# Patient Record
Sex: Female | Born: 1940 | Race: White | Hispanic: No | Smoking: Never smoker
Health system: Southern US, Community
[De-identification: ages and names within clinical notes are randomized; demographics above are authoritative.]

## PROBLEM LIST (undated history)

## (undated) DIAGNOSIS — E039 Hypothyroidism, unspecified: Secondary | ICD-10-CM

## (undated) DIAGNOSIS — I5032 Chronic diastolic (congestive) heart failure: Secondary | ICD-10-CM

## (undated) DIAGNOSIS — I272 Pulmonary hypertension, unspecified: Secondary | ICD-10-CM

## (undated) DIAGNOSIS — I2510 Atherosclerotic heart disease of native coronary artery without angina pectoris: Secondary | ICD-10-CM

## (undated) DIAGNOSIS — I350 Nonrheumatic aortic (valve) stenosis: Secondary | ICD-10-CM

## (undated) DIAGNOSIS — E1151 Type 2 diabetes mellitus with diabetic peripheral angiopathy without gangrene: Secondary | ICD-10-CM

## (undated) DIAGNOSIS — I700 Atherosclerosis of aorta: Secondary | ICD-10-CM

## (undated) DIAGNOSIS — I119 Hypertensive heart disease without heart failure: Secondary | ICD-10-CM

## (undated) DIAGNOSIS — I509 Heart failure, unspecified: Secondary | ICD-10-CM

## (undated) HISTORY — PX: DILATION AND CURETTAGE OF UTERUS: SHX78

## (undated) HISTORY — DX: Atherosclerosis of aorta: I70.0

## (undated) HISTORY — DX: Nonrheumatic aortic (valve) stenosis: I35.0

## (undated) HISTORY — PX: APPENDECTOMY: SHX54

## (undated) HISTORY — DX: Chronic diastolic (congestive) heart failure: I50.32

## (undated) HISTORY — DX: Hypertensive heart disease without heart failure: I11.9

## (undated) HISTORY — DX: Pulmonary hypertension, unspecified: I27.20

## (undated) HISTORY — DX: Hypothyroidism, unspecified: E03.9

## (undated) HISTORY — DX: Atherosclerotic heart disease of native coronary artery without angina pectoris: I25.10

## (undated) HISTORY — DX: Type 2 diabetes mellitus with diabetic peripheral angiopathy without gangrene: E11.51

## (undated) HISTORY — PX: ANKLE SURGERY: SHX546

---

## 2010-04-19 DIAGNOSIS — N183 Chronic kidney disease, stage 3 unspecified: Secondary | ICD-10-CM | POA: Insufficient documentation

## 2012-11-12 DIAGNOSIS — K219 Gastro-esophageal reflux disease without esophagitis: Secondary | ICD-10-CM | POA: Insufficient documentation

## 2014-05-20 DIAGNOSIS — I739 Peripheral vascular disease, unspecified: Secondary | ICD-10-CM | POA: Insufficient documentation

## 2014-06-05 DIAGNOSIS — E1159 Type 2 diabetes mellitus with other circulatory complications: Secondary | ICD-10-CM | POA: Insufficient documentation

## 2014-07-14 DIAGNOSIS — E1151 Type 2 diabetes mellitus with diabetic peripheral angiopathy without gangrene: Secondary | ICD-10-CM

## 2014-07-14 HISTORY — DX: Type 2 diabetes mellitus with diabetic peripheral angiopathy without gangrene: E11.51

## 2016-04-11 DIAGNOSIS — I872 Venous insufficiency (chronic) (peripheral): Secondary | ICD-10-CM | POA: Insufficient documentation

## 2016-05-17 DIAGNOSIS — I5189 Other ill-defined heart diseases: Secondary | ICD-10-CM | POA: Insufficient documentation

## 2016-05-17 DIAGNOSIS — I517 Cardiomegaly: Secondary | ICD-10-CM | POA: Insufficient documentation

## 2016-05-17 DIAGNOSIS — I342 Nonrheumatic mitral (valve) stenosis: Secondary | ICD-10-CM | POA: Insufficient documentation

## 2016-09-22 DIAGNOSIS — H2512 Age-related nuclear cataract, left eye: Secondary | ICD-10-CM | POA: Insufficient documentation

## 2017-07-04 DIAGNOSIS — I272 Pulmonary hypertension, unspecified: Secondary | ICD-10-CM | POA: Insufficient documentation

## 2017-07-04 HISTORY — DX: Pulmonary hypertension, unspecified: I27.20

## 2017-07-06 DIAGNOSIS — B369 Superficial mycosis, unspecified: Secondary | ICD-10-CM | POA: Insufficient documentation

## 2017-08-28 ENCOUNTER — Encounter: Payer: Self-pay | Admitting: Cardiology

## 2017-08-28 ENCOUNTER — Other Ambulatory Visit: Payer: Self-pay | Admitting: Cardiology

## 2017-08-28 ENCOUNTER — Ambulatory Visit
Admission: RE | Admit: 2017-08-28 | Discharge: 2017-08-28 | Disposition: A | Discharge status: Home / Self Care | Payer: Medicare Other | Source: Ambulatory Visit | Attending: Cardiology | Admitting: Cardiology

## 2017-08-28 DIAGNOSIS — I700 Atherosclerosis of aorta: Secondary | ICD-10-CM

## 2017-08-28 DIAGNOSIS — R0602 Shortness of breath: Secondary | ICD-10-CM

## 2017-08-28 DIAGNOSIS — I119 Hypertensive heart disease without heart failure: Secondary | ICD-10-CM

## 2017-08-28 DIAGNOSIS — I2510 Atherosclerotic heart disease of native coronary artery without angina pectoris: Secondary | ICD-10-CM

## 2017-08-28 DIAGNOSIS — I5032 Chronic diastolic (congestive) heart failure: Secondary | ICD-10-CM

## 2017-08-28 DIAGNOSIS — I350 Nonrheumatic aortic (valve) stenosis: Secondary | ICD-10-CM

## 2017-08-30 ENCOUNTER — Encounter: Payer: Self-pay | Admitting: Cardiology

## 2017-08-30 DIAGNOSIS — E039 Hypothyroidism, unspecified: Secondary | ICD-10-CM

## 2017-08-30 DIAGNOSIS — I350 Nonrheumatic aortic (valve) stenosis: Secondary | ICD-10-CM

## 2017-08-30 DIAGNOSIS — I119 Hypertensive heart disease without heart failure: Secondary | ICD-10-CM

## 2017-08-30 DIAGNOSIS — R0600 Dyspnea, unspecified: Secondary | ICD-10-CM | POA: Insufficient documentation

## 2017-08-30 DIAGNOSIS — I5032 Chronic diastolic (congestive) heart failure: Secondary | ICD-10-CM

## 2017-08-30 DIAGNOSIS — I2510 Atherosclerotic heart disease of native coronary artery without angina pectoris: Secondary | ICD-10-CM

## 2017-08-30 DIAGNOSIS — I700 Atherosclerosis of aorta: Secondary | ICD-10-CM

## 2017-08-30 HISTORY — DX: Hypertensive heart disease without heart failure: I11.9

## 2017-08-30 HISTORY — DX: Hypothyroidism, unspecified: E03.9

## 2017-08-30 HISTORY — DX: Atherosclerotic heart disease of native coronary artery without angina pectoris: I25.10

## 2017-08-30 HISTORY — DX: Atherosclerosis of aorta: I70.0

## 2017-08-30 HISTORY — DX: Chronic diastolic (congestive) heart failure: I50.32

## 2017-08-30 HISTORY — DX: Nonrheumatic aortic (valve) stenosis: I35.0

## 2017-08-30 NOTE — H&P (View-Only) (Signed)
Alisha [name] E  Date of visit:  08/28/2017 DOB:  06-12-1941    Age:  76 yrs. Medical record number:  [id number]     Account number:  [id number] Primary Care Provider: Neila Gear A ____________________________ CURRENT DIAGNOSES  1. Secondary pulmonary hypertension  2. Aortic valve stenosis  3. Atherosclerosis of aorta  4. Peripheral Vascular Disease, Unspecified  5. Hypertensive heart disease without heart failure  6. CAD Native without angina  7. Dyspnea  8. Type 2 diabetes mellitus without complications  9. Hypothyroidism  10. Chronic kidney disease, stage 3 (moderate)  11. Family history of ischemic heart disease and other diseases of the circulatory system ____________________________ ALLERGIES  No Known Drug Allergies ____________________________ MEDICATIONS  1. amlodipine 5 mg tablet, 1 p.o. daily  2. atorvastatin 20 mg tablet, 1 p.o. daily  3. carvedilol 12.5 mg tablet, BID  4. cetirizine 10 mg tablet, 1 p.o. daily  5. clopidogrel 75 mg tablet, 1 p.o. daily  6. fenofibrate 160 mg tablet, 1 p.o. daily  7. furosemide 20 mg tablet, 2 p.o. daily  8. glimepiride 4 mg tablet, 1 p.o. daily  9. levothyroxine 125 mcg tablet, 1 p.o. daily  10. losartan 100 mg tablet, 1 p.o. daily  11. metformin ER 500 mg 24 hr tablet,extended release, 1 to 2 tablets QD  12. pantoprazole 40 mg tablet,delayed release, 1 p.o. daily  13. PreserVision AREDS 7,160 unit-113 mg-100 unit tablet, 1 p.o. daily ____________________________ HISTORY OF PRESENT ILLNESS This very nice 76 year old female is seen for evaluation of severe dyspnea. The patient has a long-standing history of hypertension and diabetes. She has a history of chronic venous insufficiency and had a DVT of the left lower extremity treated with Coumadin in 1995. She developed a left ankle fracture requiring surgical fixation and then had the hardware removed. She developed ulcers on her left lateral ankle at the site of her previous  orthopedic procedure scar and had reasonable ABIs in 2015. She later because of poor wound healing and eventually had to have hyperbaric therapy and eventually had healing. A CT angiogram in October 2015 showed mild right SFA disease with a right anterior tibial patent but small vessels and evidence of emboli the posterior tibial at the ankle. She also had small vessel disease in her feet. She had some venous reflux disease with superficial reflux and eventually had endovascular therapy of the right greater saphenous vein in December 2017. Her wound ulcers eventually healed. She was able to mow her grass and do normal activities with walking about 2 years ago. The summer of 2017 she started developing severe dyspnea and was seen by a cardiologist in [place]. An echocardiogram at that time showed moderate pulmonary hypertension and a BNP level was 1400. She had a myocardial perfusion scan that showed no ischemia done with medicine. She has since been seen by the cardiology PA. She had a couple episodes of syncope. One occurred after she was walking around outside and when she became severely short of breath she then had a syncopal episode. She had a repeat echo done at that time as well as were any event monitor that didn't show any arrhythmias. She also had a repeat myocardial perfusion scan. She is continued to complain of dyspnea. Most recently in October she was coming back from an airport and had a syncopal episode accompanied with severe shortness of breath or she fell into a chair at the airport terminal. She since then has become severely short of breath with most  any level of activity and will get tightness across her upper chest. A relative of hers asked if I would see her and after reviewing the records and seeing her in consultation today. She is quite limited as what she can do and has gained about 10 pounds of weight and continues to have edema. She is on a small dose of furosemide. She  occasionally will have some orthopnea and again as noted above is severely dyspneic with most any of her exertional become dizzy and has to syncopal episode as noted above. On a most recent consultation by the cardiology PA a statement was made that she did not have any risk factors for CAD and that no cause for cardiac reason could be found for her dyspnea. ____________________________ PAST HISTORY  Past Medical Illnesses:  hypertension, DM-non-insulin dependent, hyperlipidemia, irritable bowel syndrome, peripheral vascular disease, anemia, chronic kidney disease Stage 3, diverticulitis;  Cardiovascular Illnesses:  no previous history of cardiac disease;  Infectious Diseases:  no previous history of significant infectious diseases;  Surgical Procedures:  appendectomy, D and C, tubal ligation, left ankle surgery;  Trauma History:  no previous history of significant trauma;  NYHA Classification:  I;  Cardiology Procedures-Invasive:  no previous interventional or invasive cardiology procedures;  Cardiology Procedures-Noninvasive:  echocardiogram 2018, lexiscan Myoview 2018;  Peripheral Vascular Procedures:  no previous invasive peripheral vascular procedures.;  LVEF not documented,   ____________________________ CARDIO-PULMONARY TEST DATES EKG Date:  08/28/2017;   ____________________________ FAMILY HISTORY Brother -- Heart Attack, Heart disease, Myocardial infarction, Brother alive with problem, Premature coronary heart disease Father -- Heart disease, Cancer, Myocardial infarction, Father dead Mother -- Diabetes mellitus, Mother dead Sister -- Sister alive with problem, Diabetes mellitus type 2 ____________________________ SOCIAL HISTORY Alcohol Use:  rare;  Smoking:  never smoked;  Diet:  regular diet;  Lifestyle:  divorced;  Exercise:  no regular exercise;  Occupation:  Cabin crew;  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 Eyes: cataract extraction bilaterally, macular hole Ears, Nose, Throat, Mouth:  partial hearing loss, hearing aides Respiratory: dyspnea with exertion Cardiovascular:  please review HPI Abdominal: diarrheaGenitourinary-Female: frequency Musculoskeletal:  ulcers on legs Neurological:  denies headaches, stroke, or TIA Psychiatric:  denies depression or anxiety Hematological/Immunologic:  denies any food allergies, bleeding disorders. ____________________________ PHYSICAL EXAMINATION VITAL SIGNS  Blood Pressure:  120/54 Sitting, Right arm, regular cuff  , 130/60 Standing, Right arm and regular cuff   Pulse:  84/min. Weight:  172.00 lbs. Height:  65.5"BMI: 28  Constitutional:  cooperative, alert and oriented,well developed, well nourished, in no acute distress. Skin:  warm and dry to touch, no apparent skin lesions, or masses noted. Head:  normocephalic, normal hair pattern, no masses or tenderness Eyes:  EOMS Intact, PERRLA, C and S clear, Funduscopic exam not done. ENT:  ears, nose and throat reveal no gross abnormalities.  Dentition good. Neck:  supple, without massess. No JVD, thyromegaly or carotid bruits. Carotid upstroke normal. Chest:  normal symmetry, clear to auscultation. Cardiac:  regular rhythm, normal S1 and S2, No S3 or S4, no murmurs, gallops or rubs detected. Abdomen:  abdomen soft,non-tender, no masses, no hepatospenomegaly, or aneurysm noted Peripheral Pulses:  the femoral,dorsalis pedis, and posterior tibial pulses are full and equal bilaterally with no bruits auscultated. Extremities & Back:  no deformities, clubbing, cyanosis, erythema or edema observed. Normal muscle strength and tone. Neurological:  no gross motor or sensory  deficits noted, affect appropriate, oriented x3. ____________________________ MOST RECENT LIPID PANEL 07/01/17  CHOL TOTL 111 mg/dl, LDL 47 NM, HDL 47 mg/dl, TRIGLYCER 83 mg/dl and VLDL  17 ____________________________ IMPRESSIONS/PLAN  1. Dyspnea is likely multifactorial and due partially to pulmonary hypertension of undetermined etiology at this time and could also be an ischemic equivalent in light of her multiple risk factors could have an element of diastolic heart failure could have valvular heart disease with aortic stenosis/mitral stenosis. 2. Aortic stenosis severity undetermined at this time but at least moderate 3. 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 Hypertensive heart disease 5. 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 History of nonhealing ulcers of lower extremities 7. Chronic venous insufficiency 8. Pulmonary hypertension undetermined etiology with evidence of trivial regurgitation volume overload 9. Sleep apnea 10. 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 Coronary artery disease as well as aortic atherosclerosis noted previously on imaging 12. Syncope of undetermined etiology that is exertional  Recommendations:  Echocardiogram was done today and shows at least moderate aortic stenosis. She has good LV function with moderate LVH and grade 2 diastolic dysfunction. Her ejection fraction was around 60-65%. Right ventricle is dilated and the right atrium is dilated and she has moderate pulmonary hypertension at least and has septal flattening suggesting it could be severe. She also had evidence of right atrial enlargement and right atrial pressure abnormality with neck vein distention.  I think her dyspnea is cardiac and am awaiting the results of the lab work drawn today. She is also to have a chest x-ray today. I recommended that she increase her furosemide to 40 mg twice daily and that she weigh daily. She should restrict her fluids to 2 L per day. I would like her to have a set of lower extremity arterial Dopplers. She likely will require  cardiac catheterization for further assessment. At this point is difficult to tell if the pulmonary hypertension is primary or secondary to either diastolic dysfunction, mitral valve disease or aortic stenosis. She also has evidence on previous vascular studies of aortic atherosclerosis as well as calcification in her coronaries so the extent of her CAD has not been determined yet.  ____________________________ TODAYS ORDERS  1. 2D, color flow, doppler: First Available  2. Comprehensive Metabolic Panel: Today  3. Complete Blood Count: Today  4. BNP: Today  5. TSH: Today  6. 12 Lead EKG: Today  7. Chest X-ray PA/Lat: today  8. Return Visit: 2 weeks  9. CHEST XRAY: Today  10. Bil L.E. Arterial Duplex: First Available  11. ROV 2 weeks                       ____________________________ Cardiology Physician:  Kerry Hough MD [facility]

## 2017-08-30 NOTE — Consult Note (Signed)
Alisha [name]  Date of visit:  08/28/2017 DOB:  06-28-1941    Age:  76 yrs. Medical record number:  [id number]     Account number:  [id number] Primary Care Provider: Neila Gear A ____________________________ CURRENT DIAGNOSES  1. Secondary pulmonary hypertension  2. Aortic valve stenosis  3. Atherosclerosis of aorta  4. Peripheral Vascular Disease, Unspecified  5. Hypertensive heart disease without heart failure  6. CAD Native without angina  7. Dyspnea  8. Type 2 diabetes mellitus without complications  9. Hypothyroidism  10. Chronic kidney disease, stage 3 (moderate)  11. Family history of ischemic heart disease and other diseases of the circulatory system ____________________________ ALLERGIES  No Known Drug Allergies ____________________________ MEDICATIONS  1. amlodipine 5 mg tablet, 1 p.o. daily  2. atorvastatin 20 mg tablet, 1 p.o. daily  3. carvedilol 12.5 mg tablet, BID  4. cetirizine 10 mg tablet, 1 p.o. daily  5. clopidogrel 75 mg tablet, 1 p.o. daily  6. fenofibrate 160 mg tablet, 1 p.o. daily  7. furosemide 20 mg tablet, 2 p.o. daily  8. glimepiride 4 mg tablet, 1 p.o. daily  9. levothyroxine 125 mcg tablet, 1 p.o. daily  10. losartan 100 mg tablet, 1 p.o. daily  11. metformin ER 500 mg 24 hr tablet,extended release, 1 to 2 tablets QD  12. pantoprazole 40 mg tablet,delayed release, 1 p.o. daily  13. PreserVision AREDS 7,160 unit-113 mg-100 unit tablet, 1 p.o. daily ____________________________ HISTORY OF PRESENT ILLNESS This very nice 76 year old female is seen for evaluation of severe dyspnea. The patient has a long-standing history of hypertension and diabetes. She has a history of chronic venous insufficiency and had a DVT of the left lower extremity treated with Coumadin in 1995. She developed a left ankle fracture requiring surgical fixation and then had the hardware removed. She developed ulcers on her left lateral ankle at the site of her previous  orthopedic procedure scar and had reasonable ABIs in 2015. She later because of poor wound healing and eventually had to have hyperbaric therapy and eventually had healing. A CT angiogram in October 2015 showed mild right SFA disease with a right anterior tibial patent but small vessels and evidence of emboli the posterior tibial at the ankle. She also had small vessel disease in her feet. She had some venous reflux disease with superficial reflux and eventually had endovascular therapy of the right greater saphenous vein in December 2017. Her wound ulcers eventually healed. She was able to mow her grass and do normal activities with walking about 2 years ago. The summer of 2017 she started developing severe dyspnea and was seen by a cardiologist in [place]. An echocardiogram at that time showed moderate pulmonary hypertension and a BNP level was 1400. She had a myocardial perfusion scan that showed no ischemia done with medicine. She has since been seen by the cardiology PA. She had a couple episodes of syncope. One occurred after she was walking around outside and when she became severely short of breath she then had a syncopal episode. She had a repeat echo done at that time as well as were any event monitor that didn't show any arrhythmias. She also had a repeat myocardial perfusion scan. She is continued to complain of dyspnea. Most recently in October she was coming back from an airport and had a syncopal episode accompanied with severe shortness of breath or she fell into a chair at the airport terminal. She since then has become severely short of breath with most  any level of activity and will get tightness across her upper chest. A relative of hers asked if I would see her and after reviewing the records and seeing her in consultation today. She is quite limited as what she can do and has gained about 10 pounds of weight and continues to have edema. She is on a small dose of furosemide. She  occasionally will have some orthopnea and again as noted above is severely dyspneic with most any of her exertional become dizzy and has to syncopal episode as noted above. On a most recent consultation by the cardiology PA a statement was made that she did not have any risk factors for CAD and that no cause for cardiac reason could be found for her dyspnea. ____________________________ PAST HISTORY  Past Medical Illnesses:  hypertension, DM-non-insulin dependent, hyperlipidemia, irritable bowel syndrome, peripheral vascular disease, anemia, chronic kidney disease Stage 3, diverticulitis;  Cardiovascular Illnesses:  no previous history of cardiac disease;  Infectious Diseases:  no previous history of significant infectious diseases;  Surgical Procedures:  appendectomy, D and C, tubal ligation, left ankle surgery;  Trauma History:  no previous history of significant trauma;  NYHA Classification:  I;  Cardiology Procedures-Invasive:  no previous interventional or invasive cardiology procedures;  Cardiology Procedures-Noninvasive:  echocardiogram 2018, lexiscan Myoview 2018;  Peripheral Vascular Procedures:  no previous invasive peripheral vascular procedures.;  LVEF not documented,   ____________________________ CARDIO-PULMONARY TEST DATES EKG Date:  08/28/2017;   ____________________________ FAMILY HISTORY Brother -- Heart Attack, Heart disease, Myocardial infarction, Brother alive with problem, Premature coronary heart disease Father -- Heart disease, Cancer, Myocardial infarction, Father dead Mother -- Diabetes mellitus, Mother dead Sister -- Sister alive with problem, Diabetes mellitus type 2 ____________________________ SOCIAL HISTORY Alcohol Use:  rare;  Smoking:  never smoked;  Diet:  regular diet;  Lifestyle:  divorced;  Exercise:  no regular exercise;  Occupation:  Cabin crew;  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 Eyes: cataract extraction bilaterally, macular hole Ears, Nose, Throat, Mouth:  partial hearing loss, hearing aides Respiratory: dyspnea with exertion Cardiovascular:  please review HPI Abdominal: diarrheaGenitourinary-Female: frequency Musculoskeletal:  ulcers on legs Neurological:  denies headaches, stroke, or TIA Psychiatric:  denies depression or anxiety Hematological/Immunologic:  denies any food allergies, bleeding disorders. ____________________________ PHYSICAL EXAMINATION VITAL SIGNS  Blood Pressure:  120/54 Sitting, Right arm, regular cuff  , 130/60 Standing, Right arm and regular cuff   Pulse:  84/min. Weight:  172.00 lbs. Height:  65.5"BMI: 28  Constitutional:  cooperative, alert and oriented,well developed, well nourished, in no acute distress. Skin:  warm and dry to touch, no apparent skin lesions, or masses noted. Head:  normocephalic, normal hair pattern, no masses or tenderness Eyes:  EOMS Intact, PERRLA, C and S clear, Funduscopic exam not done. ENT:  ears, nose and throat reveal no gross abnormalities.  Dentition good. Neck:  supple, without massess. No JVD, thyromegaly or carotid bruits. Carotid upstroke normal. Chest:  normal symmetry, clear to auscultation. Cardiac:  regular rhythm, normal S1 and S2, No S3 or S4, no murmurs, gallops or rubs detected. Abdomen:  abdomen soft,non-tender, no masses, no hepatospenomegaly, or aneurysm noted Peripheral Pulses:  the femoral,dorsalis pedis, and posterior tibial pulses are full and equal bilaterally with no bruits auscultated. Extremities & Back:  no deformities, clubbing, cyanosis, erythema or edema observed. Normal muscle strength and tone. Neurological:  no gross motor or sensory  deficits noted, affect appropriate, oriented x3. ____________________________ MOST RECENT LIPID PANEL 07/01/17  CHOL TOTL 111 mg/dl, LDL 47 NM, HDL 47 mg/dl, TRIGLYCER 83 mg/dl and VLDL  17 ____________________________ IMPRESSIONS/PLAN  1. Dyspnea is likely multifactorial and due partially to pulmonary hypertension of undetermined etiology at this time and could also be an ischemic equivalent in light of her multiple risk factors could have an element of diastolic heart failure could have valvular heart disease with aortic stenosis/mitral stenosis. 2. Aortic stenosis severity undetermined at this time but at least moderate 3. 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 Hypertensive heart disease 5. 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 History of nonhealing ulcers of lower extremities 7. Chronic venous insufficiency 8. Pulmonary hypertension undetermined etiology with evidence of trivial regurgitation volume overload 9. Sleep apnea 10. 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 Coronary artery disease as well as aortic atherosclerosis noted previously on imaging 12. Syncope of undetermined etiology that is exertional  Recommendations:  Echocardiogram was done today and shows at least moderate aortic stenosis. She has good LV function with moderate LVH and grade 2 diastolic dysfunction. Her ejection fraction was around 60-65%. Right ventricle is dilated and the right atrium is dilated and she has moderate pulmonary hypertension at least and has septal flattening suggesting it could be severe. She also had evidence of right atrial enlargement and right atrial pressure abnormality with neck vein distention.  I think her dyspnea is cardiac and am awaiting the results of the lab work drawn today. She is also to have a chest x-ray today. I recommended that she increase her furosemide to 40 mg twice daily and that she weigh daily. She should restrict her fluids to 2 L per day. I would like her to have a set of lower extremity arterial Dopplers. She likely will require  cardiac catheterization for further assessment. At this point is difficult to tell if the pulmonary hypertension is primary or secondary to either diastolic dysfunction, mitral valve disease or aortic stenosis. She also has evidence on previous vascular studies of aortic atherosclerosis as well as calcification in her coronaries so the extent of her CAD has not been determined yet.  ____________________________ TODAYS ORDERS  1. 2D, color flow, doppler: First Available  2. Comprehensive Metabolic Panel: Today  3. Complete Blood Count: Today  4. BNP: Today  5. TSH: Today  6. 12 Lead EKG: Today  7. Chest X-ray PA/Lat: today  8. Return Visit: 2 weeks  9. CHEST XRAY: Today  10. Bil L.[name]. Arterial Duplex: First Available  11. ROV 2 weeks                       ____________________________ Cardiology Physician:  Kerry Hough MD [facility]

## 2017-09-13 NOTE — Progress Notes (Signed)
Alisha [name] E  Date of visit:  09/13/2017 DOB:  1940-10-14    Age:  77 yrs. Medical record number:  [id number]     Account number:  [id number] Primary Care Provider: Neila Gear A ____________________________ CURRENT DIAGNOSES  1. Secondary pulmonary hypertension  2. Aortic valve stenosis  3. Atherosclerosis of aorta  4. Peripheral Vascular Disease, Unspecified  5. Hypertensive heart disease without heart failure  6. CAD Native without angina  7. Dyspnea  8. Type 2 diabetes mellitus without complications  9. Hypothyroidism  10. Chronic kidney disease, stage 3 (moderate)  11. Family history of ischemic heart disease and other diseases of the circulatory system ____________________________ ALLERGIES  No Known Drug Allergies ____________________________ MEDICATIONS  1. amlodipine 5 mg tablet, 1 p.o. daily  2. atorvastatin 20 mg tablet, 1 p.o. daily  3. carvedilol 12.5 mg tablet, BID  4. cetirizine 10 mg tablet, 1 p.o. daily  5. clopidogrel 75 mg tablet, 1 p.o. daily  6. fenofibrate 160 mg tablet, 1 p.o. daily  7. furosemide 40 mg tablet, BID  8. glimepiride 4 mg tablet, 1 p.o. daily  9. levothyroxine 125 mcg tablet, 1 p.o. daily  10. losartan 100 mg tablet, 1 p.o. daily  11. metformin ER 500 mg 24 hr tablet,extended release, 1 to 2 tablets QD  12. pantoprazole 40 mg tablet,delayed release, 1 p.o. daily  13. PreserVision AREDS 7,160 unit-113 mg-100 unit tablet, 1 p.o. daily ____________________________ HISTORY OF PRESENT ILLNESS Patient returns for cardiac followup. She is still dyspneic with most any level of exertion plains of some chest tightness across her anterior chest. She has lost 10 pounds of weight and her edema has improved. As noted previously her echo showed at least moderate aortic stenosis and there was a question about mitral stenosis. She had evidence of right ventricular enlargement as well as what significant pulmonary hypertension. She has not yet had a set of  lower extremity Dopplers. I spoke with her case with Dr. Sherren Mocha agreeable to take her to the catheterization laboratory for further assessment. She is no longer having orthopnea. She has no PND. ____________________________ PAST HISTORY  Past Medical Illnesses:  hypertension, DM-non-insulin dependent, hyperlipidemia, irritable bowel syndrome, peripheral vascular disease, anemia, chronic kidney disease Stage 3, diverticulitis;  Cardiovascular Illnesses:  no previous history of cardiac disease;  Infectious Diseases:  no previous history of significant infectious diseases;  Surgical Procedures:  appendectomy, D and C, tubal ligation, left ankle surgery;  Trauma History:  no previous history of significant trauma;  NYHA Classification:  I;  Canadian Angina Classification:  Class 0: Asymptomatic;  Cardiology Procedures-Invasive:  no previous interventional or invasive cardiology procedures;  Cardiology Procedures-Noninvasive:  echocardiogram 2018, lexiscan Myoview 2018;  Peripheral Vascular Procedures:  CT scan of aorta;  LVEF not documented,   ____________________________ CARDIO-PULMONARY TEST DATES EKG Date:  08/28/2017;   ____________________________ FAMILY HISTORY Brother -- Heart Attack, Heart disease, Myocardial infarction, Brother alive with problem, Premature coronary heart disease Father -- Heart disease, Cancer, Myocardial infarction, Father dead Mother -- Diabetes mellitus, Mother dead Sister -- Sister alive with problem, Diabetes mellitus type 2 ____________________________ SOCIAL HISTORY Alcohol Use:  rare;  Smoking:  never smoked;  Diet:  regular diet;  Lifestyle:  divorced;  Exercise:  no regular exercise;  Occupation:  Cabin crew;  Residence:  lives alone;   ____________________________ REVIEW OF SYSTEMS General:  malaise and fatigue, weight loss of approximately 10 lbs Eyes: cataract extraction bilaterally, macular hole Ears, Nose, Throat, Mouth:  partial hearing loss,  hearing  aides Respiratory: dyspnea with exertion Cardiovascular:  please review HPI Abdominal: denies dyspepsia, GI bleeding, constipation, or diarrheaGenitourinary-Female: frequency Musculoskeletal:  ulcers on legs Neurological:  denies headaches, stroke, or TIA Psychiatric:  denies depression or anxiety Hematological/Immunologic:  denies any food allergies, bleeding disorders. ____________________________ PHYSICAL EXAMINATION VITAL SIGNS  Blood Pressure:  112/56 Sitting, Left arm, regular cuff  , 116/52 Standing, Left arm and regular cuff   Pulse:  82/min. Weight:  162.00 lbs. Height:  65.50"BMI: 26  Constitutional:  pleasant white female, in no acute distress Skin:  warm and dry to touch, no apparent skin lesions, or masses noted. Head:  normocephalic, normal hair pattern, no masses or tenderness Eyes:  EOMS Intact, PERRLA, C and S clear, Funduscopic exam not done. ENT:  ears, nose and throat reveal no gross abnormalities.  Dentition good. Neck:  supple, without massess. No JVD, thyromegaly or carotid bruits. Carotid upstroke normal. Chest:  normal symmetry, clear to auscultation. Cardiac:  irregular rhythm, normal S1 and S2, grade 2/6 systolic murmur Abdomen:  abdomen soft,non-tender, no masses, no hepatospenomegaly, or aneurysm noted Peripheral Pulses:  the femoral,dorsalis pedis, and posterior tibial pulses are full and equal bilaterally with no bruits auscultated. Extremities & Back:  no spinal abnormalities noted., normal muscle strength and tone., 1+ edema Neurological:  no gross motor or sensory deficits noted, affect appropriate, oriented x3. ____________________________ MOST RECENT LIPID PANEL 07/01/17  CHOL TOTL 111 mg/dl, LDL 47 NM, HDL 47 mg/dl, TRIGLYCER 83 mg/dl and VLDL 17 ____________________________ IMPRESSIONS/PLAN  1. Severe dyspnea on exertion with evidence of pulmonary hypertension 2. Aortic stenosis at least moderate 3. Severe mitral annular calcification with possible  mitral stenosis 4. CAD as manifested by coronary calcification previous CT scan 5. Diabetes mellitus with vascular disease 6. Hypertension  Recommendations:  The patient is agreeable to catheterization.Cardiac catheterization was discussed with the patient including risks of myocardial infarction, death, stroke, bleeding, arrhythmia, dye allergy, or renal insufficiency. She understands and is willing to proceed. She has a previous history of an allergy to metal and does not wish to consider coronary stenting which I think is agreeable so she is just going to be set up for a right and left heart catheterization. We will have her hold her ARB the day prior to catheterization and her metformin and diuretics the day of the catheterization. Followup after the cardiac catheterization about further plans.  ____________________________ TODAYS ORDERS  1. Draw PT/INR: Today  2. Comprehensive Metabolic Panel: Today  3. Complete Blood Count: Today  4. PTT: Today  5. BNP: Today  6. Right and Left Heart Cath: First Available                       ____________________________ Cardiology Physician:  Kerry Hough MD [facility]

## 2017-09-27 ENCOUNTER — Ambulatory Visit (HOSPITAL_COMMUNITY)
Admission: RE | Disposition: A | Discharge status: Home / Self Care | Payer: Self-pay | Source: Ambulatory Visit | Attending: Cardiovascular Disease

## 2017-09-27 ENCOUNTER — Ambulatory Visit (HOSPITAL_COMMUNITY)
Admission: RE | Admit: 2017-09-27 | Discharge: 2017-09-27 | Disposition: A | Discharge status: Home / Self Care | Payer: Medicare Other | Source: Ambulatory Visit | Attending: Cardiovascular Disease | Admitting: Cardiovascular Disease

## 2017-09-27 DIAGNOSIS — I50810 Right heart failure, unspecified: Secondary | ICD-10-CM | POA: Diagnosis not present

## 2017-09-27 DIAGNOSIS — I132 Hypertensive heart and chronic kidney disease with heart failure and with stage 5 chronic kidney disease, or end stage renal disease: Secondary | ICD-10-CM | POA: Diagnosis not present

## 2017-09-27 DIAGNOSIS — E1151 Type 2 diabetes mellitus with diabetic peripheral angiopathy without gangrene: Secondary | ICD-10-CM | POA: Insufficient documentation

## 2017-09-27 DIAGNOSIS — I272 Pulmonary hypertension, unspecified: Secondary | ICD-10-CM | POA: Diagnosis present

## 2017-09-27 DIAGNOSIS — I700 Atherosclerosis of aorta: Secondary | ICD-10-CM | POA: Diagnosis not present

## 2017-09-27 DIAGNOSIS — R0600 Dyspnea, unspecified: Secondary | ICD-10-CM | POA: Diagnosis not present

## 2017-09-27 DIAGNOSIS — I872 Venous insufficiency (chronic) (peripheral): Secondary | ICD-10-CM | POA: Insufficient documentation

## 2017-09-27 DIAGNOSIS — I2510 Atherosclerotic heart disease of native coronary artery without angina pectoris: Secondary | ICD-10-CM | POA: Diagnosis not present

## 2017-09-27 DIAGNOSIS — E1122 Type 2 diabetes mellitus with diabetic chronic kidney disease: Secondary | ICD-10-CM | POA: Diagnosis not present

## 2017-09-27 DIAGNOSIS — N183 Chronic kidney disease, stage 3 (moderate): Secondary | ICD-10-CM | POA: Diagnosis not present

## 2017-09-27 DIAGNOSIS — Z79890 Hormone replacement therapy (postmenopausal): Secondary | ICD-10-CM | POA: Diagnosis not present

## 2017-09-27 DIAGNOSIS — E039 Hypothyroidism, unspecified: Secondary | ICD-10-CM | POA: Diagnosis not present

## 2017-09-27 DIAGNOSIS — I5030 Unspecified diastolic (congestive) heart failure: Secondary | ICD-10-CM | POA: Insufficient documentation

## 2017-09-27 DIAGNOSIS — Z86718 Personal history of other venous thrombosis and embolism: Secondary | ICD-10-CM | POA: Diagnosis not present

## 2017-09-27 DIAGNOSIS — Z7984 Long term (current) use of oral hypoglycemic drugs: Secondary | ICD-10-CM | POA: Diagnosis not present

## 2017-09-27 DIAGNOSIS — Z8249 Family history of ischemic heart disease and other diseases of the circulatory system: Secondary | ICD-10-CM | POA: Insufficient documentation

## 2017-09-27 DIAGNOSIS — G4730 Sleep apnea, unspecified: Secondary | ICD-10-CM | POA: Diagnosis not present

## 2017-09-27 DIAGNOSIS — E1142 Type 2 diabetes mellitus with diabetic polyneuropathy: Secondary | ICD-10-CM | POA: Diagnosis not present

## 2017-09-27 DIAGNOSIS — Z79899 Other long term (current) drug therapy: Secondary | ICD-10-CM | POA: Insufficient documentation

## 2017-09-27 DIAGNOSIS — I080 Rheumatic disorders of both mitral and aortic valves: Secondary | ICD-10-CM | POA: Insufficient documentation

## 2017-09-27 DIAGNOSIS — I1310 Hypertensive heart and chronic kidney disease without heart failure, with stage 1 through stage 4 chronic kidney disease, or unspecified chronic kidney disease: Secondary | ICD-10-CM | POA: Diagnosis not present

## 2017-09-27 DIAGNOSIS — I350 Nonrheumatic aortic (valve) stenosis: Secondary | ICD-10-CM

## 2017-09-27 DIAGNOSIS — R55 Syncope and collapse: Secondary | ICD-10-CM | POA: Insufficient documentation

## 2017-09-27 HISTORY — PX: RIGHT/LEFT HEART CATH AND CORONARY ANGIOGRAPHY: CATH118266

## 2017-09-27 LAB — POCT I-STAT 3, VENOUS BLOOD GAS (G3P V)
ACID-BASE DEFICIT: 5 mmol/L — AB (ref 0.0–2.0)
Acid-base deficit: 4 mmol/L — ABNORMAL HIGH (ref 0.0–2.0)
Acid-base deficit: 5 mmol/L — ABNORMAL HIGH (ref 0.0–2.0)
Bicarbonate: 19.4 mmol/L — ABNORMAL LOW (ref 20.0–28.0)
Bicarbonate: 20.1 mmol/L (ref 20.0–28.0)
Bicarbonate: 20.5 mmol/L (ref 20.0–28.0)
O2 Saturation: 50 %
O2 Saturation: 53 %
O2 Saturation: 66 %
PCO2 VEN: 34.4 mmHg — AB (ref 44.0–60.0)
PCO2 VEN: 35.2 mmHg — AB (ref 44.0–60.0)
PCO2 VEN: 36.2 mmHg — AB (ref 44.0–60.0)
PH VEN: 7.359 (ref 7.250–7.430)
PH VEN: 7.361 (ref 7.250–7.430)
PO2 VEN: 28 mmHg — AB (ref 32.0–45.0)
PO2 VEN: 35 mmHg (ref 32.0–45.0)
TCO2: 20 mmol/L — ABNORMAL LOW (ref 22–32)
TCO2: 21 mmol/L — ABNORMAL LOW (ref 22–32)
TCO2: 22 mmol/L (ref 22–32)
pH, Ven: 7.364 (ref 7.250–7.430)
pO2, Ven: 29 mmHg — CL (ref 32.0–45.0)

## 2017-09-27 LAB — POCT I-STAT 3, ART BLOOD GAS (G3+)
ACID-BASE DEFICIT: 5 mmol/L — AB (ref 0.0–2.0)
Bicarbonate: 19 mmol/L — ABNORMAL LOW (ref 20.0–28.0)
O2 Saturation: 93 %
PH ART: 7.378 (ref 7.350–7.450)
TCO2: 20 mmol/L — ABNORMAL LOW (ref 22–32)
pCO2 arterial: 32.4 mmHg (ref 32.0–48.0)
pO2, Arterial: 69 mmHg — ABNORMAL LOW (ref 83.0–108.0)

## 2017-09-27 LAB — GLUCOSE, CAPILLARY: Glucose-Capillary: 137 mg/dL — ABNORMAL HIGH (ref 65–99)

## 2017-09-27 SURGERY — RIGHT/LEFT HEART CATH AND CORONARY ANGIOGRAPHY
Anesthesia: LOCAL

## 2017-09-27 MED ORDER — SODIUM CHLORIDE 0.9% FLUSH: 3.0000 mL | Freq: Two times a day (BID) | INTRAVENOUS | Status: DC

## 2017-09-27 MED ORDER — LIDOCAINE HCL (PF) 1 % IJ SOLN
INTRAMUSCULAR | Status: AC
  Filled 2017-09-27: qty 30

## 2017-09-27 MED ORDER — LIDOCAINE HCL (PF) 1 % IJ SOLN
INTRAMUSCULAR | Status: DC | PRN
  Administered 2017-09-27: 20 mL

## 2017-09-27 MED ORDER — SODIUM CHLORIDE 0.9 % IV SOLN: 250.0000 mL | INTRAVENOUS | Status: DC | PRN

## 2017-09-27 MED ORDER — FENTANYL CITRATE (PF) 100 MCG/2ML IJ SOLN
INTRAMUSCULAR | Status: AC
  Filled 2017-09-27: qty 2

## 2017-09-27 MED ORDER — HEPARIN (PORCINE) IN NACL 2-0.9 UNIT/ML-% IJ SOLN
INTRAMUSCULAR | Status: AC | PRN
  Administered 2017-09-27: 1000 mL

## 2017-09-27 MED ORDER — IOPAMIDOL (ISOVUE-370) INJECTION 76%
INTRAVENOUS | Status: AC
  Filled 2017-09-27: qty 100

## 2017-09-27 MED ORDER — MIDAZOLAM HCL 2 MG/2ML IJ SOLN
INTRAMUSCULAR | Status: AC
  Filled 2017-09-27: qty 2

## 2017-09-27 MED ORDER — SODIUM CHLORIDE 0.9 % WEIGHT BASED INFUSION
3.0000 mL/kg/h | INTRAVENOUS | Status: AC
  Administered 2017-09-27: 3 mL/kg/h via INTRAVENOUS

## 2017-09-27 MED ORDER — MIDAZOLAM HCL 2 MG/2ML IJ SOLN
INTRAMUSCULAR | Status: DC | PRN
  Administered 2017-09-27: 1 mg via INTRAVENOUS

## 2017-09-27 MED ORDER — VERAPAMIL HCL 2.5 MG/ML IV SOLN
INTRAVENOUS | Status: AC
  Filled 2017-09-27: qty 2

## 2017-09-27 MED ORDER — ASPIRIN 81 MG PO CHEW
CHEWABLE_TABLET | ORAL | Status: AC
Start: 2017-09-27 — End: 2017-09-27
  Administered 2017-09-27: 81 mg via ORAL
  Filled 2017-09-27: qty 1

## 2017-09-27 MED ORDER — IOPAMIDOL (ISOVUE-370) INJECTION 76%
INTRAVENOUS | Status: DC | PRN
  Administered 2017-09-27: 35 mL via INTRA_ARTERIAL

## 2017-09-27 MED ORDER — HEPARIN (PORCINE) IN NACL 2-0.9 UNIT/ML-% IJ SOLN
INTRAMUSCULAR | Status: AC
  Filled 2017-09-27: qty 1000

## 2017-09-27 MED ORDER — FENTANYL CITRATE (PF) 100 MCG/2ML IJ SOLN
INTRAMUSCULAR | Status: DC | PRN
  Administered 2017-09-27: 50 ug via INTRAVENOUS

## 2017-09-27 MED ORDER — SODIUM CHLORIDE 0.9 % WEIGHT BASED INFUSION: 1.0000 mL/kg/h | INTRAVENOUS | Status: DC

## 2017-09-27 MED ORDER — ASPIRIN 81 MG PO CHEW
81.0000 mg | CHEWABLE_TABLET | ORAL | Status: AC
  Administered 2017-09-27: 81 mg via ORAL

## 2017-09-27 MED ORDER — SODIUM CHLORIDE 0.9% FLUSH: 3.0000 mL | INTRAVENOUS | Status: DC | PRN

## 2017-09-27 MED ORDER — SODIUM CHLORIDE 0.9 % IV SOLN
INTRAVENOUS | Status: DC
  Administered 2017-09-27: 17:00:00 via INTRAVENOUS

## 2017-09-27 SURGICAL SUPPLY — 17 items
CATH 5FR JL3.5 JR4 ANG PIG MP (CATHETERS) ×2 IMPLANT
CATH SWAN GANZ 7F STRAIGHT (CATHETERS) ×2 IMPLANT
COVER PRB 48X5XTLSCP FOLD TPE (BAG) ×1 IMPLANT
COVER PROBE 5X48 (BAG) ×1
GLIDESHEATH SLEND SS 6F .021 (SHEATH) IMPLANT
GUIDEWIRE INQWIRE 1.5J.035X260 (WIRE) IMPLANT
INQWIRE 1.5J .035X260CM (WIRE)
KIT HEART LEFT (KITS) ×2 IMPLANT
KIT MICROINTRODUCER STIFF 5F (SHEATH) ×2 IMPLANT
PACK CARDIAC CATHETERIZATION (CUSTOM PROCEDURE TRAY) ×2 IMPLANT
SHEATH PINNACLE 5F 10CM (SHEATH) ×2 IMPLANT
SHEATH PINNACLE 7F 10CM (SHEATH) ×2 IMPLANT
SYR MEDRAD MARK V 150ML (SYRINGE) ×2 IMPLANT
TRANSDUCER W/STOPCOCK (MISCELLANEOUS) ×2 IMPLANT
TUBING ART PRESS 72  MALE/FEM (TUBING) ×1
TUBING ART PRESS 72 MALE/FEM (TUBING) ×1 IMPLANT
TUBING CIL FLEX 10 FLL-RA (TUBING) ×2 IMPLANT

## 2017-09-27 NOTE — Progress Notes (Signed)
Report given to Guerry Minors She assumed care at this time

## 2017-09-27 NOTE — Interval H&P Note (Signed)
History and Physical Interval Note:  09/27/2017 2:59 PM  Alisha [name]  has presented today for surgery, with the diagnosis of unstable angina, as  The various methods of treatment have been discussed with the patient and family. After consideration of risks, benefits and other options for treatment, the patient has consented to  Procedure(s): RIGHT/LEFT HEART CATH AND CORONARY ANGIOGRAPHY (N/A) as a surgical intervention .  The patient's history has been reviewed, patient examined, no change in status, stable for surgery.  I have reviewed the patient's chart and labs.  Questions were answered to the patient's satisfaction.     Sherren Mocha

## 2017-09-27 NOTE — Research (Signed)
CADFEM Informed Consent   Subject Name: Alisha [name]  Subject met inclusion and exclusion criteria.  The informed consent form, study requirements and expectations were reviewed with the subject and questions and concerns were addressed prior to the signing of the consent form.  The subject verbalized understanding of the trail requirements.  The subject agreed to participate in the CADFEM trial and signed the informed consent.  The informed consent was obtained prior to performance of any protocol-specific procedures for the subject.  A copy of the signed informed consent was given to the subject and a copy was placed in the subject's medical record.  Christena Flake 09/27/2017, 12:50 PM

## 2017-09-27 NOTE — Discharge Instructions (Signed)
Moderate Conscious Sedation, Adult, Care After °These instructions provide you with information about caring for yourself after your procedure. Your health care provider may also give you more specific instructions. Your treatment has been planned according to current medical practices, but problems sometimes occur. Call your health care provider if you have any problems or questions after your procedure. °What can I expect after the procedure? °After your procedure, it is common: °· To feel sleepy for several hours. °· To feel clumsy and have poor balance for several hours. °· To have poor judgment for several hours. °· To vomit if you eat too soon. ° °Follow these instructions at home: °For at least 24 hours after the procedure: ° °· Do not: °? Participate in activities where you could fall or become injured. °? Drive. °? Use heavy machinery. °? Drink alcohol. °? Take sleeping pills or medicines that cause drowsiness. °? Make important decisions or sign legal documents. °? Take care of children on your own. °· Rest. °Eating and drinking °· Follow the diet recommended by your health care provider. °· If you vomit: °? Drink water, juice, or soup when you can drink without vomiting. °? Make sure you have little or no nausea before eating solid foods. °General instructions °· Have a responsible adult stay with you until you are awake and alert. °· Take over-the-counter and prescription medicines only as told by your health care provider. °· If you smoke, do not smoke without supervision. °· Keep all follow-up visits as told by your health care provider. This is important. °Contact a health care provider if: °· You keep feeling nauseous or you keep vomiting. °· You feel light-headed. °· You develop a rash. °· You have a fever. °Get help right away if: °· You have trouble breathing. °This information is not intended to replace advice given to you by your health care provider. Make sure you discuss any questions you have  with your health care provider. °Document Released: 06/19/2013 Document Revised: 02/01/2016 Document Reviewed: 12/19/2015 °Elsevier Interactive Patient Education © 2018 [employer] °Angiogram, Care After °This sheet gives you information about how to care for yourself after your procedure. Your health care provider may also give you more specific instructions. If you have problems or questions, contact your health care provider. °What can I expect after the procedure? °After the procedure, it is common to have bruising and tenderness at the catheter insertion area. °Follow these instructions at home: °Insertion site care °· Follow instructions from your health care provider about how to take care of your insertion site. Make sure you: °? Wash your hands with soap and water before you change your bandage (dressing). If soap and water are not available, use hand sanitizer. °? Change your dressing as told by your health care provider. °? Leave stitches (sutures), skin glue, or adhesive strips in place. These skin closures may need to stay in place for 2 weeks or longer. If adhesive strip edges start to loosen and curl up, you may trim the loose edges. Do not remove adhesive strips completely unless your health care provider tells you to do that. °· Do not take baths, swim, or use a hot tub until your health care provider approves. °· You may shower 24-48 hours after the procedure or as told by your health care provider. °? Gently wash the site with plain soap and water. °? Pat the area dry with a clean towel. °? Do not rub the site. This may cause bleeding. °· Do   not apply powder or lotion to the site. Keep the site clean and dry. °· Check your insertion site every day for signs of infection. Check for: °? Redness, swelling, or pain. °? Fluid or blood. °? Warmth. °? Pus or a bad smell. °Activity °· Rest as told by your health care provider, usually for 1-2 days. °· Do not lift anything that is heavier than 10 lbs.  (4.5 kg) or as told by your health care provider. °· Do not drive for 24 hours if you were given a medicine to help you relax (sedative). °· Do not drive or use heavy machinery while taking prescription pain medicine. °General instructions °· Return to your normal activities as told by your health care provider, usually in about a week. Ask your health care provider what activities are safe for you. °· If the catheter site starts bleeding, lie flat and put pressure on the site. If the bleeding does not stop, get help right away. This is a medical emergency. °· Drink enough fluid to keep your urine clear or pale yellow. This helps flush the contrast dye from your body. °· Take over-the-counter and prescription medicines only as told by your health care provider. °· Keep all follow-up visits as told by your health care provider. This is important. °Contact a health care provider if: °· You have a fever or chills. °· You have redness, swelling, or pain around your insertion site. °· You have fluid or blood coming from your insertion site. °· The insertion site feels warm to the touch. °· You have pus or a bad smell coming from your insertion site. °· You have bruising around the insertion site. °· You notice blood collecting in the tissue around the catheter site (hematoma). The hematoma may be painful to the touch. °Get help right away if: °· You have severe pain at the catheter insertion area. °· The catheter insertion area swells very fast. °· The catheter insertion area is bleeding, and the bleeding does not stop when you hold steady pressure on the area. °· The area near or just beyond the catheter insertion site becomes pale, cool, tingly, or numb. °These symptoms may represent a serious problem that is an emergency. Do not wait to see if the symptoms will go away. Get medical help right away. Call your local emergency services (911 in the [place] Do not drive yourself to the hospital. °Summary °· After the  procedure, it is common to have bruising and tenderness at the catheter insertion area. °· After the procedure, it is important to rest and drink plenty of fluids. °· Do not take baths, swim, or use a hot tub until your health care provider says it is okay to do so. You may shower 24-48 hours after the procedure or as told by your health care provider. °· If the catheter site starts bleeding, lie flat and put pressure on the site. If the bleeding does not stop, get help right away. This is a medical emergency. °This information is not intended to replace advice given to you by your health care provider. Make sure you discuss any questions you have with your health care provider. °Document Released: 03/17/2005 Document Revised: 08/03/2016 Document Reviewed: 08/03/2016 °Elsevier Interactive Patient Education © 2018 Elsevier Inc. ° °

## 2017-09-28 ENCOUNTER — Encounter (HOSPITAL_COMMUNITY): Payer: Self-pay | Admitting: Cardiovascular Disease

## 2017-09-28 MED FILL — Verapamil HCl IV Soln 2.5 MG/ML: INTRAVENOUS | Qty: 2 | Status: AC

## 2017-10-04 ENCOUNTER — Encounter: Payer: Self-pay | Admitting: Cardiology

## 2017-10-04 NOTE — Progress Notes (Signed)
Alisha [name] E  Date of visit:  10/04/2017 DOB:  [birth year]    Age:  77 yrs. Medical record number:  [id number]     Account number:  [id number] Primary Care Provider: Neila Gear A ____________________________ CURRENT DIAGNOSES  1. Secondary pulmonary hypertension  2. Aortic valve stenosis  3. Atherosclerosis of aorta  4. Peripheral Vascular Disease, Unspecified  5. Hypertensive heart disease without heart failure  6. CAD Native without angina  7. Dyspnea  8. Type 2 diabetes mellitus without complications  9. Hypothyroidism  10. Chronic kidney disease, stage 3 (moderate)  11. Family history of ischemic heart disease and other diseases of the circulatory system ____________________________ ALLERGIES  No Known Drug Allergies ____________________________ MEDICATIONS  1. amlodipine 5 mg tablet, 1 p.o. daily  2. atorvastatin 20 mg tablet, 1 p.o. daily  3. carvedilol 12.5 mg tablet, BID  4. cetirizine 10 mg tablet, 1 p.o. daily  5. clopidogrel 75 mg tablet, 1 p.o. daily  6. fenofibrate 160 mg tablet, 1 p.o. daily  7. furosemide 40 mg tablet, BID  8. glimepiride 4 mg tablet, 1 p.o. daily  9. levothyroxine 125 mcg tablet, 1 p.o. daily  10. losartan 100 mg tablet, 1 p.o. daily  11. metformin ER 500 mg 24 hr tablet,extended release, 1 to 2 tablets QD  12. pantoprazole 40 mg tablet,delayed release, 1 p.o. daily  13. PreserVision AREDS 7,160 unit-113 mg-100 unit tablet, 1 p.o. daily ____________________________ HISTORY OF PRESENT ILLNESS Patient returns for cardiac followup. She underwent cardiac catheterization last week by Dr. Burt Knack. She did not have significant coronary artery disease and was found to have severe pulmonary hypertension which is likely primary. Her wedge was quite low and intermediate pressure was 51 with [place] units of 6.6. She also did not have significant aortic stenosis or mitral stenosis according to Dr. Burt Knack. Since the catheterization she is clinically worsened.  Her weight has not changed but she has been severely dyspneic and can barely move across a room without severe dyspnea. At the time of her catheterization her right heart pressure was around 15. She denies angina but does have pressure when she exerts her softness had some episodes of presyncope but none recently within the past week. She still has some peripheral edema. She was back with her son and her daughter-in-law today to further discuss her situation. ____________________________ PAST HISTORY  Past Medical Illnesses:  hypertension, DM-non-insulin dependent, hyperlipidemia, irritable bowel syndrome, peripheral vascular disease, anemia, chronic kidney disease Stage 3, diverticulitis;  Cardiovascular Illnesses:  no previous history of cardiac disease;  Infectious Diseases:  no previous history of significant infectious diseases;  Surgical Procedures:  appendectomy, D and C, tubal ligation, left ankle surgery;  Trauma History:  no previous history of significant trauma;  NYHA Classification:  I;  Canadian Angina Classification:  Class 0: Asymptomatic;  Cardiology Procedures-Invasive:  no previous interventional or invasive cardiology procedures;  Cardiology Procedures-Noninvasive:  echocardiogram 2018, [place] 2018;  Peripheral Vascular Procedures:  CT scan of aorta;  LVEF not documented,   ____________________________ CARDIO-PULMONARY TEST DATES EKG Date:  10/04/2017;   ____________________________ SOCIAL HISTORY Alcohol Use:  rare;  Smoking:  never smoked;  Diet:  regular diet;  Lifestyle:  divorced;  Exercise:  no regular exercise;  Occupation:  Cabin crew;  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 Weight:  161.00 lbs.  Height:  65.50"BMI: 26  Constitutional:  pleasant white female, in no acute distress Skin:  warm and dry to  touch, no apparent skin lesions, or masses noted. Head:  normocephalic, normal hair pattern, no masses or tenderness Neck:  supple, without massess.JVD elevated with v waves at upright position  Chest:  normal symmetry, clear to auscultation. Cardiac:  regular rhythm, normal S1 and S2, grade 2/6 systolic murmur Abdomen:  abdomen soft,non-tender, no masses, no hepatospenomegaly, or aneurysm noted Peripheral Pulses:  the femoral,dorsalis pedis, and posterior tibial pulses are full and equal bilaterally with no bruits auscultated. Extremities & Back:  no spinal abnormalities noted., normal muscle strength and tone., 1-2+ edema Neurological:  no gross motor or sensory deficits noted, affect appropriate, oriented x3. ____________________________ MOST RECENT LIPID PANEL 07/01/17  CHOL TOTL 111 mg/dl, LDL 47 NM, HDL 47 mg/dl, TRIGLYCER 83 mg/dl and VLDL 17 ____________________________ IMPRESSIONS/PLAN  1. Dyspnea is due to severe pulmonary hypertension this is likely primary 2. Hypertensive heart disease 3. Mild aortic stenosis 4. Severe mitral annular calcification without evidence of mitral stenosis 5. Diabetes mellitus with some neuropathy  Recommendations:  She has severe pulmonary hypertension likely primary although have not excluded thromboembolic disease although previous CT scan did not show evidence of this. I would like for her to see pulmonary hypertension specialists. I am asking her to see Dr. Algernon Huxley and he can see her on Friday. I last for him to [place] and care at this point. In the meantime I increased her furosemide 80 mg twice daily because of peripheral edema. Followup with Dr. Algernon Huxley and I will see him along with him if needed.  ____________________________ Cleda Clarks  1. 12 Lead EKG: Today                       ____________________________ Cardiology Physician:  Alisha Hough MD [facility]

## 2017-10-06 ENCOUNTER — Other Ambulatory Visit: Payer: Self-pay

## 2017-10-06 ENCOUNTER — Encounter (HOSPITAL_COMMUNITY): Payer: Self-pay | Admitting: Cardiology

## 2017-10-06 ENCOUNTER — Ambulatory Visit (HOSPITAL_COMMUNITY)
Admission: RE | Admit: 2017-10-06 | Discharge: 2017-10-06 | Disposition: A | Discharge status: Home / Self Care | Payer: Medicare Other | Source: Ambulatory Visit | Attending: Cardiology | Admitting: Cardiology

## 2017-10-06 VITALS — BP 100/58 | HR 76 | Wt 164.8 lb

## 2017-10-06 DIAGNOSIS — E1122 Type 2 diabetes mellitus with diabetic chronic kidney disease: Secondary | ICD-10-CM | POA: Insufficient documentation

## 2017-10-06 DIAGNOSIS — I130 Hypertensive heart and chronic kidney disease with heart failure and stage 1 through stage 4 chronic kidney disease, or unspecified chronic kidney disease: Secondary | ICD-10-CM | POA: Insufficient documentation

## 2017-10-06 DIAGNOSIS — R55 Syncope and collapse: Secondary | ICD-10-CM | POA: Insufficient documentation

## 2017-10-06 DIAGNOSIS — I5032 Chronic diastolic (congestive) heart failure: Secondary | ICD-10-CM | POA: Insufficient documentation

## 2017-10-06 DIAGNOSIS — R0789 Other chest pain: Secondary | ICD-10-CM | POA: Diagnosis not present

## 2017-10-06 DIAGNOSIS — E039 Hypothyroidism, unspecified: Secondary | ICD-10-CM | POA: Diagnosis not present

## 2017-10-06 DIAGNOSIS — L97329 Non-pressure chronic ulcer of left ankle with unspecified severity: Secondary | ICD-10-CM | POA: Diagnosis not present

## 2017-10-06 DIAGNOSIS — Z7984 Long term (current) use of oral hypoglycemic drugs: Secondary | ICD-10-CM | POA: Insufficient documentation

## 2017-10-06 DIAGNOSIS — N183 Chronic kidney disease, stage 3 (moderate): Secondary | ICD-10-CM | POA: Insufficient documentation

## 2017-10-06 DIAGNOSIS — G4733 Obstructive sleep apnea (adult) (pediatric): Secondary | ICD-10-CM | POA: Diagnosis not present

## 2017-10-06 DIAGNOSIS — I050 Rheumatic mitral stenosis: Secondary | ICD-10-CM | POA: Diagnosis not present

## 2017-10-06 DIAGNOSIS — Z79899 Other long term (current) drug therapy: Secondary | ICD-10-CM | POA: Diagnosis not present

## 2017-10-06 DIAGNOSIS — E785 Hyperlipidemia, unspecified: Secondary | ICD-10-CM | POA: Insufficient documentation

## 2017-10-06 DIAGNOSIS — Z86718 Personal history of other venous thrombosis and embolism: Secondary | ICD-10-CM | POA: Diagnosis not present

## 2017-10-06 DIAGNOSIS — R0609 Other forms of dyspnea: Secondary | ICD-10-CM | POA: Diagnosis not present

## 2017-10-06 DIAGNOSIS — I739 Peripheral vascular disease, unspecified: Secondary | ICD-10-CM | POA: Diagnosis not present

## 2017-10-06 DIAGNOSIS — K589 Irritable bowel syndrome without diarrhea: Secondary | ICD-10-CM | POA: Insufficient documentation

## 2017-10-06 DIAGNOSIS — I272 Pulmonary hypertension, unspecified: Secondary | ICD-10-CM

## 2017-10-06 LAB — BRAIN NATRIURETIC PEPTIDE: B NATRIURETIC PEPTIDE 5: 1129 pg/mL — AB (ref 0.0–100.0)

## 2017-10-06 LAB — SEDIMENTATION RATE: SED RATE: 21 mm/h (ref 0–22)

## 2017-10-06 NOTE — Progress Notes (Signed)
Pt attempted  6 minute walk test. Pt walked 300 ft (92 meters). O2 sats ranged from 97%-71%, and HR ranged from 79-81. Information given to MD to review.

## 2017-10-06 NOTE — Patient Instructions (Addendum)
Start Opsumit 10 mg daily  Start Adcirca 20 mg daily  Your physician has recommended that you have a pulmonary function test. Pulmonary Function Tests are a group of tests that measure how well air moves in and out of your lungs.  Your physician has requested that you have a lower or upper extremity venous duplex. This test is an ultrasound of the veins in the legs or arms. It looks at venous blood flow that carries blood from the heart to the legs or arms. Allow one hour for a Lower Venous exam. Allow thirty minutes for an Upper Venous exam. There are no restrictions or special instructions.  We did a 6 minute walk test  Labs drawn today (if we do not call you, then your lab work was stable)   [place] will contact you for oxygen   Your physician recommends that you schedule a follow-up appointment in: 3 weeks with Dr. Aundra Dubin

## 2017-10-06 NOTE — Progress Notes (Signed)
SATURATION QUALIFICATIONS: (This note is used to comply with regulatory documentation for home oxygen)  Patient Saturations on Room Air at Rest = 97%  Patient Saturations on Room Air while Ambulating = 70%  Patient Saturations on 2.5 Liters of oxygen while Ambulating = 90%  Please briefly explain why patient needs home oxygen:

## 2017-10-07 LAB — ANTI-SCLERODERMA ANTIBODY: Scleroderma (Scl-70) (ENA) Antibody, IgG: <0.2 AI (ref 0.0–0.9)

## 2017-10-07 LAB — ANTI-JO 1 ANTIBODY, IGG: Anti JO-1: <0.2 AI (ref 0.0–0.9)

## 2017-10-07 LAB — RHEUMATOID FACTOR: Rhuematoid fact SerPl-aCnc: 15.1 IU/mL — ABNORMAL HIGH (ref 0.0–13.9)

## 2017-10-07 LAB — HIV ANTIBODY (ROUTINE TESTING W REFLEX): HIV SCREEN 4TH GENERATION: NONREACTIVE

## 2017-10-07 NOTE — Progress Notes (Signed)
PCP: Dr. Dimas Aguas Cardiology: Dr. Wynonia Lawman HF Cardiology: Dr. Aundra Dubin  77 yo with history of diabetes, HTN, PAD, hyperlipidemia who was referred by Dr. Wynonia Lawman for evaluation of pulmonary hypertension.   For > 1 year, she has had significant exertional dyspnea. It has been steadily worsening, especially over the last couple of months.  She has been extensively worked up so far.  Echo in 4/18 showed preserved EF 65% with moderate pulmonary hypertension.  She had episodes of syncope in 5/18 and 10/18.  She wore an event monitor in 5/18 with no significant arrhythmia.  LHC/RHC in 1/19 showed normal PCWP and severely elevated PA pressure, no significant CAD.  She currently does not get lightheaded spells.   Currently short of breath just walking around her house.  Short of breath walking < 50 yards outside.  No orthopnea/PND. No dyspnea with dressing though she gets short of breath showering.  Dyspnea at times is associated with chest tightness.   Lasix has been steadily increased.  She saw Dr. Wynonia Lawman earlier this week and Lasix was increased to 80 mg bid.  She has lost weight over the last couple of weeks on increasing Lasix. She says that she cannot take the 80 mg bid Lasix much longer due to incontinence.   6 minute walk (1/19): 92 m, oxygen saturation dropped to 70%.   ECG (personally reviewed): NSR, right axis deviation.   PMH: 1. PAD: Non-healing left ankle ulcer.  - ABIs 9/12 were normal.  2. Type 2 diabetes 3. Hypothyroidism 4. CKD stage 3 5. Hyperlipidemia 6. HTN 7. IBS 8. OSA: Mild, on CPAP.  9. Event monitor 5/18: No significant abnormality.  10. DVT in 1995 11. Pulmonary hypertension: Echo (4/18) with EF 65%, mild MR, mild TR, mild mitral stenosis, moderate pulmonary hypertension. - CTA chest 10/17: No PE.  - RHC (1/19): mean RA 11, PA 87/31 mean 51, mean PCWP 6, CI 3.58, PVR 7 WU.  12. Coronary angiography 11/18 without significant disease.  13. Mitral stenosis: Mild by cath in 1/19,  mean gradient 2.6 mmHg.   SH: Lives in [place], 2 kids, nonsmoker, rare ETOH.   FH: No pulmonary HTN.  Father and brother with MIs  ROS: All systems reviewed and negative except as per HPI.   Current Outpatient Medications  Medication Sig Dispense Refill  . amLODipine (NORVASC) 5 MG tablet Take 5 mg by mouth daily.    Marland Kitchen atorvastatin (LIPITOR) 20 MG tablet Take 20 mg by mouth every morning.    . carvedilol (COREG) 12.5 MG tablet Take 12.5 mg by mouth 2 (two) times daily with a meal.    . cetirizine (ZYRTEC) 10 MG tablet Take 10 mg by mouth daily as needed for allergies.    Marland Kitchen clopidogrel (PLAVIX) 75 MG tablet Take 75 mg by mouth daily.    . fenofibrate 160 MG tablet Take 160 mg by mouth every morning.    . furosemide (LASIX) 40 MG tablet Take 40 mg by mouth 2 (two) times daily.    Marland Kitchen glimepiride (AMARYL) 4 MG tablet Take 4 mg by mouth daily with breakfast.    . levothyroxine (SYNTHROID, LEVOTHROID) 125 MCG tablet Take 125 mcg by mouth daily before breakfast.    . losartan (COZAAR) 100 MG tablet Take 100 mg by mouth daily.    . metFORMIN (GLUCOPHAGE-XR) 500 MG 24 hr tablet Take 500 mg by mouth daily with breakfast. Pt may take additional dose if needed    . Multiple Vitamins-Minerals (PRESERVISION AREDS 2  PO) Take 1 capsule by mouth 2 (two) times daily.    . pantoprazole (PROTONIX) 40 MG tablet Take 40 mg by mouth every morning.     No current facility-administered medications for this encounter.    BP (!) 100/58   Pulse 76   Wt 164 lb 12.8 oz (74.8 kg)   SpO2 97%   BMI 27.01 kg/m  General: NAD Neck: No JVD, no thyromegaly or thyroid nodule.  Lungs: Slight crackles left base.  CV: Nondisplaced PMI.  Heart regular S1/S2, no S3/S4, 2/6 early SEM RUSB with clear S2.  1+ right ankle edema.  No carotid bruit.  Normal pedal pulses.  Abdomen: Soft, nontender, no hepatosplenomegaly, no distention.  Skin: Intact without lesions or rashes.  Neurologic: Alert and oriented x 3.  Psych:  Normal affect. Extremities: No clubbing or cyanosis.  HEENT: Normal.   Assessment/Plan: 1. Pulmonary hypertension.  Severe PAH with PVR 7 WU on 1/19 RHC.  Etiology uncertain.  She has a history of DVT remotely.  She did have a CTA chest in 10/17 that did not show evidence for PE.  She has OSA and uses CPAP.  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  Poor 6 minute walk, qualifies for oxygen with exertion.  Rheumatological disease has not been rule out.  - V/Q scan to rule out chronic PE. - PFTs: Assess for parenchymal lung disease.  - I will start working on selective pulmonary vasodilators.  Will start Opsumit 10 mg daily followed closely by tadalafil (combination therapy).   - Needs serologic workup: ANA, anti-Jo, anti-SCL70, RF, anti-dsDNA, HIV, BNP, ESR.  - Close followup, see me in 3 wks.  2. Syncope: 2 episodes in the last year.  Event monitor after first episode reportedly unremarkable.  - Could be related to severe pulmonary hypertension.  3. Chest tightness: Noted with exertional dyspnea.  No significant CAD on cath.  Tightness may be due to severe pulmonary hypertension.  4. Mitral stenosis: Noted on echo and cath, mild.   5. H/o DVT: As above, getting V/Q scan.  6. Chronic diastolic CHF/RV failure: In setting of PAH and some degree of cor pulmonale.  On exam, she does not look volume overloaded today and weight is down.  She does not think she can continue to take Lasix 80 mg po bid.  - Decrease Lasix back to 40 mg bid.  Check BMET and BNP today.  7. HTN: BP soft today and has been on the lower side at home.  Decrease amlodipine to 2.5 mg daily 8. OSA: Continue CPAP.   Loralie Champagne 10/07/2017

## 2017-10-10 ENCOUNTER — Encounter (HOSPITAL_COMMUNITY): Payer: Self-pay

## 2017-10-10 ENCOUNTER — Ambulatory Visit (HOSPITAL_COMMUNITY): Payer: Medicare Other

## 2017-10-10 ENCOUNTER — Ambulatory Visit (HOSPITAL_COMMUNITY)
Admission: RE | Admit: 2017-10-10 | Discharge: 2017-10-10 | Disposition: A | Discharge status: Home / Self Care | Payer: Medicare Other | Source: Ambulatory Visit | Attending: Cardiology | Admitting: Cardiology

## 2017-10-10 DIAGNOSIS — J449 Chronic obstructive pulmonary disease, unspecified: Secondary | ICD-10-CM | POA: Insufficient documentation

## 2017-10-10 DIAGNOSIS — I272 Pulmonary hypertension, unspecified: Secondary | ICD-10-CM | POA: Diagnosis not present

## 2017-10-10 LAB — PULMONARY FUNCTION TEST
DL/VA % PRED: 43 %
DL/VA: 2.15 ml/min/mmHg/L
DLCO UNC: 7.4 ml/min/mmHg
DLCO unc % pred: 28 %
FEF 25-75 POST: 1.08 L/s
FEF 25-75 Pre: 1.25 L/sec
FEF2575-%Change-Post: -13 %
FEF2575-%Pred-Post: 62 %
FEF2575-%Pred-Pre: 73 %
FEV1-%Change-Post: -4 %
FEV1-%Pred-Post: 73 %
FEV1-%Pred-Pre: 77 %
FEV1-Post: 1.62 L
FEV1-Pre: 1.7 L
FEV1FVC-%Change-Post: -3 %
FEV1FVC-%PRED-PRE: 97 %
FEV6-%Change-Post: -4 %
FEV6-%PRED-POST: 78 %
FEV6-%Pred-Pre: 82 %
FEV6-POST: 2.2 L
FEV6-Pre: 2.31 L
FEV6FVC-%CHANGE-POST: 0 %
FEV6FVC-%PRED-POST: 105 %
FEV6FVC-%Pred-Pre: 105 %
FVC-%Change-Post: -1 %
FVC-%PRED-PRE: 78 %
FVC-%Pred-Post: 78 %
FVC-PRE: 2.31 L
FVC-Post: 2.29 L
PRE FEV1/FVC RATIO: 73 %
Post FEV1/FVC ratio: 71 %
Post FEV6/FVC ratio: 100 %
Pre FEV6/FVC Ratio: 100 %
RV % pred: 90 %
RV: 2.12 L
TLC % PRED: 82 %
TLC: 4.28 L

## 2017-10-10 MED ORDER — ALBUTEROL SULFATE (2.5 MG/3ML) 0.083% IN NEBU
2.5000 mg | INHALATION_SOLUTION | Freq: Once | RESPIRATORY_TRACT | Status: AC
  Administered 2017-10-10: 2.5 mg via RESPIRATORY_TRACT

## 2017-10-11 ENCOUNTER — Encounter (HOSPITAL_COMMUNITY): Payer: Self-pay | Admitting: Cardiology

## 2017-10-12 ENCOUNTER — Ambulatory Visit (HOSPITAL_COMMUNITY)
Admission: RE | Admit: 2017-10-12 | Discharge: 2017-10-12 | Disposition: A | Discharge status: Home / Self Care | Payer: Medicare Other | Source: Ambulatory Visit | Attending: Cardiology | Admitting: Cardiology

## 2017-10-12 ENCOUNTER — Telehealth (HOSPITAL_COMMUNITY): Payer: Self-pay

## 2017-10-12 DIAGNOSIS — I272 Pulmonary hypertension, unspecified: Secondary | ICD-10-CM | POA: Insufficient documentation

## 2017-10-12 DIAGNOSIS — J849 Interstitial pulmonary disease, unspecified: Secondary | ICD-10-CM

## 2017-10-12 NOTE — Telephone Encounter (Signed)
Notes recorded by Shirley Muscat, RN on 10/12/2017 at 9:45 AM EST Pt aware of results and orders placed ------  Notes recorded by Larey Dresser, MD on 10/10/2017 at 4:36 PM EST Mild obstructive airways disease, some restriction, severely decreased DLCO. Fits picture of primary pulmonary hypertension, but need to rule out interstitial lung disease with restriction. Need to set up high resolution CT chest to assess for interstitial lung disease.   Notes recorded by Shirley Muscat, RN on 10/12/2017 at 9:45 AM EST Pt aware of results and Lab appointment made (orders placed) for 10/12/17 at 2:15  ------  Notes recorded by Larey Dresser, MD on 10/07/2017 at 2:49 PM EST Slightly elevated RF, will need to send CCP antibody

## 2017-10-13 ENCOUNTER — Other Ambulatory Visit (HOSPITAL_COMMUNITY): Payer: Self-pay

## 2017-10-13 ENCOUNTER — Telehealth (HOSPITAL_COMMUNITY): Payer: Self-pay

## 2017-10-13 ENCOUNTER — Telehealth (HOSPITAL_COMMUNITY): Payer: Self-pay | Admitting: Pharmacist

## 2017-10-13 DIAGNOSIS — I2609 Other pulmonary embolism with acute cor pulmonale: Secondary | ICD-10-CM

## 2017-10-13 DIAGNOSIS — I270 Primary pulmonary hypertension: Secondary | ICD-10-CM

## 2017-10-13 MED ORDER — MACITENTAN 10 MG PO TABS: 10.0000 mg | ORAL_TABLET | Freq: Every day | ORAL | 11 refills | Status: DC

## 2017-10-13 NOTE — Telephone Encounter (Signed)
Opsumit PA approved by [employer] through 09/11/18.   Ruta Hinds. Velva Harman, PharmD, BCPS, CPP Clinical Pharmacist Phone: [phone] 10/13/2017 8:56 AM

## 2017-10-13 NOTE — Telephone Encounter (Signed)
Pt insurance no longer covers PFT/VQ scan in hospital...had to be rescheduled 2/6 at [facility]. Pt aware and agreeable

## 2017-10-16 NOTE — Telephone Encounter (Signed)
[facility] does not do PFT's or VQ scans, orders placed for both to be done at [facility], pt's High Res chest CT must be done at [facility] due to insurance and new order placed w/GI as location

## 2017-10-16 NOTE — Addendum Note (Signed)
Addended by: Scarlette Calico on: 10/16/2017 09:30 AM   Modules accepted: Orders

## 2017-10-16 NOTE — Addendum Note (Signed)
Addended by: Scarlette Calico on: 10/16/2017 09:40 AM   Modules accepted: Orders

## 2017-10-18 ENCOUNTER — Encounter (HOSPITAL_COMMUNITY): Payer: Self-pay | Admitting: *Deleted

## 2017-10-18 ENCOUNTER — Telehealth (HOSPITAL_COMMUNITY): Payer: Self-pay | Admitting: Pharmacist

## 2017-10-18 ENCOUNTER — Encounter (HOSPITAL_COMMUNITY): Payer: Self-pay

## 2017-10-18 ENCOUNTER — Ambulatory Visit (HOSPITAL_COMMUNITY)
Admission: RE | Admit: 2017-10-18 | Discharge: 2017-10-18 | Disposition: A | Discharge status: Home / Self Care | Payer: Medicare Other | Source: Ambulatory Visit | Attending: Cardiology | Admitting: Cardiology

## 2017-10-18 ENCOUNTER — Encounter (HOSPITAL_COMMUNITY)
Admission: RE | Admit: 2017-10-18 | Discharge: 2017-10-18 | Disposition: A | Discharge status: Home / Self Care | Payer: Medicare Other | Source: Ambulatory Visit | Attending: Cardiology | Admitting: Cardiology

## 2017-10-18 DIAGNOSIS — I517 Cardiomegaly: Secondary | ICD-10-CM | POA: Diagnosis not present

## 2017-10-18 DIAGNOSIS — I700 Atherosclerosis of aorta: Secondary | ICD-10-CM | POA: Diagnosis not present

## 2017-10-18 DIAGNOSIS — I272 Pulmonary hypertension, unspecified: Secondary | ICD-10-CM

## 2017-10-18 DIAGNOSIS — J90 Pleural effusion, not elsewhere classified: Secondary | ICD-10-CM | POA: Insufficient documentation

## 2017-10-18 DIAGNOSIS — R918 Other nonspecific abnormal finding of lung field: Secondary | ICD-10-CM | POA: Diagnosis not present

## 2017-10-18 MED ORDER — TECHNETIUM TO 99M ALBUMIN AGGREGATED
4.0000 | Freq: Once | INTRAVENOUS | Status: AC | PRN
  Administered 2017-10-18: 6.3 via INTRAVENOUS

## 2017-10-18 MED ORDER — TECHNETIUM TC 99M DIETHYLENETRIAME-PENTAACETIC ACID
30.0000 | Freq: Once | INTRAVENOUS | Status: AC | PRN
  Administered 2017-10-18: 31.7 via RESPIRATORY_TRACT

## 2017-10-18 NOTE — Telephone Encounter (Signed)
Tadalafil PA approved by [employer] Part D through 09/11/18.    Ruta Hinds. Velva Harman, PharmD, BCPS, CPP Clinical Pharmacist Phone: [phone] 10/18/2017 10:42 AM

## 2017-10-19 ENCOUNTER — Encounter (HOSPITAL_COMMUNITY): Payer: Self-pay | Admitting: Cardiology

## 2017-10-20 ENCOUNTER — Encounter (HOSPITAL_COMMUNITY): Payer: Self-pay

## 2017-10-20 ENCOUNTER — Telehealth (HOSPITAL_COMMUNITY): Payer: Self-pay | Admitting: *Deleted

## 2017-10-20 MED ORDER — SILDENAFIL CITRATE 20 MG PO TABS: 20.0000 mg | ORAL_TABLET | Freq: Three times a day (TID) | ORAL | 3 refills | Status: DC

## 2017-10-20 NOTE — Telephone Encounter (Signed)
Received message below from patient.  Spoke with Dr. Aundra Dubin and he advises patient to stop taking Opsumit and don't start Adcirca.  He wants patient to take lasix 60 mg BID for 3 days then resume 40 mg BID.  He wants patient to start taking sildenafil 20 mg TID.   Prescription sent to pharmacy and patient is agreeable with plan.   Message   ----- Message from [place], Generic sent at 10/19/2017 5:27 PM EST -----    I am sorry to bother you again. I got a call from the special pharmacy regarding prescription of new med. The cost is $1358.71/66mos. I told them no way can I afford that. They said they could contact insurance. My question is that since I have been taking it the past 7 days, I am having lots of fluid retention. I am taking increased 40mg  Furosemide up to 3 times per day. Will I be staying on Opsumt even with the fluid retention. If not, there is not need to have pharmacy contact ins. I'm wondering if I can afford it anyway with ins? Thank you.

## 2017-10-23 ENCOUNTER — Ambulatory Visit
Admission: RE | Admit: 2017-10-23 | Discharge: 2017-10-23 | Disposition: A | Discharge status: Home / Self Care | Payer: Medicare Other | Source: Ambulatory Visit | Attending: Cardiology | Admitting: Cardiology

## 2017-10-23 DIAGNOSIS — I270 Primary pulmonary hypertension: Secondary | ICD-10-CM

## 2017-11-06 ENCOUNTER — Ambulatory Visit (HOSPITAL_COMMUNITY)
Admission: RE | Admit: 2017-11-06 | Discharge: 2017-11-06 | Disposition: A | Discharge status: Home / Self Care | Payer: Medicare Other | Source: Ambulatory Visit | Attending: Cardiology | Admitting: Cardiology

## 2017-11-06 ENCOUNTER — Encounter (HOSPITAL_COMMUNITY): Payer: Self-pay

## 2017-11-06 VITALS — BP 100/52 | HR 64 | Wt 164.0 lb

## 2017-11-06 DIAGNOSIS — Z79899 Other long term (current) drug therapy: Secondary | ICD-10-CM | POA: Diagnosis not present

## 2017-11-06 DIAGNOSIS — G4733 Obstructive sleep apnea (adult) (pediatric): Secondary | ICD-10-CM | POA: Diagnosis not present

## 2017-11-06 DIAGNOSIS — E039 Hypothyroidism, unspecified: Secondary | ICD-10-CM | POA: Insufficient documentation

## 2017-11-06 DIAGNOSIS — I130 Hypertensive heart and chronic kidney disease with heart failure and stage 1 through stage 4 chronic kidney disease, or unspecified chronic kidney disease: Secondary | ICD-10-CM | POA: Insufficient documentation

## 2017-11-06 DIAGNOSIS — I050 Rheumatic mitral stenosis: Secondary | ICD-10-CM | POA: Diagnosis not present

## 2017-11-06 DIAGNOSIS — Z7984 Long term (current) use of oral hypoglycemic drugs: Secondary | ICD-10-CM | POA: Insufficient documentation

## 2017-11-06 DIAGNOSIS — R911 Solitary pulmonary nodule: Secondary | ICD-10-CM | POA: Insufficient documentation

## 2017-11-06 DIAGNOSIS — N183 Chronic kidney disease, stage 3 (moderate): Secondary | ICD-10-CM | POA: Diagnosis not present

## 2017-11-06 DIAGNOSIS — I5032 Chronic diastolic (congestive) heart failure: Secondary | ICD-10-CM

## 2017-11-06 DIAGNOSIS — E785 Hyperlipidemia, unspecified: Secondary | ICD-10-CM | POA: Diagnosis not present

## 2017-11-06 DIAGNOSIS — R55 Syncope and collapse: Secondary | ICD-10-CM | POA: Insufficient documentation

## 2017-11-06 DIAGNOSIS — Z7902 Long term (current) use of antithrombotics/antiplatelets: Secondary | ICD-10-CM | POA: Insufficient documentation

## 2017-11-06 DIAGNOSIS — K589 Irritable bowel syndrome without diarrhea: Secondary | ICD-10-CM | POA: Diagnosis not present

## 2017-11-06 DIAGNOSIS — I272 Pulmonary hypertension, unspecified: Secondary | ICD-10-CM

## 2017-11-06 DIAGNOSIS — Z86718 Personal history of other venous thrombosis and embolism: Secondary | ICD-10-CM | POA: Diagnosis not present

## 2017-11-06 DIAGNOSIS — E1122 Type 2 diabetes mellitus with diabetic chronic kidney disease: Secondary | ICD-10-CM | POA: Insufficient documentation

## 2017-11-06 DIAGNOSIS — Z79890 Hormone replacement therapy (postmenopausal): Secondary | ICD-10-CM | POA: Diagnosis not present

## 2017-11-06 DIAGNOSIS — Z8249 Family history of ischemic heart disease and other diseases of the circulatory system: Secondary | ICD-10-CM | POA: Insufficient documentation

## 2017-11-06 LAB — BASIC METABOLIC PANEL
Anion gap: 11 (ref 5–15)
BUN: 26 mg/dL — ABNORMAL HIGH (ref 6–20)
CHLORIDE: 107 mmol/L (ref 101–111)
CO2: 20 mmol/L — ABNORMAL LOW (ref 22–32)
CREATININE: 1.31 mg/dL — AB (ref 0.44–1.00)
Calcium: 9.2 mg/dL (ref 8.9–10.3)
GFR calc non Af Amer: 39 mL/min — ABNORMAL LOW (ref >60)
GFR, EST AFRICAN AMERICAN: 45 mL/min — AB (ref >60)
Glucose, Bld: 166 mg/dL — ABNORMAL HIGH (ref 65–99)
POTASSIUM: 4 mmol/L (ref 3.5–5.1)
Sodium: 138 mmol/L (ref 135–145)

## 2017-11-06 MED ORDER — AMLODIPINE BESYLATE 5 MG PO TABS: 2.5000 mg | ORAL_TABLET | Freq: Every day | ORAL | 3 refills | Status: DC

## 2017-11-06 MED ORDER — POTASSIUM CHLORIDE CRYS ER 20 MEQ PO TBCR: 40.0000 meq | EXTENDED_RELEASE_TABLET | Freq: Every day | ORAL | 3 refills | Status: DC

## 2017-11-06 MED ORDER — FUROSEMIDE 80 MG PO TABS: ORAL_TABLET | ORAL | 3 refills | Status: DC

## 2017-11-06 NOTE — Patient Instructions (Addendum)
Decrease Amlodipine to 2.5 mg daily  Increase Furosemide to 80 mg in AM and 40 mg in PM  Start Potassium 40 meq daily  Labs today  Labs in 10 days  Start [name], this is a specialty medication that will come from the specialty pharmacy.  You will have home nurse from the pharmacy assigned to you, she will help you with the titration of this medication.  Your physician recommends that you schedule a follow-up appointment in: 3 weeks

## 2017-11-06 NOTE — Progress Notes (Signed)
PCP: Dr. Dimas Aguas Cardiology: Dr. Wynonia Lawman HF Cardiology: Dr. Aundra Dubin  77 yo with history of diabetes, HTN, PAD, hyperlipidemia who was referred by Dr. Wynonia Lawman for evaluation of pulmonary hypertension.   For > 1 year, she has had significant exertional dyspnea. It has been steadily worsening, especially over the last few months.  She has been extensively worked up so far.  Echo in 4/18 showed preserved EF 65% with moderate pulmonary hypertension.  She had episodes of syncope in 5/18 and 10/18.  She wore an event monitor in 5/18 with no significant arrhythmia.  LHC/RHC in 1/19 showed normal PCWP and severely elevated PA pressure, no significant CAD.    At initial appointment in 1/19, she was noted to be hypoxemic with exertion and home oxygen was started for use with exertion.  I also started her on Opsumit. She had to stop this after about a week due to significantly increased exertional peripheral edema.  This resolved after Opsumit was stopped.   She continues to have significant exertional dyspnea. It is improved when she uses her oxygen.  She is short of breath with moderate exertion (hard to change bed linen).  Generally ok walking on flat ground with her oxygen.  No joint pain, no rash.  No chest pain.  No lightheadedness/syncope. No palpitations. Weight is stable. BP is soft today, she is occasionally short of breath.   6 minute walk (1/19): 92 m, oxygen saturation dropped to 70%.    Labs (1/19): anti-Jo1 negative, RF borderline positive, SCL-70 negative, HIV negative. ESR 21. BNP 1129.   PMH: 1. PAD: Non-healing left ankle ulcer.  - ABIs 9/12 were normal.  2. Type 2 diabetes 3. Hypothyroidism 4. CKD stage 3 5. Hyperlipidemia 6. HTN 7. IBS 8. OSA: Mild, on CPAP.  9. Event monitor 5/18: No significant abnormality.  10. DVT in 1995 11. Pulmonary hypertension: Echo (4/18) with EF 65%, mild MR, mild TR, mild mitral stenosis, moderate pulmonary hypertension. - CTA chest 10/17: No PE.  - RHC  (1/19): mean RA 11, PA 87/31 mean 51, mean PCWP 6, CI 3.58, PVR 7 WU.  - PFTs (1/19): mild obstruction, some restriction => severely decreased DLCO. - High resolution CT chest (1/19): no interstitial lung disease or emphysema. 7 mm nodule RUL.  - anti-Jo1 negative, RF borderline positive, SCL-70 negative, HIV negative.  - V/Q scan 2/19: No e/o chronic PE.  12. Coronary angiography 11/18 without significant disease.  13. Mitral stenosis: Mild by cath in 1/19, mean gradient 2.6 mmHg.   SH: Lives in [place], 2 kids, nonsmoker, rare ETOH.   FH: No pulmonary HTN.  Father and brother with MIs  ROS: All systems reviewed and negative except as per HPI.   Current Outpatient Medications  Medication Sig Dispense Refill  . amLODipine (NORVASC) 5 MG tablet Take 0.5 tablets (2.5 mg total) by mouth daily. 45 tablet 3  . atorvastatin (LIPITOR) 20 MG tablet Take 20 mg by mouth every morning.    . carvedilol (COREG) 12.5 MG tablet Take 12.5 mg by mouth 2 (two) times daily with a meal.    . cetirizine (ZYRTEC) 10 MG tablet Take 10 mg by mouth daily as needed for allergies.    Marland Kitchen clopidogrel (PLAVIX) 75 MG tablet Take 75 mg by mouth daily.    . fenofibrate 160 MG tablet Take 160 mg by mouth every morning.    . furosemide (LASIX) 80 MG tablet Take 1 tablet (80 mg total) by mouth every morning AND 0.5 tablets (40  mg total) every evening. 45 tablet 3  . glimepiride (AMARYL) 4 MG tablet Take 4 mg by mouth daily with breakfast.    . levothyroxine (SYNTHROID, LEVOTHROID) 125 MCG tablet Take 125 mcg by mouth daily before breakfast.    . losartan (COZAAR) 100 MG tablet Take 100 mg by mouth daily.    . metFORMIN (GLUCOPHAGE-XR) 500 MG 24 hr tablet Take 500 mg by mouth daily with breakfast. Pt may take additional dose if needed    . Multiple Vitamins-Minerals (PRESERVISION AREDS 2 PO) Take 1 capsule by mouth 2 (two) times daily.    . pantoprazole (PROTONIX) 40 MG tablet Take 40 mg by mouth every morning.    .  potassium chloride SA (K-DUR,KLOR-CON) 20 MEQ tablet Take 2 tablets (40 mEq total) by mouth daily. 180 tablet 3   No current facility-administered medications for this encounter.    BP (!) 100/52   Pulse 64   Wt 164 lb (74.4 kg)   SpO2 92%   BMI 26.88 kg/m  General: NAD Neck: JVP 10 cm, no thyromegaly or thyroid nodule.  Lungs: Clear to auscultation bilaterally with normal respiratory effort. CV: Nondisplaced PMI.  Heart regular S1/S2, no S3/S4,2/6 SEM RUSB.  1+ ankle edema.  No carotid bruit.  Normal pedal pulses.  Abdomen: Soft, nontender, no hepatosplenomegaly, no distention.  Skin: Intact without lesions or rashes.  Neurologic: Alert and oriented x 3.  Psych: Normal affect. Extremities: No clubbing or cyanosis.  HEENT: Normal.   Assessment/Plan: 1. Pulmonary hypertension.  Severe PAH with PVR 7 WU on 1/19 RHC.  Etiology uncertain.  She has a history of DVT remotely but V/Q scan in 2/19 did not show evidence for chronic PE. She has OSA and uses CPAP.  Serologic workup so far negative.  CT chest with no ILD or emphysema.  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  She did not tolerate Opsumit due to significant lower extremity edema that resolved off Opsumit.  - She will be starting tadalafil this week.   - I will work on getting her started on selexipag to use on combination with tadalafil.  - Still needs ANA drawn, and with borderline positive RF, I will get anti-CCP antibody.   - 6 minute walk at next appointment after she has started tadalafil.   2. Syncope: 2 episodes in the last year.  Event monitor after first episode reportedly unremarkable.  - Could be related to severe pulmonary hypertension.  3. Mitral stenosis: Noted on echo and cath, mild.   4. H/o DVT: No chronic PE on V/Q scan.   5. Chronic diastolic CHF/RV failure: In setting of PAH and some degree of cor pulmonale.  She looks volume overloaded on exam.  NYHA class III symptoms.  - Increase Lasix to 80 qam/40 qpm.   Add KCl 40 daily. She  will need BMET today and in 10 days.  6. HTN: BP soft today, decrease amlodipine to 2.5 mg daily.  7. OSA: Continue CPAP.  8. Lung nodule: Non-contrast CT chest in 7/19.    Followup in 3 wks.   Alisha [name] 11/06/2017

## 2017-11-07 LAB — ENA+DNA/DS+SJORGEN'S
DS DNA AB: 5 [IU]/mL (ref 0–9)
ENA SM Ab Ser-aCnc: <0.2 AI (ref 0.0–0.9)
SSA (Ro) (ENA) Antibody, IgG: <0.2 AI (ref 0.0–0.9)
SSB (La) (ENA) Antibody, IgG: <0.2 AI (ref 0.0–0.9)

## 2017-11-07 LAB — ANA W/REFLEX: Anti Nuclear Antibody(ANA): POSITIVE — AB

## 2017-11-08 ENCOUNTER — Telehealth (HOSPITAL_COMMUNITY): Payer: Self-pay | Admitting: Pharmacist

## 2017-11-08 MED ORDER — SELEXIPAG 200 MCG PO TABS: 200.0000 ug | ORAL_TABLET | Freq: Two times a day (BID) | ORAL | 11 refills | Status: DC

## 2017-11-08 NOTE — Telephone Encounter (Signed)
Uptravi 200/800 mcg PA approved by [employer] through 09/11/18.   Ruta Hinds. Velva Harman, PharmD, BCPS, CPP Clinical Pharmacist Phone: [phone] 11/08/2017 12:31 PM

## 2017-11-10 ENCOUNTER — Telehealth (HOSPITAL_COMMUNITY): Payer: Self-pay

## 2017-11-10 NOTE — Telephone Encounter (Signed)
Patient calling to f/u on portable O2 tank order, states she was in clinic with Dr. Aundra Dubin on Monday and his nurse was working on this. Will forward to Dr. Claris Gladden Nurse to f/u.  Renee Pain, RN

## 2017-11-10 NOTE — Telephone Encounter (Signed)
Completed a PA for all does of Uptravi.  PA approved for 200/800, 400 mcg, 600 mcg, 800 mcg, 1000 mcg, 1200 mcg, 1400 mcg and 1600 mcg tabs.  Approval # [id number] from 11/08/17 through 09/11/18.

## 2017-11-16 ENCOUNTER — Ambulatory Visit (HOSPITAL_COMMUNITY)
Admission: RE | Admit: 2017-11-16 | Discharge: 2017-11-16 | Disposition: A | Discharge status: Home / Self Care | Payer: Medicare Other | Source: Ambulatory Visit | Attending: Internal Medicine | Admitting: Internal Medicine

## 2017-11-16 DIAGNOSIS — I272 Pulmonary hypertension, unspecified: Secondary | ICD-10-CM

## 2017-11-16 LAB — BASIC METABOLIC PANEL
ANION GAP: 11 (ref 5–15)
BUN: 28 mg/dL — AB (ref 6–20)
CHLORIDE: 107 mmol/L (ref 101–111)
CO2: 20 mmol/L — ABNORMAL LOW (ref 22–32)
Calcium: 9.1 mg/dL (ref 8.9–10.3)
Creatinine, Ser: 1.4 mg/dL — ABNORMAL HIGH (ref 0.44–1.00)
GFR calc Af Amer: 41 mL/min — ABNORMAL LOW (ref >60)
GFR calc non Af Amer: 36 mL/min — ABNORMAL LOW (ref >60)
GLUCOSE: 145 mg/dL — AB (ref 65–99)
POTASSIUM: 3.8 mmol/L (ref 3.5–5.1)
Sodium: 138 mmol/L (ref 135–145)

## 2017-11-17 NOTE — Telephone Encounter (Signed)
Patient came to clinic stating still has not heard from Dr. Aundra Dubin or nurse regarding oxygen portable tank. Eval order sent to [facility] for portable O2 tank. Patient aware and appreciative.  Renee Pain, RN

## 2017-11-27 ENCOUNTER — Ambulatory Visit (HOSPITAL_COMMUNITY)
Admission: RE | Admit: 2017-11-27 | Discharge: 2017-11-27 | Disposition: A | Discharge status: Home / Self Care | Payer: Medicare Other | Source: Ambulatory Visit | Attending: Cardiology | Admitting: Cardiology

## 2017-11-27 ENCOUNTER — Encounter (HOSPITAL_COMMUNITY): Payer: Self-pay | Admitting: Cardiology

## 2017-11-27 VITALS — BP 96/56 | HR 71 | Wt 171.0 lb

## 2017-11-27 DIAGNOSIS — E785 Hyperlipidemia, unspecified: Secondary | ICD-10-CM | POA: Diagnosis not present

## 2017-11-27 DIAGNOSIS — Z7984 Long term (current) use of oral hypoglycemic drugs: Secondary | ICD-10-CM | POA: Insufficient documentation

## 2017-11-27 DIAGNOSIS — I5032 Chronic diastolic (congestive) heart failure: Secondary | ICD-10-CM | POA: Insufficient documentation

## 2017-11-27 DIAGNOSIS — E1122 Type 2 diabetes mellitus with diabetic chronic kidney disease: Secondary | ICD-10-CM | POA: Diagnosis not present

## 2017-11-27 DIAGNOSIS — R55 Syncope and collapse: Secondary | ICD-10-CM | POA: Insufficient documentation

## 2017-11-27 DIAGNOSIS — Z86718 Personal history of other venous thrombosis and embolism: Secondary | ICD-10-CM | POA: Diagnosis not present

## 2017-11-27 DIAGNOSIS — G4733 Obstructive sleep apnea (adult) (pediatric): Secondary | ICD-10-CM | POA: Insufficient documentation

## 2017-11-27 DIAGNOSIS — I272 Pulmonary hypertension, unspecified: Secondary | ICD-10-CM | POA: Insufficient documentation

## 2017-11-27 DIAGNOSIS — Z79899 Other long term (current) drug therapy: Secondary | ICD-10-CM | POA: Diagnosis not present

## 2017-11-27 DIAGNOSIS — K589 Irritable bowel syndrome without diarrhea: Secondary | ICD-10-CM | POA: Insufficient documentation

## 2017-11-27 DIAGNOSIS — N183 Chronic kidney disease, stage 3 (moderate): Secondary | ICD-10-CM | POA: Insufficient documentation

## 2017-11-27 DIAGNOSIS — I050 Rheumatic mitral stenosis: Secondary | ICD-10-CM | POA: Diagnosis not present

## 2017-11-27 DIAGNOSIS — I130 Hypertensive heart and chronic kidney disease with heart failure and stage 1 through stage 4 chronic kidney disease, or unspecified chronic kidney disease: Secondary | ICD-10-CM | POA: Diagnosis present

## 2017-11-27 DIAGNOSIS — R911 Solitary pulmonary nodule: Secondary | ICD-10-CM | POA: Insufficient documentation

## 2017-11-27 DIAGNOSIS — E039 Hypothyroidism, unspecified: Secondary | ICD-10-CM | POA: Insufficient documentation

## 2017-11-27 DIAGNOSIS — I739 Peripheral vascular disease, unspecified: Secondary | ICD-10-CM | POA: Diagnosis not present

## 2017-11-27 LAB — BASIC METABOLIC PANEL
Anion gap: 11 (ref 5–15)
BUN: 30 mg/dL — AB (ref 6–20)
CO2: 21 mmol/L — ABNORMAL LOW (ref 22–32)
Calcium: 8.7 mg/dL — ABNORMAL LOW (ref 8.9–10.3)
Chloride: 109 mmol/L (ref 101–111)
Creatinine, Ser: 1.55 mg/dL — ABNORMAL HIGH (ref 0.44–1.00)
GFR, EST AFRICAN AMERICAN: 37 mL/min — AB (ref >60)
GFR, EST NON AFRICAN AMERICAN: 32 mL/min — AB (ref >60)
Glucose, Bld: 197 mg/dL — ABNORMAL HIGH (ref 65–99)
Potassium: 3.7 mmol/L (ref 3.5–5.1)
SODIUM: 141 mmol/L (ref 135–145)

## 2017-11-27 MED ORDER — TORSEMIDE 20 MG PO TABS: 80.0000 mg | ORAL_TABLET | Freq: Every day | ORAL | 2 refills | Status: DC

## 2017-11-27 MED ORDER — CARVEDILOL 6.25 MG PO TABS: 6.2500 mg | ORAL_TABLET | Freq: Two times a day (BID) | ORAL | 3 refills | Status: DC

## 2017-11-27 NOTE — Progress Notes (Signed)
PCP: Dr. Dimas Aguas Cardiology: Dr. Wynonia Lawman HF Cardiology: Dr. Aundra Dubin  77 yo with history of diabetes, HTN, PAD, hyperlipidemia who was referred by Dr. Wynonia Lawman for evaluation of pulmonary hypertension.   For > 1 year, she has had significant exertional dyspnea. It has been steadily worsening, especially over the last few months.  She has been extensively worked up so far.  Echo in 4/18 showed preserved EF 65% with moderate pulmonary hypertension.  She had episodes of syncope in 5/18 and 10/18.  She wore an event monitor in 5/18 with no significant arrhythmia.  LHC/RHC in 1/19 showed normal PCWP and severely elevated PA pressure, no significant CAD.    At initial appointment in 1/19, she was noted to be hypoxemic with exertion and home oxygen was started for use with exertion.  I also started her on Opsumit. She had to stop this after about a week due to significantly increased exertional peripheral edema.  This resolved after Opsumit was stopped.   She returns today for followup of RV failure and pulmonary hypertension. Her weight is up 7 lbs.  She is on tadalafil 20 mg daily and just started Uptravi, now taking 200 mcg bid.  She is short of breath walking to the street and back up her driveway.  This is stable.  She occasionally gets lightheaded with standing.  BP is 96/56 today, she is on 3 different BP meds.  She is using oxygen with exertion (most of the time).  She feels like she is not diuresing well with current Lasix dose.   6 minute walk (1/19): 92 m, oxygen saturation dropped to 70%.    Labs (1/19): anti-Jo1 negative, RF borderline positive, SCL-70 negative, HIV negative. ESR 21. BNP 1129.  Labs (3/19): ANA + => anti-dsDNA negative, SSA/SSB negative, anti-Sm negative, anti-RNP negative. K 3.8, creatinine 1.4.   PMH: 1. PAD: Non-healing left ankle ulcer.  - ABIs 9/12 were normal.  2. Type 2 diabetes 3. Hypothyroidism 4. CKD stage 3 5. Hyperlipidemia 6. HTN 7. IBS 8. OSA: Mild, on CPAP.   9. Event monitor 5/18: No significant abnormality.  10. DVT in 1995 11. Pulmonary hypertension: Echo (4/18) with EF 65%, mild MR, mild TR, mild mitral stenosis, moderate pulmonary hypertension. - CTA chest 10/17: No PE.  - RHC (1/19): mean RA 11, PA 87/31 mean 51, mean PCWP 6, CI 3.58, PVR 7 WU.  - PFTs (1/19): mild obstruction, some restriction => severely decreased DLCO. - High resolution CT chest (1/19): no interstitial lung disease or emphysema. 7 mm nodule RUL.  - anti-Jo1 negative, RF borderline positive, SCL-70 negative, HIV negative. ANA + => anti-dsDNA negative, SSA/SSB negative, anti-Sm negative, anti-RNP negative. - V/Q scan 2/19: No e/o chronic PE.  12. Coronary angiography 11/18 without significant disease.  13. Mitral stenosis: Mild by cath in 1/19, mean gradient 2.6 mmHg.   SH: Lives in [place], 2 kids, nonsmoker, rare ETOH.   FH: No pulmonary HTN.  Father and brother with MIs  ROS: All systems reviewed and negative except as per HPI.   Current Outpatient Medications  Medication Sig Dispense Refill  . amLODipine (NORVASC) 5 MG tablet Take 0.5 tablets (2.5 mg total) by mouth daily. 45 tablet 3  . atorvastatin (LIPITOR) 20 MG tablet Take 20 mg by mouth every morning.    . carvedilol (COREG) 6.25 MG tablet Take 1 tablet (6.25 mg total) by mouth 2 (two) times daily with a meal. 60 tablet 3  . cetirizine (ZYRTEC) 10 MG tablet Take 10  mg by mouth daily as needed for allergies.    Marland Kitchen clopidogrel (PLAVIX) 75 MG tablet Take 75 mg by mouth daily.    . fenofibrate 160 MG tablet Take 160 mg by mouth every morning.    Marland Kitchen glimepiride (AMARYL) 4 MG tablet Take 4 mg by mouth daily with breakfast.    . levothyroxine (SYNTHROID, LEVOTHROID) 125 MCG tablet Take 125 mcg by mouth daily before breakfast.    . metFORMIN (GLUCOPHAGE-XR) 500 MG 24 hr tablet Take 500 mg by mouth daily with breakfast. Pt may take additional dose if needed    . Multiple Vitamins-Minerals (PRESERVISION AREDS 2  PO) Take 1 capsule by mouth 2 (two) times daily.    . pantoprazole (PROTONIX) 40 MG tablet Take 40 mg by mouth every morning.    . potassium chloride SA (K-DUR,KLOR-CON) 20 MEQ tablet Take 2 tablets (40 mEq total) by mouth daily. 180 tablet 3  . Selexipag (UPTRAVI) 200 MCG TABS Take 1 tablet (200 mcg total) by mouth 2 (two) times daily. 60 tablet 11  . tadalafil, PAH, (ADCIRCA) 20 MG tablet Take 20 mg by mouth daily.    Marland Kitchen torsemide (DEMADEX) 20 MG tablet Take 4 tablets (80 mg total) by mouth daily. 120 tablet 2   No current facility-administered medications for this encounter.    BP (!) 96/56   Pulse 71   Wt 171 lb (77.6 kg)   SpO2 100%   BMI 28.02 kg/m  General: NAD Neck: JVP 10-12 cm, no thyromegaly or thyroid nodule.  Lungs: Clear to auscultation bilaterally with normal respiratory effort. CV: Nondisplaced PMI.  Heart regular S1/S2, no S3/S4, no murmur.  1+ edema to knees bilaterally.  No carotid bruit.  Normal pedal pulses.  Abdomen: Soft, nontender, no hepatosplenomegaly, no distention.  Skin: Intact without lesions or rashes.  Neurologic: Alert and oriented x 3.  Psych: Normal affect. Extremities: No clubbing or cyanosis.  HEENT: Normal.   Assessment/Plan: 1. Pulmonary hypertension.  Severe PAH with PVR 7 WU on 1/19 RHC.  Etiology uncertain.  She has a history of DVT remotely but V/Q scan in 2/19 did not show evidence for chronic PE. She has OSA and uses CPAP.  Serologic workup showed only positive ANA but when additional serologies were done, they were all negative (see above).  CT chest with no ILD or emphysema.  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  She did not tolerate Opsumit due to significant lower extremity edema that resolved off Opsumit.  - Continue Adcirca 20 mg daily. Will not increase until she is better-diuresed. .   - She has been started on Uptravi which is at 200 mcg bid.  - With borderline positive RF, I still needs anti-CCP antibody.   - 6 minute walk needs to be done today.    - Due for echo, will get at followup.  - Continue to use O2 with exertion.  2. Syncope: 2 episodes in the last year.  Event monitor after first episode reportedly unremarkable.  - Could be related to severe pulmonary hypertension.  3. Mitral stenosis: Noted on echo and cath, mild.   4. H/o DVT: No chronic PE on V/Q scan.   5. Chronic diastolic CHF/RV failure: In setting of PAH and some degree of cor pulmonale.  She still looks volume overloaded on exam.  NYHA class III symptoms.  - Stop Lasix, start torsemide 80 mg daily.  BMET today and again in 10 days.   - Wear compression stockings during the day 6. HTN:  BP soft today again.   - Decrease Coreg to 6.25 mg bid.    - Stop losartan. 7. OSA: Continue CPAP.  8. Lung nodule: Non-contrast CT chest in 7/19.   9. CKD: Stage 3.  I will stop losartan with soft BP.  Followup in 2 wks with echo.   Loralie Champagne 11/27/2017

## 2017-11-27 NOTE — Patient Instructions (Signed)
Stop Furosemide  Stop[ Losartan  Start Torsemide 80 mg (4 tabs) daily  Decrease Coreg 6.25 mg (1 tab), twice a day  Rx for Compression stockings given  Use oxygen daily  Your physician has requested that you have an echocardiogram. Echocardiography is a painless test that uses sound waves to create images of your heart. It provides your doctor with information about the size and shape of your heart and how well your heart's chambers and valves are working. This procedure takes approximately one hour. There are no restrictions for this procedure.  Your physician recommends that you schedule a follow-up appointment in: 10-14 days with Dr. Aundra Dubin an a echocardiogram and lab work

## 2017-12-01 ENCOUNTER — Telehealth (HOSPITAL_COMMUNITY): Payer: Self-pay

## 2017-12-01 NOTE — Telephone Encounter (Signed)
Pt called triage and left VM about [employer] needs a call from our office for compression hose. Unable to reach pt I called [employer]. According to [employer]-insurance does not cover compression stockings. She can buy them at a discounted price of $53.00 a pair out of pocket.

## 2017-12-04 ENCOUNTER — Telehealth (HOSPITAL_COMMUNITY): Payer: Self-pay

## 2017-12-04 NOTE — Telephone Encounter (Signed)
Should not cause rectal bleeding.  This seems somewhat sudden so may be gastroenteritis or some other cause versus the medication.

## 2017-12-04 NOTE — Telephone Encounter (Signed)
Advanced Heart Failure Triage Encounter  Patient Name: Alisha [name]  Date of Call: 12/04/17  Problem:  Pt called about having frequent diarrhea over in then night with bright red rectal bleeding that was jelly form. She does take uptravi that could cause diarrhea tomorrow she will go up to 3 tabs. I did tell her to contact her PCP and/or GI MD for rectal bleeding. I will forward information to Dr. Aundra Dubin  And Doroteo Bradford, [place] D for additional orders.   Plan:    Shirley Muscat, RN

## 2017-12-05 NOTE — Telephone Encounter (Signed)
Called pt to check on her. She has appt w/GI in 2 days.

## 2017-12-05 NOTE — Telephone Encounter (Signed)
Pt aware of results 

## 2017-12-12 ENCOUNTER — Encounter (HOSPITAL_COMMUNITY): Payer: Self-pay | Admitting: Cardiology

## 2017-12-12 ENCOUNTER — Other Ambulatory Visit: Payer: Self-pay

## 2017-12-12 ENCOUNTER — Ambulatory Visit (HOSPITAL_COMMUNITY)
Admission: RE | Admit: 2017-12-12 | Discharge: 2017-12-12 | Disposition: A | Discharge status: Home / Self Care | Payer: Medicare Other | Source: Ambulatory Visit | Attending: Cardiology | Admitting: Cardiology

## 2017-12-12 ENCOUNTER — Ambulatory Visit (HOSPITAL_BASED_OUTPATIENT_CLINIC_OR_DEPARTMENT_OTHER)
Admission: RE | Admit: 2017-12-12 | Discharge: 2017-12-12 | Disposition: A | Discharge status: Home / Self Care | Payer: Medicare Other | Source: Ambulatory Visit | Attending: Cardiology | Admitting: Cardiology

## 2017-12-12 VITALS — BP 118/52 | HR 73 | Wt 169.8 lb

## 2017-12-12 DIAGNOSIS — I739 Peripheral vascular disease, unspecified: Secondary | ICD-10-CM | POA: Insufficient documentation

## 2017-12-12 DIAGNOSIS — E785 Hyperlipidemia, unspecified: Secondary | ICD-10-CM | POA: Insufficient documentation

## 2017-12-12 DIAGNOSIS — Z79899 Other long term (current) drug therapy: Secondary | ICD-10-CM | POA: Insufficient documentation

## 2017-12-12 DIAGNOSIS — G4733 Obstructive sleep apnea (adult) (pediatric): Secondary | ICD-10-CM | POA: Diagnosis not present

## 2017-12-12 DIAGNOSIS — Z7984 Long term (current) use of oral hypoglycemic drugs: Secondary | ICD-10-CM | POA: Diagnosis not present

## 2017-12-12 DIAGNOSIS — E039 Hypothyroidism, unspecified: Secondary | ICD-10-CM | POA: Diagnosis not present

## 2017-12-12 DIAGNOSIS — Z86718 Personal history of other venous thrombosis and embolism: Secondary | ICD-10-CM | POA: Insufficient documentation

## 2017-12-12 DIAGNOSIS — K589 Irritable bowel syndrome without diarrhea: Secondary | ICD-10-CM | POA: Insufficient documentation

## 2017-12-12 DIAGNOSIS — I5032 Chronic diastolic (congestive) heart failure: Secondary | ICD-10-CM | POA: Diagnosis not present

## 2017-12-12 DIAGNOSIS — N183 Chronic kidney disease, stage 3 (moderate): Secondary | ICD-10-CM | POA: Diagnosis not present

## 2017-12-12 DIAGNOSIS — E1122 Type 2 diabetes mellitus with diabetic chronic kidney disease: Secondary | ICD-10-CM | POA: Diagnosis not present

## 2017-12-12 DIAGNOSIS — I272 Pulmonary hypertension, unspecified: Secondary | ICD-10-CM

## 2017-12-12 DIAGNOSIS — I080 Rheumatic disorders of both mitral and aortic valves: Secondary | ICD-10-CM | POA: Insufficient documentation

## 2017-12-12 DIAGNOSIS — R911 Solitary pulmonary nodule: Secondary | ICD-10-CM | POA: Insufficient documentation

## 2017-12-12 DIAGNOSIS — I083 Combined rheumatic disorders of mitral, aortic and tricuspid valves: Secondary | ICD-10-CM | POA: Diagnosis not present

## 2017-12-12 DIAGNOSIS — I130 Hypertensive heart and chronic kidney disease with heart failure and stage 1 through stage 4 chronic kidney disease, or unspecified chronic kidney disease: Secondary | ICD-10-CM | POA: Diagnosis not present

## 2017-12-12 LAB — BASIC METABOLIC PANEL
ANION GAP: 10 (ref 5–15)
BUN: 17 mg/dL (ref 6–20)
CHLORIDE: 106 mmol/L (ref 101–111)
CO2: 22 mmol/L (ref 22–32)
Calcium: 8.7 mg/dL — ABNORMAL LOW (ref 8.9–10.3)
Creatinine, Ser: 1.34 mg/dL — ABNORMAL HIGH (ref 0.44–1.00)
GFR calc Af Amer: 44 mL/min — ABNORMAL LOW (ref >60)
GFR calc non Af Amer: 38 mL/min — ABNORMAL LOW (ref >60)
GLUCOSE: 102 mg/dL — AB (ref 65–99)
POTASSIUM: 3.4 mmol/L — AB (ref 3.5–5.1)
Sodium: 138 mmol/L (ref 135–145)

## 2017-12-12 LAB — BRAIN NATRIURETIC PEPTIDE: B Natriuretic Peptide: 1902.9 pg/mL — ABNORMAL HIGH (ref 0.0–100.0)

## 2017-12-12 MED ORDER — TORSEMIDE 20 MG PO TABS: ORAL_TABLET | ORAL | 2 refills | Status: DC

## 2017-12-12 NOTE — Progress Notes (Signed)
Echocardiogram 2D Echocardiogram has been performed.  Matilde Bash 12/12/2017, 11:01 AM

## 2017-12-12 NOTE — Progress Notes (Signed)
Pt completed 6 minute walk test. Pt walked 400 ft (122 meters). O2 sats ranged from 99%-78% on 4 L, and HR ranged from 81-96. No knew orders at this time.

## 2017-12-12 NOTE — Progress Notes (Signed)
PCP: Dr. Dimas Aguas Cardiology: Dr. Wynonia Lawman HF Cardiology: Dr. Aundra Dubin  77 yo with history of diabetes, HTN, PAD, hyperlipidemia who was referred by Dr. Wynonia Lawman for evaluation of pulmonary hypertension.   For > 1 year, she has had significant exertional dyspnea. It has been steadily worsening, especially over the last few months.  She has been extensively worked up so far.  Echo in 4/18 showed preserved EF 65% with moderate pulmonary hypertension.  She had episodes of syncope in 5/18 and 10/18.  She wore an event monitor in 5/18 with no significant arrhythmia.  LHC/RHC in 1/19 showed normal PCWP and severely elevated PA pressure, no significant CAD.    At initial appointment in 1/19, she was noted to be hypoxemic with exertion and home oxygen was started for use with exertion.  I also started her on Opsumit. She had to stop this after about a week due to significantly increased exertional peripheral edema.  This resolved after Opsumit was stopped.   Echo was done today.  This showed EF 60-65%, mild MS, mild AS, moderately dilated RV with mildly decreased systolic function.   She returns today for followup of RV failure and pulmonary hypertension.  At last appointment, we changed her to torsemide from Lasix.  Weight is down 2 lbs.  She does not feel much different.  Still short of breath making up the bed or walking around the house.  She also gets chest pressure with moderate activity along with dyspnea.  She has bendopnea.  Very short of breath walking up her driveway.  +orthopnea, no PND.  She just increased selexipag to 800 mcg bid.   6 minute walk (1/19): 92 m, oxygen saturation dropped to 70%.   6 minute walk (3/19): 122 m  Labs (1/19): anti-Jo1 negative, RF borderline positive, SCL-70 negative, HIV negative. ESR 21. BNP 1129.  Labs (3/19): ANA + => anti-dsDNA negative, SSA/SSB negative, anti-Sm negative, anti-RNP negative. K 3.8 => 3.7, creatinine 1.4 => 1.55.   PMH: 1. PAD: Non-healing left  ankle ulcer.  - ABIs 9/12 were normal.  2. Type 2 diabetes 3. Hypothyroidism 4. CKD stage 3 5. Hyperlipidemia 6. HTN 7. IBS 8. OSA: Mild, on CPAP.  9. Event monitor 5/18: No significant abnormality.  10. DVT in 1995 11. Pulmonary hypertension: Echo (4/18) with EF 65%, mild MR, mild TR, mild mitral stenosis, moderate pulmonary hypertension. - CTA chest 10/17: No PE.  - RHC (1/19): mean RA 11, PA 87/31 mean 51, mean PCWP 6, CI 3.58, PVR 7 WU.  - PFTs (1/19): mild obstruction, some restriction => severely decreased DLCO. - High resolution CT chest (1/19): no interstitial lung disease or emphysema. 7 mm nodule RUL.  - anti-Jo1 negative, RF borderline positive, SCL-70 negative, HIV negative. ANA + => anti-dsDNA negative, SSA/SSB negative, anti-Sm negative, anti-RNP negative. - V/Q scan 2/19: No e/o chronic PE.  - Echo (4/19): EF 60-65%, mild LVH, mild aortic stenosis, mild mitral stenosis, moderately dilated RV with mildly decreased systolic function, PASP 36 mmHg (poor envelope, may be underestimated).  12. Coronary angiography 11/18 without significant disease.  13. Mitral stenosis: Mild by cath in 1/19, mean gradient 2.6 mmHg. Mild on 4/19 echo.  14. Aortic stenosis: Mild on 4/19 echo.   SH: Lives in [place], 2 kids, nonsmoker, rare ETOH.   FH: No pulmonary HTN.  Father and brother with MIs  ROS: All systems reviewed and negative except as per HPI.   Current Outpatient Medications  Medication Sig Dispense Refill  . amLODipine (  NORVASC) 5 MG tablet Take 0.5 tablets (2.5 mg total) by mouth daily. 45 tablet 3  . atorvastatin (LIPITOR) 20 MG tablet Take 20 mg by mouth every morning.    . carvedilol (COREG) 6.25 MG tablet Take 1 tablet (6.25 mg total) by mouth 2 (two) times daily with a meal. 60 tablet 3  . cetirizine (ZYRTEC) 10 MG tablet Take 10 mg by mouth daily as needed for allergies.    Marland Kitchen clopidogrel (PLAVIX) 75 MG tablet Take 75 mg by mouth daily.    . fenofibrate 160 MG  tablet Take 160 mg by mouth every morning.    Marland Kitchen glimepiride (AMARYL) 4 MG tablet Take 4 mg by mouth daily with breakfast.    . levothyroxine (SYNTHROID, LEVOTHROID) 125 MCG tablet Take 125 mcg by mouth daily before breakfast.    . metFORMIN (GLUCOPHAGE-XR) 500 MG 24 hr tablet Take 500 mg by mouth daily with breakfast. Pt may take additional dose if needed    . Multiple Vitamins-Minerals (PRESERVISION AREDS 2 PO) Take 1 capsule by mouth 2 (two) times daily.    . pantoprazole (PROTONIX) 40 MG tablet Take 40 mg by mouth every morning.    . potassium chloride SA (K-DUR,KLOR-CON) 20 MEQ tablet Take 2 tablets (40 mEq total) by mouth daily. 180 tablet 3  . Selexipag (UPTRAVI) 200 MCG TABS Take 800 mcg by mouth 2 (two) times daily.    . tadalafil, PAH, (ADCIRCA) 20 MG tablet Take 20 mg by mouth daily.    Marland Kitchen torsemide (DEMADEX) 20 MG tablet Take 4 tablets (80 mg total) by mouth every morning AND 2 tablets (40 mg total) every evening. 180 tablet 2   No current facility-administered medications for this encounter.    BP (!) 118/52   Pulse 73   Wt 169 lb 12 oz (77 kg)   SpO2 96%   BMI 27.82 kg/m  General: NAD Neck: JVP 12 cm, no thyromegaly or thyroid nodule.  Lungs: Clear to auscultation bilaterally with normal respiratory effort. CV: Nondisplaced PMI.  Heart regular S1/S2, no S3/S4, no murmur.  1+ edema to knees bilaterally.  No carotid bruit.  Normal pedal pulses.  Abdomen: Soft, nontender, no hepatosplenomegaly, no distention.  Skin: Intact without lesions or rashes.  Neurologic: Alert and oriented x 3.  Psych: Normal affect. Extremities: No clubbing or cyanosis.  HEENT: Normal.   Assessment/Plan: 1. Pulmonary hypertension:  Severe PAH with PVR 7 WU on 1/19 RHC.  Etiology uncertain.  She has a history of DVT remotely but V/Q scan in 2/19 did not show evidence for chronic PE. She has OSA and uses CPAP.  Serologic workup showed only positive ANA but when additional serologies were done, they were  all negative (see above).  CT chest with no ILD or emphysema.  Valvular heart disease does not appear significant enough to cause this degree of PH.  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  She did not tolerate Opsumit due to significant lower extremity edema that resolved off Opsumit.  6 minute walk today is mildly better than prior but still poor.  Echo was done today and reviewed, the RV is moderately dilated with mildly decreased systolic function.  PA systolic pressure estimation is only 36 mmHg but poor envelope and likely underestimated.  - Continue Adcirca 20 mg daily. Will not increase until she is better-diuresed.    - She has been started on Uptravi which is being titrated up.   - With borderline positive RF, I still needs anti-CCP antibody =>  appears to be pending.    - Continue to use O2 with exertion.  2. Syncope: 2 episodes in the last year.  Event monitor after first episode reportedly unremarkable. No recent recurrence.  - Could be related to severe pulmonary hypertension.  3. Mitral stenosis: Noted on echo and cath, mild.   4. H/o DVT: No chronic PE on V/Q scan.   5. Chronic diastolic CHF/RV failure: In setting of PAH and some degree of cor pulmonale.  Still volume overloaded, has been difficult to diurese. NYHA class IIIb symptoms.  - Increase torsemide to 80 mg bid x 2 days then 80 qam/40 qpm after that. BMET today and in 10 days.    - Wear compression stockings during the day 6. HTN: BP better after cutting back on BP meds.    7. OSA: Continue CPAP.  8. Lung nodule: Non-contrast CT chest in 7/19.   9. CKD: Stage 3.  BMET today.   Followup in 2 wks with NP/PA.  If volume status does not improve, she will need to be admitted for IV diuresis. I offered her this today but she wants to try to hold off.   Loralie Champagne 12/12/2017

## 2017-12-12 NOTE — Patient Instructions (Signed)
Increase Torsemide 80 mg (4 tabs), twice a day for 2 days  -Then 80 mg (4 tabs) in AM, then 40 mg (2 tabs) in PM   If not feeling better call we may admit you into the hospital  You have been referred to pulmonary rehab in [place] (they will call you)   We did a 6 minute walk  Labs drawn today (if we do not call you, then your lab work was stable)   Your physician recommends that you schedule a follow-up appointment in: 10-14 days with APP and labs

## 2017-12-14 ENCOUNTER — Telehealth (HOSPITAL_COMMUNITY): Payer: Self-pay

## 2017-12-14 NOTE — Telephone Encounter (Signed)
Advanced Heart Failure Triage Encounter  Patient Name: Alisha [name]  Date of Call: 12/14/17  Problem:  Pt called b/c her PCP took labs and said her Hgb was around 7."something" (low) and she was being referred to GI and wanted all her MD's to be aware. Assured her that they will contact us for clearance for a colonoscopy. Also wanted to make sure she did not have to do any med changes with Uptravi. Spoke with Abbe Amsterdam D  Who said it was okay to continue titration.    Shirley Muscat, RN

## 2017-12-18 DIAGNOSIS — I8001 Phlebitis and thrombophlebitis of superficial vessels of right lower extremity: Secondary | ICD-10-CM | POA: Insufficient documentation

## 2017-12-19 ENCOUNTER — Other Ambulatory Visit (HOSPITAL_COMMUNITY): Payer: Self-pay | Admitting: Cardiology

## 2017-12-22 ENCOUNTER — Ambulatory Visit (HOSPITAL_COMMUNITY)
Admission: RE | Admit: 2017-12-22 | Discharge: 2017-12-22 | Disposition: A | Discharge status: Home / Self Care | Payer: Medicare Other | Source: Ambulatory Visit | Attending: Cardiology | Admitting: Cardiology

## 2017-12-22 VITALS — BP 130/74 | HR 78 | Wt 162.0 lb

## 2017-12-22 DIAGNOSIS — I272 Pulmonary hypertension, unspecified: Secondary | ICD-10-CM | POA: Diagnosis not present

## 2017-12-22 DIAGNOSIS — I130 Hypertensive heart and chronic kidney disease with heart failure and stage 1 through stage 4 chronic kidney disease, or unspecified chronic kidney disease: Secondary | ICD-10-CM | POA: Diagnosis not present

## 2017-12-22 DIAGNOSIS — R55 Syncope and collapse: Secondary | ICD-10-CM

## 2017-12-22 DIAGNOSIS — D508 Other iron deficiency anemias: Secondary | ICD-10-CM | POA: Diagnosis not present

## 2017-12-22 DIAGNOSIS — N183 Chronic kidney disease, stage 3 unspecified: Secondary | ICD-10-CM

## 2017-12-22 DIAGNOSIS — Z9989 Dependence on other enabling machines and devices: Secondary | ICD-10-CM | POA: Diagnosis not present

## 2017-12-22 DIAGNOSIS — Z86718 Personal history of other venous thrombosis and embolism: Secondary | ICD-10-CM | POA: Diagnosis not present

## 2017-12-22 DIAGNOSIS — K589 Irritable bowel syndrome without diarrhea: Secondary | ICD-10-CM | POA: Insufficient documentation

## 2017-12-22 DIAGNOSIS — E1122 Type 2 diabetes mellitus with diabetic chronic kidney disease: Secondary | ICD-10-CM | POA: Insufficient documentation

## 2017-12-22 DIAGNOSIS — D649 Anemia, unspecified: Secondary | ICD-10-CM | POA: Diagnosis not present

## 2017-12-22 DIAGNOSIS — E039 Hypothyroidism, unspecified: Secondary | ICD-10-CM | POA: Diagnosis not present

## 2017-12-22 DIAGNOSIS — E785 Hyperlipidemia, unspecified: Secondary | ICD-10-CM | POA: Insufficient documentation

## 2017-12-22 DIAGNOSIS — G4733 Obstructive sleep apnea (adult) (pediatric): Secondary | ICD-10-CM

## 2017-12-22 DIAGNOSIS — Z79899 Other long term (current) drug therapy: Secondary | ICD-10-CM | POA: Diagnosis not present

## 2017-12-22 DIAGNOSIS — I080 Rheumatic disorders of both mitral and aortic valves: Secondary | ICD-10-CM | POA: Diagnosis not present

## 2017-12-22 DIAGNOSIS — I5032 Chronic diastolic (congestive) heart failure: Secondary | ICD-10-CM | POA: Diagnosis not present

## 2017-12-22 DIAGNOSIS — Z7984 Long term (current) use of oral hypoglycemic drugs: Secondary | ICD-10-CM | POA: Insufficient documentation

## 2017-12-22 LAB — BASIC METABOLIC PANEL
ANION GAP: 12 (ref 5–15)
BUN: 20 mg/dL (ref 6–20)
CALCIUM: 8.6 mg/dL — AB (ref 8.9–10.3)
CO2: 19 mmol/L — AB (ref 22–32)
Chloride: 107 mmol/L (ref 101–111)
Creatinine, Ser: 1.58 mg/dL — ABNORMAL HIGH (ref 0.44–1.00)
GFR calc non Af Amer: 31 mL/min — ABNORMAL LOW (ref >60)
GFR, EST AFRICAN AMERICAN: 36 mL/min — AB (ref >60)
Glucose, Bld: 206 mg/dL — ABNORMAL HIGH (ref 65–99)
Potassium: 4.1 mmol/L (ref 3.5–5.1)
Sodium: 138 mmol/L (ref 135–145)

## 2017-12-22 MED ORDER — TORSEMIDE 20 MG PO TABS: ORAL_TABLET | ORAL | 11 refills | Status: DC

## 2017-12-22 NOTE — Patient Instructions (Signed)
INCREASE afternoon dose of Torsemide. Take 80 mg (4 tabs) in am and 60 mg (3 tabs) in pm  Routine lab work today. Will notify you of abnormal results, otherwise no news is good news!  Follow up 3 weeks with Dr. Aundra Dubin.  _______________________________________________________ Marin Roberts Code:  1200  Take all medication as prescribed the day of your appointment. Bring all medications with you to your appointment.  Do the following things EVERYDAY: 1) Weigh yourself in the morning before breakfast. Write it down and keep it in a log. 2) Take your medicines as prescribed 3) Eat low salt foods-Limit salt (sodium) to 2000 mg per day.  4) Stay as active as you can everyday 5) Limit all fluids for the day to less than 2 liters

## 2017-12-22 NOTE — Progress Notes (Signed)
PCP: Dr. Dimas Aguas Cardiology: Dr. Wynonia Lawman HF Cardiology: Dr. Aundra Dubin  77 yo with history of diabetes, HTN, PAD, hyperlipidemia who was referred by Dr. Wynonia Lawman for evaluation of pulmonary hypertension.   For > 1 year, she has had significant exertional dyspnea. It has been steadily worsening, especially over the last few months.  She has been extensively worked up so far.  Echo in 4/18 showed preserved EF 65% with moderate pulmonary hypertension.  She had episodes of syncope in 5/18 and 10/18.  She wore an event monitor in 5/18 with no significant arrhythmia.  LHC/RHC in 1/19 showed normal PCWP and severely elevated PA pressure, no significant CAD.    At initial appointment in 1/19, she was noted to be hypoxemic with exertion and home oxygen was started for use with exertion.  I also started her on Opsumit. She had to stop this after about a week due to significantly increased exertional peripheral edema.  This resolved after Opsumit was stopped.   Echo 11/2017   This showed EF 60-65%, mild MS, mild AS, moderately dilated RV with mildly decreased systolic function.   Today she returns for HF/PAH follow up. Last visit diuretics increased due to volume overload. She saw  PCP and had lab work that showed hemoglobin < 8. Denies bleeding.  Overall feeling fine. SOB with exertion. Has presyncope when she doesn't wear oxygen. Wears oxygen most of the time.  Denies PND/Orthopnea. Complaining of leg edema. Appetite ok. No fever or chills. Weight at home  160-161 pounds. Taking all medications. Last week she went up 1000 mcg twice a day. She has not noticed any side effects.   6 minute walk (1/19): 92 m, oxygen saturation dropped to 70%.   6 minute walk (3/19): 122 m  Labs (1/19): anti-Jo1 negative, RF borderline positive, SCL-70 negative, HIV negative. ESR 21. BNP 1129.  Labs (3/19): ANA + => anti-dsDNA negative, SSA/SSB negative, anti-Sm negative, anti-RNP negative. K 3.8 => 3.7, creatinine 1.4 => 1.55.  Lab  (12/12/2017): K 3.4 Creatinine 1.34 BNP 1902   PMH: 1. PAD: Non-healing left ankle ulcer.  - ABIs 9/12 were normal.  2. Type 2 diabetes 3. Hypothyroidism 4. CKD stage 3 5. Hyperlipidemia 6. HTN 7. IBS 8. OSA: Mild, on CPAP.  9. Event monitor 5/18: No significant abnormality.  10. DVT in 1995 11. Pulmonary hypertension: Echo (4/18) with EF 65%, mild MR, mild TR, mild mitral stenosis, moderate pulmonary hypertension. - CTA chest 10/17: No PE.  - RHC (1/19): mean RA 11, PA 87/31 mean 51, mean PCWP 6, CI 3.58, PVR 7 WU.  - PFTs (1/19): mild obstruction, some restriction => severely decreased DLCO. - High resolution CT chest (1/19): no interstitial lung disease or emphysema. 7 mm nodule RUL.  - anti-Jo1 negative, RF borderline positive, SCL-70 negative, HIV negative. ANA + => anti-dsDNA negative, SSA/SSB negative, anti-Sm negative, anti-RNP negative. - V/Q scan 2/19: No e/o chronic PE.  - Echo (4/19): EF 60-65%, mild LVH, mild aortic stenosis, mild mitral stenosis, moderately dilated RV with mildly decreased systolic function, PASP 36 mmHg (poor envelope, may be underestimated).  12. Coronary angiography 11/18 without significant disease.  13. Mitral stenosis: Mild by cath in 1/19, mean gradient 2.6 mmHg. Mild on 4/19 echo.  14. Aortic stenosis: Mild on 4/19 echo.   SH: Lives in [place], 2 kids, nonsmoker, rare ETOH.   FH: No pulmonary HTN.  Father and brother with MIs  ROS: All systems reviewed and negative except as per HPI.   Current  Outpatient Medications  Medication Sig Dispense Refill  . amLODipine (NORVASC) 5 MG tablet Take 0.5 tablets (2.5 mg total) by mouth daily. 45 tablet 3  . atorvastatin (LIPITOR) 20 MG tablet Take 20 mg by mouth every morning.    . carvedilol (COREG) 6.25 MG tablet Take 1 tablet (6.25 mg total) by mouth 2 (two) times daily with a meal. 60 tablet 3  . cetirizine (ZYRTEC) 10 MG tablet Take 10 mg by mouth daily as needed for allergies.    . fenofibrate  160 MG tablet Take 160 mg by mouth every morning.    Marland Kitchen glimepiride (AMARYL) 4 MG tablet Take 4 mg by mouth daily with breakfast.    . levothyroxine (SYNTHROID, LEVOTHROID) 125 MCG tablet Take 125 mcg by mouth daily before breakfast.    . metFORMIN (GLUCOPHAGE-XR) 500 MG 24 hr tablet Take 500 mg by mouth daily with breakfast. Pt may take additional dose if needed    . Multiple Vitamins-Minerals (PRESERVISION AREDS 2 PO) Take 1 capsule by mouth 2 (two) times daily.    . pantoprazole (PROTONIX) 40 MG tablet Take 40 mg by mouth every morning.    . potassium chloride SA (K-DUR,KLOR-CON) 20 MEQ tablet Take 2 tablets (40 mEq total) by mouth daily. 180 tablet 3  . Selexipag (UPTRAVI) 200 MCG TABS Take 800 mcg by mouth 2 (two) times daily.    . tadalafil, PAH, (ADCIRCA) 20 MG tablet Take 20 mg by mouth daily.    Marland Kitchen torsemide (DEMADEX) 20 MG tablet Take 4 tablets (80 mg total) by mouth every morning AND 2 tablets (40 mg total) every evening. 180 tablet 2   No current facility-administered medications for this encounter.    BP 130/74 (BP Location: Right Arm, Patient Position: Sitting, Cuff Size: Normal)   Pulse 78   Wt 162 lb (73.5 kg)   SpO2 96% Comment: 3 liters continuous O2 via [place]  BMI 26.55 kg/m  General:  No resp difficulty. NAD. Arrived in a wheel chair with her daughter.  HEENT: normal Neck: supple. JVP 10-11. Carotids 2+ bilat; no bruits. No lymphadenopathy or thryomegaly appreciated. Cor: PMI nondisplaced. Regular rate & rhythm. No rubs, gallops or murmurs. Lungs: clear Abdomen: soft, nontender, nondistended. No hepatosplenomegaly. No bruits or masses. Good bowel sounds. Extremities: no cyanosis, clubbing, rash, R and LLE 1-2+ edema Neuro: alert & orientedx3, cranial nerves grossly intact. moves all 4 extremities w/o difficulty. Affect pleasant  Assessment/Plan: 1. Pulmonary hypertension:  Severe PAH with PVR 7 WU on 1/19 RHC.  Etiology uncertain.  She has a history of DVT remotely but V/Q  scan in 2/19 did not show evidence for chronic PE. She has OSA and uses CPAP.  Serologic workup showed only positive ANA but when additional serologies were done, they were all negative (see above).  CT chest with no ILD or emphysema.  Valvular heart disease does not appear significant enough to cause this degree of PH.  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  She did not tolerate Opsumit due to significant lower extremity edema that resolved off Opsumit.  6 minute walk today is mildly better than prior but still poor.  Echo was done today and reviewed, the RV is moderately dilated with mildly decreased systolic function.  PA systolic pressure estimation is only 36 mmHg but poor envelope and likely underestimated.  -NYHA IIIb - Continue Adcirca 20 mg daily. Will not increase until she is better-diuresed.    - She has been started on Uptravi which is being titrated up.  Tolerating 1000 mcg/1074mg without side effects. - With borderline positive RF, anti-CCP antibody => appears to be pending.    - Continue to use O2 with exertion.  2. Syncope: 2 episodes in the last year.  Event monitor after first episode reportedly unremarkable. No recent recurrence. Reinforced continuous oxygen  - Could be related to severe pulmonary hypertension.  3. Mitral stenosis: Noted on echo and cath, mild.   4. H/o DVT: No chronic PE on V/Q scan.   5. Chronic diastolic CHF/RV failure: In setting of PAH and some degree of cor pulmonale.  Still volume overloaded, has been difficult to diurese. NYHA class IIIb symptoms.  - Volume status mildly elevated.  - Continue torsemide 80 mg in am and increase pm dose to 60 mg.  - Wear compression stockings during the day 6. HTN: Stable.  7. OSA: Continue CPAP.  8. Lung nodule: Non-contrast CT chest in 7/19.   9. CKD: Stage 3- check BMET today.  10. Anemia-  [facility] 8 [facility]   Follow up in 3 weeks. Check BMET today. Reinforced wearing oxygen  24 hours a day.     Terilyn Sano NP-C  12/22/2017

## 2018-01-03 DIAGNOSIS — D126 Benign neoplasm of colon, unspecified: Secondary | ICD-10-CM | POA: Insufficient documentation

## 2018-01-08 ENCOUNTER — Encounter (HOSPITAL_COMMUNITY): Payer: Self-pay | Admitting: Cardiology

## 2018-01-08 ENCOUNTER — Other Ambulatory Visit: Payer: Self-pay

## 2018-01-08 ENCOUNTER — Encounter (HOSPITAL_COMMUNITY): Payer: Self-pay | Admitting: *Deleted

## 2018-01-08 ENCOUNTER — Ambulatory Visit (HOSPITAL_COMMUNITY)
Admission: RE | Admit: 2018-01-08 | Discharge: 2018-01-08 | Disposition: A | Discharge status: Home / Self Care | Payer: Medicare Other | Source: Ambulatory Visit | Attending: Cardiology | Admitting: Cardiology

## 2018-01-08 VITALS — BP 130/66 | HR 90 | Wt 164.0 lb

## 2018-01-08 DIAGNOSIS — E039 Hypothyroidism, unspecified: Secondary | ICD-10-CM | POA: Diagnosis not present

## 2018-01-08 DIAGNOSIS — E785 Hyperlipidemia, unspecified: Secondary | ICD-10-CM | POA: Insufficient documentation

## 2018-01-08 DIAGNOSIS — I272 Pulmonary hypertension, unspecified: Secondary | ICD-10-CM | POA: Diagnosis not present

## 2018-01-08 DIAGNOSIS — Z86718 Personal history of other venous thrombosis and embolism: Secondary | ICD-10-CM | POA: Insufficient documentation

## 2018-01-08 DIAGNOSIS — G4733 Obstructive sleep apnea (adult) (pediatric): Secondary | ICD-10-CM | POA: Insufficient documentation

## 2018-01-08 DIAGNOSIS — K589 Irritable bowel syndrome without diarrhea: Secondary | ICD-10-CM | POA: Insufficient documentation

## 2018-01-08 DIAGNOSIS — Z79890 Hormone replacement therapy (postmenopausal): Secondary | ICD-10-CM | POA: Insufficient documentation

## 2018-01-08 DIAGNOSIS — N183 Chronic kidney disease, stage 3 (moderate): Secondary | ICD-10-CM | POA: Diagnosis not present

## 2018-01-08 DIAGNOSIS — E1122 Type 2 diabetes mellitus with diabetic chronic kidney disease: Secondary | ICD-10-CM | POA: Diagnosis not present

## 2018-01-08 DIAGNOSIS — Z8249 Family history of ischemic heart disease and other diseases of the circulatory system: Secondary | ICD-10-CM | POA: Diagnosis not present

## 2018-01-08 DIAGNOSIS — Z7984 Long term (current) use of oral hypoglycemic drugs: Secondary | ICD-10-CM | POA: Insufficient documentation

## 2018-01-08 DIAGNOSIS — I5032 Chronic diastolic (congestive) heart failure: Secondary | ICD-10-CM

## 2018-01-08 DIAGNOSIS — D509 Iron deficiency anemia, unspecified: Secondary | ICD-10-CM | POA: Insufficient documentation

## 2018-01-08 DIAGNOSIS — Z79899 Other long term (current) drug therapy: Secondary | ICD-10-CM | POA: Insufficient documentation

## 2018-01-08 DIAGNOSIS — Z9981 Dependence on supplemental oxygen: Secondary | ICD-10-CM | POA: Diagnosis not present

## 2018-01-08 DIAGNOSIS — R55 Syncope and collapse: Secondary | ICD-10-CM | POA: Diagnosis not present

## 2018-01-08 DIAGNOSIS — I130 Hypertensive heart and chronic kidney disease with heart failure and stage 1 through stage 4 chronic kidney disease, or unspecified chronic kidney disease: Secondary | ICD-10-CM | POA: Diagnosis not present

## 2018-01-08 DIAGNOSIS — R918 Other nonspecific abnormal finding of lung field: Secondary | ICD-10-CM | POA: Insufficient documentation

## 2018-01-08 DIAGNOSIS — I080 Rheumatic disorders of both mitral and aortic valves: Secondary | ICD-10-CM | POA: Insufficient documentation

## 2018-01-08 LAB — BASIC METABOLIC PANEL
ANION GAP: 10 (ref 5–15)
BUN: 19 mg/dL (ref 6–20)
CHLORIDE: 107 mmol/L (ref 101–111)
CO2: 22 mmol/L (ref 22–32)
Calcium: 8.4 mg/dL — ABNORMAL LOW (ref 8.9–10.3)
Creatinine, Ser: 1.22 mg/dL — ABNORMAL HIGH (ref 0.44–1.00)
GFR calc non Af Amer: 42 mL/min — ABNORMAL LOW (ref >60)
GFR, EST AFRICAN AMERICAN: 49 mL/min — AB (ref >60)
Glucose, Bld: 130 mg/dL — ABNORMAL HIGH (ref 65–99)
POTASSIUM: 3.1 mmol/L — AB (ref 3.5–5.1)
SODIUM: 139 mmol/L (ref 135–145)

## 2018-01-08 MED ORDER — TORSEMIDE 20 MG PO TABS: 80.0000 mg | ORAL_TABLET | Freq: Two times a day (BID) | ORAL | 11 refills | Status: DC

## 2018-01-08 NOTE — Progress Notes (Signed)
Pt's RTW letter completed and emailed to pt at [email or web address] per her request

## 2018-01-08 NOTE — Patient Instructions (Signed)
INCREASE Torsemide to 80mg  twice daily.  Follow up with Dr.McLean in 1 month,

## 2018-01-08 NOTE — Progress Notes (Signed)
6 min walk test completed.  Pt ambualted 1120 ft (341 m).  O2 sats ranged 97-92% on RA, HR ranged 81-121.

## 2018-01-09 ENCOUNTER — Other Ambulatory Visit (HOSPITAL_COMMUNITY): Payer: Self-pay

## 2018-01-09 LAB — CYCLIC CITRUL PEPTIDE ANTIBODY, IGG/IGA: CCP Antibodies IgG/IgA: 5 units (ref 0–19)

## 2018-01-09 MED ORDER — POTASSIUM CHLORIDE ER 10 MEQ PO TBCR: 40.0000 meq | EXTENDED_RELEASE_TABLET | Freq: Every day | ORAL | 3 refills | Status: DC

## 2018-01-09 NOTE — Progress Notes (Signed)
PCP: Dr. Dimas Aguas Cardiology: Dr. Wynonia Lawman HF Cardiology: Dr. Aundra Dubin  77 yo with history of diabetes, HTN, PAD, hyperlipidemia who was referred by Dr. Wynonia Lawman for evaluation of pulmonary hypertension.   For > 1 year, she has had significant exertional dyspnea. It has been steadily worsening, especially over the last few months.  She has been extensively worked up so far.  Echo in 4/18 showed preserved EF 65% with moderate pulmonary hypertension.  She had episodes of syncope in 5/18 and 10/18.  She wore an event monitor in 5/18 with no significant arrhythmia.  LHC/RHC in 1/19 showed normal PCWP and severely elevated PA pressure, no significant CAD.    At initial appointment in 1/19, she was noted to be hypoxemic with exertion and home oxygen was started for use with exertion.  I also started her on Opsumit. She had to stop this after about a week due to significantly increased exertional peripheral edema.  This resolved after Opsumit was stopped.   Echo in 4/19.  This showed EF 60-65%, mild MS, mild AS, moderately dilated RV with mildly decreased systolic function.   She was found to have Fe deficiency anemia, FOBT+.  EGD showed gastritis and c-scope showed diverticulosis and polyps in 4/19.  She get IV Fe.  She is going to have a capsule endoscopy.   She returns today for followup of RV failure and pulmonary hypertension.  We have finally seemed to turn a corner.  Weight has trended down.  Breathing is much better.  She walked in on her own without oxygen today. She is going out more.  Able to walk on flat ground now without dyspnea.  No chest pain. No orthopnea/PND.  No lightheadedness.   6 minute walk (1/19): 92 m, oxygen saturation dropped to 70%.   6 minute walk (3/19): 122 m 6 minute walk (4/19): 392 m  Labs (1/19): anti-Jo1 negative, RF borderline positive, SCL-70 negative, HIV negative. ESR 21. BNP 1129.  Labs (3/19): ANA + => anti-dsDNA negative, SSA/SSB negative, anti-Sm negative, anti-RNP  negative. K 3.8 => 3.7, creatinine 1.4 => 1.55.  Labs (4/19): K 4.1, creatinine 1.58, BNP 1902  PMH: 1. PAD: Non-healing left ankle ulcer.  - ABIs 9/12 were normal.  2. Type 2 diabetes 3. Hypothyroidism 4. CKD stage 3 5. Hyperlipidemia 6. HTN 7. IBS 8. OSA: Mild, on CPAP.  9. Event monitor 5/18: No significant abnormality.  10. DVT in 1995 11. Pulmonary hypertension: Echo (4/18) with EF 65%, mild MR, mild TR, mild mitral stenosis, moderate pulmonary hypertension. - CTA chest 10/17: No PE.  - RHC (1/19): mean RA 11, PA 87/31 mean 51, mean PCWP 6, CI 3.58, PVR 7 WU.  - PFTs (1/19): mild obstruction, some restriction => severely decreased DLCO. - High resolution CT chest (1/19): no interstitial lung disease or emphysema. 7 mm nodule RUL.  - anti-Jo1 negative, RF borderline positive, SCL-70 negative, HIV negative. ANA + => anti-dsDNA negative, SSA/SSB negative, anti-Sm negative, anti-RNP negative. - V/Q scan 2/19: No e/o chronic PE.  - Echo (4/19): EF 60-65%, mild LVH, mild aortic stenosis, mild mitral stenosis, moderately dilated RV with mildly decreased systolic function, PASP 36 mmHg (poor envelope, may be underestimated).  12. Coronary angiography 11/18 without significant disease.  13. Mitral stenosis: Mild by cath in 1/19, mean gradient 2.6 mmHg. Mild on 4/19 echo.  14. Aortic stenosis: Mild on 4/19 echo.  15. Fe deficiency anemia: EGD showed gastritis and c-scope showed diverticulosis and polyps in 4/19.  SH: Lives in  [place], 2 kids, nonsmoker, rare ETOH.   FH: No pulmonary HTN.  Father and brother with MIs  ROS: All systems reviewed and negative except as per HPI.   Current Outpatient Medications  Medication Sig Dispense Refill  . amLODipine (NORVASC) 5 MG tablet Take 0.5 tablets (2.5 mg total) by mouth daily. 45 tablet 3  . atorvastatin (LIPITOR) 20 MG tablet Take 20 mg by mouth every morning.    . carvedilol (COREG) 6.25 MG tablet Take 1 tablet (6.25 mg total) by  mouth 2 (two) times daily with a meal. 60 tablet 3  . cetirizine (ZYRTEC) 10 MG tablet Take 10 mg by mouth daily as needed for allergies.    . fenofibrate 160 MG tablet Take 160 mg by mouth every morning.    Marland Kitchen glimepiride (AMARYL) 4 MG tablet Take 4 mg by mouth daily with breakfast.    . levothyroxine (SYNTHROID, LEVOTHROID) 125 MCG tablet Take 125 mcg by mouth daily before breakfast.    . metFORMIN (GLUCOPHAGE-XR) 500 MG 24 hr tablet Take 500 mg by mouth daily with breakfast. Pt may take additional dose if needed    . Multiple Vitamins-Minerals (PRESERVISION AREDS 2 PO) Take 1 capsule by mouth 2 (two) times daily.    . pantoprazole (PROTONIX) 40 MG tablet Take 40 mg by mouth every morning.    . potassium chloride SA (K-DUR,KLOR-CON) 20 MEQ tablet Take 2 tablets (40 mEq total) by mouth daily. 180 tablet 3  . Selexipag (UPTRAVI) 200 MCG TABS Take 1,400 mcg by mouth 2 (two) times daily.    . tadalafil, PAH, (ADCIRCA) 20 MG tablet Take 40 mg by mouth daily.    Marland Kitchen torsemide (DEMADEX) 20 MG tablet Take 4 tablets (80 mg total) by mouth 2 (two) times daily. Take 80 mg (4 tabs) in am and 60 mg (3 tabs) in pm 240 tablet 11   No current facility-administered medications for this encounter.    BP 130/66   Pulse 90   Wt 164 lb (74.4 kg)   SpO2 95%   BMI 26.88 kg/m  General: NAD Neck: JVP 8-9 cm with HJR, no thyromegaly or thyroid nodule.  Lungs: Clear to auscultation bilaterally with normal respiratory effort. CV: Nondisplaced PMI.  Heart regular S1/S2, no S3/S4, no murmur.  1+ edema 1/2 up lower legs bilaterally.  No carotid bruit.  Normal pedal pulses.  Abdomen: Soft, nontender, no hepatosplenomegaly, no distention.  Skin: Intact without lesions or rashes.  Neurologic: Alert and oriented x 3.  Psych: Normal affect. Extremities: No clubbing or cyanosis.  HEENT: Normal.   Assessment/Plan: 1. Pulmonary hypertension:  Severe PAH with PVR 7 WU on 1/19 RHC.  Etiology uncertain.  She has a history of  DVT remotely but V/Q scan in 2/19 did not show evidence for chronic PE. She has OSA and uses CPAP.  Serologic workup showed only positive ANA but when additional serologies were done, they were all negative (see above).  CT chest with no ILD or emphysema.  Valvular heart disease does not appear significant enough to cause this degree of PH.  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  Echo in 4/19 showed that the RV is moderately dilated with mildly decreased systolic function, PA systolic pressure estimation was only 36 mmHg but poor envelope and likely underestimated. She did not tolerate Opsumit due to significant lower extremity edema that resolved off Opsumit.  6 minute walk today is significantly improved and she is feeling better overall.   - Continue Adcirca 40 mg daily.  -  [name] is up to 1400 mcg bid, continue to titrate up.  - With borderline positive RF, I still needs anti-CCP antibody => resend.    - Continue to use O2 with exertion.  2. Syncope: 2 episodes in the last year.  Event monitor after first episode reportedly unremarkable. No recent recurrence.  - Could be related to severe pulmonary hypertension.  3. Mitral stenosis: Noted on echo and cath, mild.   4. H/o DVT: No chronic PE on V/Q scan.   5. Chronic diastolic CHF/RV failure: In setting of PAH and some degree of cor pulmonale.  Still volume overloaded but improving, NYHA class II symptoms now.  - Increase torsemide to 80 mg bid.  BMET today and again in 10 days.     - Wear compression stockings during the day 6. HTN: BP better after cutting back on BP meds.    7. OSA: Continue CPAP.  8. Lung nodule: Non-contrast CT chest in 7/19.   9. CKD: Stage 3.  BMET today.   Doing better, followup 1 month.   Alisha [name] 01/09/2018

## 2018-01-16 ENCOUNTER — Encounter (HOSPITAL_COMMUNITY): Payer: Medicare Other | Admitting: Cardiology

## 2018-01-23 ENCOUNTER — Other Ambulatory Visit (HOSPITAL_COMMUNITY): Payer: Self-pay | Admitting: *Deleted

## 2018-01-23 ENCOUNTER — Other Ambulatory Visit (HOSPITAL_COMMUNITY): Payer: Self-pay

## 2018-01-23 MED ORDER — SELEXIPAG 200 MCG PO TABS: 1400.0000 ug | ORAL_TABLET | Freq: Two times a day (BID) | ORAL | 3 refills | Status: DC

## 2018-01-23 MED ORDER — SELEXIPAG 200 MCG PO TABS: 1600.0000 ug | ORAL_TABLET | Freq: Two times a day (BID) | ORAL | 3 refills | Status: DC

## 2018-01-29 ENCOUNTER — Other Ambulatory Visit (HOSPITAL_COMMUNITY): Payer: Self-pay | Admitting: Cardiology

## 2018-02-17 ENCOUNTER — Other Ambulatory Visit (HOSPITAL_COMMUNITY): Payer: Self-pay | Admitting: Cardiology

## 2018-02-20 ENCOUNTER — Ambulatory Visit (HOSPITAL_COMMUNITY)
Admission: RE | Admit: 2018-02-20 | Discharge: 2018-02-20 | Disposition: A | Discharge status: Home / Self Care | Payer: Medicare Other | Source: Ambulatory Visit | Attending: Cardiology | Admitting: Cardiology

## 2018-02-20 ENCOUNTER — Other Ambulatory Visit: Payer: Self-pay

## 2018-02-20 ENCOUNTER — Encounter (HOSPITAL_COMMUNITY): Payer: Self-pay | Admitting: Cardiology

## 2018-02-20 VITALS — BP 128/67 | HR 74 | Wt 159.5 lb

## 2018-02-20 DIAGNOSIS — I739 Peripheral vascular disease, unspecified: Secondary | ICD-10-CM | POA: Diagnosis not present

## 2018-02-20 DIAGNOSIS — N183 Chronic kidney disease, stage 3 unspecified: Secondary | ICD-10-CM

## 2018-02-20 DIAGNOSIS — E785 Hyperlipidemia, unspecified: Secondary | ICD-10-CM | POA: Insufficient documentation

## 2018-02-20 DIAGNOSIS — D509 Iron deficiency anemia, unspecified: Secondary | ICD-10-CM | POA: Insufficient documentation

## 2018-02-20 DIAGNOSIS — I080 Rheumatic disorders of both mitral and aortic valves: Secondary | ICD-10-CM | POA: Diagnosis not present

## 2018-02-20 DIAGNOSIS — I130 Hypertensive heart and chronic kidney disease with heart failure and stage 1 through stage 4 chronic kidney disease, or unspecified chronic kidney disease: Secondary | ICD-10-CM | POA: Diagnosis not present

## 2018-02-20 DIAGNOSIS — R918 Other nonspecific abnormal finding of lung field: Secondary | ICD-10-CM | POA: Diagnosis not present

## 2018-02-20 DIAGNOSIS — R002 Palpitations: Secondary | ICD-10-CM | POA: Diagnosis not present

## 2018-02-20 DIAGNOSIS — G4733 Obstructive sleep apnea (adult) (pediatric): Secondary | ICD-10-CM | POA: Insufficient documentation

## 2018-02-20 DIAGNOSIS — E039 Hypothyroidism, unspecified: Secondary | ICD-10-CM | POA: Diagnosis not present

## 2018-02-20 DIAGNOSIS — K589 Irritable bowel syndrome without diarrhea: Secondary | ICD-10-CM | POA: Insufficient documentation

## 2018-02-20 DIAGNOSIS — Z79899 Other long term (current) drug therapy: Secondary | ICD-10-CM | POA: Insufficient documentation

## 2018-02-20 DIAGNOSIS — Z7984 Long term (current) use of oral hypoglycemic drugs: Secondary | ICD-10-CM | POA: Diagnosis not present

## 2018-02-20 DIAGNOSIS — R55 Syncope and collapse: Secondary | ICD-10-CM | POA: Diagnosis not present

## 2018-02-20 DIAGNOSIS — I272 Pulmonary hypertension, unspecified: Secondary | ICD-10-CM | POA: Diagnosis not present

## 2018-02-20 DIAGNOSIS — R911 Solitary pulmonary nodule: Secondary | ICD-10-CM | POA: Diagnosis not present

## 2018-02-20 DIAGNOSIS — Z86718 Personal history of other venous thrombosis and embolism: Secondary | ICD-10-CM | POA: Diagnosis not present

## 2018-02-20 DIAGNOSIS — Z79890 Hormone replacement therapy (postmenopausal): Secondary | ICD-10-CM | POA: Insufficient documentation

## 2018-02-20 DIAGNOSIS — I5032 Chronic diastolic (congestive) heart failure: Secondary | ICD-10-CM | POA: Diagnosis not present

## 2018-02-20 DIAGNOSIS — E1122 Type 2 diabetes mellitus with diabetic chronic kidney disease: Secondary | ICD-10-CM | POA: Insufficient documentation

## 2018-02-20 LAB — BASIC METABOLIC PANEL
Anion gap: 11 (ref 5–15)
BUN: 29 mg/dL — ABNORMAL HIGH (ref 6–20)
CALCIUM: 9.4 mg/dL (ref 8.9–10.3)
CO2: 24 mmol/L (ref 22–32)
CREATININE: 1.66 mg/dL — AB (ref 0.44–1.00)
Chloride: 105 mmol/L (ref 101–111)
GFR calc non Af Amer: 29 mL/min — ABNORMAL LOW (ref >60)
GFR, EST AFRICAN AMERICAN: 34 mL/min — AB (ref >60)
Glucose, Bld: 97 mg/dL (ref 65–99)
Potassium: 3 mmol/L — ABNORMAL LOW (ref 3.5–5.1)
Sodium: 140 mmol/L (ref 135–145)

## 2018-02-20 MED ORDER — SPIRONOLACTONE 25 MG PO TABS: 12.5000 mg | ORAL_TABLET | Freq: Every day | ORAL | 3 refills | Status: DC

## 2018-02-20 MED ORDER — FUROSEMIDE 40 MG PO TABS: ORAL_TABLET | ORAL | 2 refills | Status: DC

## 2018-02-20 NOTE — Progress Notes (Signed)
PCP: Dr. Dimas Aguas Cardiology: Dr. Wynonia Lawman HF Cardiology: Dr. Aundra Dubin  77 yo with history of diabetes, HTN, PAD, hyperlipidemia who was referred by Dr. Wynonia Lawman for evaluation of pulmonary hypertension.   For > 1 year, she has had significant exertional dyspnea. It has been steadily worsening, especially over the last few months.  She has been extensively worked up so far.  Echo in 4/18 showed preserved EF 65% with moderate pulmonary hypertension.  She had episodes of syncope in 5/18 and 10/18.  She wore an event monitor in 5/18 with no significant arrhythmia.  LHC/RHC in 1/19 showed normal PCWP and severely elevated PA pressure, no significant CAD.    At initial appointment in 1/19, she was noted to be hypoxemic with exertion and home oxygen was started for use with exertion.  I also started her on Opsumit. She had to stop this after about a week due to significantly increased exertional peripheral edema.  This resolved after Opsumit was stopped.   Echo in 4/19.  This showed EF 60-65%, mild MS, mild AS, moderately dilated RV with mildly decreased systolic function.   She was found to have Fe deficiency anemia, FOBT+.  EGD showed gastritis and c-scope showed diverticulosis and polyps in 4/19.  She get IV Fe.  She is going to have a capsule endoscopy.   She returns for follow up of RV failure and pulm HTN. Last visit torsemide was increased. Weight is down 5 lbs today. Overall doing really well. SOB is much improved. She has to take breaks when grocery shopping. She had presyncope the other day when bending over and cleaning candy off the floor. No dizziness with sitting to standing. She wears her oxygen when she feels her heart racing, which occurs when she is active. She had a rough week last week, but has otherwise been feeling much better. She is still having dark stools. GI work up has been negative. Saw hematology today, but they had nothing to add.   6 minute walk (1/19): 92 m, oxygen saturation  dropped to 70%.   6 minute walk (3/19): 122 m 6 minute walk (4/19): 392 m  Labs (1/19): anti-Jo1 negative, RF borderline positive, SCL-70 negative, HIV negative. ESR 21. BNP 1129.  Labs (3/19): ANA + => anti-dsDNA negative, SSA/SSB negative, anti-Sm negative, anti-RNP negative. K 3.8 => 3.7, creatinine 1.4 => 1.55.  Labs (4/19): K 4.1 => 3.1, creatinine 1.58 => 1.2, BNP 1902, CCP Ab negative.  Labs (6/19): hgb 9  PMH: 1. PAD: Non-healing left ankle ulcer.  - ABIs 9/12 were normal.  2. Type 2 diabetes 3. Hypothyroidism 4. CKD stage 3 5. Hyperlipidemia 6. HTN 7. IBS 8. OSA: Mild, on CPAP.  9. Event monitor 5/18: No significant abnormality.  10. DVT in 1995 11. Pulmonary hypertension: Echo (4/18) with EF 65%, mild MR, mild TR, mild mitral stenosis, moderate pulmonary hypertension. - CTA chest 10/17: No PE.  - RHC (1/19): mean RA 11, PA 87/31 mean 51, mean PCWP 6, CI 3.58, PVR 7 WU.  - PFTs (1/19): mild obstruction, some restriction => severely decreased DLCO. - High resolution CT chest (1/19): no interstitial lung disease or emphysema. 7 mm nodule RUL.  - anti-Jo1 negative, RF borderline positive, SCL-70 negative, HIV negative. ANA + => anti-dsDNA negative, SSA/SSB negative, anti-Sm negative, anti-RNP negative. - V/Q scan 2/19: No e/o chronic PE.  - Echo (4/19): EF 60-65%, mild LVH, mild aortic stenosis, mild mitral stenosis, moderately dilated RV with mildly decreased systolic function, PASP 36 mmHg (  poor envelope, may be underestimated).  12. Coronary angiography 11/18 without significant disease.  13. Mitral stenosis: Mild by cath in 1/19, mean gradient 2.6 mmHg. Mild on 4/19 echo.  14. Aortic stenosis: Mild on 4/19 echo.  15. Fe deficiency anemia: EGD showed gastritis and c-scope showed diverticulosis and polyps in 4/19. Capsule endoscopy was unrevealing.   SH: Lives in [place], 2 kids, nonsmoker, rare ETOH.   FH: No pulmonary HTN.  Father and brother with MIs  Review of  systems complete and found to be negative unless listed in HPI.   Current Outpatient Medications  Medication Sig Dispense Refill  . amLODipine (NORVASC) 5 MG tablet Take 0.5 tablets (2.5 mg total) by mouth daily. 45 tablet 3  . atorvastatin (LIPITOR) 20 MG tablet Take 20 mg by mouth every morning.    . carvedilol (COREG) 6.25 MG tablet TAKE 1 TABLET (6.25 MG TOTAL) BY MOUTH 2 (TWO) TIMES DAILY WITH A MEAL. 60 tablet 2  . cetirizine (ZYRTEC) 10 MG tablet Take 10 mg by mouth daily as needed for allergies.    . fenofibrate 160 MG tablet Take 160 mg by mouth every morning.    Marland Kitchen glimepiride (AMARYL) 4 MG tablet Take 4 mg by mouth daily with breakfast.    . KLOR-CON M10 10 MEQ tablet TAKE 4 TABLETS BY MOUTH DAILY. 120 tablet 1  . levothyroxine (SYNTHROID, LEVOTHROID) 125 MCG tablet Take 125 mcg by mouth daily before breakfast.    . metFORMIN (GLUCOPHAGE-XR) 500 MG 24 hr tablet Take 500 mg by mouth daily with breakfast. Pt may take additional dose if needed    . Multiple Vitamins-Minerals (PRESERVISION AREDS 2 PO) Take 1 capsule by mouth 2 (two) times daily.    . pantoprazole (PROTONIX) 40 MG tablet Take 40 mg by mouth every morning.    . Selexipag (UPTRAVI) 200 MCG TABS Take 8 tablets (1,600 mcg total) by mouth 2 (two) times daily. 420 tablet 3  . tadalafil, PAH, (ADCIRCA) 20 MG tablet Take 40 mg by mouth daily.    Marland Kitchen torsemide (DEMADEX) 20 MG tablet Take 80 mg by mouth 2 (two) times daily. Take 80 mg in am, 40 mg in pm.     No current facility-administered medications for this encounter.    BP 128/67   Pulse 74   Wt 159 lb 8 oz (72.3 kg)   SpO2 100%   BMI 26.14 kg/m   Wt Readings from Last 3 Encounters:  02/20/18 159 lb 8 oz (72.3 kg)  01/08/18 164 lb (74.4 kg)  12/22/17 162 lb (73.5 kg)   General: Well appearing. No resp difficulty. HEENT: Normal Neck: Supple. JVP 8-9. Carotids 2+ bilat; no bruits. No thyromegaly or nodule noted. Cor: PMI nondisplaced. RRR, No M/G/R noted Lungs: CTAB,  normal effort. Abdomen: Soft, non-tender, non-distended, no HSM. No bruits or masses. +BS  Extremities: No cyanosis, clubbing, or rash. R and LLE no edema.  Neuro: Alert & orientedx3, cranial nerves grossly intact. moves all 4 extremities w/o difficulty. Affect pleasant  Assessment/Plan: 1. Pulmonary hypertension:  Severe PAH with PVR 7 WU on 1/19 RHC.  Etiology uncertain.  She has a history of DVT remotely but V/Q scan in 2/19 did not show evidence for chronic PE. She has OSA and uses CPAP.  Serologic workup showed only positive ANA but when additional serologies were done, they were all negative (see above).  CT chest with no ILD or emphysema.  Valvular heart disease does not appear significant enough to cause this  degree of PH.  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  Echo in 4/19 showed that the RV is moderately dilated with mildly decreased systolic function, PA systolic pressure estimation was only 36 mmHg but poor envelope and likely underestimated. She did not tolerate Opsumit due to significant lower extremity edema that resolved off Opsumit.   - Continue Adcirca 40 mg daily.  [name] is up to 1600 mcg BID - Start ambrisentan 5 mg daily  - With borderline positive RF, CCP ab was checked and was negative.  - Continue to use O2 with exertion.  - 6 minute walk next visit 2. Syncope: 2 episodes in the last year.  Event monitor after first episode reportedly unremarkable. She has presyncope if she bends over for a while.  - Could be related to severe pulmonary hypertension.  3. Mitral stenosis: Noted on echo and cath, mild.  No change.  4. H/o DVT: No chronic PE on V/Q scan.  No change.  5. Chronic diastolic CHF/RV failure: In setting of PAH and some degree of cor pulmonale.   - NYHA class II  - Volume status - Increase torsemide 80 mg am, 60 mg pm.  BMET today.  - Start spiro 12.5 mg daily. Continue potassium supp as able.  - Continue to wear compression stockings during the day 6. HTN: Well controlled  today 7. OSA: Continue CPAP qHS.  8. Lung nodule: Non-contrast CT chest in 7/19. No change.  9. CKD: Stage 3.  BMET today.  10. Anemia - Has had full GI work up with no identified cause of bleeding. She saw hematology today, but they had nothing additional to add.  - Has received 2 iron infusions, most recently 6 weeks ago.  11. Palpitations - Check 48 hour Holter  Alisha [name] 02/20/2018  Patient seen with NP, agree with the above note.  Her exercise tolerance has improved with diuresis and on PH medications. Weight is down 5 lbs.  She has been working with GI to try to find the cause of her Fe deficiency anemia, so far studies have been unrevealing.  Last week, she had multiple episodes of palpitations. These have subsided some this week.  No lightheadedness.   On exam, she remains volume overloaded but improved from last appointment.   I will have her increase torsemide to 80 qam/60 qpm. She has a hard time with po KCl.  I will start spironolactone 12.5 daily.   BMET today and again in 10 days.   She is on good doses of Adcirca and Uptravi.  There is evidence for benefit from triple therapy with PH meds in group 1 PH.  She did not tolerate Opsumit.  I will add ambrisentan 5 mg daily. Can increase to 10 mg daily if she tolerates.  She will need 6 minute walk at next appt.   She is due for CT chest in 7/19 to followup lung nodule, will arrange.   Holter x 48 hrs to followup on palpitations.   Continue to work with GI regarding Fe deficiency anemia.  I will check CBC today.   She will see me again in 6 wks.   Loralie Champagne 02/22/2018

## 2018-02-20 NOTE — Patient Instructions (Addendum)
Start Spironolactone 12.5 mg (1/2 tab) daily  Start Lateris  5 mg (1 tab) daily  Increase Torsemide 80 mg (2 tabs) in AM, then 60 mg (1.5 tabs) in PM  Labs drawn today (if we do not call you, then your lab work was stable)   Your physician has recommended that you wear a holter monitor. Holter monitors are medical devices that record the heart's electrical activity. Doctors most often use these monitors to diagnose arrhythmias. Arrhythmias are problems with the speed or rhythm of the heartbeat. The monitor is a small, portable device. You can wear one while you do your normal daily activities. This is usually used to diagnose what is causing palpitations/syncope (passing out).   Your physician recommends that you return for lab work in: 10 days Rx given  Your physician recommends that you schedule a follow-up appointment in: 6-8 weeks with Dr. Aundra Dubin

## 2018-02-21 DIAGNOSIS — D500 Iron deficiency anemia secondary to blood loss (chronic): Secondary | ICD-10-CM | POA: Insufficient documentation

## 2018-02-26 ENCOUNTER — Ambulatory Visit: Payer: Medicare Other

## 2018-02-26 DIAGNOSIS — R55 Syncope and collapse: Secondary | ICD-10-CM | POA: Diagnosis not present

## 2018-02-26 DIAGNOSIS — R002 Palpitations: Secondary | ICD-10-CM

## 2018-03-23 ENCOUNTER — Ambulatory Visit
Admission: RE | Admit: 2018-03-23 | Discharge: 2018-03-23 | Disposition: A | Discharge status: Home / Self Care | Payer: Medicare Other | Source: Ambulatory Visit | Attending: Cardiology | Admitting: Cardiology

## 2018-03-23 DIAGNOSIS — R918 Other nonspecific abnormal finding of lung field: Secondary | ICD-10-CM

## 2018-04-10 ENCOUNTER — Other Ambulatory Visit (HOSPITAL_COMMUNITY): Payer: Self-pay

## 2018-04-10 MED ORDER — TADALAFIL (PAH) 20 MG PO TABS: 40.0000 mg | ORAL_TABLET | Freq: Every day | ORAL | 0 refills | Status: DC

## 2018-04-11 ENCOUNTER — Ambulatory Visit (HOSPITAL_COMMUNITY)
Admission: RE | Admit: 2018-04-11 | Discharge: 2018-04-11 | Disposition: A | Discharge status: Home / Self Care | Payer: Medicare Other | Source: Ambulatory Visit | Attending: Cardiology | Admitting: Cardiology

## 2018-04-11 VITALS — BP 114/72 | HR 75 | Wt 169.2 lb

## 2018-04-11 DIAGNOSIS — R55 Syncope and collapse: Secondary | ICD-10-CM | POA: Diagnosis not present

## 2018-04-11 DIAGNOSIS — I270 Primary pulmonary hypertension: Secondary | ICD-10-CM

## 2018-04-11 DIAGNOSIS — E1122 Type 2 diabetes mellitus with diabetic chronic kidney disease: Secondary | ICD-10-CM | POA: Insufficient documentation

## 2018-04-11 DIAGNOSIS — E039 Hypothyroidism, unspecified: Secondary | ICD-10-CM | POA: Diagnosis not present

## 2018-04-11 DIAGNOSIS — I272 Pulmonary hypertension, unspecified: Secondary | ICD-10-CM | POA: Diagnosis not present

## 2018-04-11 DIAGNOSIS — G4733 Obstructive sleep apnea (adult) (pediatric): Secondary | ICD-10-CM | POA: Insufficient documentation

## 2018-04-11 DIAGNOSIS — R911 Solitary pulmonary nodule: Secondary | ICD-10-CM | POA: Diagnosis not present

## 2018-04-11 DIAGNOSIS — N183 Chronic kidney disease, stage 3 unspecified: Secondary | ICD-10-CM

## 2018-04-11 DIAGNOSIS — E785 Hyperlipidemia, unspecified: Secondary | ICD-10-CM | POA: Diagnosis not present

## 2018-04-11 DIAGNOSIS — D649 Anemia, unspecified: Secondary | ICD-10-CM | POA: Diagnosis not present

## 2018-04-11 DIAGNOSIS — I5032 Chronic diastolic (congestive) heart failure: Secondary | ICD-10-CM | POA: Diagnosis not present

## 2018-04-11 DIAGNOSIS — Z7984 Long term (current) use of oral hypoglycemic drugs: Secondary | ICD-10-CM | POA: Diagnosis not present

## 2018-04-11 DIAGNOSIS — I080 Rheumatic disorders of both mitral and aortic valves: Secondary | ICD-10-CM | POA: Insufficient documentation

## 2018-04-11 DIAGNOSIS — Z79899 Other long term (current) drug therapy: Secondary | ICD-10-CM | POA: Insufficient documentation

## 2018-04-11 DIAGNOSIS — Z86718 Personal history of other venous thrombosis and embolism: Secondary | ICD-10-CM | POA: Diagnosis not present

## 2018-04-11 DIAGNOSIS — I130 Hypertensive heart and chronic kidney disease with heart failure and stage 1 through stage 4 chronic kidney disease, or unspecified chronic kidney disease: Secondary | ICD-10-CM | POA: Diagnosis not present

## 2018-04-11 LAB — CBC
HCT: 32 % — ABNORMAL LOW (ref 36.0–46.0)
HEMOGLOBIN: 9.8 g/dL — AB (ref 12.0–15.0)
MCH: 30.1 pg (ref 26.0–34.0)
MCHC: 30.6 g/dL (ref 30.0–36.0)
MCV: 98.2 fL (ref 78.0–100.0)
PLATELETS: 432 10*3/uL — AB (ref 150–400)
RBC: 3.26 MIL/uL — ABNORMAL LOW (ref 3.87–5.11)
RDW: 17 % — ABNORMAL HIGH (ref 11.5–15.5)
WBC: 7.6 10*3/uL (ref 4.0–10.5)

## 2018-04-11 LAB — BASIC METABOLIC PANEL
ANION GAP: 8 (ref 5–15)
BUN: 23 mg/dL (ref 8–23)
CALCIUM: 7.8 mg/dL — AB (ref 8.9–10.3)
CO2: 23 mmol/L (ref 22–32)
Chloride: 110 mmol/L (ref 98–111)
Creatinine, Ser: 1.49 mg/dL — ABNORMAL HIGH (ref 0.44–1.00)
GFR calc Af Amer: 38 mL/min — ABNORMAL LOW (ref >60)
GFR calc non Af Amer: 33 mL/min — ABNORMAL LOW (ref >60)
Glucose, Bld: 215 mg/dL — ABNORMAL HIGH (ref 70–99)
POTASSIUM: 3.8 mmol/L (ref 3.5–5.1)
Sodium: 141 mmol/L (ref 135–145)

## 2018-04-11 MED ORDER — METOLAZONE 2.5 MG PO TABS: ORAL_TABLET | ORAL | 0 refills | Status: DC

## 2018-04-11 MED ORDER — TORSEMIDE 20 MG PO TABS: 80.0000 mg | ORAL_TABLET | Freq: Two times a day (BID) | ORAL | 3 refills | Status: DC

## 2018-04-11 NOTE — Patient Instructions (Signed)
Labs today (will call for abnormal results, otherwise no news is good news)  INCREASE Torsemide to 80 mg (4 Tablets) Twice Daily  START taking Metolazone 2.5 mg, take one tablet tomorrow and then take once weekly (every Saturday).   Enrollment forms have been started for you to start taking Letairis.  We'll be in contact with you regarding enrollment.    Labs in 10 days (bmet)   Follow up in [facility] in 2 weeks.  Follow up with Dr. Aundra Dubin in 1 month.

## 2018-04-12 ENCOUNTER — Other Ambulatory Visit (HOSPITAL_COMMUNITY): Payer: Self-pay | Admitting: *Deleted

## 2018-04-12 NOTE — Progress Notes (Signed)
PCP: Dr. Dimas Aguas Cardiology: Dr. Wynonia Lawman HF Cardiology: Dr. Aundra Dubin  77 yo with history of diabetes, HTN, PAD, hyperlipidemia who was referred by Dr. Wynonia Lawman for evaluation of pulmonary hypertension.   For > 1 year, she has had significant exertional dyspnea. It has been steadily worsening, especially over the last few months.  She has been extensively worked up so far.  Echo in 4/18 showed preserved EF 65% with moderate pulmonary hypertension.  She had episodes of syncope in 5/18 and 10/18.  She wore an event monitor in 5/18 with no significant arrhythmia.  LHC/RHC in 1/19 showed normal PCWP and severely elevated PA pressure, no significant CAD.    At initial appointment in 1/19, she was noted to be hypoxemic with exertion and home oxygen was started for use with exertion.  I also started her on Opsumit. She had to stop this after about a week due to significantly increased exertional peripheral edema.  This resolved after Opsumit was stopped.   Echo in 4/19.  This showed EF 60-65%, mild MS, mild AS, moderately dilated RV with mildly decreased systolic function.   She was found to have Fe deficiency anemia, FOBT+.  EGD showed gastritis and c-scope showed diverticulosis and polyps in 4/19.  She get IV Fe.  She is going to have a capsule endoscopy.   She returns for follow up of RV failure and pulm HTN. At last appointment, she was doing considerably better.  However, for the last 4-5 days, she has had increased exertional dyspnea.  [place] and found it very hard to breath.  She is still using oxygen rarely, and oxygen saturation is running 94-98%.  She has noted more peripheral edema and weight is up 10 lbs.  She has been taking all her meds.    6 minute walk (1/19): 92 m, oxygen saturation dropped to 70%.   6 minute walk (3/19): 122 m 6 minute walk (4/19): 392 m  Labs (1/19): anti-Jo1 negative, RF borderline positive, SCL-70 negative, HIV negative. ESR 21. BNP  1129.  Labs (3/19): ANA + => anti-dsDNA negative, SSA/SSB negative, anti-Sm negative, anti-RNP negative. K 3.8 => 3.7, creatinine 1.4 => 1.55.  Labs (4/19): K 4.1 => 3.1, creatinine 1.58 => 1.2, BNP 1902, CCP Ab negative.  Labs (6/19): hgb 9, creatinine 1.66, K 3 Labs (7/19): K 4.9, creatinine 1.7  PMH: 1. PAD: Non-healing left ankle ulcer.  - ABIs 9/12 were normal.  2. Type 2 diabetes 3. Hypothyroidism 4. CKD stage 3 5. Hyperlipidemia 6. HTN 7. IBS 8. OSA: Mild, on CPAP.  9. Event monitor 5/18: No significant abnormality.  10. DVT in 1995 11. Pulmonary hypertension: Echo (4/18) with EF 65%, mild MR, mild TR, mild mitral stenosis, moderate pulmonary hypertension. - CTA chest 10/17: No PE.  - RHC (1/19): mean RA 11, PA 87/31 mean 51, mean PCWP 6, CI 3.58, PVR 7 WU.  - PFTs (1/19): mild obstruction, some restriction => severely decreased DLCO. - High resolution CT chest (1/19): no interstitial lung disease or emphysema. 7 mm nodule RUL.  - anti-Jo1 negative, RF borderline positive, SCL-70 negative, HIV negative. ANA + => anti-dsDNA negative, SSA/SSB negative, anti-Sm negative, anti-RNP negative. - V/Q scan 2/19: No e/o chronic PE.  - Echo (4/19): EF 60-65%, mild LVH, mild aortic stenosis, mild mitral stenosis, moderately dilated RV with mildly decreased systolic function, PASP 36 mmHg (poor envelope, may be underestimated).  12. Coronary angiography 11/18 without significant disease.  13.  Mitral stenosis: Mild by cath in 1/19, mean gradient 2.6 mmHg. Mild on 4/19 echo.  14. Aortic stenosis: Mild on 4/19 echo.  15. Fe deficiency anemia: EGD showed gastritis and c-scope showed diverticulosis and polyps in 4/19. Capsule endoscopy was unrevealing.  16. Pulmonary nodule: CT chest 7/19 with RUL nodule.   SH: Lives in [place], 2 kids, nonsmoker, rare ETOH.   FH: No pulmonary HTN.  Father and brother with MIs  Review of systems complete and found to be negative unless listed in HPI.    Current Outpatient Medications  Medication Sig Dispense Refill  . amLODipine (NORVASC) 5 MG tablet Take 0.5 tablets (2.5 mg total) by mouth daily. 45 tablet 3  . atorvastatin (LIPITOR) 20 MG tablet Take 20 mg by mouth every morning.    . carvedilol (COREG) 6.25 MG tablet TAKE 1 TABLET (6.25 MG TOTAL) BY MOUTH 2 (TWO) TIMES DAILY WITH A MEAL. 60 tablet 2  . cetirizine (ZYRTEC) 10 MG tablet Take 10 mg by mouth daily as needed for allergies.    . fenofibrate 160 MG tablet Take 160 mg by mouth every morning.    Marland Kitchen glimepiride (AMARYL) 4 MG tablet Take 4 mg by mouth daily with breakfast.    . KLOR-CON M10 10 MEQ tablet TAKE 4 TABLETS BY MOUTH DAILY. 120 tablet 1  . levothyroxine (SYNTHROID, LEVOTHROID) 125 MCG tablet Take 125 mcg by mouth daily before breakfast.    . metFORMIN (GLUCOPHAGE-XR) 500 MG 24 hr tablet Take 500 mg by mouth daily with breakfast. Pt may take additional dose if needed    . Multiple Vitamins-Minerals (PRESERVISION AREDS 2 PO) Take 1 capsule by mouth 2 (two) times daily.    . pantoprazole (PROTONIX) 40 MG tablet Take 40 mg by mouth every morning.    . Selexipag (UPTRAVI) 200 MCG TABS Take 8 tablets (1,600 mcg total) by mouth 2 (two) times daily. 420 tablet 3  . spironolactone (ALDACTONE) 25 MG tablet Take 0.5 tablets (12.5 mg total) by mouth daily. 15 tablet 3  . tadalafil, PAH, (ADCIRCA) 20 MG tablet Take 2 tablets (40 mg total) by mouth daily. 180 tablet 0  . torsemide (DEMADEX) 20 MG tablet Take 4 tablets (80 mg total) by mouth 2 (two) times daily. 720 tablet 3  . metolazone (ZAROXOLYN) 2.5 MG tablet Take 1 Tablet on 8/1 then take once weekly on Saturdays. 5 tablet 0   No current facility-administered medications for this encounter.    BP 114/72   Pulse 75   Wt 169 lb 3.2 oz (76.7 kg)   SpO2 94%   BMI 27.73 kg/m   Wt Readings from Last 3 Encounters:  04/11/18 169 lb 3.2 oz (76.7 kg)  02/20/18 159 lb 8 oz (72.3 kg)  01/08/18 164 lb (74.4 kg)   General: NAD Neck:  JVP 12-14 cm, no thyromegaly or thyroid nodule.  Lungs: Clear to auscultation bilaterally with normal respiratory effort. CV: Nondisplaced PMI.  Heart regular S1/S2, no S3/S4, 2/6 SEM RUSB with clear S2.  2+ edema to knees bilaterally.  No carotid bruit.  Normal pedal pulses.  Abdomen: Soft, nontender, no hepatosplenomegaly, no distention.  Skin: Intact without lesions or rashes.  Neurologic: Alert and oriented x 3.  Psych: Normal affect. Extremities: No clubbing or cyanosis.  HEENT: Normal.   Assessment/Plan: 1. Pulmonary hypertension:  Severe PAH with PVR 7 WU on 1/19 RHC.  Etiology uncertain.  She has a history of DVT remotely but V/Q scan in 2/19 did not show evidence  for chronic PE. She has OSA and uses CPAP.  Serologic workup showed only positive ANA but when additional serologies were done, they were all negative (see above).  CT chest with no ILD or emphysema.  Valvular heart disease does not appear significant enough to cause this degree of PH.  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  Echo in 4/19 showed that the RV is moderately dilated with mildly decreased systolic function, PA systolic pressure estimation was only 36 mmHg but poor envelope and likely underestimated. She did not tolerate Opsumit due to significant lower extremity edema that resolved off Opsumit.   - Continue Adcirca 40 mg daily.  [name] is up to 1600 mcg BID - Still needs to start ambrisentan 5 mg daily, will work on arranging.  - With borderline positive RF, CCP ab was checked and was negative.  - Will defer 6 minute walk until she is better diuresed. 2. Syncope: 2 episodes in the last year.  Event monitor after first episode reportedly unremarkable. She has presyncope if she bends over for a while.  - Could be related to severe pulmonary hypertension.  3. Mitral stenosis: Noted on echo and cath, mild.  No change.  4. H/o DVT: No chronic PE on V/Q scan.   5. Chronic diastolic CHF/RV failure: In setting of PAH and some degree of cor  pulmonale.  NYHA class III symptoms, worsened for 4-5 days.  She is volume overloaded with significant peripheral edema.  She looks significantly worse than at last appointment.  - Increase torsemide to 80 mg bid.  - Metolazone 2.5 x 1 on Thursday and again on Saturday. After that, she will take metolazone once weekly on Saturday.  She will take an extra KCl 40 on metolazone days.  BMET today and again in 10 days.  - Continue spiro 12.5 mg daily. Continue KCl 40 daily.   - Continue to wear compression stockings during the day 6. HTN: Well controlled today 7. OSA: Continue CPAP qHS.  8. Lung nodule: Needs repeat noncontrast CT in 1/20 (6 months).  9. CKD: Stage 3.  BMET today.  10. Anemia: CBC today.    Followup in 2 wks with NP/PA and in 1 month with me.   Loralie Champagne 04/12/2018

## 2018-04-21 ENCOUNTER — Encounter (HOSPITAL_COMMUNITY): Payer: Self-pay

## 2018-04-25 ENCOUNTER — Ambulatory Visit (HOSPITAL_COMMUNITY)
Admission: RE | Admit: 2018-04-25 | Discharge: 2018-04-25 | Disposition: A | Discharge status: Home / Self Care | Payer: Medicare Other | Source: Ambulatory Visit | Attending: Internal Medicine | Admitting: Internal Medicine

## 2018-04-25 ENCOUNTER — Encounter (HOSPITAL_COMMUNITY): Payer: Self-pay

## 2018-04-25 VITALS — BP 118/52 | HR 74 | Wt 158.4 lb

## 2018-04-25 DIAGNOSIS — I080 Rheumatic disorders of both mitral and aortic valves: Secondary | ICD-10-CM | POA: Insufficient documentation

## 2018-04-25 DIAGNOSIS — I130 Hypertensive heart and chronic kidney disease with heart failure and stage 1 through stage 4 chronic kidney disease, or unspecified chronic kidney disease: Secondary | ICD-10-CM | POA: Insufficient documentation

## 2018-04-25 DIAGNOSIS — G4733 Obstructive sleep apnea (adult) (pediatric): Secondary | ICD-10-CM | POA: Diagnosis not present

## 2018-04-25 DIAGNOSIS — N183 Chronic kidney disease, stage 3 unspecified: Secondary | ICD-10-CM

## 2018-04-25 DIAGNOSIS — E039 Hypothyroidism, unspecified: Secondary | ICD-10-CM | POA: Insufficient documentation

## 2018-04-25 DIAGNOSIS — I5032 Chronic diastolic (congestive) heart failure: Secondary | ICD-10-CM | POA: Diagnosis not present

## 2018-04-25 DIAGNOSIS — E1122 Type 2 diabetes mellitus with diabetic chronic kidney disease: Secondary | ICD-10-CM | POA: Diagnosis not present

## 2018-04-25 DIAGNOSIS — K589 Irritable bowel syndrome without diarrhea: Secondary | ICD-10-CM | POA: Diagnosis not present

## 2018-04-25 DIAGNOSIS — Z86718 Personal history of other venous thrombosis and embolism: Secondary | ICD-10-CM | POA: Insufficient documentation

## 2018-04-25 DIAGNOSIS — Z9989 Dependence on other enabling machines and devices: Secondary | ICD-10-CM

## 2018-04-25 DIAGNOSIS — E785 Hyperlipidemia, unspecified: Secondary | ICD-10-CM | POA: Diagnosis not present

## 2018-04-25 DIAGNOSIS — I270 Primary pulmonary hypertension: Secondary | ICD-10-CM | POA: Diagnosis not present

## 2018-04-25 DIAGNOSIS — Z7984 Long term (current) use of oral hypoglycemic drugs: Secondary | ICD-10-CM | POA: Diagnosis not present

## 2018-04-25 DIAGNOSIS — M109 Gout, unspecified: Secondary | ICD-10-CM | POA: Diagnosis not present

## 2018-04-25 DIAGNOSIS — Z79899 Other long term (current) drug therapy: Secondary | ICD-10-CM | POA: Diagnosis not present

## 2018-04-25 DIAGNOSIS — R55 Syncope and collapse: Secondary | ICD-10-CM | POA: Diagnosis not present

## 2018-04-25 DIAGNOSIS — D649 Anemia, unspecified: Secondary | ICD-10-CM

## 2018-04-25 DIAGNOSIS — R911 Solitary pulmonary nodule: Secondary | ICD-10-CM | POA: Diagnosis not present

## 2018-04-25 DIAGNOSIS — I272 Pulmonary hypertension, unspecified: Secondary | ICD-10-CM | POA: Diagnosis not present

## 2018-04-25 LAB — BASIC METABOLIC PANEL WITH GFR
Anion gap: 12 (ref 5–15)
BUN: 37 mg/dL — ABNORMAL HIGH (ref 8–23)
CO2: 26 mmol/L (ref 22–32)
Calcium: 8.6 mg/dL — ABNORMAL LOW (ref 8.9–10.3)
Chloride: 98 mmol/L (ref 98–111)
Creatinine, Ser: 1.59 mg/dL — ABNORMAL HIGH (ref 0.44–1.00)
GFR calc Af Amer: 36 mL/min — ABNORMAL LOW (ref >60)
GFR calc non Af Amer: 31 mL/min — ABNORMAL LOW (ref >60)
Glucose, Bld: 168 mg/dL — ABNORMAL HIGH (ref 70–99)
Potassium: 3 mmol/L — ABNORMAL LOW (ref 3.5–5.1)
Sodium: 136 mmol/L (ref 135–145)

## 2018-04-25 LAB — CBC
HEMATOCRIT: 34.4 % — AB (ref 36.0–46.0)
HEMOGLOBIN: 10.7 g/dL — AB (ref 12.0–15.0)
MCH: 29.6 pg (ref 26.0–34.0)
MCHC: 31.1 g/dL (ref 30.0–36.0)
MCV: 95.3 fL (ref 78.0–100.0)
PLATELETS: 364 10*3/uL (ref 150–400)
RBC: 3.61 MIL/uL — AB (ref 3.87–5.11)
RDW: 15.3 % (ref 11.5–15.5)
WBC: 5.6 10*3/uL (ref 4.0–10.5)

## 2018-04-25 NOTE — Progress Notes (Signed)
Advanced Heart Failure Clinic Note  PCP: Dr. Dimas Aguas Cardiology: Dr. Wynonia Lawman HF Cardiology: Dr. Hilton Sinclair is a 77 y.o. female with history of diabetes, HTN, PAD, hyperlipidemia who was referred by Dr. Wynonia Lawman for evaluation of pulmonary hypertension.   For > 1 year, she has had significant exertional dyspnea. It has been steadily worsening, especially over the last few months.  She has been extensively worked up so far.  Echo in 4/18 showed preserved EF 65% with moderate pulmonary hypertension.  She had episodes of syncope in 5/18 and 10/18.  She wore an event monitor in 5/18 with no significant arrhythmia.  LHC/RHC in 1/19 showed normal PCWP and severely elevated PA pressure, no significant CAD.    At initial appointment in 1/19, she was noted to be hypoxemic with exertion and home oxygen was started for use with exertion.  I also started her on Opsumit. She had to stop this after about a week due to significantly increased exertional peripheral edema.  This resolved after Opsumit was stopped.   Echo in 4/19.  This showed EF 60-65%, mild MS, mild AS, moderately dilated RV with mildly decreased systolic function.   She was found to have Fe deficiency anemia, FOBT+.  EGD showed gastritis and c-scope showed diverticulosis and polyps in 4/19.  She get IV Fe.  She is going to have a capsule endoscopy.   She returns today for follow up of RV failure and pulmonary HTN. Last visit, torsemide was increased and she was started on weekly metolazone. She has actually been increasing her lasix instead of torsemide recently. She was on torsemide at one point and does not remember diuresing better with it. Cannot recall exactly when this was. Weight has gone down with lasix 80 mg BID + metolazone once weekly. She is feeling a lot better. Able to walk her dog 1 block and back without SOB. She did go to the mountains 2 weeks ago and was more SOB with the high altitude. She had to use her O2. She also  had an episode of presyncope. Her symptoms resolved coming down the mountain. Denies orthopnea. BLE edema is much improved. No CP or further dizziness. She has a gout flare in her leg big toe, but it has improved with Ibuprofen. Limiting fluid and salt intake. Taking all medications. Weights 162 > 152 lbs at home.   6 minute walk (1/19): 92 m, oxygen saturation dropped to 70%.   6 minute walk (3/19): 122 m 6 minute walk (4/19): 392 m  Labs (1/19): anti-Jo1 negative, RF borderline positive, SCL-70 negative, HIV negative. ESR 21. BNP 1129.  Labs (3/19): ANA + => anti-dsDNA negative, SSA/SSB negative, anti-Sm negative, anti-RNP negative. K 3.8 => 3.7, creatinine 1.4 => 1.55.  Labs (4/19): K 4.1 => 3.1, creatinine 1.58 => 1.2, BNP 1902, CCP Ab negative.  Labs (6/19): hgb 9, creatinine 1.66, K 3 Labs (7/19): K 4.9, creatinine 1.7  PMH: 1. PAD: Non-healing left ankle ulcer.  - ABIs 9/12 were normal.  2. Type 2 diabetes 3. Hypothyroidism 4. CKD stage 3 5. Hyperlipidemia 6. HTN 7. IBS 8. OSA: Mild, on CPAP.  9. Event monitor 5/18: No significant abnormality.  10. DVT in 1995 11. Pulmonary hypertension: Echo (4/18) with EF 65%, mild MR, mild TR, mild mitral stenosis, moderate pulmonary hypertension. - CTA chest 10/17: No PE.  - RHC (1/19): mean RA 11, PA 87/31 mean 51, mean PCWP 6, CI 3.58, PVR 7 WU.  - PFTs (1/19): mild  obstruction, some restriction => severely decreased DLCO. - High resolution CT chest (1/19): no interstitial lung disease or emphysema. 7 mm nodule RUL.  - anti-Jo1 negative, RF borderline positive, SCL-70 negative, HIV negative. ANA + => anti-dsDNA negative, SSA/SSB negative, anti-Sm negative, anti-RNP negative. - V/Q scan 2/19: No e/o chronic PE.  - Echo (4/19): EF 60-65%, mild LVH, mild aortic stenosis, mild mitral stenosis, moderately dilated RV with mildly decreased systolic function, PASP 36 mmHg (poor envelope, may be underestimated).  12. Coronary angiography 11/18  without significant disease.  13. Mitral stenosis: Mild by cath in 1/19, mean gradient 2.6 mmHg. Mild on 4/19 echo.  14. Aortic stenosis: Mild on 4/19 echo.  15. Fe deficiency anemia: EGD showed gastritis and c-scope showed diverticulosis and polyps in 4/19. Capsule endoscopy was unrevealing.  16. Pulmonary nodule: CT chest 7/19 with RUL nodule.   SH: Lives in [place], 2 kids, nonsmoker, rare ETOH.   FH: No pulmonary HTN.  Father and brother with MIs  Review of systems complete and found to be negative unless listed in HPI.   Current Outpatient Medications  Medication Sig Dispense Refill  . amLODipine (NORVASC) 5 MG tablet Take 0.5 tablets (2.5 mg total) by mouth daily. 45 tablet 3  . atorvastatin (LIPITOR) 20 MG tablet Take 20 mg by mouth every morning.    . carvedilol (COREG) 6.25 MG tablet TAKE 1 TABLET (6.25 MG TOTAL) BY MOUTH 2 (TWO) TIMES DAILY WITH A MEAL. 60 tablet 2  . cetirizine (ZYRTEC) 10 MG tablet Take 10 mg by mouth daily as needed for allergies.    . fenofibrate 160 MG tablet Take 160 mg by mouth every morning.    . ferrous sulfate 325 (65 FE) MG tablet Take 325 mg by mouth daily with breakfast.    . furosemide (LASIX) 40 MG tablet Take 80 mg by mouth 2 (two) times daily.    Marland Kitchen glimepiride (AMARYL) 4 MG tablet Take 4 mg by mouth daily with breakfast.    . KLOR-CON M10 10 MEQ tablet TAKE 4 TABLETS BY MOUTH DAILY. 120 tablet 1  . levothyroxine (SYNTHROID, LEVOTHROID) 125 MCG tablet Take 125 mcg by mouth daily before breakfast.    . loperamide (IMODIUM A-D) 2 MG tablet Take 2 mg by mouth 4 (four) times daily as needed for diarrhea or loose stools.    Marland Kitchen loratadine (CLARITIN) 10 MG tablet Take 10 mg by mouth daily.    . metFORMIN (GLUCOPHAGE-XR) 500 MG 24 hr tablet Take 500 mg by mouth daily with breakfast. Pt may take additional dose if needed    . metolazone (ZAROXOLYN) 2.5 MG tablet Take 1 Tablet on 8/1 then take once weekly on Saturdays. 5 tablet 0  . Multiple  Vitamins-Minerals (PRESERVISION AREDS 2 PO) Take 1 capsule by mouth 2 (two) times daily.    . pantoprazole (PROTONIX) 40 MG tablet Take 40 mg by mouth every morning.    . Selexipag (UPTRAVI) 200 MCG TABS Take 8 tablets (1,600 mcg total) by mouth 2 (two) times daily. 420 tablet 3  . spironolactone (ALDACTONE) 25 MG tablet Take 0.5 tablets (12.5 mg total) by mouth daily. 15 tablet 3  . tadalafil, PAH, (ADCIRCA) 20 MG tablet Take 2 tablets (40 mg total) by mouth daily. 180 tablet 0   No current facility-administered medications for this encounter.    BP (!) 118/52   Pulse 74   Wt 71.8 kg (158 lb 6.4 oz)   SpO2 (!) 88%   BMI 25.96 kg/m  Wt Readings from Last 3 Encounters:  04/25/18 71.8 kg (158 lb 6.4 oz)  04/11/18 76.7 kg (169 lb 3.2 oz)  02/20/18 72.3 kg (159 lb 8 oz)   General:No resp difficulty. HEENT: Normal Neck: Supple. JVP 7. Carotids 2+ bilat; no bruits. No thyromegaly or nodule noted. Cor: PMI nondisplaced. RRR, 2/6 SEM RUSB with clear S2 Lungs: diminished in bases.  Abdomen: Soft, non-tender, non-distended, no HSM. No bruits or masses. +BS  Extremities: No cyanosis, clubbing, or rash. R and LLE 1+ ankle edema. Right foot mildly swollen, warm Neuro: Alert & orientedx3, cranial nerves grossly intact. moves all 4 extremities w/o difficulty. Affect pleasant   Assessment/Plan: 1. Pulmonary hypertension:  Severe PAH with PVR 7 WU on 1/19 RHC.  Etiology uncertain.  She has a history of DVT remotely but V/Q scan in 2/19 did not show evidence for chronic PE. She has OSA and uses CPAP.  Serologic workup showed only positive ANA but when additional serologies were done, they were all negative (see above).  CT chest with no ILD or emphysema.  Valvular heart disease does not appear significant enough to cause this degree of PH.  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  Echo in 4/19 showed that the RV is moderately dilated with mildly decreased systolic function, PA systolic pressure estimation was only 36  mmHg but poor envelope and likely underestimated. She did not tolerate Opsumit due to significant lower extremity edema that resolved off Opsumit.   - Continue Adcirca 40 mg daily.  Alisha [name] is up to 1600 mcg BID - Still needs to start ambrisentan 5 mg daily. Alisha [name], PharmD is working on this.  - With borderline positive RF, CCP ab was checked and was negative.  - Will defer 6 minute walk today with left toe gout flare 2. Syncope: 2 episodes in the last year.  Event monitor after first episode reportedly unremarkable. She has presyncope if she bends over for a while.  - Could be related to severe pulmonary hypertension.  3. Mitral stenosis: Noted on echo and cath, mild.  No change. 4. H/o DVT: No chronic PE on V/Q scan.  No change.  5. Chronic diastolic CHF/RV failure: In setting of PAH and some degree of cor pulmonale.  NYHA class III symptoms. Volume status much improved.  - Continue lasix 80 mg bid.  - Continue metolazone 2.5 mg once weekly on Saturday with additional 40 meq KCl - Continue spiro 12.5 mg daily. Continue KCl 40 daily.   - Provided new prescription for TED hose.  6. HTN: Stable today.  7. OSA: Continue CPAP qHS. No change.  8. Lung nodule: Needs repeat noncontrast CT in 1/20 (6 months). No change.  9. CKD: Stage 3. BMET today.  10. Anemia: Hemoglobin stable 9.8 on 04/11/18. 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 She does not feel that she needs prednisone  Defer 6MW today with gout flare BMET, CBC  Keep follow up with Dr Aundra Dubin in 4 weeks.   Georgiana Shore, NP 04/25/2018  Greater than 50% of the 25 minute visit was spent in counseling/coordination of care regarding disease state education, salt/fluid restriction, sliding scale diuretics, and medication compliance.

## 2018-04-25 NOTE — Patient Instructions (Signed)
Routine lab work today. Will notify you of abnormal results, otherwise no news is good news!  Wear compression stockings daily. Remove for sleeping and bathing. Available at any medical supply store (take your prescription paper with you). [employer] (closes medical store from here): Address: [address] [place], [place] [address]  Phone: [phone]  Follow up as scheduled with Dr. Aundra Dubin. See next sheet for appointment details.  Take all medication as prescribed the day of your appointment. Bring all medications with you to your appointment.  Do the following things EVERYDAY: 1) Weigh yourself in the morning before breakfast. Write it down and keep it in a log. 2) Take your medicines as prescribed 3) Eat low salt foods-Limit salt (sodium) to 2000 mg per day.  4) Stay as active as you can everyday 5) Limit all fluids for the day to less than 2 liters

## 2018-04-27 ENCOUNTER — Telehealth (HOSPITAL_COMMUNITY): Payer: Self-pay | Admitting: Cardiology

## 2018-04-27 DIAGNOSIS — I5032 Chronic diastolic (congestive) heart failure: Secondary | ICD-10-CM

## 2018-04-27 NOTE — Telephone Encounter (Signed)
Notes recorded by Kerry Dory, CMA on 04/27/2018 at 3:17 PM EDT Patient aware. Patient reports she has not been consistent in taking potassium daily, advised on the importance of taking daily with additional on metolazone days. Reports she will return for labs x 1 week (walk in)  ------  Notes recorded by Georgiana Shore, NP on 04/26/2018 at 8:27 AM EDT Creatinine and hemoglobin stable. Potassium is low. Please call and make sure she is taking 40 meq potassium every day with additional 40 meq on days with metolazone. If she is taking correctly, have her take an extra 40 meq today and then her increase her daily potassium to 30 meq BID, still with extra 40 on metolazone days. Needs a repeat BMET in 1 week. She lives in [place], so she can have it drawn somewhere there if that's more convenient.

## 2018-05-03 ENCOUNTER — Other Ambulatory Visit (HOSPITAL_COMMUNITY): Payer: Self-pay | Admitting: Cardiology

## 2018-05-04 ENCOUNTER — Other Ambulatory Visit (HOSPITAL_COMMUNITY): Payer: Self-pay | Admitting: Pharmacist

## 2018-05-04 ENCOUNTER — Ambulatory Visit (HOSPITAL_COMMUNITY)
Admission: RE | Admit: 2018-05-04 | Discharge: 2018-05-04 | Disposition: A | Discharge status: Home / Self Care | Payer: Medicare Other | Source: Ambulatory Visit | Attending: Internal Medicine | Admitting: Internal Medicine

## 2018-05-04 ENCOUNTER — Telehealth (HOSPITAL_COMMUNITY): Payer: Self-pay | Admitting: Pharmacist

## 2018-05-04 DIAGNOSIS — I5032 Chronic diastolic (congestive) heart failure: Secondary | ICD-10-CM | POA: Insufficient documentation

## 2018-05-04 LAB — BASIC METABOLIC PANEL
ANION GAP: 12 (ref 5–15)
BUN: 21 mg/dL (ref 8–23)
CO2: 23 mmol/L (ref 22–32)
CREATININE: 1.36 mg/dL — AB (ref 0.44–1.00)
Calcium: 8.7 mg/dL — ABNORMAL LOW (ref 8.9–10.3)
Chloride: 106 mmol/L (ref 98–111)
GFR, EST AFRICAN AMERICAN: 43 mL/min — AB (ref >60)
GFR, EST NON AFRICAN AMERICAN: 37 mL/min — AB (ref >60)
GLUCOSE: 213 mg/dL — AB (ref 70–99)
Potassium: 3.6 mmol/L (ref 3.5–5.1)
Sodium: 141 mmol/L (ref 135–145)

## 2018-05-04 MED ORDER — AMBRISENTAN 5 MG PO TABS: 5.0000 mg | ORAL_TABLET | Freq: Every day | ORAL | 5 refills | Status: DC

## 2018-05-04 NOTE — Telephone Encounter (Signed)
Ambrisentan 5 mg PA approved by [facility] through 09/11/18.   Ruta Hinds. Velva Harman, PharmD, BCPS, CPP Clinical Pharmacist Phone: [phone] 05/04/2018 1:26 PM

## 2018-05-11 ENCOUNTER — Telehealth (HOSPITAL_COMMUNITY): Payer: Self-pay

## 2018-05-11 NOTE — Telephone Encounter (Signed)
Pt called to report that she started a new medication (Ambrisentan) on Wed 08/28 and on Thurs 08/29 and Frid 08/30, pt was feeling SOB and her oxygen levels dropped to 75% to 80%. Pt states that she has not had to use oxygen in 2-3 months until Thurs.  Per Dr. Aundra Dubin pt is to stop taking Ambrisentan).

## 2018-05-21 ENCOUNTER — Encounter (HOSPITAL_COMMUNITY): Payer: Self-pay | Admitting: Cardiology

## 2018-05-21 ENCOUNTER — Other Ambulatory Visit: Payer: Self-pay

## 2018-05-21 ENCOUNTER — Ambulatory Visit (HOSPITAL_COMMUNITY)
Admission: RE | Admit: 2018-05-21 | Discharge: 2018-05-21 | Disposition: A | Discharge status: Home / Self Care | Payer: Medicare Other | Source: Ambulatory Visit | Attending: Cardiology | Admitting: Cardiology

## 2018-05-21 VITALS — BP 120/50 | HR 67 | Wt 163.5 lb

## 2018-05-21 DIAGNOSIS — G4733 Obstructive sleep apnea (adult) (pediatric): Secondary | ICD-10-CM | POA: Insufficient documentation

## 2018-05-21 DIAGNOSIS — I130 Hypertensive heart and chronic kidney disease with heart failure and stage 1 through stage 4 chronic kidney disease, or unspecified chronic kidney disease: Secondary | ICD-10-CM | POA: Diagnosis present

## 2018-05-21 DIAGNOSIS — E785 Hyperlipidemia, unspecified: Secondary | ICD-10-CM | POA: Insufficient documentation

## 2018-05-21 DIAGNOSIS — I5189 Other ill-defined heart diseases: Secondary | ICD-10-CM | POA: Diagnosis not present

## 2018-05-21 DIAGNOSIS — N183 Chronic kidney disease, stage 3 unspecified: Secondary | ICD-10-CM

## 2018-05-21 DIAGNOSIS — E1151 Type 2 diabetes mellitus with diabetic peripheral angiopathy without gangrene: Secondary | ICD-10-CM | POA: Diagnosis not present

## 2018-05-21 DIAGNOSIS — I272 Pulmonary hypertension, unspecified: Secondary | ICD-10-CM | POA: Diagnosis not present

## 2018-05-21 DIAGNOSIS — K589 Irritable bowel syndrome without diarrhea: Secondary | ICD-10-CM | POA: Diagnosis not present

## 2018-05-21 DIAGNOSIS — I5032 Chronic diastolic (congestive) heart failure: Secondary | ICD-10-CM | POA: Insufficient documentation

## 2018-05-21 DIAGNOSIS — R55 Syncope and collapse: Secondary | ICD-10-CM | POA: Insufficient documentation

## 2018-05-21 DIAGNOSIS — E039 Hypothyroidism, unspecified: Secondary | ICD-10-CM | POA: Insufficient documentation

## 2018-05-21 DIAGNOSIS — Z86718 Personal history of other venous thrombosis and embolism: Secondary | ICD-10-CM | POA: Diagnosis not present

## 2018-05-21 DIAGNOSIS — Z7984 Long term (current) use of oral hypoglycemic drugs: Secondary | ICD-10-CM | POA: Diagnosis not present

## 2018-05-21 DIAGNOSIS — R911 Solitary pulmonary nodule: Secondary | ICD-10-CM | POA: Diagnosis not present

## 2018-05-21 DIAGNOSIS — I080 Rheumatic disorders of both mitral and aortic valves: Secondary | ICD-10-CM | POA: Diagnosis not present

## 2018-05-21 DIAGNOSIS — D649 Anemia, unspecified: Secondary | ICD-10-CM | POA: Diagnosis not present

## 2018-05-21 DIAGNOSIS — Z79899 Other long term (current) drug therapy: Secondary | ICD-10-CM | POA: Diagnosis not present

## 2018-05-21 DIAGNOSIS — E1122 Type 2 diabetes mellitus with diabetic chronic kidney disease: Secondary | ICD-10-CM | POA: Insufficient documentation

## 2018-05-21 LAB — BASIC METABOLIC PANEL
Anion gap: 14 (ref 5–15)
BUN: 28 mg/dL — AB (ref 8–23)
CHLORIDE: 99 mmol/L (ref 98–111)
CO2: 25 mmol/L (ref 22–32)
CREATININE: 1.68 mg/dL — AB (ref 0.44–1.00)
Calcium: 8.8 mg/dL — ABNORMAL LOW (ref 8.9–10.3)
GFR calc Af Amer: 33 mL/min — ABNORMAL LOW (ref >60)
GFR calc non Af Amer: 29 mL/min — ABNORMAL LOW (ref >60)
Glucose, Bld: 237 mg/dL — ABNORMAL HIGH (ref 70–99)
POTASSIUM: 3.4 mmol/L — AB (ref 3.5–5.1)
SODIUM: 138 mmol/L (ref 135–145)

## 2018-05-21 MED ORDER — METOLAZONE 2.5 MG PO TABS: 2.5000 mg | ORAL_TABLET | ORAL | 6 refills | Status: DC

## 2018-05-21 MED ORDER — POTASSIUM CHLORIDE CRYS ER 10 MEQ PO TBCR: EXTENDED_RELEASE_TABLET | ORAL | 3 refills | Status: DC

## 2018-05-21 MED ORDER — FUROSEMIDE 40 MG PO TABS: ORAL_TABLET | ORAL | 3 refills | Status: DC

## 2018-05-21 NOTE — Patient Instructions (Signed)
Increase Furosemide to 120 mg (3 tabs) in AM and 80 mg (2 tabs) in PM  Increase Potassium to 40 meq (2 tabs) in AM and 20 meq (1 tab) in PM, except on Saturdays when you take Metolazone take 40 meq (2 tabs) Twice daily   Take Metolazone tomorrow, then take it every Saturday  Labs today  Labs in 1 week  Your physician recommends that you schedule a follow-up appointment in: 3 weeks with APP  Your physician recommends that you schedule a follow-up appointment in: 6 weeks with Dr Aundra Dubin

## 2018-05-21 NOTE — Progress Notes (Signed)
6 min walk test completed.  Pt ambulated 1040 ft (317 m), O2 sat ranged 96%-90% on RA, HR ranged 66-84.

## 2018-05-21 NOTE — Progress Notes (Signed)
PCP: Dr. Dimas Aguas Cardiology: Dr. Wynonia Lawman HF Cardiology: Dr. Aundra Dubin  77 yo with history of diabetes, HTN, PAD, hyperlipidemia who was referred by Dr. Wynonia Lawman for evaluation of pulmonary hypertension.   For > 1 year, she has had significant exertional dyspnea. It has been steadily worsening, especially over the last few months.  She has been extensively worked up so far.  Echo in 4/18 showed preserved EF 65% with moderate pulmonary hypertension.  She had episodes of syncope in 5/18 and 10/18.  She wore an event monitor in 5/18 with no significant arrhythmia.  LHC/RHC in 1/19 showed normal PCWP and severely elevated PA pressure, no significant CAD.    At initial appointment in 1/19, she was noted to be hypoxemic with exertion and home oxygen was started for use with exertion.  I also started her on Opsumit. She had to stop this after about a week due to significantly increased exertional peripheral edema.  This resolved after Opsumit was stopped.   Echo in 4/19.  This showed EF 60-65%, mild MS, mild AS, moderately dilated RV with mildly decreased systolic function.   She was found to have Fe deficiency anemia, FOBT+.  EGD showed gastritis and c-scope showed diverticulosis and polyps in 4/19.  She get IV Fe.  She is going to have a capsule endoscopy.   She tried to start ambrisentan, but developed worsening dyspnea and peripheral edema and low oxygen saturation.  This improved after ambrisentan was stopped.   She returns for follow up of RV failure and pulm HTN.  She is improving off ambrisentan, but still not doing as well as she was before she started ambrisentan.  6 minute walk distance was a bit less today than it was in 4/19.  She is on Lasix now, which she feels is more effective than torsemide.  She is taking metolazone once a week.  Weight is up about 5 lbs.  She is short of breath walking fast or walking up stairs. She is short of breath after walking about 1 block.  No orthopnea/PND.  No chest  pain.  No lightheadedness/syncope. Able to do all ADLs without problems.   6 minute walk (1/19): 92 m, oxygen saturation dropped to 70%.   6 minute walk (3/19): 122 m 6 minute walk (4/19): 392 m 6 minute walk (9/19): [place] (1/19): anti-Jo1 negative, RF borderline positive, SCL-70 negative, HIV negative. ESR 21. BNP 1129.  Labs (3/19): ANA + => anti-dsDNA negative, SSA/SSB negative, anti-Sm negative, anti-RNP negative. K 3.8 => 3.7, creatinine 1.4 => 1.55.  Labs (4/19): K 4.1 => 3.1, creatinine 1.58 => 1.2, BNP 1902, CCP Ab negative.  Labs (6/19): hgb 9, creatinine 1.66, K 3 Labs (7/19): K 4.9, creatinine 1.7 Labs (8/19): K 3.6, creatinine 1.36  PMH: 1. PAD: Non-healing left ankle ulcer.  - ABIs 9/12 were normal.  2. Type 2 diabetes 3. Hypothyroidism 4. CKD stage 3 5. Hyperlipidemia 6. HTN 7. IBS 8. OSA: Mild, on CPAP.  9. Event monitor 5/18: No significant abnormality.  10. DVT in 1995 11. Pulmonary hypertension: Echo (4/18) with EF 65%, mild MR, mild TR, mild mitral stenosis, moderate pulmonary hypertension. - CTA chest 10/17: No PE.  - RHC (1/19): mean RA 11, PA 87/31 mean 51, mean PCWP 6, CI 3.58, PVR 7 WU.  - PFTs (1/19): mild obstruction, some restriction => severely decreased DLCO. - High resolution CT chest (1/19): no interstitial lung disease or emphysema. 7 mm nodule RUL.  - anti-Jo1 negative, RF  borderline positive, SCL-70 negative, HIV negative. ANA + => anti-dsDNA negative, SSA/SSB negative, anti-Sm negative, anti-RNP negative. - V/Q scan 2/19: No e/o chronic PE.  - Echo (4/19): EF 60-65%, mild LVH, mild aortic stenosis, mild mitral stenosis, moderately dilated RV with mildly decreased systolic function, PASP 36 mmHg (poor envelope, may be underestimated).  - Does not tolerate ERAs (macitentan, ambrisentan).  12. Coronary angiography 11/18 without significant disease.  13. Mitral stenosis: Mild by cath in 1/19, mean gradient 2.6 mmHg. Mild on 4/19 echo.  14.  Aortic stenosis: Mild on 4/19 echo.  15. Fe deficiency anemia: EGD showed gastritis and c-scope showed diverticulosis and polyps in 4/19. Capsule endoscopy was unrevealing.  16. Pulmonary nodule: CT chest 7/19 with RUL nodule.   SH: Lives in [place], 2 kids, nonsmoker, rare ETOH.   FH: No pulmonary HTN.  Father and brother with MIs  Review of systems complete and found to be negative unless listed in HPI.   Current Outpatient Medications  Medication Sig Dispense Refill  . amLODipine (NORVASC) 5 MG tablet Take 0.5 tablets (2.5 mg total) by mouth daily. 45 tablet 3  . atorvastatin (LIPITOR) 20 MG tablet Take 20 mg by mouth every morning.    . carvedilol (COREG) 6.25 MG tablet TAKE 1 TABLET (6.25 MG TOTAL) BY MOUTH 2 (TWO) TIMES DAILY WITH A MEAL. 60 tablet 2  . cetirizine (ZYRTEC) 10 MG tablet Take 10 mg by mouth daily as needed for allergies.    . fenofibrate 160 MG tablet Take 160 mg by mouth every morning.    . ferrous sulfate 325 (65 FE) MG tablet Take 325 mg by mouth daily with breakfast.    . furosemide (LASIX) 40 MG tablet Take 120 mg (3 tabs) in AM and 80 mg (2 tabs) in PM 150 tablet 3  . glimepiride (AMARYL) 4 MG tablet Take 4 mg by mouth daily with breakfast.    . levothyroxine (SYNTHROID, LEVOTHROID) 125 MCG tablet Take 125 mcg by mouth daily before breakfast.    . loperamide (IMODIUM A-D) 2 MG tablet Take 2 mg by mouth 4 (four) times daily as needed for diarrhea or loose stools.    Marland Kitchen loratadine (CLARITIN) 10 MG tablet Take 10 mg by mouth daily.    . metFORMIN (GLUCOPHAGE-XR) 500 MG 24 hr tablet Take 500 mg by mouth daily with breakfast. Pt may take additional dose if needed    . metolazone (ZAROXOLYN) 2.5 MG tablet Take 1 tablet (2.5 mg total) by mouth once a week. Every Saturday 10 tablet 6  . Multiple Vitamins-Minerals (PRESERVISION AREDS 2 PO) Take 1 capsule by mouth 2 (two) times daily.    . pantoprazole (PROTONIX) 40 MG tablet Take 40 mg by mouth every morning.    .  potassium chloride (KLOR-CON M10) 10 MEQ tablet Take 2 tabs in AM and 1 tab in PM, take an extra tab on Saturdays 90 tablet 3  . Selexipag (UPTRAVI) 200 MCG TABS Take 8 tablets (1,600 mcg total) by mouth 2 (two) times daily. 420 tablet 3  . spironolactone (ALDACTONE) 25 MG tablet Take 0.5 tablets (12.5 mg total) by mouth daily. 15 tablet 3  . tadalafil, PAH, (ADCIRCA) 20 MG tablet Take 2 tablets (40 mg total) by mouth daily. 180 tablet 0   No current facility-administered medications for this encounter.    BP (!) 120/50   Pulse 67   Wt 74.2 kg (163 lb 8 oz)   SpO2 96%   BMI 26.79 kg/m  Wt Readings from Last 3 Encounters:  05/21/18 74.2 kg (163 lb 8 oz)  04/25/18 71.8 kg (158 lb 6.4 oz)  04/11/18 76.7 kg (169 lb 3.2 oz)   General: NAD Neck: JVP 9-10 cm, no thyromegaly or thyroid nodule.  Lungs: Crackles at bases.  CV: Nondisplaced PMI.  Heart regular S1/S2, no S3/S4, 3/6 SEM RUSB with clear S2.  2+ edema 3/4 up lower legs bilaterally.  No carotid bruit.  Normal pedal pulses.  Abdomen: Soft, nontender, no hepatosplenomegaly, no distention.  Skin: Intact without lesions or rashes.  Neurologic: Alert and oriented x 3.  Psych: Normal affect. Extremities: No clubbing or cyanosis.  HEENT: Normal.   Assessment/Plan: 1. Pulmonary hypertension:  Severe PAH with PVR 7 WU on 1/19 RHC.  Etiology uncertain.  She has a history of DVT remotely but V/Q scan in 2/19 did not show evidence for chronic PE. She has OSA and uses CPAP.  Serologic workup showed only positive ANA but when additional serologies were done, they were all negative (see above).  CT chest with no ILD or emphysema.  Valvular heart disease does not appear significant enough to cause this degree of PH.  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  Echo in 4/19 showed that the RV is moderately dilated with mildly decreased systolic function, PA systolic pressure estimation was only 36 mmHg but poor envelope and likely underestimated. She did not tolerate  Opsumit or ambrisentan due to significant lower extremity edema and worsening dyspnea.  6 minute walk today is mildly less than prior, but she is volume overloaded in the setting of recent ambrisentan use (has now stopped).  - Continue Adcirca 40 mg daily.  [name] is up to 1600 mcg BID - With borderline positive RF, CCP ab was checked and was negative.  2. Syncope: Suspect related to severe pulmonary hypertension.  None recently.   3. Mitral stenosis: Noted on echo and cath, mild.  No change.  4. H/o DVT: No chronic PE on V/Q scan.   5. Chronic diastolic CHF/RV failure: In setting of PAH and some degree of cor pulmonale.  NYHA class II-III symptoms, worse since she tried ambrisentan.  She is volume overloaded on exam and weight is up.   - Increase Lasix to 120 qam/80 qpm. Increase KCl to 40 qam/20 qpm. BMET today and again in 10 days.   - Continue metolazone 2.5 qSaturday, but will have her take an extra dose tomorrow.  She will take KCl 40 bid on metolazone days.   - Continue spiro 12.5 mg daily.  - Continue to wear compression stockings during the day 6. HTN: Recheck of BP yielded 120/50, which is baseline for her.  Continue amlodipine.  7. OSA: Continue CPAP qHS.  8. Lung nodule: Needs repeat noncontrast CT in 1/20 (6 months).  9. CKD: Stage 3.  BMET today.  10. Anemia: CBC today.    Followup in 3 wks with NP/PA and in 6 wks with me.   Loralie Champagne 05/21/2018

## 2018-05-31 ENCOUNTER — Ambulatory Visit (HOSPITAL_COMMUNITY)
Admission: RE | Admit: 2018-05-31 | Discharge: 2018-05-31 | Disposition: A | Discharge status: Home / Self Care | Payer: Medicare Other | Source: Ambulatory Visit | Attending: Internal Medicine | Admitting: Internal Medicine

## 2018-05-31 DIAGNOSIS — I272 Pulmonary hypertension, unspecified: Secondary | ICD-10-CM | POA: Insufficient documentation

## 2018-05-31 DIAGNOSIS — I5189 Other ill-defined heart diseases: Secondary | ICD-10-CM | POA: Insufficient documentation

## 2018-05-31 LAB — CBC
HCT: 36.9 % (ref 36.0–46.0)
Hemoglobin: 11.3 g/dL — ABNORMAL LOW (ref 12.0–15.0)
MCH: 28.9 pg (ref 26.0–34.0)
MCHC: 30.6 g/dL (ref 30.0–36.0)
MCV: 94.4 fL (ref 78.0–100.0)
PLATELETS: 329 10*3/uL (ref 150–400)
RBC: 3.91 MIL/uL (ref 3.87–5.11)
RDW: 15.2 % (ref 11.5–15.5)
WBC: 8 10*3/uL (ref 4.0–10.5)

## 2018-05-31 LAB — BASIC METABOLIC PANEL
ANION GAP: 12 (ref 5–15)
BUN: 33 mg/dL — ABNORMAL HIGH (ref 8–23)
CALCIUM: 8.7 mg/dL — AB (ref 8.9–10.3)
CO2: 25 mmol/L (ref 22–32)
Chloride: 101 mmol/L (ref 98–111)
Creatinine, Ser: 1.61 mg/dL — ABNORMAL HIGH (ref 0.44–1.00)
GFR, EST AFRICAN AMERICAN: 35 mL/min — AB (ref >60)
GFR, EST NON AFRICAN AMERICAN: 30 mL/min — AB (ref >60)
Glucose, Bld: 228 mg/dL — ABNORMAL HIGH (ref 70–99)
POTASSIUM: 3.2 mmol/L — AB (ref 3.5–5.1)
Sodium: 138 mmol/L (ref 135–145)

## 2018-06-02 ENCOUNTER — Other Ambulatory Visit (HOSPITAL_COMMUNITY): Payer: Self-pay | Admitting: Cardiology

## 2018-06-11 ENCOUNTER — Ambulatory Visit (HOSPITAL_COMMUNITY)
Admission: RE | Admit: 2018-06-11 | Discharge: 2018-06-11 | Disposition: A | Discharge status: Home / Self Care | Payer: Medicare Other | Source: Ambulatory Visit | Attending: Internal Medicine | Admitting: Internal Medicine

## 2018-06-11 VITALS — BP 138/60 | HR 83 | Wt 158.0 lb

## 2018-06-11 DIAGNOSIS — K589 Irritable bowel syndrome without diarrhea: Secondary | ICD-10-CM | POA: Diagnosis not present

## 2018-06-11 DIAGNOSIS — I130 Hypertensive heart and chronic kidney disease with heart failure and stage 1 through stage 4 chronic kidney disease, or unspecified chronic kidney disease: Secondary | ICD-10-CM | POA: Insufficient documentation

## 2018-06-11 DIAGNOSIS — I272 Pulmonary hypertension, unspecified: Secondary | ICD-10-CM | POA: Diagnosis not present

## 2018-06-11 DIAGNOSIS — I739 Peripheral vascular disease, unspecified: Secondary | ICD-10-CM | POA: Insufficient documentation

## 2018-06-11 DIAGNOSIS — E1122 Type 2 diabetes mellitus with diabetic chronic kidney disease: Secondary | ICD-10-CM | POA: Insufficient documentation

## 2018-06-11 DIAGNOSIS — E785 Hyperlipidemia, unspecified: Secondary | ICD-10-CM | POA: Insufficient documentation

## 2018-06-11 DIAGNOSIS — R55 Syncope and collapse: Secondary | ICD-10-CM | POA: Diagnosis not present

## 2018-06-11 DIAGNOSIS — R911 Solitary pulmonary nodule: Secondary | ICD-10-CM | POA: Insufficient documentation

## 2018-06-11 DIAGNOSIS — Z7984 Long term (current) use of oral hypoglycemic drugs: Secondary | ICD-10-CM | POA: Insufficient documentation

## 2018-06-11 DIAGNOSIS — E039 Hypothyroidism, unspecified: Secondary | ICD-10-CM | POA: Diagnosis not present

## 2018-06-11 DIAGNOSIS — I5032 Chronic diastolic (congestive) heart failure: Secondary | ICD-10-CM | POA: Diagnosis not present

## 2018-06-11 DIAGNOSIS — N183 Chronic kidney disease, stage 3 unspecified: Secondary | ICD-10-CM

## 2018-06-11 DIAGNOSIS — D649 Anemia, unspecified: Secondary | ICD-10-CM | POA: Diagnosis not present

## 2018-06-11 DIAGNOSIS — Z79899 Other long term (current) drug therapy: Secondary | ICD-10-CM | POA: Diagnosis not present

## 2018-06-11 DIAGNOSIS — Z9989 Dependence on other enabling machines and devices: Secondary | ICD-10-CM

## 2018-06-11 DIAGNOSIS — G4733 Obstructive sleep apnea (adult) (pediatric): Secondary | ICD-10-CM | POA: Diagnosis not present

## 2018-06-11 LAB — BASIC METABOLIC PANEL
ANION GAP: 14 (ref 5–15)
BUN: 30 mg/dL — ABNORMAL HIGH (ref 8–23)
CALCIUM: 9 mg/dL (ref 8.9–10.3)
CO2: 23 mmol/L (ref 22–32)
Chloride: 101 mmol/L (ref 98–111)
Creatinine, Ser: 1.55 mg/dL — ABNORMAL HIGH (ref 0.44–1.00)
GFR calc Af Amer: 37 mL/min — ABNORMAL LOW (ref >60)
GFR, EST NON AFRICAN AMERICAN: 32 mL/min — AB (ref >60)
GLUCOSE: 140 mg/dL — AB (ref 70–99)
Potassium: 4.1 mmol/L (ref 3.5–5.1)
SODIUM: 138 mmol/L (ref 135–145)

## 2018-06-11 LAB — CBC
HCT: 41.4 % (ref 36.0–46.0)
HEMOGLOBIN: 12.7 g/dL (ref 12.0–15.0)
MCH: 28.9 pg (ref 26.0–34.0)
MCHC: 30.7 g/dL (ref 30.0–36.0)
MCV: 94.1 fL (ref 78.0–100.0)
PLATELETS: 406 10*3/uL — AB (ref 150–400)
RBC: 4.4 MIL/uL (ref 3.87–5.11)
RDW: 14.9 % (ref 11.5–15.5)
WBC: 7.2 10*3/uL (ref 4.0–10.5)

## 2018-06-11 LAB — BRAIN NATRIURETIC PEPTIDE: B NATRIURETIC PEPTIDE 5: 641.4 pg/mL — AB (ref 0.0–100.0)

## 2018-06-11 NOTE — Patient Instructions (Signed)
No medication changes  Labs today We will only contact you if something comes back abnormal or we need to make some changes. Otherwise no news is good news!  Your physician recommends that you schedule a follow-up appointment in: as scheduled with Dr Aundra Dubin  Do the following things EVERYDAY: 1) Weigh yourself in the morning before breakfast. Write it down and keep it in a log. 2) Take your medicines as prescribed 3) Eat low salt foods-Limit salt (sodium) to 2000 mg per day.  4) Stay as active as you can everyday 5) Limit all fluids for the day to less than 2 liters

## 2018-06-11 NOTE — Progress Notes (Signed)
PCP: Dr. Dimas Aguas Cardiology: Dr. Wynonia Lawman HF Cardiology: Dr. Aundra Dubin  77 yo with history of diabetes, HTN, PAD, hyperlipidemia, and PAH  For > 1 year, she has had significant exertional dyspnea. It has been steadily worsening, especially over the last few months.  She has been extensively worked up so far.  Echo in 4/18 showed preserved EF 65% with moderate pulmonary hypertension.  She had episodes of syncope in 5/18 and 10/18.  She wore an event monitor in 5/18 with no significant arrhythmia.  LHC/RHC in 1/19 showed normal PCWP and severely elevated PA pressure, no significant CAD.    At initial appointment in 1/19, she was noted to be hypoxemic with exertion and home oxygen was started for use with exertion.  I also started her on Opsumit. She had to stop this after about a week due to significantly increased exertional peripheral edema.  This resolved after Opsumit was stopped.   Echo in 4/19.  This showed EF 60-65%, mild MS, mild AS, moderately dilated RV with mildly decreased systolic function.   She was found to have Fe deficiency anemia, FOBT+.  EGD showed gastritis and c-scope showed diverticulosis and polyps in 4/19.  She get IV Fe.  She is going to have a capsule endoscopy.   She tried to start ambrisentan, but developed worsening dyspnea and peripheral edema and low oxygen saturation.  This improved after ambrisentan was stopped.   Today she returns for HF follow up. Last visit lasix was increased to 120 mg/80 mg. Says she sometimes doesn't take all of her potassium. Overall feeling much better. Able to walk around the grocery store without difficulty. Now able to change the sheets on the bed. Does admit to dyspnea when carrying items in the house. Able to walk her dog. Denies PND/Orthopnea. Denies syncope/presyncope. Using CPAP nightly. Appetite ok. No fever or chills. Weight at home 152-153 pounds. Has had dark stool for the last couple of days. Taking all medications. Continues to work a  few days a week.   6 minute walk (1/19): 92 m, oxygen saturation dropped to 70%.   6 minute walk (3/19): 122 m 6 minute walk (4/19): 392 m 6 minute walk (9/19): [place] (1/19): anti-Jo1 negative, RF borderline positive, SCL-70 negative, HIV negative. ESR 21. BNP 1129.  Labs (3/19): ANA + => anti-dsDNA negative, SSA/SSB negative, anti-Sm negative, anti-RNP negative. K 3.8 => 3.7, creatinine 1.4 => 1.55.  Labs (4/19): K 4.1 => 3.1, creatinine 1.58 => 1.2, BNP 1902, CCP Ab negative.  Labs (6/19): hgb 9, creatinine 1.66, K 3 Labs (7/19): K 4.9, creatinine 1.7 Labs (8/19): K 3.6, creatinine 1.36 Labs (05/31/2018): K 3.2 Creatinine 1.6   PMH: 1. PAD: Non-healing left ankle ulcer.  - ABIs 9/12 were normal.  2. Type 2 diabetes 3. Hypothyroidism 4. CKD stage 3 5. Hyperlipidemia 6. HTN 7. IBS 8. OSA: Mild, on CPAP.  9. Event monitor 5/18: No significant abnormality.  10. DVT in 1995 11. Pulmonary hypertension: Echo (4/18) with EF 65%, mild MR, mild TR, mild mitral stenosis, moderate pulmonary hypertension. - CTA chest 10/17: No PE.  - RHC (1/19): mean RA 11, PA 87/31 mean 51, mean PCWP 6, CI 3.58, PVR 7 WU.  - PFTs (1/19): mild obstruction, some restriction => severely decreased DLCO. - High resolution CT chest (1/19): no interstitial lung disease or emphysema. 7 mm nodule RUL.  - anti-Jo1 negative, RF borderline positive, SCL-70 negative, HIV negative. ANA + => anti-dsDNA negative, SSA/SSB negative, anti-Sm negative,  anti-RNP negative. - V/Q scan 2/19: No e/o chronic PE.  - Echo (4/19): EF 60-65%, mild LVH, mild aortic stenosis, mild mitral stenosis, moderately dilated RV with mildly decreased systolic function, PASP 36 mmHg (poor envelope, may be underestimated).  - Does not tolerate ERAs (macitentan, ambrisentan).  12. Coronary angiography 11/18 without significant disease.  13. Mitral stenosis: Mild by cath in 1/19, mean gradient 2.6 mmHg. Mild on 4/19 echo.  14. Aortic stenosis:  Mild on 4/19 echo.  15. Fe deficiency anemia: EGD showed gastritis and c-scope showed diverticulosis and polyps in 4/19. Capsule endoscopy was unrevealing.  16. Pulmonary nodule: CT chest 7/19 with RUL nodule.   SH: Lives in [place], 2 kids, nonsmoker, rare ETOH.   FH: No pulmonary HTN.  Father and brother with MIs  Review of systems complete and found to be negative unless listed in HPI.   Current Outpatient Medications  Medication Sig Dispense Refill  . amLODipine (NORVASC) 5 MG tablet Take 0.5 tablets (2.5 mg total) by mouth daily. 45 tablet 3  . atorvastatin (LIPITOR) 20 MG tablet Take 20 mg by mouth every morning.    . carvedilol (COREG) 6.25 MG tablet TAKE 1 TABLET (6.25 MG TOTAL) BY MOUTH 2 (TWO) TIMES DAILY WITH A MEAL. 60 tablet 2  . cetirizine (ZYRTEC) 10 MG tablet Take 10 mg by mouth daily as needed for allergies.    . fenofibrate 160 MG tablet Take 160 mg by mouth every morning.    . ferrous sulfate 325 (65 FE) MG tablet Take 325 mg by mouth daily with breakfast.    . furosemide (LASIX) 40 MG tablet Take 120 mg (3 tabs) in AM and 80 mg (2 tabs) in PM 150 tablet 3  . glimepiride (AMARYL) 4 MG tablet Take 4 mg by mouth daily with breakfast.    . levothyroxine (SYNTHROID, LEVOTHROID) 125 MCG tablet Take 125 mcg by mouth daily before breakfast.    . loperamide (IMODIUM A-D) 2 MG tablet Take 2 mg by mouth 4 (four) times daily as needed for diarrhea or loose stools.    Marland Kitchen loratadine (CLARITIN) 10 MG tablet Take 10 mg by mouth daily.    . metFORMIN (GLUCOPHAGE-XR) 500 MG 24 hr tablet Take 500 mg by mouth daily with breakfast. Pt may take additional dose if needed    . metolazone (ZAROXOLYN) 2.5 MG tablet Take 1 tablet (2.5 mg total) by mouth once a week. Every Saturday 10 tablet 6  . Multiple Vitamins-Minerals (PRESERVISION AREDS 2 PO) Take 1 capsule by mouth 2 (two) times daily.    . pantoprazole (PROTONIX) 40 MG tablet Take 40 mg by mouth every morning.    . potassium chloride  (KLOR-CON M10) 10 MEQ tablet Take 2 tabs in AM and 1 tab in PM, take an extra tab on Saturdays 90 tablet 3  . Selexipag (UPTRAVI) 200 MCG TABS Take 8 tablets (1,600 mcg total) by mouth 2 (two) times daily. 420 tablet 3  . tadalafil, PAH, (ADCIRCA) 20 MG tablet Take 2 tablets (40 mg total) by mouth daily. 180 tablet 0  . spironolactone (ALDACTONE) 25 MG tablet Take 0.5 tablets (12.5 mg total) by mouth daily. 15 tablet 3   No current facility-administered medications for this encounter.    BP 138/60 (BP Location: Left Arm, Patient Position: Sitting, Cuff Size: Normal)   Pulse 83   Wt 71.7 kg (158 lb)   SpO2 95%   BMI 25.89 kg/m   Wt Readings from Last 3 Encounters:  06/11/18  71.7 kg (158 lb)  05/21/18 74.2 kg (163 lb 8 oz)  04/25/18 71.8 kg (158 lb 6.4 oz)   General:  Well appearing. No resp difficulty. Walked in the clinic without difficulty.  HEENT: normal Neck: supple. JVP 6-7  Carotids 2+ bilat; no bruits. No lymphadenopathy or thryomegaly appreciated. Cor: PMI nondisplaced. Regular rate & rhythm. No rubs, gallops. 3/6 SEM RUSB with S2 Lungs: clear Abdomen: soft, nontender, nondistended. No hepatosplenomegaly. No bruits or masses. Good bowel sounds. Extremities: no cyanosis, clubbing, rash, R and LLE trace edema.  Neuro: alert & orientedx3, cranial nerves grossly intact. moves all 4 extremities w/o difficulty. Affect pleasant  Assessment/Plan: 1. Pulmonary hypertension:  Severe PAH with PVR 7 WU on 1/19 RHC.  Etiology uncertain.  She has a history of DVT remotely but V/Q scan in 2/19 did not show evidence for chronic PE. She has OSA and uses CPAP.  Serologic workup showed only positive ANA but when additional serologies were done, they were all negative (see above).  CT chest with no ILD or emphysema.  Valvular heart disease does not appear significant enough to cause this degree of PH.  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  Echo in 4/19 showed that the RV is moderately dilated with mildly decreased  systolic function, PA systolic pressure estimation was only 36 mmHg but poor envelope and likely underestimated. She did not tolerate Opsumit or ambrisentan due to significant lower extremity edema and worsening dyspnea.  NYHA II-III. Volume status improved. Continue lasix 120 mg/80 mg with metolazone on Saturday.   - Continue Adcirca 40 mg daily.  [name] is up to 1600 mcg BID - With borderline positive RF, CCP ab was checked and was negative.  2. Syncope: Suspect related to severe pulmonary hypertension.   Resolved.   3. Mitral stenosis: Noted on echo and cath, mild.  No change.  4. H/o DVT: No chronic PE on V/Q scan.   5. Chronic diastolic CHF/RV failure: In setting of PAH and some degree of cor pulmonale.  NYHA class II-III symptoms, worse since she tried ambrisentan.   As above continue current regimen. Volume status stable.  -Continue  Lasix to 120 qam/80 qpm. Continue  KCl to 40 qam/20 qpm.   Of note she was on torsemide but this was stopped because she had a better response with lasix. - Continue metolazone 2.5 qSaturday - Continue spiro 12.5 mg daily.  - Continue to wear compression stockings during the day 6. HTN: Stable.  7. OSA: Continue CPAP qHS.  8. Lung nodule: Needs repeat noncontrast CT in 1/20 (6 months).  9. CKD: Stage 3 BMEt today was stable. Creatinine 1.55.  10. Anemia:  Check CBC today. Hgb stable 12.7   . Follow up with Dr Aundra Dubin in 3-4 weeks. We discussed Pulmonary Rehab and will consider. Suspect she would benefit and could go closer to her home.    Amy Clegg NP-C  06/11/2018

## 2018-06-23 ENCOUNTER — Other Ambulatory Visit (HOSPITAL_COMMUNITY): Payer: Self-pay | Admitting: Cardiology

## 2018-07-06 ENCOUNTER — Ambulatory Visit (HOSPITAL_COMMUNITY)
Admission: RE | Admit: 2018-07-06 | Discharge: 2018-07-06 | Disposition: A | Discharge status: Home / Self Care | Payer: Medicare Other | Source: Ambulatory Visit | Attending: Cardiology | Admitting: Cardiology

## 2018-07-06 ENCOUNTER — Encounter (HOSPITAL_COMMUNITY): Payer: Self-pay | Admitting: Cardiology

## 2018-07-06 VITALS — BP 125/54 | HR 80 | Wt 164.8 lb

## 2018-07-06 DIAGNOSIS — K589 Irritable bowel syndrome without diarrhea: Secondary | ICD-10-CM | POA: Diagnosis not present

## 2018-07-06 DIAGNOSIS — I130 Hypertensive heart and chronic kidney disease with heart failure and stage 1 through stage 4 chronic kidney disease, or unspecified chronic kidney disease: Secondary | ICD-10-CM | POA: Insufficient documentation

## 2018-07-06 DIAGNOSIS — R55 Syncope and collapse: Secondary | ICD-10-CM | POA: Diagnosis not present

## 2018-07-06 DIAGNOSIS — Z8249 Family history of ischemic heart disease and other diseases of the circulatory system: Secondary | ICD-10-CM | POA: Insufficient documentation

## 2018-07-06 DIAGNOSIS — I080 Rheumatic disorders of both mitral and aortic valves: Secondary | ICD-10-CM | POA: Diagnosis not present

## 2018-07-06 DIAGNOSIS — Z79899 Other long term (current) drug therapy: Secondary | ICD-10-CM | POA: Diagnosis not present

## 2018-07-06 DIAGNOSIS — G4733 Obstructive sleep apnea (adult) (pediatric): Secondary | ICD-10-CM | POA: Diagnosis not present

## 2018-07-06 DIAGNOSIS — Z79890 Hormone replacement therapy (postmenopausal): Secondary | ICD-10-CM | POA: Diagnosis not present

## 2018-07-06 DIAGNOSIS — E1151 Type 2 diabetes mellitus with diabetic peripheral angiopathy without gangrene: Secondary | ICD-10-CM | POA: Diagnosis not present

## 2018-07-06 DIAGNOSIS — R911 Solitary pulmonary nodule: Secondary | ICD-10-CM | POA: Diagnosis not present

## 2018-07-06 DIAGNOSIS — I50812 Chronic right heart failure: Secondary | ICD-10-CM | POA: Insufficient documentation

## 2018-07-06 DIAGNOSIS — I272 Pulmonary hypertension, unspecified: Secondary | ICD-10-CM

## 2018-07-06 DIAGNOSIS — I5032 Chronic diastolic (congestive) heart failure: Secondary | ICD-10-CM | POA: Diagnosis not present

## 2018-07-06 DIAGNOSIS — Z86718 Personal history of other venous thrombosis and embolism: Secondary | ICD-10-CM | POA: Diagnosis not present

## 2018-07-06 DIAGNOSIS — E785 Hyperlipidemia, unspecified: Secondary | ICD-10-CM | POA: Diagnosis not present

## 2018-07-06 DIAGNOSIS — N183 Chronic kidney disease, stage 3 (moderate): Secondary | ICD-10-CM | POA: Insufficient documentation

## 2018-07-06 DIAGNOSIS — D509 Iron deficiency anemia, unspecified: Secondary | ICD-10-CM | POA: Diagnosis not present

## 2018-07-06 DIAGNOSIS — E039 Hypothyroidism, unspecified: Secondary | ICD-10-CM | POA: Insufficient documentation

## 2018-07-06 DIAGNOSIS — I2721 Secondary pulmonary arterial hypertension: Secondary | ICD-10-CM | POA: Insufficient documentation

## 2018-07-06 DIAGNOSIS — E1122 Type 2 diabetes mellitus with diabetic chronic kidney disease: Secondary | ICD-10-CM | POA: Insufficient documentation

## 2018-07-06 DIAGNOSIS — Z7984 Long term (current) use of oral hypoglycemic drugs: Secondary | ICD-10-CM | POA: Insufficient documentation

## 2018-07-06 LAB — BASIC METABOLIC PANEL
Anion gap: 9 (ref 5–15)
BUN: 39 mg/dL — AB (ref 8–23)
CO2: 24 mmol/L (ref 22–32)
CREATININE: 1.88 mg/dL — AB (ref 0.44–1.00)
Calcium: 9.1 mg/dL (ref 8.9–10.3)
Chloride: 107 mmol/L (ref 98–111)
GFR calc non Af Amer: 25 mL/min — ABNORMAL LOW (ref >60)
GFR, EST AFRICAN AMERICAN: 29 mL/min — AB (ref >60)
GLUCOSE: 117 mg/dL — AB (ref 70–99)
Potassium: 4.7 mmol/L (ref 3.5–5.1)
Sodium: 140 mmol/L (ref 135–145)

## 2018-07-06 LAB — BRAIN NATRIURETIC PEPTIDE: B Natriuretic Peptide: 595 pg/mL — ABNORMAL HIGH (ref 0.0–100.0)

## 2018-07-06 NOTE — Patient Instructions (Signed)
No changes to current medication regimen  Lab work was done today, office will notify you if any results are abnormal  Your physician recommends that you schedule a follow-up appointment in: 3 months

## 2018-07-08 NOTE — Progress Notes (Signed)
PCP: Dr. Dimas Aguas Cardiology: Dr. Wynonia Lawman HF Cardiology: Dr. Aundra Dubin  77 y.o.with history of diabetes, HTN, PAD, hyperlipidemia who was referred by Dr. Wynonia Lawman for evaluation of pulmonary hypertension.   For > 1 year, she has had significant exertional dyspnea. It has been steadily worsening, especially over the last few months.  She has been extensively worked up so far.  Echo in 4/18 showed preserved EF 65% with moderate pulmonary hypertension.  She had episodes of syncope in 5/18 and 10/18.  She wore an event monitor in 5/18 with no significant arrhythmia.  LHC/RHC in 1/19 showed normal PCWP and severely elevated PA pressure, no significant CAD.    At initial appointment in 1/19, she was noted to be hypoxemic with exertion and home oxygen was started for use with exertion.  I also started her on Opsumit. She had to stop this after about a week due to significantly increased exertional peripheral edema.  This resolved after Opsumit was stopped.   Echo in 4/19.  This showed EF 60-65%, mild MS, mild AS, moderately dilated RV with mildly decreased systolic function.   She was found to have Fe deficiency anemia, FOBT+.  EGD showed gastritis and c-scope showed diverticulosis and polyps in 4/19.  She get IV Fe.  She is going to have a capsule endoscopy.   She tried to start ambrisentan, but developed worsening dyspnea and peripheral edema and low oxygen saturation.  This improved after ambrisentan was stopped.   She returns for follow up of RV failure and pulm HTN.  She is breathing well, doing what she wants.  Mild dyspnea when she vacuums.  She has been able to weed-eat and washed her windows. Mild dyspnea with a flight of stairs.  She is able to walk around her block in about 10 minutes without stopping.  Feels like she is living normally.   6 minute walk (1/19): 92 m, oxygen saturation dropped to 70%.   6 minute walk (3/19): 122 m 6 minute walk (4/19): 392 m 6 minute walk (9/19): [place]  (1/19): anti-Jo1 negative, RF borderline positive, SCL-70 negative, HIV negative. ESR 21. BNP 1129.  Labs (3/19): ANA + => anti-dsDNA negative, SSA/SSB negative, anti-Sm negative, anti-RNP negative. K 3.8 => 3.7, creatinine 1.4 => 1.55.  Labs (4/19): K 4.1 => 3.1, creatinine 1.58 => 1.2, BNP 1902, CCP Ab negative.  Labs (6/19): hgb 9, creatinine 1.66, K 3 Labs (7/19): K 4.9, creatinine 1.7 Labs (8/19): K 3.6, creatinine 1.36 Labs (9/19): K 4.1, creatinine 1.55, BNP 641  PMH: 1. PAD: Non-healing left ankle ulcer.  - ABIs 9/12 were normal.  2. Type 2 diabetes 3. Hypothyroidism 4. CKD stage 3 5. Hyperlipidemia 6. HTN 7. IBS 8. OSA: Mild, on CPAP.  9. Event monitor 5/18: No significant abnormality.  10. DVT in 1995 11. Pulmonary hypertension: Echo (4/18) with EF 65%, mild MR, mild TR, mild mitral stenosis, moderate pulmonary hypertension. - CTA chest 10/17: No PE.  - RHC (1/19): mean RA 11, PA 87/31 mean 51, mean PCWP 6, CI 3.58, PVR 7 WU.  - PFTs (1/19): mild obstruction, some restriction => severely decreased DLCO. - High resolution CT chest (1/19): no interstitial lung disease or emphysema. 7 mm nodule RUL.  - anti-Jo1 negative, RF borderline positive, SCL-70 negative, HIV negative. ANA + => anti-dsDNA negative, SSA/SSB negative, anti-Sm negative, anti-RNP negative. - V/Q scan 2/19: No e/o chronic PE.  - Echo (4/19): EF 60-65%, mild LVH, mild aortic stenosis, mild mitral stenosis, moderately  dilated RV with mildly decreased systolic function, PASP 36 mmHg (poor envelope, may be underestimated).  - Does not tolerate ERAs (macitentan, ambrisentan).  12. Coronary angiography 11/18 without significant disease.  13. Mitral stenosis: Mild by cath in 1/19, mean gradient 2.6 mmHg. Mild on 4/19 echo.  14. Aortic stenosis: Mild on 4/19 echo.  15. Fe deficiency anemia: EGD showed gastritis and c-scope showed diverticulosis and polyps in 4/19. Capsule endoscopy was unrevealing.  16. Pulmonary  nodule: CT chest 7/19 with RUL nodule.   SH: Lives in [place], 2 kids, nonsmoker, rare ETOH.   FH: No pulmonary HTN.  Father and brother with MIs  Review of systems complete and found to be negative unless listed in HPI.   Current Outpatient Medications  Medication Sig Dispense Refill  . amLODipine (NORVASC) 5 MG tablet Take 0.5 tablets (2.5 mg total) by mouth daily. 45 tablet 3  . atorvastatin (LIPITOR) 20 MG tablet Take 20 mg by mouth every morning.    . carvedilol (COREG) 6.25 MG tablet TAKE 1 TABLET (6.25 MG TOTAL) BY MOUTH 2 (TWO) TIMES DAILY WITH A MEAL. 60 tablet 2  . cetirizine (ZYRTEC) 10 MG tablet Take 10 mg by mouth daily as needed for allergies.    . fenofibrate 160 MG tablet Take 160 mg by mouth every morning.    . ferrous sulfate 325 (65 FE) MG tablet Take 325 mg by mouth daily with breakfast.    . furosemide (LASIX) 40 MG tablet Take 120 mg (3 tabs) in AM and 80 mg (2 tabs) in PM 150 tablet 3  . glimepiride (AMARYL) 4 MG tablet Take 4 mg by mouth daily with breakfast.    . levothyroxine (SYNTHROID, LEVOTHROID) 125 MCG tablet Take 125 mcg by mouth daily before breakfast.    . loperamide (IMODIUM A-D) 2 MG tablet Take 2 mg by mouth 4 (four) times daily as needed for diarrhea or loose stools.    Marland Kitchen loratadine (CLARITIN) 10 MG tablet Take 10 mg by mouth daily.    . metFORMIN (GLUCOPHAGE-XR) 500 MG 24 hr tablet Take 500 mg by mouth daily with breakfast. Pt may take additional dose if needed    . Multiple Vitamins-Minerals (PRESERVISION AREDS 2 PO) Take 1 capsule by mouth 2 (two) times daily.    . pantoprazole (PROTONIX) 40 MG tablet Take 40 mg by mouth every morning.    . potassium chloride (KLOR-CON M10) 10 MEQ tablet Take 2 tabs in AM and 1 tab in PM, take an extra tab on Saturdays 90 tablet 3  . Selexipag (UPTRAVI) 200 MCG TABS Take 8 tablets (1,600 mcg total) by mouth 2 (two) times daily. 420 tablet 3  . spironolactone (ALDACTONE) 25 MG tablet TAKE 0.5 TABLETS (12.5 MG  TOTAL) BY MOUTH DAILY. 45 tablet 1  . tadalafil, PAH, (ADCIRCA) 20 MG tablet Take 2 tablets (40 mg total) by mouth daily. 180 tablet 0  . metolazone (ZAROXOLYN) 2.5 MG tablet Take 1 tablet (2.5 mg total) by mouth once a week. Every Saturday 10 tablet 6   No current facility-administered medications for this encounter.    BP (!) 125/54   Pulse 80   Wt 74.8 kg (164 lb 12.8 oz)   SpO2 95%   BMI 27.01 kg/m   Wt Readings from Last 3 Encounters:  07/06/18 74.8 kg (164 lb 12.8 oz)  06/11/18 71.7 kg (158 lb)  05/21/18 74.2 kg (163 lb 8 oz)   General: NAD Neck: JVP 7-8 cm, no thyromegaly or thyroid  nodule.  Lungs: Clear to auscultation bilaterally with normal respiratory effort. CV: Nondisplaced PMI.  Heart regular S1/S2, no S3/S4, 2/6 early SEM RUSB.  1+ edema 1/2 up lower legs bilaterally.  No carotid bruit.  Normal pedal pulses.  Abdomen: Soft, nontender, no hepatosplenomegaly, no distention.  Skin: Intact without lesions or rashes.  Neurologic: Alert and oriented x 3.  Psych: Normal affect. Extremities: No clubbing or cyanosis.  HEENT: Normal.   Assessment/Plan: 1. Pulmonary hypertension:  Severe PAH with PVR 7 WU on 1/19 RHC.  Etiology uncertain.  She has a history of DVT remotely but V/Q scan in 2/19 did not show evidence for chronic PE. She has OSA and uses CPAP.  Serologic workup showed only positive ANA but when additional serologies were done, they were all negative (see above).  CT chest with no ILD or emphysema.  Valvular heart disease does not appear significant enough to cause this degree of PH.  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  Echo in 4/19 showed that the RV is moderately dilated with mildly decreased systolic function, PA systolic pressure estimation was only 36 mmHg but poor envelope and likely underestimated. She did not tolerate Opsumit or ambrisentan due to significant lower extremity edema and worsening dyspnea.  NYHA class II symptoms.   - Continue Adcirca 40 mg daily.  [name] is  up to 1600 mcg BID - With borderline positive RF, CCP ab was checked and was negative.  - BNP today.  2. Syncope: Suspect related to severe pulmonary hypertension.  None recently.   3. Mitral stenosis: Noted on echo and cath, mild.  No change.  4. H/o DVT: No chronic PE on V/Q scan.   5. Chronic diastolic CHF/RV failure: In setting of PAH and some degree of cor pulmonale.  NYHA class II symptoms, minimal volume overload on exam.   - Continue Lasix 120 qam/80 qpm. Continue KCl 20 qam/10 qpm. BMET today.  - Continue metolazone 2.5 qSaturday.  She will take KCl 40 bid on metolazone days.   - Continue spiro 12.5 mg daily.  - Continue to wear compression stockings during the day 6. HTN: BP controlled.  Continue amlodipine.  7. OSA: Continue CPAP qHS.  8. Lung nodule: Needs repeat noncontrast CT in 1/20 (6 months).  9. CKD: Stage 3.  BMET today.    Followup in 3 months.    Loralie Champagne 07/08/2018

## 2018-08-23 ENCOUNTER — Other Ambulatory Visit (HOSPITAL_COMMUNITY): Payer: Self-pay | Admitting: Surgery

## 2018-08-23 MED ORDER — TADALAFIL (PAH) 20 MG PO TABS: 40.0000 mg | ORAL_TABLET | Freq: Every day | ORAL | 3 refills | Status: DC

## 2018-08-23 NOTE — Telephone Encounter (Signed)
Refill prescription sent to [facility] for patient's Tadalafil.

## 2018-08-29 ENCOUNTER — Telehealth (HOSPITAL_COMMUNITY): Payer: Self-pay

## 2018-08-29 NOTE — Telephone Encounter (Signed)
Prior authorization through [employer] was initiated for Uptravi medication and sent via cmm on 08/29/2018.

## 2018-08-29 NOTE — Telephone Encounter (Signed)
Prior authorization through [employer] insurance company was APPROVED for Uptravi and will expire on 09/12/2019.   

## 2018-10-08 ENCOUNTER — Encounter (HOSPITAL_COMMUNITY): Payer: Self-pay | Admitting: Cardiology

## 2018-10-08 ENCOUNTER — Telehealth (HOSPITAL_COMMUNITY): Payer: Self-pay | Admitting: *Deleted

## 2018-10-08 ENCOUNTER — Ambulatory Visit (HOSPITAL_COMMUNITY)
Admission: RE | Admit: 2018-10-08 | Discharge: 2018-10-08 | Disposition: A | Discharge status: Home / Self Care | Payer: Medicare Other | Source: Ambulatory Visit | Attending: Cardiology | Admitting: Cardiology

## 2018-10-08 VITALS — BP 110/64 | HR 65 | Wt 168.4 lb

## 2018-10-08 DIAGNOSIS — I080 Rheumatic disorders of both mitral and aortic valves: Secondary | ICD-10-CM | POA: Insufficient documentation

## 2018-10-08 DIAGNOSIS — Z79890 Hormone replacement therapy (postmenopausal): Secondary | ICD-10-CM | POA: Diagnosis not present

## 2018-10-08 DIAGNOSIS — I272 Pulmonary hypertension, unspecified: Secondary | ICD-10-CM | POA: Diagnosis not present

## 2018-10-08 DIAGNOSIS — R55 Syncope and collapse: Secondary | ICD-10-CM | POA: Insufficient documentation

## 2018-10-08 DIAGNOSIS — E1122 Type 2 diabetes mellitus with diabetic chronic kidney disease: Secondary | ICD-10-CM | POA: Diagnosis not present

## 2018-10-08 DIAGNOSIS — Z7984 Long term (current) use of oral hypoglycemic drugs: Secondary | ICD-10-CM | POA: Insufficient documentation

## 2018-10-08 DIAGNOSIS — E1151 Type 2 diabetes mellitus with diabetic peripheral angiopathy without gangrene: Secondary | ICD-10-CM

## 2018-10-08 DIAGNOSIS — G4733 Obstructive sleep apnea (adult) (pediatric): Secondary | ICD-10-CM | POA: Diagnosis not present

## 2018-10-08 DIAGNOSIS — I130 Hypertensive heart and chronic kidney disease with heart failure and stage 1 through stage 4 chronic kidney disease, or unspecified chronic kidney disease: Secondary | ICD-10-CM | POA: Insufficient documentation

## 2018-10-08 DIAGNOSIS — Z79899 Other long term (current) drug therapy: Secondary | ICD-10-CM | POA: Diagnosis not present

## 2018-10-08 DIAGNOSIS — N183 Chronic kidney disease, stage 3 unspecified: Secondary | ICD-10-CM

## 2018-10-08 DIAGNOSIS — R079 Chest pain, unspecified: Secondary | ICD-10-CM | POA: Diagnosis not present

## 2018-10-08 DIAGNOSIS — R918 Other nonspecific abnormal finding of lung field: Secondary | ICD-10-CM | POA: Insufficient documentation

## 2018-10-08 DIAGNOSIS — I2510 Atherosclerotic heart disease of native coronary artery without angina pectoris: Secondary | ICD-10-CM | POA: Diagnosis not present

## 2018-10-08 DIAGNOSIS — E785 Hyperlipidemia, unspecified: Secondary | ICD-10-CM | POA: Diagnosis not present

## 2018-10-08 DIAGNOSIS — I5032 Chronic diastolic (congestive) heart failure: Secondary | ICD-10-CM | POA: Diagnosis not present

## 2018-10-08 DIAGNOSIS — E039 Hypothyroidism, unspecified: Secondary | ICD-10-CM | POA: Insufficient documentation

## 2018-10-08 DIAGNOSIS — Z86718 Personal history of other venous thrombosis and embolism: Secondary | ICD-10-CM | POA: Insufficient documentation

## 2018-10-08 DIAGNOSIS — I5022 Chronic systolic (congestive) heart failure: Secondary | ICD-10-CM

## 2018-10-08 LAB — CBC
HCT: 39.7 % (ref 36.0–46.0)
Hemoglobin: 12.2 g/dL (ref 12.0–15.0)
MCH: 28.6 pg (ref 26.0–34.0)
MCHC: 30.7 g/dL (ref 30.0–36.0)
MCV: 93.2 fL (ref 80.0–100.0)
NRBC: 0.2 % (ref 0.0–0.2)
PLATELETS: 355 10*3/uL (ref 150–400)
RBC: 4.26 MIL/uL (ref 3.87–5.11)
RDW: 15 % (ref 11.5–15.5)
WBC: 9.9 10*3/uL (ref 4.0–10.5)

## 2018-10-08 LAB — BASIC METABOLIC PANEL
ANION GAP: 13 (ref 5–15)
BUN: 53 mg/dL — ABNORMAL HIGH (ref 8–23)
CALCIUM: 9.3 mg/dL (ref 8.9–10.3)
CO2: 20 mmol/L — ABNORMAL LOW (ref 22–32)
Chloride: 104 mmol/L (ref 98–111)
Creatinine, Ser: 2.57 mg/dL — ABNORMAL HIGH (ref 0.44–1.00)
GFR calc Af Amer: 20 mL/min — ABNORMAL LOW (ref >60)
GFR, EST NON AFRICAN AMERICAN: 17 mL/min — AB (ref >60)
Glucose, Bld: 148 mg/dL — ABNORMAL HIGH (ref 70–99)
Potassium: 4.1 mmol/L (ref 3.5–5.1)
SODIUM: 137 mmol/L (ref 135–145)

## 2018-10-08 LAB — BRAIN NATRIURETIC PEPTIDE: B NATRIURETIC PEPTIDE 5: 708.8 pg/mL — AB (ref 0.0–100.0)

## 2018-10-08 LAB — HEMOGLOBIN A1C
HEMOGLOBIN A1C: 7 % — AB (ref 4.8–5.6)
Mean Plasma Glucose: 154.2 mg/dL

## 2018-10-08 MED ORDER — FUROSEMIDE 40 MG PO TABS: 80.0000 mg | ORAL_TABLET | Freq: Two times a day (BID) | ORAL | 3 refills | Status: DC

## 2018-10-08 NOTE — Progress Notes (Signed)
PCP: Dr. Dimas Aguas Cardiology: Dr. Wynonia Lawman HF Cardiology: Dr. Aundra Dubin  78 y.o.with history of diabetes, HTN, PAD, hyperlipidemia who was referred by Dr. Wynonia Lawman for evaluation of pulmonary hypertension.   For > 1 year, she has had significant exertional dyspnea. It has been steadily worsening, especially over the last few months.  She has been extensively worked up so far.  Echo in 4/18 showed preserved EF 65% with moderate pulmonary hypertension.  She had episodes of syncope in 5/18 and 10/18.  She wore an event monitor in 5/18 with no significant arrhythmia.  LHC/RHC in 1/19 showed normal PCWP and severely elevated PA pressure, no significant CAD.    At initial appointment in 1/19, she was noted to be hypoxemic with exertion and home oxygen was started for use with exertion.  I also started her on Opsumit. She had to stop this after about a week due to significantly increased exertional peripheral edema.  This resolved after Opsumit was stopped.   Echo in 4/19.  This showed EF 60-65%, mild MS, mild AS, moderately dilated RV with mildly decreased systolic function.   She was found to have Fe deficiency anemia, FOBT+.  EGD showed gastritis and c-scope showed diverticulosis and polyps in 4/19.  She get IV Fe.  She is going to have a capsule endoscopy.   She tried to start ambrisentan, but developed worsening dyspnea and peripheral edema and low oxygen saturation.  This improved after ambrisentan was stopped.   She returns for follow up of RV failure and pulm HTN.  Exercise capacity is stable. No dyspnea walking on flat ground. She is going to the [facility] and walking 15 minutes and using elliptical without exertional dyspnea. She does not need home oxygen.  She does report occasional mild chest heaviness with exertion. This does not happen every day.   6 minute walk (1/19): 92 m, oxygen saturation dropped to 70%.   6 minute walk (3/19): 122 m 6 minute walk (4/19): 392 m 6 minute walk (9/19): 317 m 6  minute walk (1/20): 320 m  Labs (1/19): anti-Jo1 negative, RF borderline positive, SCL-70 negative, HIV negative. ESR 21. BNP 1129.  Labs (3/19): ANA + => anti-dsDNA negative, SSA/SSB negative, anti-Sm negative, anti-RNP negative. K 3.8 => 3.7, creatinine 1.4 => 1.55.  Labs (4/19): K 4.1 => 3.1, creatinine 1.58 => 1.2, BNP 1902, CCP Ab negative.  Labs (6/19): hgb 9, creatinine 1.66, K 3 Labs (7/19): K 4.9, creatinine 1.7 Labs (8/19): K 3.6, creatinine 1.36 Labs (9/19): K 4.1, creatinine 1.55, BNP 641 Labs (10/19): K 4.7, creatinine 1.88, BNP 595  PMH: 1. PAD: Non-healing left ankle ulcer.  - ABIs 9/12 were normal.  2. Type 2 diabetes 3. Hypothyroidism 4. CKD stage 3 5. Hyperlipidemia 6. HTN 7. IBS 8. OSA: Mild, on CPAP.  9. Event monitor 5/18: No significant abnormality.  10. DVT in 1995 11. Pulmonary hypertension: Echo (4/18) with EF 65%, mild MR, mild TR, mild mitral stenosis, moderate pulmonary hypertension. - CTA chest 10/17: No PE.  - RHC (1/19): mean RA 11, PA 87/31 mean 51, mean PCWP 6, CI 3.58, PVR 7 WU.  - PFTs (1/19): mild obstruction, some restriction => severely decreased DLCO. - High resolution CT chest (1/19): no interstitial lung disease or emphysema. 7 mm nodule RUL.  - anti-Jo1 negative, RF borderline positive, SCL-70 negative, HIV negative. ANA + => anti-dsDNA negative, SSA/SSB negative, anti-Sm negative, anti-RNP negative. - V/Q scan 2/19: No e/o chronic PE.  - Echo (4/19): EF 60-65%, mild  LVH, mild aortic stenosis, mild mitral stenosis, moderately dilated RV with mildly decreased systolic function, PASP 36 mmHg (poor envelope, may be underestimated).  - Does not tolerate ERAs (macitentan, ambrisentan).  12. Coronary angiography 11/18 without significant disease.  - Coronary angiography in 1/19 with luminal irregularities 13. Mitral stenosis: Mild by cath in 1/19, mean gradient 2.6 mmHg. Mild on 4/19 echo.  14. Aortic stenosis: Mild on 4/19 echo.  15. Fe  deficiency anemia: EGD showed gastritis and c-scope showed diverticulosis and polyps in 4/19. Capsule endoscopy was unrevealing.  16. Pulmonary nodule: CT chest 7/19 with RUL nodule.   SH: Lives in [place], 2 kids, nonsmoker, rare ETOH.   FH: No pulmonary HTN.  Father and brother with MIs  Review of systems complete and found to be negative unless listed in HPI.   Current Outpatient Medications  Medication Sig Dispense Refill  . amLODipine (NORVASC) 5 MG tablet Take 0.5 tablets (2.5 mg total) by mouth daily. 45 tablet 3  . atorvastatin (LIPITOR) 20 MG tablet Take 20 mg by mouth every morning.    . carvedilol (COREG) 6.25 MG tablet TAKE 1 TABLET (6.25 MG TOTAL) BY MOUTH 2 (TWO) TIMES DAILY WITH A MEAL. 60 tablet 2  . cetirizine (ZYRTEC) 10 MG tablet Take 10 mg by mouth daily as needed for allergies.    . fenofibrate 160 MG tablet Take 160 mg by mouth every morning.    . ferrous sulfate 325 (65 FE) MG tablet Take 325 mg by mouth daily with breakfast.    . glimepiride (AMARYL) 4 MG tablet Take 4 mg by mouth daily with breakfast.    . levothyroxine (SYNTHROID, LEVOTHROID) 125 MCG tablet Take 125 mcg by mouth daily before breakfast.    . loperamide (IMODIUM A-D) 2 MG tablet Take 2 mg by mouth 4 (four) times daily as needed for diarrhea or loose stools.    Marland Kitchen loratadine (CLARITIN) 10 MG tablet Take 10 mg by mouth daily.    . metFORMIN (GLUCOPHAGE-XR) 500 MG 24 hr tablet Take 500 mg by mouth daily with breakfast. Pt may take additional dose if needed    . metolazone (ZAROXOLYN) 2.5 MG tablet Take 1 tablet (2.5 mg total) by mouth once a week. Every Saturday 10 tablet 6  . Multiple Vitamins-Minerals (PRESERVISION AREDS 2 PO) Take 1 capsule by mouth 2 (two) times daily.    . pantoprazole (PROTONIX) 40 MG tablet Take 40 mg by mouth every morning.    . potassium chloride (KLOR-CON M10) 10 MEQ tablet Take 2 tabs in AM and 1 tab in PM, take an extra tab on Saturdays 90 tablet 3  . Selexipag  (UPTRAVI) 200 MCG TABS Take 8 tablets (1,600 mcg total) by mouth 2 (two) times daily. 420 tablet 3  . spironolactone (ALDACTONE) 25 MG tablet TAKE 0.5 TABLETS (12.5 MG TOTAL) BY MOUTH DAILY. 45 tablet 1  . tadalafil, PAH, (ADCIRCA) 20 MG tablet Take 2 tablets (40 mg total) by mouth daily. 180 tablet 3  . furosemide (LASIX) 40 MG tablet Take 2 tablets (80 mg total) by mouth 2 (two) times daily. 120 tablet 3   No current facility-administered medications for this encounter.    BP 110/64   Pulse 65   Wt 76.4 kg (168 lb 6 oz)   SpO2 97%   BMI 27.59 kg/m   Wt Readings from Last 3 Encounters:  10/08/18 76.4 kg (168 lb 6 oz)  07/06/18 74.8 kg (164 lb 12.8 oz)  06/11/18 71.7 kg (158  lb)   General: NAD Neck: No JVD, no thyromegaly or thyroid nodule.  Lungs: Clear to auscultation bilaterally with normal respiratory effort. CV: Nondisplaced PMI.  Heart regular S1/S2, no S3/S4, no murmur.  No peripheral edema.  No carotid bruit.  Normal pedal pulses.  Abdomen: Soft, nontender, no hepatosplenomegaly, no distention.  Skin: Intact without lesions or rashes.  Neurologic: Alert and oriented x 3.  Psych: Normal affect. Extremities: No clubbing or cyanosis.  HEENT: Normal.    Assessment/Plan: 1. Pulmonary hypertension:  Severe PAH with PVR 7 WU on 1/19 RHC.  Etiology uncertain.  She has a history of DVT remotely but V/Q scan in 2/19 did not show evidence for chronic PE. She has OSA and uses CPAP.  Serologic workup showed only positive ANA but when additional serologies were done, they were all negative (see above).  CT chest with no ILD or emphysema.  Valvular heart disease does not appear significant enough to cause this degree of PH.  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  Echo in 4/19 showed that the RV is moderately dilated with mildly decreased systolic function, PA systolic pressure estimation was only 36 mmHg but poor envelope and likely underestimated. She did not tolerate Opsumit or ambrisentan due to significant  lower extremity edema and worsening dyspnea.  NYHA class II symptoms.  6 minute walk today is stable.   - Continue Adcirca 40 mg daily.  [name] is up to 1600 mcg BID - With borderline positive RF, CCP ab was checked and was negative.  - BNP today.  - Echo at followup in 3 months.  2. Syncope: Suspect related to severe pulmonary hypertension.  None recently.   3. Mitral stenosis: Noted on echo and cath, mild.  No change.  4. H/o DVT: No chronic PE on V/Q scan.   5. Chronic diastolic CHF/RV failure: In setting of PAH and some degree of cor pulmonale.  NYHA class II symptoms, She is not volume overloaded on exam.    - Continue Lasix 120 qam/80 qpm. Continue KCl 20 qam/10 qpm. BMET today.  - Continue metolazone 2.5 qSaturday.  She will take KCl 40 bid on metolazone days.   - Continue spiro 12.5 mg daily.  6. HTN: BP controlled.  Continue amlodipine.  7. OSA: Continue CPAP qHS.  8. Lung nodule: Arrange for repeat noncontrast CT chest to follow nodules. .  9. CKD: Stage 3.  BMET today.   10. Chest pain: Patient has exertional chest pain on occasion.  This could be related to pulmonary hypertension, but I will arrange for Lexiscan Cardiolite to assess for ischemia.   Followup in 3 months with echo    Loralie Champagne 10/08/2018

## 2018-10-08 NOTE — Progress Notes (Signed)
Pt ambulated 320 meters in 6 minutes  Initial O2 93% HR 60  During walk O2 ranged from 84-93% HR ranged from 60-100bpm  Pt denies any SOB or discomfort

## 2018-10-08 NOTE — Telephone Encounter (Signed)
-----   Message from Larey Dresser, MD sent at 10/08/2018  3:09 PM EST ----- BUN/creatinine are up.  Hold Lasix x 1 day then decrease to 80 mg bid.  Repeat BMET in 7 days.

## 2018-10-08 NOTE — Telephone Encounter (Signed)
Notes recorded by Harvie Junior, CMA on 10/08/2018 at 3:37 PM EST Spoke with pt she is aware and agreeable with plan. Lab appt scheduled. ------  Notes recorded by Larey Dresser, MD on 10/08/2018 at 3:09 PM EST BUN/creatinine are up. Hold Lasix x 1 day then decrease to 80 mg bid. Repeat BMET in 7 days.

## 2018-10-08 NOTE — Patient Instructions (Addendum)
Labs were done today. We will call you with any ABNORMAL results. No news is good news!  You completed a 6 minute walk to evaluate your exercise tolerance.   You will get a chest CT to follow up on the lung nodule.  You have been scheduled for a stress test Feb 3rd @10 :00am, please arrive at 09:45  Your physician has requested that you have an echocardiogram. Echocardiography is a painless test that uses sound waves to create images of your heart. It provides your doctor with information about the size and shape of your heart and how well your heart's chambers and valves are working. This procedure takes approximately one hour. There are no restrictions for this procedure.  Your physician recommends that you schedule a follow-up appointment in 3 months, your ECHO will be scheduled the same day.

## 2018-10-09 ENCOUNTER — Telehealth (HOSPITAL_COMMUNITY): Payer: Self-pay | Admitting: *Deleted

## 2018-10-09 ENCOUNTER — Encounter (HOSPITAL_COMMUNITY): Payer: Self-pay

## 2018-10-09 ENCOUNTER — Telehealth (HOSPITAL_COMMUNITY): Payer: Self-pay | Admitting: Vascular Surgery

## 2018-10-09 ENCOUNTER — Telehealth (HOSPITAL_COMMUNITY): Payer: Self-pay

## 2018-10-09 NOTE — Telephone Encounter (Signed)
Results from CBC/A1C pt requested to be drawn in our office have been faxed to PCP per pt request.

## 2018-10-09 NOTE — Telephone Encounter (Signed)
Left pt message giving CT appt 10/16/18 @ 3:30, asked pt to call back to confirm appt

## 2018-10-09 NOTE — Telephone Encounter (Signed)
Left message on voicemail in reference to upcoming appointment scheduled for 10/15/18. Phone number given for a call back so details instructions can be given. Rachael Ferrie, Ranae Palms

## 2018-10-15 ENCOUNTER — Ambulatory Visit (HOSPITAL_COMMUNITY)
Admission: RE | Admit: 2018-10-15 | Discharge: 2018-10-15 | Disposition: A | Discharge status: Home / Self Care | Payer: Medicare Other | Source: Ambulatory Visit | Attending: Internal Medicine | Admitting: Internal Medicine

## 2018-10-15 ENCOUNTER — Ambulatory Visit (HOSPITAL_BASED_OUTPATIENT_CLINIC_OR_DEPARTMENT_OTHER): Payer: Medicare Other

## 2018-10-15 VITALS — Ht 65.5 in | Wt 168.0 lb

## 2018-10-15 DIAGNOSIS — I10 Essential (primary) hypertension: Secondary | ICD-10-CM | POA: Diagnosis not present

## 2018-10-15 DIAGNOSIS — R079 Chest pain, unspecified: Secondary | ICD-10-CM | POA: Diagnosis not present

## 2018-10-15 DIAGNOSIS — E119 Type 2 diabetes mellitus without complications: Secondary | ICD-10-CM | POA: Diagnosis not present

## 2018-10-15 DIAGNOSIS — R110 Nausea: Secondary | ICD-10-CM

## 2018-10-15 DIAGNOSIS — I5022 Chronic systolic (congestive) heart failure: Secondary | ICD-10-CM

## 2018-10-15 LAB — BASIC METABOLIC PANEL
Anion gap: 12 (ref 5–15)
BUN: 38 mg/dL — ABNORMAL HIGH (ref 8–23)
CHLORIDE: 104 mmol/L (ref 98–111)
CO2: 22 mmol/L (ref 22–32)
Calcium: 8.8 mg/dL — ABNORMAL LOW (ref 8.9–10.3)
Creatinine, Ser: 1.92 mg/dL — ABNORMAL HIGH (ref 0.44–1.00)
GFR calc Af Amer: 29 mL/min — ABNORMAL LOW (ref >60)
GFR calc non Af Amer: 25 mL/min — ABNORMAL LOW (ref >60)
GLUCOSE: 217 mg/dL — AB (ref 70–99)
POTASSIUM: 3.7 mmol/L (ref 3.5–5.1)
Sodium: 138 mmol/L (ref 135–145)

## 2018-10-15 LAB — MYOCARDIAL PERFUSION IMAGING
CHL CUP NUCLEAR SRS: 4
CHL CUP NUCLEAR SSS: 8
CSEPPHR: 77 {beats}/min
LVDIAVOL: 51 mL (ref 46–106)
LVSYSVOL: 6 mL
Rest HR: 64 {beats}/min
SDS: 4
TID: 0.96

## 2018-10-15 MED ORDER — REGADENOSON 0.4 MG/5ML IV SOLN
0.4000 mg | Freq: Once | INTRAVENOUS | Status: AC
  Administered 2018-10-15: 0.4 mg via INTRAVENOUS

## 2018-10-15 MED ORDER — TECHNETIUM TC 99M TETROFOSMIN IV KIT
10.2000 | PACK | Freq: Once | INTRAVENOUS | Status: AC | PRN
  Administered 2018-10-15: 10.2 via INTRAVENOUS
  Filled 2018-10-15: qty 11

## 2018-10-15 MED ORDER — AMINOPHYLLINE 25 MG/ML IV SOLN
75.0000 mg | Freq: Once | INTRAVENOUS | Status: AC
  Administered 2018-10-15: 75 mg via INTRAVENOUS

## 2018-10-15 MED ORDER — TECHNETIUM TC 99M TETROFOSMIN IV KIT
32.4000 | PACK | Freq: Once | INTRAVENOUS | Status: AC | PRN
  Administered 2018-10-15: 32.4 via INTRAVENOUS
  Filled 2018-10-15: qty 33

## 2018-10-16 ENCOUNTER — Ambulatory Visit (HOSPITAL_COMMUNITY): Payer: Medicare Other

## 2018-10-25 ENCOUNTER — Ambulatory Visit (HOSPITAL_COMMUNITY)
Admission: RE | Admit: 2018-10-25 | Discharge: 2018-10-25 | Disposition: A | Discharge status: Home / Self Care | Payer: Medicare Other | Source: Ambulatory Visit | Attending: Cardiology | Admitting: Cardiology

## 2018-10-25 DIAGNOSIS — I2510 Atherosclerotic heart disease of native coronary artery without angina pectoris: Secondary | ICD-10-CM

## 2018-10-25 DIAGNOSIS — R911 Solitary pulmonary nodule: Secondary | ICD-10-CM | POA: Insufficient documentation

## 2018-10-25 DIAGNOSIS — K8020 Calculus of gallbladder without cholecystitis without obstruction: Secondary | ICD-10-CM | POA: Insufficient documentation

## 2018-10-25 DIAGNOSIS — I313 Pericardial effusion (noninflammatory): Secondary | ICD-10-CM | POA: Insufficient documentation

## 2018-10-25 DIAGNOSIS — I517 Cardiomegaly: Secondary | ICD-10-CM | POA: Diagnosis not present

## 2018-10-25 DIAGNOSIS — R933 Abnormal findings on diagnostic imaging of other parts of digestive tract: Secondary | ICD-10-CM | POA: Insufficient documentation

## 2018-11-22 ENCOUNTER — Telehealth: Payer: Self-pay | Admitting: *Deleted

## 2018-11-22 NOTE — Telephone Encounter (Signed)
Prior authorization for TADALAFIL approved through [employer].

## 2018-11-23 NOTE — Telephone Encounter (Signed)
Prior authorization through [employer] company was APPROVED for tadalafil and will expire on 09/12/2019.

## 2018-11-27 ENCOUNTER — Other Ambulatory Visit (HOSPITAL_COMMUNITY): Payer: Self-pay | Admitting: Cardiology

## 2018-12-20 ENCOUNTER — Telehealth (HOSPITAL_COMMUNITY): Payer: Self-pay | Admitting: Cardiology

## 2018-12-20 NOTE — Telephone Encounter (Signed)
Does she have cough, fever, chills, or recent contact with anybody sick? Does she have oxygen at home? I am concerned she may have COVID with her symptoms and if so, she needs to go to the ED to get assessed further. Spoke to Dr Aundra Dubin as well.

## 2018-12-20 NOTE — Telephone Encounter (Signed)
Patient called with concerns regarding increased HR and SOB Reports weight is stable @ 163lbs. Reports breathing has become more labored at rest after spending sometime on her patio (allergies?). Increased oxygen use with sats 82-83 % on RA Patient has increased weekly metolazone use to twice weekly over the past two weeks.    Please advise further

## 2018-12-20 NOTE — Telephone Encounter (Signed)
Returned call to patient, denies cough, fever, chills or recent contact to anyone sick. Reports she has only been out to the grocery store two weeks ago and during that time she wore mask and gloves.  Reports her o2 sats recover into the 90's with the use of home oxygen at 2L.   Declines reporting to ER for COVID rule out, will continue to use antihistamines for allergies and decrease time outside. would like to follow up with virtual visit to further discuss SOB 12/25/18 @ 2

## 2018-12-22 ENCOUNTER — Other Ambulatory Visit (HOSPITAL_COMMUNITY): Payer: Self-pay | Admitting: Cardiology

## 2018-12-23 ENCOUNTER — Other Ambulatory Visit (HOSPITAL_COMMUNITY): Payer: Self-pay | Admitting: Cardiology

## 2018-12-25 ENCOUNTER — Other Ambulatory Visit: Payer: Self-pay

## 2018-12-25 ENCOUNTER — Ambulatory Visit (HOSPITAL_COMMUNITY)
Admission: RE | Admit: 2018-12-25 | Discharge: 2018-12-25 | Disposition: A | Discharge status: Home / Self Care | Payer: Medicare Other | Source: Ambulatory Visit | Attending: Cardiology | Admitting: Cardiology

## 2018-12-25 ENCOUNTER — Encounter (HOSPITAL_COMMUNITY): Payer: Self-pay

## 2018-12-25 DIAGNOSIS — I5189 Other ill-defined heart diseases: Secondary | ICD-10-CM

## 2018-12-25 DIAGNOSIS — N183 Chronic kidney disease, stage 3 unspecified: Secondary | ICD-10-CM

## 2018-12-25 DIAGNOSIS — R079 Chest pain, unspecified: Secondary | ICD-10-CM

## 2018-12-25 DIAGNOSIS — R55 Syncope and collapse: Secondary | ICD-10-CM

## 2018-12-25 DIAGNOSIS — G4733 Obstructive sleep apnea (adult) (pediatric): Secondary | ICD-10-CM

## 2018-12-25 DIAGNOSIS — I272 Pulmonary hypertension, unspecified: Secondary | ICD-10-CM | POA: Diagnosis not present

## 2018-12-25 DIAGNOSIS — I5032 Chronic diastolic (congestive) heart failure: Secondary | ICD-10-CM

## 2018-12-25 NOTE — Progress Notes (Addendum)
Heart Failure TeleHealth Note  Due to national recommendations of social distancing due to [place] 19, Audio/video telehealth visit is felt to be most appropriate for this patient at this time.  See MyChart message from today for patient consent regarding telehealth for Alisha [name].  Date:  12/25/2018   ID:  Alisha [name], DOB 1941-05-14, MRN [id number]  Location: Home  Provider location: [facility] Type of Visit: Established patient, Add on sick visit  PCP:  Neila Gear, MD  Cardiologist: Dr Aundra Dubin  Chief Complaint: SOB   History of Present Illness: Alisha [name] is a 78 y.o. female with a history of diabetes, HTN, PAD, hyperlipidemia, severe PAH, hx of remote DVT, syncope, mild mitral stenosis, chronic diastolic HF with RV failure due to [place], HTN, OSA on CPAP, lung nodule, and CKD 3.   She called clinic last week with increased O2 requirement and increased SOB after sitting outside on her patio. She had increased metolazone to twice weekly x 2 weeks with no improvement. Weights were stable.   She presents via Engineer, civil (consulting) for a telehealth visit today. Overall doing fair. She has several concerns today. She is very fatigued, worse over the last month. She has some chest tightness over the last month and increased SOB that has been worse over the last few days. Chest tightness has been unchanged and is constant. She accumulates a little edema throughout the day in her ankles, but ankles are skinny when she wakes up in the morning. Sleeps at incline for GERD. Does not wear CPAP. CPAP machine taken away a few months ago due to her not using it. She had an episode of syncope after pulling suitcase up a hill in February. She had LOC for a few seconds. She was evaluated by medics and declined to go to hospital. No further syncope since then. She does not notice an increase in UOP with metolazone. She thinks some of her symptoms may be related to  allergies. No cough, fever, or chills. No sick contacts. Oxygen levels are staying in the mid 90s now on RA. Apple watch HR is 65-70. Weight stable around 162 lbs.  She denies symptoms worrisome for COVID 19.   Past Medical History:  Diagnosis Date  . Aortic atherosclerosis ([place]) 08/30/2017  . Aortic stenosis 08/30/2017  . CAD (coronary artery disease), native coronary artery 08/30/2017  . Chronic diastolic heart failure ([place]) 08/30/2017  . Diabetes mellitus with peripheral vascular disease ([place]) 07/14/2014  . Hypertensive heart disease without CHF 08/30/2017  . Hypothyroidism (acquired) 08/30/2017  . Pulmonary hypertension ([place]) 07/04/2017   Past Surgical History:  Procedure Laterality Date  . ANKLE SURGERY    . APPENDECTOMY    . DILATION AND CURETTAGE OF UTERUS    . RIGHT/LEFT HEART CATH AND CORONARY ANGIOGRAPHY N/A 09/27/2017   Procedure: RIGHT/LEFT HEART CATH AND CORONARY ANGIOGRAPHY;  Surgeon: Sherren Mocha, MD;  Location: [facility];  Service: Cardiovascular;  Laterality: N/A;     Current Outpatient Medications  Medication Sig Dispense Refill  . amLODipine (NORVASC) 5 MG tablet Take 0.5 tablets (2.5 mg total) by mouth daily. 45 tablet 3  . atorvastatin (LIPITOR) 20 MG tablet Take 20 mg by mouth every morning.    . carvedilol (COREG) 6.25 MG tablet TAKE 1 TABLET (6.25 MG TOTAL) BY MOUTH 2 (TWO) TIMES DAILY WITH A MEAL. 60 tablet 2  . furosemide (LASIX) 40 MG tablet TAKE 3 TABLETS EVERY MORNING AND 2 TABLETS  EVERY EVENING 450 tablet 1  . metolazone (ZAROXOLYN) 2.5 MG tablet Take 1 tablet (2.5 mg total) by mouth once a week. Every Saturday 10 tablet 6  . potassium chloride (KLOR-CON M10) 10 MEQ tablet Take 2 tabs in AM and 1 tab in PM, take an extra tab on Saturdays 90 tablet 3  . Selexipag (UPTRAVI) 200 MCG TABS Take 8 tablets (1,600 mcg total) by mouth 2 (two) times daily. 420 tablet 3  . spironolactone (ALDACTONE) 25 MG tablet TAKE 1/2 TABLET BY MOUTH EVERY DAY 45  tablet 1  . tadalafil, PAH, (ADCIRCA) 20 MG tablet Take 2 tablets (40 mg total) by mouth daily. 180 tablet 3  . cetirizine (ZYRTEC) 10 MG tablet Take 10 mg by mouth daily as needed for allergies.    . fenofibrate 160 MG tablet Take 160 mg by mouth every morning.    . ferrous sulfate 325 (65 FE) MG tablet Take 325 mg by mouth daily with breakfast.    . glimepiride (AMARYL) 4 MG tablet Take 4 mg by mouth daily with breakfast.    . levothyroxine (SYNTHROID, LEVOTHROID) 125 MCG tablet Take 125 mcg by mouth daily before breakfast.    . loperamide (IMODIUM A-D) 2 MG tablet Take 2 mg by mouth 4 (four) times daily as needed for diarrhea or loose stools.    Marland Kitchen loratadine (CLARITIN) 10 MG tablet Take 10 mg by mouth daily.    . metFORMIN (GLUCOPHAGE-XR) 500 MG 24 hr tablet Take 500 mg by mouth daily with breakfast. Pt may take additional dose if needed    . Multiple Vitamins-Minerals (PRESERVISION AREDS 2 PO) Take 1 capsule by mouth 2 (two) times daily.    . pantoprazole (PROTONIX) 40 MG tablet Take 40 mg by mouth every morning.     No current facility-administered medications for this encounter.     Allergies:   Other and Adhesive [tape]   Social History:  The patient  reports that she has never smoked. She has never used smokeless tobacco. She reports that she does not drink alcohol or use drugs.   Family History:  The patient's family history is not on file.   ROS:  Please see the history of present illness.   All other systems are personally reviewed and negative.   Exam:  (Video/Tele Health Call; Exam is subjective and or/visual.) General:  Speaks in full sentences. No resp difficulty. Lungs: Normal respiratory effort with conversation.  Abdomen: Non-distended per patient report Extremities: Pt denies edema. Neuro: Alert & oriented x 3.   Recent Labs: 10/08/2018: B Natriuretic Peptide 708.8; Hemoglobin 12.2; Platelets 355 10/15/2018: BUN 38; Creatinine, Ser 1.92; Potassium 3.7; Sodium 138   Personally reviewed   Wt Readings from Last 3 Encounters:  10/15/18 76.2 kg (168 lb)  10/08/18 76.4 kg (168 lb 6 oz)  07/06/18 74.8 kg (164 lb 12.8 oz)      ASSESSMENT AND PLAN:  1. Pulmonary hypertension:  Severe PAH with PVR 7 WU on 1/19 RHC.  Etiology uncertain.  She has a history of DVT remotely but V/Q scan in 2/19 did not show evidence for chronic PE. She has OSA and uses CPAP.  Serologic workup showed only positive ANA but when additional serologies were done, they were all negative (see above).  CT chest with no ILD or emphysema.  Valvular heart disease does not appear significant enough to cause this degree of PH.  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  Echo in 4/19 showed that the RV is moderately dilated  with mildly decreased systolic function, PA systolic pressure estimation was only 36 mmHg but poor envelope and likely underestimated. She did not tolerate Opsumit or ambrisentan due to significant lower extremity edema and worsening dyspnea.   - Worse NYHA class III symptoms.  Volume sounds okay, but difficult to assess. - Continue Adcirca 40 mg daily.  - Continue Uptravi 1600 mcg BID - With borderline positive RF, CCP ab was checked and was negative.  - Repeat echo when safely able to do so.   - Wears O2 PRN - She has been off CPAP for a few months, but says she didn't wear it much prior to then. Will have her follow up with her sleep doctor at [name].   2. Chronic diastolic CHF/RV failure: In setting of PAH and some degree of cor pulmonale.  Worse NYHA class III symptoms, Volume sounds okay - Continue Lasix 120 qam/80 qpm. Continue KCl 20 qam/10 qpm.  - Continue metolazone 2.5 q Saturday.  She will take KCl 40 bid on metolazone days.   - Continue spiro 12.5 mg daily.  - Stop coreg with RV failure and worsening fatigue. Will not add dig with CKD3.   3 OSA: Now off CPAP. Was not wearing much before. Will have her follow up with her sleep doctor at [name] to see if we can find a CPAP that works  well for her. We discussed the importance of CPAP today.   4. CKD: Stage 3. - Creatinine 1.92 on 2/3. Stable.   5. Chest pain: This could be related to pulmonary hypertension. Lexiscan Cardiolite 10/2018 showed no significant perfusion defect, low risk study - Continues to have constant chest tightness. Will discuss with Dr Aundra Dubin.   6. Syncope -  Had another episode of syncope in February after exerting herself. She was walking up a hill and pulling a suitcase, felt fatigued, and then loss consciousness for a few seconds. She was evaluated by medics and declined going to the hospital. Has not had any since.   7. Lung nodule: Repeat noncontrast CT chest showed 9 mm nodule in RUL (8 mm on previous). Recommended 1 year follow up.   COVID screen The patient does not have any symptoms that suggest any further testing/ screening at this time.  Social distancing reinforced today. Her kids are bringing her groceries.   Relevant cardiac medications were reviewed at length with the patient today.  The patient does not have concerns regarding their medications at this time.   The following changes were made today:  DC coreg. She needs to follow up with [facility] in [name] for CPAP. Set up for [place] on Tuesday afternoon with Dr Aundra Dubin.   Recommended follow-up:  Keep follow up with Dr Aundra Dubin in 3 weeks. Change to virtual visit. If he is unable to see her, okay to put on APP side.   Discussed the patient's symptoms (syncope, CP, and SOB) with Dr Aundra Dubin this afternoon. We are concerned that her PAH is worsening. Symptoms don't seem to be consistent with volume overload. Will plan to do RHC early next week to reassess pulmonary pressures. I spoke to Ms Oneil and she is agreeable.   Today, I have spent 27 minutes with the patient with telehealth technology discussing the above issues .    Signed, Georgiana Shore, NP  12/25/2018 2:15 PM  Alisha [name] [address] [facility] and [place] [address] [phone] (office) [phone] (fax)

## 2018-12-25 NOTE — Progress Notes (Signed)
Spoke to pt. Reviewed after visit summary instructions. Also reviewed pre cath instructions. Cath orders placed. Send message as well via mychart. Denies need for refills

## 2018-12-25 NOTE — H&P (View-Only) (Signed)
Heart Failure TeleHealth Note  Due to national recommendations of social distancing due to [place] 19, Audio/video telehealth visit is felt to be most appropriate for this patient at this time.  See MyChart message from today for patient consent regarding telehealth for [facility].  Date:  12/25/2018   ID:  Alisha [name], DOB 05/17/1941, MRN [id number]  Location: Home  Provider location: [facility] Type of Visit: Established patient, Add on sick visit  PCP:  Neila Gear, MD  Cardiologist: Dr Aundra Dubin  Chief Complaint: SOB   History of Present Illness: Alisha [name] is a 78 y.o. female with a history of diabetes, HTN, PAD, hyperlipidemia, severe PAH, hx of remote DVT, syncope, mild mitral stenosis, chronic diastolic HF with RV failure due to [place], HTN, OSA on CPAP, lung nodule, and CKD 3.   She called clinic last week with increased O2 requirement and increased SOB after sitting outside on her patio. She had increased metolazone to twice weekly x 2 weeks with no improvement. Weights were stable.   She presents via Engineer, civil (consulting) for a telehealth visit today. Overall doing fair. She has several concerns today. She is very fatigued, worse over the last month. She has some chest tightness over the last month and increased SOB that has been worse over the last few days. Chest tightness has been unchanged and is constant. She accumulates a little edema throughout the day in her ankles, but ankles are skinny when she wakes up in the morning. Sleeps at incline for GERD. Does not wear CPAP. CPAP machine taken away a few months ago due to her not using it. She had an episode of syncope after pulling suitcase up a hill in February. She had LOC for a few seconds. She was evaluated by medics and declined to go to hospital. No further syncope since then. She does not notice an increase in UOP with metolazone. She thinks some of her symptoms may be related to  allergies. No cough, fever, or chills. No sick contacts. Oxygen levels are staying in the mid 90s now on RA. Apple watch HR is 65-70. Weight stable around 162 lbs.  She denies symptoms worrisome for COVID 19.   Past Medical History:  Diagnosis Date  . Aortic atherosclerosis ([place]) 08/30/2017  . Aortic stenosis 08/30/2017  . CAD (coronary artery disease), native coronary artery 08/30/2017  . Chronic diastolic heart failure ([place]) 08/30/2017  . Diabetes mellitus with peripheral vascular disease ([place]) 07/14/2014  . Hypertensive heart disease without CHF 08/30/2017  . Hypothyroidism (acquired) 08/30/2017  . Pulmonary hypertension ([place]) 07/04/2017   Past Surgical History:  Procedure Laterality Date  . ANKLE SURGERY    . APPENDECTOMY    . DILATION AND CURETTAGE OF UTERUS    . RIGHT/LEFT HEART CATH AND CORONARY ANGIOGRAPHY N/A 09/27/2017   Procedure: RIGHT/LEFT HEART CATH AND CORONARY ANGIOGRAPHY;  Surgeon: Sherren Mocha, MD;  Location: [facility];  Service: Cardiovascular;  Laterality: N/A;     Current Outpatient Medications  Medication Sig Dispense Refill  . amLODipine (NORVASC) 5 MG tablet Take 0.5 tablets (2.5 mg total) by mouth daily. 45 tablet 3  . atorvastatin (LIPITOR) 20 MG tablet Take 20 mg by mouth every morning.    . carvedilol (COREG) 6.25 MG tablet TAKE 1 TABLET (6.25 MG TOTAL) BY MOUTH 2 (TWO) TIMES DAILY WITH A MEAL. 60 tablet 2  . furosemide (LASIX) 40 MG tablet TAKE 3 TABLETS EVERY MORNING AND 2 TABLETS  EVERY EVENING 450 tablet 1  . metolazone (ZAROXOLYN) 2.5 MG tablet Take 1 tablet (2.5 mg total) by mouth once a week. Every Saturday 10 tablet 6  . potassium chloride (KLOR-CON M10) 10 MEQ tablet Take 2 tabs in AM and 1 tab in PM, take an extra tab on Saturdays 90 tablet 3  . Selexipag (UPTRAVI) 200 MCG TABS Take 8 tablets (1,600 mcg total) by mouth 2 (two) times daily. 420 tablet 3  . spironolactone (ALDACTONE) 25 MG tablet TAKE 1/2 TABLET BY MOUTH EVERY DAY 45  tablet 1  . tadalafil, PAH, (ADCIRCA) 20 MG tablet Take 2 tablets (40 mg total) by mouth daily. 180 tablet 3  . cetirizine (ZYRTEC) 10 MG tablet Take 10 mg by mouth daily as needed for allergies.    . fenofibrate 160 MG tablet Take 160 mg by mouth every morning.    . ferrous sulfate 325 (65 FE) MG tablet Take 325 mg by mouth daily with breakfast.    . glimepiride (AMARYL) 4 MG tablet Take 4 mg by mouth daily with breakfast.    . levothyroxine (SYNTHROID, LEVOTHROID) 125 MCG tablet Take 125 mcg by mouth daily before breakfast.    . loperamide (IMODIUM A-D) 2 MG tablet Take 2 mg by mouth 4 (four) times daily as needed for diarrhea or loose stools.    Marland Kitchen loratadine (CLARITIN) 10 MG tablet Take 10 mg by mouth daily.    . metFORMIN (GLUCOPHAGE-XR) 500 MG 24 hr tablet Take 500 mg by mouth daily with breakfast. Pt may take additional dose if needed    . Multiple Vitamins-Minerals (PRESERVISION AREDS 2 PO) Take 1 capsule by mouth 2 (two) times daily.    . pantoprazole (PROTONIX) 40 MG tablet Take 40 mg by mouth every morning.     No current facility-administered medications for this encounter.     Allergies:   Other and Adhesive [tape]   Social History:  The patient  reports that she has never smoked. She has never used smokeless tobacco. She reports that she does not drink alcohol or use drugs.   Family History:  The patient's family history is not on file.   ROS:  Please see the history of present illness.   All other systems are personally reviewed and negative.   Exam:  (Video/Tele Health Call; Exam is subjective and or/visual.) General:  Speaks in full sentences. No resp difficulty. Lungs: Normal respiratory effort with conversation.  Abdomen: Non-distended per patient report Extremities: Pt denies edema. Neuro: Alert & oriented x 3.   Recent Labs: 10/08/2018: B Natriuretic Peptide 708.8; Hemoglobin 12.2; Platelets 355 10/15/2018: BUN 38; Creatinine, Ser 1.92; Potassium 3.7; Sodium 138   Personally reviewed   Wt Readings from Last 3 Encounters:  10/15/18 76.2 kg (168 lb)  10/08/18 76.4 kg (168 lb 6 oz)  07/06/18 74.8 kg (164 lb 12.8 oz)      ASSESSMENT AND PLAN:  1. Pulmonary hypertension:  Severe PAH with PVR 7 WU on 1/19 RHC.  Etiology uncertain.  She has a history of DVT remotely but V/Q scan in 2/19 did not show evidence for chronic PE. She has OSA and uses CPAP.  Serologic workup showed only positive ANA but when additional serologies were done, they were all negative (see above).  CT chest with no ILD or emphysema.  Valvular heart disease does not appear significant enough to cause this degree of PH.  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  Echo in 4/19 showed that the RV is moderately dilated  with mildly decreased systolic function, PA systolic pressure estimation was only 36 mmHg but poor envelope and likely underestimated. She did not tolerate Opsumit or ambrisentan due to significant lower extremity edema and worsening dyspnea.   - Worse NYHA class III symptoms.  Volume sounds okay, but difficult to assess. - Continue Adcirca 40 mg daily.  - Continue Uptravi 1600 mcg BID - With borderline positive RF, CCP ab was checked and was negative.  - Repeat echo when safely able to do so.   - Wears O2 PRN - She has been off CPAP for a few months, but says she didn't wear it much prior to then. Will have her follow up with her sleep doctor at [facility].   2. Chronic diastolic CHF/RV failure: In setting of PAH and some degree of cor pulmonale.  Worse NYHA class III symptoms, Volume sounds okay - Continue Lasix 120 qam/80 qpm. Continue KCl 20 qam/10 qpm.  - Continue metolazone 2.5 q Saturday.  She will take KCl 40 bid on metolazone days.   - Continue spiro 12.5 mg daily.  - Stop coreg with RV failure and worsening fatigue. Will not add dig with CKD3.   3 OSA: Now off CPAP. Was not wearing much before. Will have her follow up with her sleep doctor at [facility] to see if we can find a CPAP that works  well for her. We discussed the importance of CPAP today.   4. CKD: Stage 3. - Creatinine 1.92 on 2/3. Stable.   5. Chest pain: This could be related to pulmonary hypertension. Lexiscan Cardiolite 10/2018 showed no significant perfusion defect, low risk study - Continues to have constant chest tightness. Will discuss with Dr Aundra Dubin.   6. Syncope -  Had another episode of syncope in February after exerting herself. She was walking up a hill and pulling a suitcase, felt fatigued, and then loss consciousness for a few seconds. She was evaluated by medics and declined going to the hospital. Has not had any since.   7. Lung nodule: Repeat noncontrast CT chest showed 9 mm nodule in RUL (8 mm on previous). Recommended 1 year follow up.   COVID screen The patient does not have any symptoms that suggest any further testing/ screening at this time.  Social distancing reinforced today. Her kids are bringing her groceries.   Relevant cardiac medications were reviewed at length with the patient today.  The patient does not have concerns regarding their medications at this time.   The following changes were made today:  DC coreg. She needs to follow up with [facility] in [facility] for CPAP. Set up for [place] on Tuesday afternoon with Dr Aundra Dubin.   Recommended follow-up:  Keep follow up with Dr Aundra Dubin in 3 weeks. Change to virtual visit. If he is unable to see her, okay to put on APP side.   Discussed the patient's symptoms (syncope, CP, and SOB) with Dr Aundra Dubin this afternoon. We are concerned that her PAH is worsening. Symptoms don't seem to be consistent with volume overload. Will plan to do RHC early next week to reassess pulmonary pressures. I spoke to Ms Pascual and she is agreeable.   Today, I have spent 27 minutes with the patient with telehealth technology discussing the above issues .    Signed, Georgiana Shore, NP  12/25/2018 2:15 PM  [place] [address] Heart and [place] [address] [phone] (office) [phone] (fax)

## 2018-12-25 NOTE — Patient Instructions (Addendum)
STOP Carvedilol  Please keep follow up appointment May 4th at 11:00am. This will be a telehealth visit.  Please follow up with [facility] in [facility] for a CPAP.     [facility] [place] [id number] [place] [address] Dept: [phone] Loc: [phone]  IRMGARD RAMPERSAUD  12/25/2018  You are scheduled for a Cardiac Catheterization on Tuesday, April 21 with Dr. Loralie Champagne.  1. Please arrive at the [facility] (Main Entrance A) at [facility]: [address] [place], [place] [address] at 10:00 AM (This time is two hours before your procedure to ensure your preparation). Free valet parking service is available.   Special note: Every effort is made to have your procedure done on time. Please understand that emergencies sometimes delay scheduled procedures.  2. Diet: Do not eat solid foods after midnight.  The patient may have clear liquids until 5am upon the day of the procedure.  3. Medication instructions in preparation for your procedure: DO NO TAKE THE MORNING OF YOUR PROCEDURE: Glimepiride, Metformin, Furosemide, Spironolactone.    On the morning of your procedure, you may take any morning medicines NOT listed above.  You may use sips of water.  4. Plan for one night stay--bring personal belongings. 5. Bring a current list of your medications and current insurance cards. 6. You MUST have a responsible person to drive you home. 7. Someone MUST be with you the first 24 hours after you arrive home or your discharge will be delayed. 8. Please wear clothes that are easy to get on and off and wear slip-on shoes.  Thank you for allowing [place] to care for you!   -- [facility]

## 2018-12-25 NOTE — Addendum Note (Signed)
Encounter addended by: Marlise Eves, RN on: 12/25/2018 3:44 PM  Actions taken: Clinical Note Signed, Order list changed, Diagnosis association updated

## 2018-12-25 NOTE — Addendum Note (Signed)
Encounter addended by: Marlise Eves, RN on: 12/25/2018 3:22 PM  Actions taken: Order list changed, Medication long-term status modified, Clinical Note Signed

## 2019-01-01 ENCOUNTER — Other Ambulatory Visit: Payer: Self-pay

## 2019-01-01 ENCOUNTER — Ambulatory Visit (HOSPITAL_BASED_OUTPATIENT_CLINIC_OR_DEPARTMENT_OTHER): Payer: Medicare Other

## 2019-01-01 ENCOUNTER — Ambulatory Visit (HOSPITAL_COMMUNITY)
Admission: RE | Admit: 2019-01-01 | Discharge: 2019-01-01 | Disposition: A | Discharge status: Home / Self Care | Payer: Medicare Other | Attending: Cardiology | Admitting: Cardiology

## 2019-01-01 ENCOUNTER — Encounter (HOSPITAL_COMMUNITY)
Admission: RE | Disposition: A | Discharge status: Home / Self Care | Payer: Self-pay | Source: Home / Self Care | Attending: Cardiology

## 2019-01-01 ENCOUNTER — Encounter (HOSPITAL_COMMUNITY): Payer: Self-pay

## 2019-01-01 DIAGNOSIS — E039 Hypothyroidism, unspecified: Secondary | ICD-10-CM | POA: Diagnosis not present

## 2019-01-01 DIAGNOSIS — E785 Hyperlipidemia, unspecified: Secondary | ICD-10-CM | POA: Diagnosis not present

## 2019-01-01 DIAGNOSIS — K219 Gastro-esophageal reflux disease without esophagitis: Secondary | ICD-10-CM | POA: Diagnosis not present

## 2019-01-01 DIAGNOSIS — I5032 Chronic diastolic (congestive) heart failure: Secondary | ICD-10-CM | POA: Insufficient documentation

## 2019-01-01 DIAGNOSIS — D649 Anemia, unspecified: Secondary | ICD-10-CM | POA: Diagnosis not present

## 2019-01-01 DIAGNOSIS — N183 Chronic kidney disease, stage 3 (moderate): Secondary | ICD-10-CM | POA: Diagnosis not present

## 2019-01-01 DIAGNOSIS — I2721 Secondary pulmonary arterial hypertension: Secondary | ICD-10-CM | POA: Insufficient documentation

## 2019-01-01 DIAGNOSIS — Z86718 Personal history of other venous thrombosis and embolism: Secondary | ICD-10-CM | POA: Diagnosis not present

## 2019-01-01 DIAGNOSIS — E1151 Type 2 diabetes mellitus with diabetic peripheral angiopathy without gangrene: Secondary | ICD-10-CM | POA: Insufficient documentation

## 2019-01-01 DIAGNOSIS — R0789 Other chest pain: Secondary | ICD-10-CM | POA: Insufficient documentation

## 2019-01-01 DIAGNOSIS — I313 Pericardial effusion (noninflammatory): Secondary | ICD-10-CM | POA: Insufficient documentation

## 2019-01-01 DIAGNOSIS — I080 Rheumatic disorders of both mitral and aortic valves: Secondary | ICD-10-CM | POA: Insufficient documentation

## 2019-01-01 DIAGNOSIS — Z888 Allergy status to other drugs, medicaments and biological substances status: Secondary | ICD-10-CM | POA: Insufficient documentation

## 2019-01-01 DIAGNOSIS — E1122 Type 2 diabetes mellitus with diabetic chronic kidney disease: Secondary | ICD-10-CM | POA: Diagnosis not present

## 2019-01-01 DIAGNOSIS — I2510 Atherosclerotic heart disease of native coronary artery without angina pectoris: Secondary | ICD-10-CM | POA: Insufficient documentation

## 2019-01-01 DIAGNOSIS — I130 Hypertensive heart and chronic kidney disease with heart failure and stage 1 through stage 4 chronic kidney disease, or unspecified chronic kidney disease: Secondary | ICD-10-CM | POA: Insufficient documentation

## 2019-01-01 DIAGNOSIS — G4733 Obstructive sleep apnea (adult) (pediatric): Secondary | ICD-10-CM | POA: Diagnosis not present

## 2019-01-01 DIAGNOSIS — I272 Pulmonary hypertension, unspecified: Secondary | ICD-10-CM | POA: Diagnosis not present

## 2019-01-01 DIAGNOSIS — Z79899 Other long term (current) drug therapy: Secondary | ICD-10-CM | POA: Insufficient documentation

## 2019-01-01 DIAGNOSIS — Z7984 Long term (current) use of oral hypoglycemic drugs: Secondary | ICD-10-CM | POA: Diagnosis not present

## 2019-01-01 HISTORY — PX: RIGHT HEART CATH: CATH118263

## 2019-01-01 LAB — ECHOCARDIOGRAM COMPLETE
Height: 65.5 in
Weight: 2592 oz

## 2019-01-01 LAB — GLUCOSE, CAPILLARY: Glucose-Capillary: 149 mg/dL — ABNORMAL HIGH (ref 70–99)

## 2019-01-01 LAB — POCT I-STAT EG7
Acid-base deficit: 3 mmol/L — ABNORMAL HIGH (ref 0.0–2.0)
Acid-base deficit: 4 mmol/L — ABNORMAL HIGH (ref 0.0–2.0)
Bicarbonate: 20.5 mmol/L (ref 20.0–28.0)
Bicarbonate: 21.3 mmol/L (ref 20.0–28.0)
Calcium, Ion: 1.16 mmol/L (ref 1.15–1.40)
Calcium, Ion: 1.18 mmol/L (ref 1.15–1.40)
HCT: 20 % — ABNORMAL LOW (ref 36.0–46.0)
HCT: 21 % — ABNORMAL LOW (ref 36.0–46.0)
Hemoglobin: 6.8 g/dL — CL (ref 12.0–15.0)
Hemoglobin: 7.1 g/dL — ABNORMAL LOW (ref 12.0–15.0)
O2 Saturation: 66 %
O2 Saturation: 67 %
Potassium: 3.7 mmol/L (ref 3.5–5.1)
Potassium: 3.7 mmol/L (ref 3.5–5.1)
Sodium: 141 mmol/L (ref 135–145)
Sodium: 142 mmol/L (ref 135–145)
TCO2: 22 mmol/L (ref 22–32)
TCO2: 22 mmol/L (ref 22–32)
pCO2, Ven: 32.4 mmHg — ABNORMAL LOW (ref 44.0–60.0)
pCO2, Ven: 33.3 mmHg — ABNORMAL LOW (ref 44.0–60.0)
pH, Ven: 7.411 (ref 7.250–7.430)
pH, Ven: 7.413 (ref 7.250–7.430)
pO2, Ven: 33 mmHg (ref 32.0–45.0)
pO2, Ven: 34 mmHg (ref 32.0–45.0)

## 2019-01-01 LAB — BASIC METABOLIC PANEL
Anion gap: 14 (ref 5–15)
BUN: 67 mg/dL — ABNORMAL HIGH (ref 8–23)
CO2: 20 mmol/L — ABNORMAL LOW (ref 22–32)
Calcium: 9 mg/dL (ref 8.9–10.3)
Chloride: 104 mmol/L (ref 98–111)
Creatinine, Ser: 2.35 mg/dL — ABNORMAL HIGH (ref 0.44–1.00)
GFR calc Af Amer: 23 mL/min — ABNORMAL LOW (ref >60)
GFR calc non Af Amer: 19 mL/min — ABNORMAL LOW (ref >60)
Glucose, Bld: 161 mg/dL — ABNORMAL HIGH (ref 70–99)
Potassium: 3.7 mmol/L (ref 3.5–5.1)
Sodium: 138 mmol/L (ref 135–145)

## 2019-01-01 LAB — CBC
HCT: 26.8 % — ABNORMAL LOW (ref 36.0–46.0)
Hemoglobin: 8.4 g/dL — ABNORMAL LOW (ref 12.0–15.0)
MCH: 29.7 pg (ref 26.0–34.0)
MCHC: 31.3 g/dL (ref 30.0–36.0)
MCV: 94.7 fL (ref 80.0–100.0)
Platelets: 402 10*3/uL — ABNORMAL HIGH (ref 150–400)
RBC: 2.83 MIL/uL — ABNORMAL LOW (ref 3.87–5.11)
RDW: 16.9 % — ABNORMAL HIGH (ref 11.5–15.5)
WBC: 8.6 10*3/uL (ref 4.0–10.5)
nRBC: 0 % (ref 0.0–0.2)

## 2019-01-01 SURGERY — RIGHT HEART CATH
Anesthesia: LOCAL

## 2019-01-01 MED ORDER — MIDAZOLAM HCL 2 MG/2ML IJ SOLN
INTRAMUSCULAR | Status: AC
  Filled 2019-01-01: qty 2

## 2019-01-01 MED ORDER — SODIUM CHLORIDE 0.9% FLUSH: 3.0000 mL | INTRAVENOUS | Status: DC | PRN

## 2019-01-01 MED ORDER — SODIUM CHLORIDE 0.9% FLUSH: 3.0000 mL | Freq: Two times a day (BID) | INTRAVENOUS | Status: DC

## 2019-01-01 MED ORDER — HEPARIN (PORCINE) IN NACL 1000-0.9 UT/500ML-% IV SOLN
INTRAVENOUS | Status: AC
  Filled 2019-01-01: qty 500

## 2019-01-01 MED ORDER — LIDOCAINE HCL (PF) 1 % IJ SOLN
INTRAMUSCULAR | Status: AC
  Filled 2019-01-01: qty 30

## 2019-01-01 MED ORDER — HYDRALAZINE HCL 20 MG/ML IJ SOLN: 10.0000 mg | INTRAMUSCULAR | Status: DC | PRN

## 2019-01-01 MED ORDER — FENTANYL CITRATE (PF) 100 MCG/2ML IJ SOLN
INTRAMUSCULAR | Status: AC
  Filled 2019-01-01: qty 2

## 2019-01-01 MED ORDER — ONDANSETRON HCL 4 MG/2ML IJ SOLN: 4.0000 mg | Freq: Four times a day (QID) | INTRAMUSCULAR | Status: DC | PRN

## 2019-01-01 MED ORDER — SODIUM CHLORIDE 0.9 % IV SOLN: 250.0000 mL | INTRAVENOUS | Status: DC | PRN

## 2019-01-01 MED ORDER — LABETALOL HCL 5 MG/ML IV SOLN: 10.0000 mg | INTRAVENOUS | Status: DC | PRN

## 2019-01-01 MED ORDER — SODIUM CHLORIDE 0.9 % WEIGHT BASED INFUSION: 1.0000 mL/kg/h | INTRAVENOUS | Status: DC

## 2019-01-01 MED ORDER — MIDAZOLAM HCL 2 MG/2ML IJ SOLN
INTRAMUSCULAR | Status: DC | PRN
  Administered 2019-01-01: 1 mg via INTRAVENOUS

## 2019-01-01 MED ORDER — SODIUM CHLORIDE 0.9 % WEIGHT BASED INFUSION
3.0000 mL/kg/h | INTRAVENOUS | Status: AC
  Administered 2019-01-01: 3 mL/kg/h via INTRAVENOUS

## 2019-01-01 MED ORDER — ASPIRIN 81 MG PO CHEW
81.0000 mg | CHEWABLE_TABLET | ORAL | Status: AC
  Administered 2019-01-01: 81 mg via ORAL
  Filled 2019-01-01: qty 1

## 2019-01-01 MED ORDER — HEPARIN (PORCINE) IN NACL 1000-0.9 UT/500ML-% IV SOLN
INTRAVENOUS | Status: DC | PRN
  Administered 2019-01-01: 500 mL

## 2019-01-01 MED ORDER — LIDOCAINE HCL (PF) 1 % IJ SOLN
INTRAMUSCULAR | Status: DC | PRN
  Administered 2019-01-01: 15 mL via INTRADERMAL

## 2019-01-01 MED ORDER — FENTANYL CITRATE (PF) 100 MCG/2ML IJ SOLN
INTRAMUSCULAR | Status: DC | PRN
  Administered 2019-01-01: 25 ug via INTRAVENOUS

## 2019-01-01 MED ORDER — ACETAMINOPHEN 325 MG PO TABS: 650.0000 mg | ORAL_TABLET | ORAL | Status: DC | PRN

## 2019-01-01 SURGICAL SUPPLY — 6 items
CATH SWAN GANZ 7F STRAIGHT (CATHETERS) ×2 IMPLANT
PACK CARDIAC CATHETERIZATION (CUSTOM PROCEDURE TRAY) ×2 IMPLANT
SHEATH PINNACLE 7F 10CM (SHEATH) ×2 IMPLANT
SHEATH PROBE COVER 6X72 (BAG) ×2 IMPLANT
TRANSDUCER W/STOPCOCK (MISCELLANEOUS) ×2 IMPLANT
TUBING CIL FLEX 10 FLL-RA (TUBING) ×2 IMPLANT

## 2019-01-01 NOTE — Discharge Instructions (Signed)
Femoral Site Care °This sheet gives you information about how to care for yourself after your procedure. Your health care provider may also give you more specific instructions. If you have problems or questions, contact your health care provider. °What can I expect after the procedure? °After the procedure, it is common to have: °· Bruising that usually fades within 1-2 weeks. °· Tenderness at the site. °Follow these instructions at home: °Wound care °· Follow instructions from your health care provider about how to take care of your insertion site. Make sure you: °? Wash your hands with soap and water before you change your bandage (dressing). If soap and water are not available, use hand sanitizer. °? Change your dressing as told by your health care provider. °? Leave stitches (sutures), skin glue, or adhesive strips in place. These skin closures may need to stay in place for 2 weeks or longer. If adhesive strip edges start to loosen and curl up, you may trim the loose edges. Do not remove adhesive strips completely unless your health care provider tells you to do that. °· Do not take baths, swim, or use a hot tub until your health care provider approves. °· You may shower 24-48 hours after the procedure or as told by your health care provider. °? Gently wash the site with plain soap and water. °? Pat the area dry with a clean towel. °? Do not rub the site. This may cause bleeding. °· Do not apply powder or lotion to the site. Keep the site clean and dry. °· Check your femoral site every day for signs of infection. Check for: °? Redness, swelling, or pain. °? Fluid or blood. °? Warmth. °? Pus or a bad smell. °Activity °· For the first 2-3 days after your procedure, or as long as directed: °? Avoid climbing stairs as much as possible. °? Do not squat. °· Do not lift anything that is heavier than 10 lb (4.5 kg), or the limit that you are told, until your health care provider says that it is safe. °· Rest as  directed. °? Avoid sitting for a long time without moving. Get up to take short walks every 1-2 hours. °· Do not drive for 24 hours if you were given a medicine to help you relax (sedative). °General instructions °· Take over-the-counter and prescription medicines only as told by your health care provider. °· Keep all follow-up visits as told by your health care provider. This is important. °Contact a health care provider if you have: °· A fever or chills. °· You have redness, swelling, or pain around your insertion site. °Get help right away if: °· The catheter insertion area swells very fast. °· You pass out. °· You suddenly start to sweat or your skin gets clammy. °· The catheter insertion area is bleeding, and the bleeding does not stop when you hold steady pressure on the area. °· The area near or just beyond the catheter insertion site becomes pale, cool, tingly, or numb. °These symptoms may represent a serious problem that is an emergency. Do not wait to see if the symptoms will go away. Get medical help right away. Call your local emergency services (911 in the [place] Do not drive yourself to the hospital. °Summary °· After the procedure, it is common to have bruising that usually fades within 1-2 weeks. °· Check your femoral site every day for signs of infection. °· Do not lift anything that is heavier than 10 lb (4.5 kg), or the   limit that you are told, until your health care provider says that it is safe. °This information is not intended to replace advice given to you by your health care provider. Make sure you discuss any questions you have with your health care provider. °Document Released: 05/02/2014 Document Revised: 09/11/2017 Document Reviewed: 09/11/2017 °Elsevier Interactive Patient Education © 2019 [employer] Inc. ° °

## 2019-01-01 NOTE — Interval H&P Note (Signed)
History and Physical Interval Note:  01/01/2019 2:27 PM  Alisha [name]  has presented today for surgery, with the diagnosis of hp.  The various methods of treatment have been discussed with the patient and family. After consideration of risks, benefits and other options for treatment, the patient has consented to  Procedure(s): RIGHT HEART CATH (N/A) as a surgical intervention.  The patient's history has been reviewed, patient examined, no change in status, stable for surgery.  I have reviewed the patient's chart and labs.  Questions were answered to the patient's satisfaction.     Leilanny Fluitt [employer]

## 2019-01-01 NOTE — Progress Notes (Signed)
  Echocardiogram 2D Echocardiogram has been performed.  Alisha [name] 01/01/2019, 4:49 PM

## 2019-01-01 NOTE — Consult Note (Signed)
No bleeding or hematoma after ambulation

## 2019-01-01 NOTE — Progress Notes (Signed)
34f sheath was removed from patient's RFV using manual pressure for 10 minutes.  No hematoma noted and patient tolerated well.  Tegaderm was applied to the site and post care instructions were given to patient.

## 2019-01-01 NOTE — Progress Notes (Signed)
Gave d/c instructions to son, Finnleigh Marchetti, via telephone due to [place] restrictions.  No questions at this time and Louie Casa verbalized understanding

## 2019-01-02 ENCOUNTER — Telehealth (HOSPITAL_COMMUNITY): Payer: Self-pay

## 2019-01-02 ENCOUNTER — Encounter (HOSPITAL_COMMUNITY): Payer: Self-pay | Admitting: Cardiology

## 2019-01-02 DIAGNOSIS — I272 Pulmonary hypertension, unspecified: Secondary | ICD-10-CM

## 2019-01-02 NOTE — Telephone Encounter (Signed)
Relayed results to pt & son, went over normal vital ranges and CBC results. Confirmed upcoming appt. Denied any further questions.

## 2019-01-02 NOTE — Telephone Encounter (Signed)
1. Needs to transition off Adcirca and onto Adempas. Please arrange the transition. May need pharmacy to help. /routed to [place] 2. Needs CBC and BMET in 1 week. I decreased her Lasix today. /29 April @11  3. Needs telehealth followup in 10 days. 4 May @11  4. Remind her to make an appointment with her GI doctor to address new anemia. /completed

## 2019-01-09 ENCOUNTER — Other Ambulatory Visit (HOSPITAL_COMMUNITY): Payer: Medicare Other

## 2019-01-09 ENCOUNTER — Encounter (HOSPITAL_COMMUNITY): Payer: Self-pay

## 2019-01-14 ENCOUNTER — Ambulatory Visit (HOSPITAL_COMMUNITY)
Admission: RE | Admit: 2019-01-14 | Discharge: 2019-01-14 | Disposition: A | Discharge status: Home / Self Care | Payer: Medicare Other | Source: Ambulatory Visit | Attending: Cardiology | Admitting: Cardiology

## 2019-01-14 ENCOUNTER — Telehealth (HOSPITAL_COMMUNITY): Payer: Medicare Other | Admitting: Cardiology

## 2019-01-14 ENCOUNTER — Encounter (HOSPITAL_COMMUNITY): Payer: Self-pay

## 2019-01-14 ENCOUNTER — Other Ambulatory Visit: Payer: Self-pay

## 2019-01-14 ENCOUNTER — Other Ambulatory Visit (HOSPITAL_COMMUNITY): Payer: Medicare Other

## 2019-01-14 ENCOUNTER — Telehealth (HOSPITAL_COMMUNITY): Payer: Self-pay

## 2019-01-14 DIAGNOSIS — I272 Pulmonary hypertension, unspecified: Secondary | ICD-10-CM

## 2019-01-14 DIAGNOSIS — I5032 Chronic diastolic (congestive) heart failure: Secondary | ICD-10-CM | POA: Diagnosis not present

## 2019-01-14 MED ORDER — FUROSEMIDE 40 MG PO TABS: ORAL_TABLET | ORAL | 1 refills | Status: DC

## 2019-01-14 NOTE — Telephone Encounter (Signed)
Spoke with Julie,pharmacist, from [facility] to up titrate Uptravi per DM orders, she will send out a titration pack and call pt to go over instructions.

## 2019-01-14 NOTE — Progress Notes (Signed)
Heart Failure TeleHealth Note  Due to national recommendations of social distancing due to [place] 19, Audio/video telehealth visit is felt to be most appropriate for this patient at this time.  See MyChart message from today for patient consent regarding telehealth for [facility].  Date:  01/14/2019   ID:  Alisha [name], DOB 1941/03/30, MRN [id number]  Location: Home  Provider location: [place] [facility] Type of Visit: Established patient   PCP:  Neila Gear, MD  Cardiologist:  Dr. Aundra Dubin  Chief Complaint: Shortness of breath   History of Present Illness: Alisha [name] is a 78 y.o. female who presents via audio/video conferencing for a telehealth visit today.     she denies symptoms worrisome for COVID 30.   78 y.o.with history of diabetes, HTN, PAD, hyperlipidemia who was referred by Dr. Wynonia Lawman for evaluation of pulmonary hypertension.   For > 1 year, she has had significant exertional dyspnea. It has been steadily worsening, especially over the last few months.  She has been extensively worked up so far.  Echo in 4/18 showed preserved EF 65% with moderate pulmonary hypertension.  She had episodes of syncope in 5/18 and 10/18.  She wore an event monitor in 5/18 with no significant arrhythmia.  LHC/RHC in 1/19 showed normal PCWP and severely elevated PA pressure, no significant CAD.    At initial appointment in 1/19, she was noted to be hypoxemic with exertion and home oxygen was started for use with exertion.  I also started her on Opsumit. She had to stop this after about a week due to significantly increased exertional peripheral edema.  This resolved after Opsumit was stopped.   Echo in 4/19.  This showed EF 60-65%, mild MS, mild AS, moderately dilated RV with mildly decreased systolic function.   She was found to have Fe deficiency anemia, FOBT+.  EGD showed gastritis and c-scope showed diverticulosis and polyps in 4/19.  She get IV Fe.  She is going  to have a capsule endoscopy.   She tried to start ambrisentan, but developed worsening dyspnea and peripheral edema and low oxygen saturation.  This improved after ambrisentan was stopped.   She is now taking Alisha [name] and Uptravi.  RHC in 4/20 showed normal filling pressures but severe pulmonary hypertension with PVR 6 WU.  Echo showed moderately dilated RV with normal systolic function, mild-moderate MS, mild AS.   She has not taken any Lasix since last Wednesday, was told to stop by her nephrologist due to elevated creatinine (most recent 2.2).  She says that she has not gained weight since then and does not have peripheral edema.  She is less short of breath over the last few weeks, able to walk a block without dyspnea.  She got IV Fe for anemia recently, no blood.  Hematology was going to refer her to [facility] for consideration of deep enteroscopy to look for small bowel AVMs.   6 minute walk (1/19): 92 m, oxygen saturation dropped to 70%.   6 minute walk (3/19): 122 m 6 minute walk (4/19): 392 m 6 minute walk (9/19): 317 m 6 minute walk (1/20): 320 m  Labs (1/19): anti-Jo1 negative, RF borderline positive, SCL-70 negative, HIV negative. ESR 21. BNP 1129.  Labs (3/19): ANA + => anti-dsDNA negative, SSA/SSB negative, anti-Sm negative, anti-RNP negative. K 3.8 => 3.7, creatinine 1.4 => 1.55.  Labs (4/19): K 4.1 => 3.1, creatinine 1.58 => 1.2, BNP 1902, CCP Ab negative.  Labs (  6/19): hgb 9, creatinine 1.66, K 3 Labs (7/19): K 4.9, creatinine 1.7 Labs (8/19): K 3.6, creatinine 1.36 Labs (9/19): K 4.1, creatinine 1.55, BNP 641 Labs (10/19): K 4.7, creatinine 1.88, BNP 595 Labs (1/20): Hgb 12.2 Labs (4/20): K 4.2, creatinine 2.2, hgb 8.4  PMH: 1. PAD: Non-healing left ankle ulcer.  - ABIs 9/12 were normal.  2. Type 2 diabetes 3. Hypothyroidism 4. CKD stage 3 5. Hyperlipidemia 6. HTN 7. IBS 8. OSA: Mild, on CPAP.  9. Event monitor 5/18: No significant abnormality.  10. DVT in  1995 11. Pulmonary hypertension: Echo (4/18) with EF 65%, mild MR, mild TR, mild mitral stenosis, moderate pulmonary hypertension. - CTA chest 10/17: No PE.  - RHC (1/19): mean RA 11, PA 87/31 mean 51, mean PCWP 6, CI 3.58, PVR 7 WU.  - PFTs (1/19): mild obstruction, some restriction => severely decreased DLCO. - High resolution CT chest (1/19): no interstitial lung disease or emphysema. 7 mm nodule RUL.  - anti-Jo1 negative, RF borderline positive, SCL-70 negative, HIV negative. ANA + => anti-dsDNA negative, SSA/SSB negative, anti-Sm negative, anti-RNP negative. - V/Q scan 2/19: No e/o chronic PE.  - Echo (4/19): EF 60-65%, mild LVH, mild aortic stenosis, mild mitral stenosis, moderately dilated RV with mildly decreased systolic function, PASP 36 mmHg (poor envelope, may be underestimated).  - Does not tolerate ERAs (macitentan, ambrisentan).  - RHC (4/20): mean RA 5, PA 85/22 mean 51, mean PCWP 12, CI  3.5, PVR 6 WU.  - Echo (4/20): EF > 65%, moderate LVH, RV moderately dilated with normal systolic function, moderate pericardial effusion, mild-moderate mitral stenosis with mean gradient 7/MVA by PHT 2.48 cm^2, mild AS mean gradient 12.  12. Coronary angiography 11/18 without significant disease.  - Coronary angiography in 1/19 with luminal irregularities 13. Mitral stenosis: Mild by cath in 1/19, mean gradient 2.6 mmHg. Mild on 4/19 echo.  - Mild-moderate by echo in 4/20.  14. Aortic stenosis: Mild on 4/19 echo.  - Mild on 4/20 echo.  15. Fe deficiency anemia: EGD showed gastritis and c-scope showed diverticulosis and polyps in 4/19. Capsule endoscopy was unrevealing.  16. Pulmonary nodule: CT chest 7/19 with RUL nodule.   Current Outpatient Medications  Medication Sig Dispense Refill   amLODipine (NORVASC) 5 MG tablet Take 0.5 tablets (2.5 mg total) by mouth daily. 45 tablet 3   atorvastatin (LIPITOR) 20 MG tablet Take 20 mg by mouth every morning.     carvedilol (COREG) 12.5 MG tablet  Take 12.5 mg by mouth 2 (two) times daily.     cetirizine (ZYRTEC) 10 MG tablet Take 10 mg by mouth daily as needed for allergies.     fenofibrate 160 MG tablet Take 160 mg by mouth every morning.     ferrous sulfate 325 (65 FE) MG tablet Take 325 mg by mouth daily with breakfast.     furosemide (LASIX) 40 MG tablet Take 2 tablets (80 mg total) by mouth every morning AND 1 tablet (40 mg total) at bedtime. 270 tablet 1   glimepiride (AMARYL) 4 MG tablet Take 4 mg by mouth daily with breakfast.     levothyroxine (SYNTHROID, LEVOTHROID) 125 MCG tablet Take 125 mcg by mouth daily before breakfast.     loperamide (IMODIUM A-D) 2 MG tablet Take 2 mg by mouth 4 (four) times daily as needed for diarrhea or loose stools.     loratadine (CLARITIN) 10 MG tablet Take 10 mg by mouth daily as needed for allergies.  losartan (COZAAR) 100 MG tablet Take 100 mg by mouth daily.     metolazone (ZAROXOLYN) 2.5 MG tablet Take 1 tablet (2.5 mg total) by mouth once a week. Every Saturday 10 tablet 6   Multiple Vitamins-Minerals (PRESERVISION AREDS 2 PO) Take 1 capsule by mouth 2 (two) times daily.     pantoprazole (PROTONIX) 40 MG tablet Take 40 mg by mouth every morning.     potassium chloride (KLOR-CON M10) 10 MEQ tablet Take 2 tabs in AM and 1 tab in PM, take an extra tab on Saturdays 90 tablet 3   Selexipag (UPTRAVI) 200 MCG TABS Take 8 tablets (1,600 mcg total) by mouth 2 (two) times daily. 420 tablet 3   spironolactone (ALDACTONE) 25 MG tablet TAKE 1/2 TABLET BY MOUTH EVERY DAY (Patient taking differently: Take 12.5 mg by mouth daily. ) 45 tablet 1   tadalafil, PAH, (ADCIRCA) 20 MG tablet Take 2 tablets (40 mg total) by mouth daily. 180 tablet 3   No current facility-administered medications for this encounter.       Allergies:   Other and Adhesive [tape]   Social History:  The patient  reports that she has never smoked. She has never used smokeless tobacco. She reports that she does not  drink alcohol or use drugs.   Family History:  The patient's family history is not on file.   ROS:  Please see the history of present illness.   All other systems are personally reviewed and negative.   Exam:  (Video/Tele Health Call; Exam is subjective and or/visual.) BP 107/55, HR 76 General:  Speaks in full sentences. No resp difficulty. Neck: No JVD.  Lungs: Normal respiratory effort with conversation.  Abdomen: Non-distended per patient report Extremities: Pt denies edema. Neuro: Alert & oriented x 3.   Recent Labs: 10/08/2018: B Natriuretic Peptide 708.8 01/01/2019: BUN 67; Creatinine, Ser 2.35; Hemoglobin 7.1; Hemoglobin 6.8; Platelets 402; Potassium 3.7; Potassium 3.7; Sodium 142; Sodium 141  Personally reviewed   Wt Readings from Last 3 Encounters:  01/01/19 73.5 kg (162 lb)  10/15/18 76.2 kg (168 lb)  10/08/18 76.4 kg (168 lb 6 oz)      ASSESSMENT AND PLAN:  1. Pulmonary hypertension:  Severe PAH with PVR 7 WU on 1/19 RHC.  Etiology uncertain.  She has a history of DVT remotely but V/Q scan in 2/19 did not show evidence for chronic PE. She has OSA and uses CPAP.  Serologic workup showed only positive ANA but when additional serologies were done, they were all negative (see above).  CT chest with no ILD or emphysema.  Valvular heart disease does not appear significant enough to cause this degree of PH.  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  Echo in 4/19 showed that the RV is moderately dilated with mildly decreased systolic function, PA systolic pressure estimation was only 36 mmHg but poor envelope and likely underestimated. She did not tolerate Opsumit or ambrisentan due to significant lower extremity edema and worsening dyspnea.  Alisha [name] in 4/20 showed severe pulmonary hypertension with normal filling pressures and preserved cardiac output.  Echo in 4/20 showed moderate RV dilation with normal systolic function. NYHA class II symptoms.     - I am going to transition her from Adcirca to Adempas to  see if this helps her symptomatically.   [name] is up to 1600 mcg BID.  I will have her home nurse continue to slowly increase this farther.  - With borderline positive RF, CCP ab was checked and  was negative.  2. Syncope: Suspect related to severe pulmonary hypertension.  None recently.   3. Mitral stenosis: Noted on echo and cath, mild-moderate.  No change.  4. H/o DVT: No chronic PE on V/Q scan.   5. Chronic diastolic CHF/RV failure: In setting of PAH and some degree of cor pulmonale.  NYHA class II symptoms, She is not volume overloaded on exam and weight is down.    - She will need to restart Lasix.  Will use Lasix 80 qam/40 qpm. BMET 1 week.  - Continue metolazone 2.5 qSaturday.  She will take KCl 40 bid on metolazone days.   - Continue spiro 12.5 mg daily.  6. HTN: BP controlled.  Continue amlodipine.  7. OSA: Continue CPAP qHS.  8. Lung nodule: Repeat CT to follow in 2/21.  9. CKD: Stage 3.  Check BMET 1 week.   10. Aortic stenosis: Mild on 4/20 echo.    COVID screen The patient does not have any symptoms that suggest any further testing/ screening at this time.  Social distancing reinforced today.  Patient Risk: After full review of this patients clinical status, I feel that they are at moderate risk for cardiac decompensation at this time.  Relevant cardiac medications were reviewed at length with the patient today. The patient does not have concerns regarding their medications at this time.   Recommended follow-up:  2 months  Today, I have spent 22 minutes with the patient with telehealth technology discussing the above issues .    Signed, Loralie Champagne, MD  01/14/2019   [place] [address] [facility] and [place] [address] [phone] (office) [phone] (fax)

## 2019-01-21 ENCOUNTER — Other Ambulatory Visit (HOSPITAL_COMMUNITY): Payer: Medicare Other

## 2019-01-23 ENCOUNTER — Ambulatory Visit (HOSPITAL_COMMUNITY)
Admission: RE | Admit: 2019-01-23 | Discharge: 2019-01-23 | Disposition: A | Discharge status: Home / Self Care | Payer: Medicare Other | Source: Ambulatory Visit | Attending: Cardiology | Admitting: Cardiology

## 2019-01-23 ENCOUNTER — Other Ambulatory Visit: Payer: Self-pay

## 2019-01-23 DIAGNOSIS — I5032 Chronic diastolic (congestive) heart failure: Secondary | ICD-10-CM | POA: Diagnosis not present

## 2019-01-23 LAB — BASIC METABOLIC PANEL
Anion gap: 12 (ref 5–15)
BUN: 38 mg/dL — ABNORMAL HIGH (ref 8–23)
CO2: 20 mmol/L — ABNORMAL LOW (ref 22–32)
Calcium: 8 mg/dL — ABNORMAL LOW (ref 8.9–10.3)
Chloride: 106 mmol/L (ref 98–111)
Creatinine, Ser: 2.48 mg/dL — ABNORMAL HIGH (ref 0.44–1.00)
GFR calc Af Amer: 21 mL/min — ABNORMAL LOW (ref >60)
GFR calc non Af Amer: 18 mL/min — ABNORMAL LOW (ref >60)
Glucose, Bld: 175 mg/dL — ABNORMAL HIGH (ref 70–99)
Potassium: 3.9 mmol/L (ref 3.5–5.1)
Sodium: 138 mmol/L (ref 135–145)

## 2019-01-25 ENCOUNTER — Telehealth (HOSPITAL_COMMUNITY): Payer: Self-pay

## 2019-01-25 DIAGNOSIS — I5022 Chronic systolic (congestive) heart failure: Secondary | ICD-10-CM

## 2019-01-25 NOTE — Telephone Encounter (Signed)
Pt aware of lab results. Pt will go to primary MD to repeat blood work in 10 days. Order faxed to Dr. Clearence Cheek office. Pt reports will call office if she has any problems getting this done

## 2019-01-25 NOTE — Telephone Encounter (Signed)
-----   Message from Larey Dresser, MD sent at 01/23/2019  4:16 PM EDT ----- BUN lower.  No changes for now but repeat BMET in 10 days.

## 2019-01-30 ENCOUNTER — Encounter (HOSPITAL_COMMUNITY): Payer: Self-pay

## 2019-02-04 ENCOUNTER — Other Ambulatory Visit (HOSPITAL_COMMUNITY): Payer: Self-pay | Admitting: Cardiology

## 2019-02-14 ENCOUNTER — Other Ambulatory Visit (HOSPITAL_COMMUNITY): Payer: Self-pay | Admitting: Cardiology

## 2019-03-06 ENCOUNTER — Other Ambulatory Visit (HOSPITAL_COMMUNITY): Payer: Self-pay

## 2019-03-06 MED ORDER — SELEXIPAG 1600 MCG PO TABS: 2400.0000 ug | ORAL_TABLET | Freq: Two times a day (BID) | ORAL | 11 refills | Status: DC

## 2019-03-06 NOTE — Telephone Encounter (Signed)
Received rx refill request from [facility] for Uptravi 2485mcg bid.  Order confirmed and signed by MD Mclean. Order faxed to [place].Marland Kitchen Confirmation received.

## 2019-03-19 ENCOUNTER — Encounter (HOSPITAL_COMMUNITY): Payer: Medicare Other | Admitting: Cardiology

## 2019-03-20 ENCOUNTER — Encounter (HOSPITAL_COMMUNITY): Payer: Medicare Other | Admitting: Cardiology

## 2019-03-26 ENCOUNTER — Telehealth (HOSPITAL_COMMUNITY): Payer: Self-pay | Admitting: *Deleted

## 2019-03-26 NOTE — Telephone Encounter (Signed)
Per last OV note back in May Dr Aundra Dubin wanted to change pt's Adcirca to Adempas, forms were faxed into [employer] on 5/27 and again on 6/12 however pt has never heard back from anyone regarding medication.  I contacted [employer] and they do not show the application on file and do not even have pt in their system.  Spoke w/Tavien, a rep, she advised I can directly fax the application to her and she will follow up and get it taken care of.  Application and Chief Financial Officer to her at [email or web address]  Pt is aware we are working on this and we will keep her posted, she will continue Adcirca for now.

## 2019-04-01 ENCOUNTER — Telehealth (HOSPITAL_COMMUNITY): Payer: Self-pay | Admitting: Pharmacy Technician

## 2019-04-01 ENCOUNTER — Other Ambulatory Visit (HOSPITAL_COMMUNITY): Payer: Self-pay

## 2019-04-01 NOTE — Telephone Encounter (Signed)
Advanced Heart Failure Patient Advocate Encounter  Prior Authorization for Adempas 0.5mg  has been approved.    PA# [id number] Effective dates: 09/12/2019  Will also send a message to Nira Conn to inform her of this approval.   Charlann Boxer, CPhT

## 2019-04-04 DIAGNOSIS — I270 Primary pulmonary hypertension: Secondary | ICD-10-CM | POA: Diagnosis not present

## 2019-04-05 ENCOUNTER — Encounter (HOSPITAL_COMMUNITY): Payer: Self-pay

## 2019-04-05 ENCOUNTER — Telehealth (HOSPITAL_COMMUNITY): Payer: Self-pay

## 2019-04-05 NOTE — Telephone Encounter (Signed)
Home health nurse called stating pt has not been feeling well, has been having difficulty urinating x3-4 days and severe back pain that radiates down her leg. BP this am was 101/54 HR 75 sitting and 91/45 HR 74 standing. Nurse would like to know if DM still wants to begin Adempas tomorrow. Please advise.

## 2019-04-07 NOTE — Telephone Encounter (Signed)
Would wait until she feels better, can do this week.

## 2019-04-08 ENCOUNTER — Ambulatory Visit (HOSPITAL_COMMUNITY)
Admission: RE | Admit: 2019-04-08 | Discharge: 2019-04-08 | Disposition: A | Discharge status: Home / Self Care | Payer: Medicare Other | Source: Ambulatory Visit | Attending: Cardiology | Admitting: Cardiology

## 2019-04-08 ENCOUNTER — Telehealth (HOSPITAL_COMMUNITY): Payer: Self-pay

## 2019-04-08 ENCOUNTER — Other Ambulatory Visit: Payer: Self-pay

## 2019-04-08 DIAGNOSIS — I5022 Chronic systolic (congestive) heart failure: Secondary | ICD-10-CM | POA: Insufficient documentation

## 2019-04-08 LAB — BASIC METABOLIC PANEL
Anion gap: 15 (ref 5–15)
BUN: 60 mg/dL — ABNORMAL HIGH (ref 8–23)
CO2: 20 mmol/L — ABNORMAL LOW (ref 22–32)
Calcium: 8.6 mg/dL — ABNORMAL LOW (ref 8.9–10.3)
Chloride: 97 mmol/L — ABNORMAL LOW (ref 98–111)
Creatinine, Ser: 2.07 mg/dL — ABNORMAL HIGH (ref 0.44–1.00)
GFR calc Af Amer: 26 mL/min — ABNORMAL LOW (ref >60)
GFR calc non Af Amer: 23 mL/min — ABNORMAL LOW (ref >60)
Glucose, Bld: 226 mg/dL — ABNORMAL HIGH (ref 70–99)
Potassium: 3.2 mmol/L — ABNORMAL LOW (ref 3.5–5.1)
Sodium: 132 mmol/L — ABNORMAL LOW (ref 135–145)

## 2019-04-08 LAB — CBC
HCT: 26.6 % — ABNORMAL LOW (ref 36.0–46.0)
Hemoglobin: 8 g/dL — ABNORMAL LOW (ref 12.0–15.0)
MCH: 29.9 pg (ref 26.0–34.0)
MCHC: 30.1 g/dL (ref 30.0–36.0)
MCV: 99.3 fL (ref 80.0–100.0)
Platelets: 410 10*3/uL — ABNORMAL HIGH (ref 150–400)
RBC: 2.68 MIL/uL — ABNORMAL LOW (ref 3.87–5.11)
RDW: 15.6 % — ABNORMAL HIGH (ref 11.5–15.5)
WBC: 8.3 10*3/uL (ref 4.0–10.5)
nRBC: 0 % (ref 0.0–0.2)

## 2019-04-08 NOTE — Telephone Encounter (Signed)
Pt will hold off as ordered. Pt has f/u on Wednesday with MD. Pt verbalized understanding

## 2019-04-08 NOTE — Telephone Encounter (Signed)
Called son and pt to review lasix order. Reviewed with son but he would like to make sure that the pt gets the same review. Left message on pt phone for call back.   Hold Lasix for a day then decrease to 40 mg daily x 4 days. Can restart Lasix 80 qam/40 qpm after that as long as she is feeling better and eating/drinking normally (infection resolving).

## 2019-04-08 NOTE — Telephone Encounter (Signed)
Please have her get a BMET drawn to make that decision.

## 2019-04-08 NOTE — Telephone Encounter (Signed)
Made lab appointment for pt. She also wanted to have a cbc drawn for her pcp. Added it to the list.

## 2019-04-08 NOTE — Telephone Encounter (Signed)
Pt aware of lab results and recommendations. Pt has f/u appt with Dr Aundra Dubin on Wednesday 7/29. Pt also has appt with primary tomorrow. Pt verbalized understanding

## 2019-04-08 NOTE — Telephone Encounter (Signed)
Pt has question: should she continue to the furosemide while she is having this issue/pain with her kidneys? She went to urgent care over the weekend and was put on abx and is calling to get in with her primary this week. She was just unsure whether she should stop the furosemide until she is feeling better as well?

## 2019-04-09 ENCOUNTER — Telehealth (HOSPITAL_COMMUNITY): Payer: Self-pay

## 2019-04-09 NOTE — Telephone Encounter (Signed)
Received fax from [facility]. This was the "start of care" visit. "discussed [place] disease process, Adempas use, side effects and management, dosing, titrations, missed dose scenarios, all ofther aspects of Adempas therapy, [id number], use of BP monitor and journaling. Pt verbalized understanding. Pt has already been on other oral PAH meds and is familiar with side effects and [facility] procedures.   Next visit 04/18/2019.  Comments: "Standing bp 101/54, HR 75; sitting 91/45 HR 74. Pt has experienced lower right back pain X3 days, visibly uncomfortable. Pt taking extra lasix to relieve LE Swelling & decrease difficulty urinating. Back pain unrelieved by OTC pain meds. Pt called Dr. Claris Gladden office to determine if more Lasix should be taken and/ or Adempas therapy started.

## 2019-04-10 ENCOUNTER — Other Ambulatory Visit: Payer: Self-pay

## 2019-04-10 ENCOUNTER — Encounter (HOSPITAL_COMMUNITY): Payer: Self-pay | Admitting: Cardiology

## 2019-04-10 ENCOUNTER — Ambulatory Visit (HOSPITAL_COMMUNITY)
Admission: RE | Admit: 2019-04-10 | Discharge: 2019-04-10 | Disposition: A | Discharge status: Home / Self Care | Payer: Medicare Other | Source: Ambulatory Visit | Attending: Cardiology | Admitting: Cardiology

## 2019-04-10 VITALS — BP 100/48 | HR 59 | Wt 168.4 lb

## 2019-04-10 DIAGNOSIS — N183 Chronic kidney disease, stage 3 unspecified: Secondary | ICD-10-CM

## 2019-04-10 DIAGNOSIS — B9620 Unspecified Escherichia coli [E. coli] as the cause of diseases classified elsewhere: Secondary | ICD-10-CM | POA: Diagnosis not present

## 2019-04-10 DIAGNOSIS — Z888 Allergy status to other drugs, medicaments and biological substances status: Secondary | ICD-10-CM | POA: Insufficient documentation

## 2019-04-10 DIAGNOSIS — R55 Syncope and collapse: Secondary | ICD-10-CM | POA: Insufficient documentation

## 2019-04-10 DIAGNOSIS — E1122 Type 2 diabetes mellitus with diabetic chronic kidney disease: Secondary | ICD-10-CM | POA: Diagnosis not present

## 2019-04-10 DIAGNOSIS — E039 Hypothyroidism, unspecified: Secondary | ICD-10-CM | POA: Diagnosis not present

## 2019-04-10 DIAGNOSIS — E785 Hyperlipidemia, unspecified: Secondary | ICD-10-CM | POA: Insufficient documentation

## 2019-04-10 DIAGNOSIS — K589 Irritable bowel syndrome without diarrhea: Secondary | ICD-10-CM | POA: Insufficient documentation

## 2019-04-10 DIAGNOSIS — I5032 Chronic diastolic (congestive) heart failure: Secondary | ICD-10-CM | POA: Diagnosis not present

## 2019-04-10 DIAGNOSIS — R911 Solitary pulmonary nodule: Secondary | ICD-10-CM | POA: Diagnosis not present

## 2019-04-10 DIAGNOSIS — Z79890 Hormone replacement therapy (postmenopausal): Secondary | ICD-10-CM | POA: Diagnosis not present

## 2019-04-10 DIAGNOSIS — I272 Pulmonary hypertension, unspecified: Secondary | ICD-10-CM | POA: Diagnosis not present

## 2019-04-10 DIAGNOSIS — Z79899 Other long term (current) drug therapy: Secondary | ICD-10-CM | POA: Diagnosis not present

## 2019-04-10 DIAGNOSIS — I130 Hypertensive heart and chronic kidney disease with heart failure and stage 1 through stage 4 chronic kidney disease, or unspecified chronic kidney disease: Secondary | ICD-10-CM | POA: Insufficient documentation

## 2019-04-10 DIAGNOSIS — G4733 Obstructive sleep apnea (adult) (pediatric): Secondary | ICD-10-CM | POA: Insufficient documentation

## 2019-04-10 DIAGNOSIS — N390 Urinary tract infection, site not specified: Secondary | ICD-10-CM | POA: Insufficient documentation

## 2019-04-10 DIAGNOSIS — Z86718 Personal history of other venous thrombosis and embolism: Secondary | ICD-10-CM | POA: Diagnosis not present

## 2019-04-10 DIAGNOSIS — Z7984 Long term (current) use of oral hypoglycemic drugs: Secondary | ICD-10-CM | POA: Insufficient documentation

## 2019-04-10 MED ORDER — TRAMADOL HCL 50 MG PO TABS: 50.0000 mg | ORAL_TABLET | Freq: Two times a day (BID) | ORAL | 0 refills | Status: DC | PRN

## 2019-04-10 NOTE — Progress Notes (Signed)
Date:  04/10/2019   ID:  Alisha [name], DOB 1940-11-07, MRN [id number]   Provider location: [facility] Type of Visit: Established patient   PCP:  Neila Gear, MD  Cardiologist:  Dr. Aundra Dubin  Chief Complaint: Shortness of breath   History of Present Illness: Alisha [name] is a 78 y.o. female with history of diabetes, HTN, PAD, hyperlipidemia who was referred by Dr. Wynonia Lawman for evaluation of pulmonary hypertension.   For > 1 year, she has had significant exertional dyspnea. It has been steadily worsening, especially over the last few months.  She has been extensively worked up so far.  Echo in 4/18 showed preserved EF 65% with moderate pulmonary hypertension.  She had episodes of syncope in 5/18 and 10/18.  She wore an event monitor in 5/18 with no significant arrhythmia.  LHC/RHC in 1/19 showed normal PCWP and severely elevated PA pressure, no significant CAD.    At initial appointment in 1/19, she was noted to be hypoxemic with exertion and home oxygen was started for use with exertion.  I also started her on Opsumit. She had to stop this after about a week due to significantly increased exertional peripheral edema.  This resolved after Opsumit was stopped.   Echo in 4/19.  This showed EF 60-65%, mild MS, mild AS, moderately dilated RV with mildly decreased systolic function.   She was found to have Fe deficiency anemia, FOBT+.  EGD showed gastritis and c-scope showed diverticulosis and polyps in 4/19.  She get IV Fe.  She is going to have a capsule endoscopy.   She tried to start ambrisentan, but developed worsening dyspnea and peripheral edema and low oxygen saturation.  This improved after ambrisentan was stopped.   She is now taking [place] and Uptravi.  RHC in 4/20 showed normal filling pressures but severe pulmonary hypertension with PVR 6 WU.  Echo showed moderately dilated RV with normal systolic function, mild-moderate MS, mild AS.  [name] has  now been titrated up to 2400 mcg bid.   She has had a hard time recently.  She has developed low back pain with radiation down her right leg, suspected to be sciatica.  She was diagnosed this weekend with an E coli UTI and is on Keflex.  She noted worsening urine output over the last week or so, now getting better.  She called about this last week, and I had her decrease Lasix to 40 mg daily for 4 days (she has taken lower dose Lasix for 2 days).  Hgb was down to 8 recently, She denies BRBPR/melena.   She was supposed to start on Adempas, but given the above issues we decided to hold off on the change until she stabilizes clinically.  Weight is stable.   6 minute walk (1/19): 92 m, oxygen saturation dropped to 70%.   6 minute walk (3/19): 122 m 6 minute walk (4/19): 392 m 6 minute walk (9/19): 317 m 6 minute walk (1/20): 320 m  Labs (1/19): anti-Jo1 negative, RF borderline positive, SCL-70 negative, HIV negative. ESR 21. BNP 1129.  Labs (3/19): ANA + => anti-dsDNA negative, SSA/SSB negative, anti-Sm negative, anti-RNP negative. K 3.8 => 3.7, creatinine 1.4 => 1.55.  Labs (4/19): K 4.1 => 3.1, creatinine 1.58 => 1.2, BNP 1902, CCP Ab negative.  Labs (6/19): hgb 9, creatinine 1.66, K 3 Labs (7/19): K 4.9, creatinine 1.7 Labs (8/19): K 3.6, creatinine 1.36 Labs (9/19): K 4.1, creatinine 1.55, BNP 641 Labs (  10/19): K 4.7, creatinine 1.88, BNP 595 Labs (1/20): Hgb 12.2 Labs (4/20): K 4.2, creatinine 2.2, hgb 8.4 Labs (7/20): K 3.2, creatinine 2.07, hgb 8  ECG (personally reviewed): NSR, right axis deviation.   PMH: 1. PAD: Non-healing left ankle ulcer.  - ABIs 9/12 were normal.  2. Type 2 diabetes 3. Hypothyroidism 4. CKD stage 3 5. Hyperlipidemia 6. HTN 7. IBS 8. OSA: on CPAP.  9. Event monitor 5/18: No significant abnormality.  10. DVT in 1995 11. Pulmonary hypertension: Echo (4/18) with EF 65%, mild MR, mild TR, mild mitral stenosis, moderate pulmonary hypertension. - CTA chest  10/17: No PE.  - RHC (1/19): mean RA 11, PA 87/31 mean 51, mean PCWP 6, CI 3.58, PVR 7 WU.  - PFTs (1/19): mild obstruction, some restriction => severely decreased DLCO. - High resolution CT chest (1/19): no interstitial lung disease or emphysema. 7 mm nodule RUL.  - anti-Jo1 negative, RF borderline positive, SCL-70 negative, HIV negative. ANA + => anti-dsDNA negative, SSA/SSB negative, anti-Sm negative, anti-RNP negative. - V/Q scan 2/19: No e/o chronic PE.  - Echo (4/19): EF 60-65%, mild LVH, mild aortic stenosis, mild mitral stenosis, moderately dilated RV with mildly decreased systolic function, PASP 36 mmHg (poor envelope, may be underestimated).  - Does not tolerate ERAs (macitentan, ambrisentan).  - RHC (4/20): mean RA 5, PA 85/22 mean 51, mean PCWP 12, CI  3.5, PVR 6 WU.  - Echo (4/20): EF > 65%, moderate LVH, RV moderately dilated with normal systolic function, moderate pericardial effusion, mild-moderate mitral stenosis with mean gradient 7/MVA by PHT 2.48 cm^2, mild AS mean gradient 12.  12. Coronary angiography 11/18 without significant disease.  - Coronary angiography in 1/19 with luminal irregularities 13. Mitral stenosis: Mild by cath in 1/19, mean gradient 2.6 mmHg. Mild on 4/19 echo.  - Mild-moderate by echo in 4/20.  14. Aortic stenosis: Mild on 4/19 echo.  - Mild on 4/20 echo.  15. Fe deficiency anemia: EGD showed gastritis and c-scope showed diverticulosis and polyps in 4/19. Capsule endoscopy was unrevealing.  16. Pulmonary nodule: CT chest 7/19 with RUL nodule.  17. Low back pain/sciatica  Current Outpatient Medications  Medication Sig Dispense Refill   atorvastatin (LIPITOR) 20 MG tablet Take 20 mg by mouth every morning.     cephALEXin (KEFLEX) 500 MG capsule Take by mouth.     cetirizine (ZYRTEC) 10 MG tablet Take 10 mg by mouth daily as needed for allergies.     fenofibrate 160 MG tablet Take 160 mg by mouth every morning.     ferrous sulfate 325 (65 FE) MG  tablet Take 325 mg by mouth daily with breakfast. Morning and night     furosemide (LASIX) 40 MG tablet Take 2 tablets (80 mg total) by mouth every morning AND 1 tablet (40 mg total) at bedtime. 270 tablet 1   glimepiride (AMARYL) 4 MG tablet Take 4 mg by mouth daily with breakfast.     levothyroxine (SYNTHROID, LEVOTHROID) 125 MCG tablet Take 125 mcg by mouth daily before breakfast.     loperamide (IMODIUM A-D) 2 MG tablet Take 2 mg by mouth 4 (four) times daily as needed for diarrhea or loose stools.     loratadine (CLARITIN) 10 MG tablet Take 10 mg by mouth daily as needed for allergies.      losartan (COZAAR) 100 MG tablet Take 100 mg by mouth daily.     metolazone (ZAROXOLYN) 2.5 MG tablet Take 1 tablet (2.5 mg total) by  mouth once a week. Every Saturday 10 tablet 6   Multiple Vitamins-Minerals (PRESERVISION AREDS 2 PO) Take 1 capsule by mouth 2 (two) times daily.     pantoprazole (PROTONIX) 40 MG tablet Take 40 mg by mouth every morning.     potassium chloride (KLOR-CON M10) 10 MEQ tablet Take 2 tabs in AM and 1 tab in PM, take an extra tab on Saturdays 90 tablet 3   Selexipag 1600 MCG TABS Take 2,400 mcg by mouth 2 (two) times a day. Pt will take one 83mg and one 16023m twice a day. Order faxed to [facility] 60 tablet 11   spironolactone (ALDACTONE) 25 MG tablet TAKE 1/2 TABLET BY MOUTH EVERY DAY (Patient taking differently: Take 12.5 mg by mouth as needed. ) 45 tablet 1   tadalafil, PAH, (ADCIRCA) 20 MG tablet Take 2 tablets (40 mg total) by mouth daily. 180 tablet 3   traMADol (ULTRAM) 50 MG tablet Take 1 tablet (50 mg total) by mouth every 12 (twelve) hours as needed for moderate pain. 20 tablet 0   No current facility-administered medications for this encounter.       Allergies:   Other and Adhesive [tape]   Social History:  The patient  reports that she has never smoked. She has never used smokeless tobacco. She reports that she does not drink alcohol or use drugs.     Family History:  The patient's family history is not on file.   ROS:  Please see the history of present illness.   All other systems are personally reviewed and negative.   Exam:   BP (!) 100/48    Pulse (!) 59    Wt 76.4 kg (168 lb 6.4 oz)    SpO2 100%    BMI 27.60 kg/m  General: NAD Neck: JVP 8-9 cm, no thyromegaly or thyroid nodule.  Lungs: Clear to auscultation bilaterally with normal respiratory effort. CV: Nondisplaced PMI.  Heart regular S1/S2, no S3W3/S93/6 systolic murmur RUSB with clear S2.  1+ edema 1/2 to knees bilaterally.  No carotid bruit.  Normal pedal pulses.  Abdomen: Soft, nontender, no hepatosplenomegaly, no distention.  Skin: Intact without lesions or rashes.  Neurologic: Alert and oriented x 3.  Psych: Normal affect. Extremities: No clubbing or cyanosis.  HEENT: Normal.   Recent Labs: 10/08/2018: B Natriuretic Peptide 708.8 04/08/2019: BUN 60; Creatinine, Ser 2.07; Hemoglobin 8.0; Platelets 410; Potassium 3.2; Sodium 132  Personally reviewed   Wt Readings from Last 3 Encounters:  04/10/19 76.4 kg (168 lb 6.4 oz)  01/01/19 73.5 kg (162 lb)  10/15/18 76.2 kg (168 lb)      ASSESSMENT AND PLAN:  1. Pulmonary hypertension:  Severe PAH with PVR 7 WU on 1/19 RHC.  Etiology uncertain.  She has a history of DVT remotely but V/Q scan in 2/19 did not show evidence for chronic PE. She has OSA and uses CPAP.  Serologic workup showed only positive ANA but when additional serologies were done, they were all negative (see above).  CT chest with no ILD or emphysema.  Valvular heart disease does not appear significant enough to cause this degree of PH.  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  Echo in 4/19 showed that the RV is moderately dilated with mildly decreased systolic function, PA systolic pressure estimation was only 36 mmHg but poor envelope and likely underestimated. She did not tolerate Opsumit or ambrisentan due to significant lower extremity edema and worsening dyspnea.  [facility] 4/20  showed severe pulmonary hypertension with  normal filling pressures and preserved cardiac output.  Echo in 4/20 showed moderate RV dilation with normal systolic function. NYHA class II symptoms.     - I am going to transition her from Adcirca to Adempas to see if this helps her symptomatically.  This was supposed to take place last week but we have held off given current problems (UTI, back pain).  She will restart Adcirca for now, will make the transition to Adempas when she feels better and appears to have stabilized.    [name] is up to 2400 mcg BID.  - With borderline positive RF, CCP ab was checked and was negative.  2. Syncope: Suspect related to severe pulmonary hypertension.  None recently.   3. Mitral stenosis: Noted on echo and cath, mild-moderate.  No change.  4. H/o DVT: No chronic PE on V/Q scan.   5. Chronic diastolic CHF/RV failure: In setting of PAH and some degree of cor pulmonale.  NYHA class III symptoms, she looks mildly volume overloaded.  For the last 2 days, she has been taking a lower Lasix dose. - She will restart Lasix 80 qam/40 pm tomorrow.  She will continue KCl 20 qam/10 qpm. BMET in 1 week.  - Continue metolazone 2.5 qSaturday.  She will take KCl 40 bid on metolazone days.   - Continue spironolactone 12.5 mg daily.  6. HTN: BP is on the low side now, no lightheadedness.  - She is off Coreg.  - She will stop amlodipine.   7. OSA: Continue CPAP qHS.  8. Lung nodule: Repeat CT to follow in 2/21.  9. CKD: Stage 3.  Check BMET 1 week.   10. Aortic stenosis: Mild on 4/20 echo.    Followup in 3 wks.   Signed, Loralie Champagne, MD  04/10/2019   [place] [address] [facility] [place] [address] [phone] (office) [phone] (fax)

## 2019-04-10 NOTE — Patient Instructions (Signed)
RESTART Adcirca 40mg  (1 tab) daily  RESTART Lasix 80mg  in the morning AND 40mg  in the evening (start tomorrow)  Take Tramadol 50mg  every 12hrs as needed for pain  STOP Amlodipine  Stay off [place] in 1 week We will only contact you if something comes back abnormal or we need to make some changes. Otherwise no news is good news!  Your physician recommends that you schedule a follow-up appointment in: 3 weeks with Dr Aundra Dubin.  At the [facility], you and your health needs are our priority. As part of our continuing mission to provide you with exceptional heart care, we have created designated Provider Care Teams. These Care Teams include your primary Cardiologist (physician) and Advanced Practice Providers (APPs- Physician Assistants and Nurse Practitioners) who all work together to provide you with the care you need, when you need it.   You may see any of the following providers on your designated Care Team at your next follow up: Marland Kitchen Dr Glori Bickers . Dr Loralie Champagne . Darrick Grinder, NP

## 2019-04-17 ENCOUNTER — Other Ambulatory Visit: Payer: Self-pay

## 2019-04-17 ENCOUNTER — Ambulatory Visit (HOSPITAL_COMMUNITY)
Admission: RE | Admit: 2019-04-17 | Discharge: 2019-04-17 | Disposition: A | Discharge status: Home / Self Care | Payer: Medicare Other | Source: Ambulatory Visit | Attending: Cardiology | Admitting: Cardiology

## 2019-04-17 DIAGNOSIS — I5032 Chronic diastolic (congestive) heart failure: Secondary | ICD-10-CM | POA: Insufficient documentation

## 2019-04-17 LAB — BASIC METABOLIC PANEL
Anion gap: 11 (ref 5–15)
BUN: 20 mg/dL (ref 8–23)
CO2: 19 mmol/L — ABNORMAL LOW (ref 22–32)
Calcium: 8.3 mg/dL — ABNORMAL LOW (ref 8.9–10.3)
Chloride: 110 mmol/L (ref 98–111)
Creatinine, Ser: 1.46 mg/dL — ABNORMAL HIGH (ref 0.44–1.00)
GFR calc Af Amer: 40 mL/min — ABNORMAL LOW (ref >60)
GFR calc non Af Amer: 35 mL/min — ABNORMAL LOW (ref >60)
Glucose, Bld: 205 mg/dL — ABNORMAL HIGH (ref 70–99)
Potassium: 3.7 mmol/L (ref 3.5–5.1)
Sodium: 140 mmol/L (ref 135–145)

## 2019-04-26 ENCOUNTER — Telehealth (HOSPITAL_COMMUNITY): Payer: Self-pay

## 2019-04-26 NOTE — Telephone Encounter (Signed)
Plan of care signed and faxed successfully to [facility] [phone]

## 2019-04-28 ENCOUNTER — Encounter (HOSPITAL_COMMUNITY): Payer: Self-pay

## 2019-05-10 ENCOUNTER — Other Ambulatory Visit: Payer: Self-pay

## 2019-05-10 ENCOUNTER — Encounter (HOSPITAL_COMMUNITY): Payer: Self-pay | Admitting: Cardiology

## 2019-05-10 ENCOUNTER — Ambulatory Visit (HOSPITAL_COMMUNITY)
Admission: RE | Admit: 2019-05-10 | Discharge: 2019-05-10 | Disposition: A | Discharge status: Home / Self Care | Payer: Medicare Other | Source: Ambulatory Visit | Attending: Cardiology | Admitting: Cardiology

## 2019-05-10 VITALS — BP 116/46 | HR 95 | Wt 170.8 lb

## 2019-05-10 DIAGNOSIS — D649 Anemia, unspecified: Secondary | ICD-10-CM

## 2019-05-10 DIAGNOSIS — K589 Irritable bowel syndrome without diarrhea: Secondary | ICD-10-CM | POA: Insufficient documentation

## 2019-05-10 DIAGNOSIS — I130 Hypertensive heart and chronic kidney disease with heart failure and stage 1 through stage 4 chronic kidney disease, or unspecified chronic kidney disease: Secondary | ICD-10-CM | POA: Insufficient documentation

## 2019-05-10 DIAGNOSIS — Z888 Allergy status to other drugs, medicaments and biological substances status: Secondary | ICD-10-CM | POA: Diagnosis not present

## 2019-05-10 DIAGNOSIS — I5032 Chronic diastolic (congestive) heart failure: Secondary | ICD-10-CM | POA: Insufficient documentation

## 2019-05-10 DIAGNOSIS — Z79899 Other long term (current) drug therapy: Secondary | ICD-10-CM | POA: Diagnosis not present

## 2019-05-10 DIAGNOSIS — Z79890 Hormone replacement therapy (postmenopausal): Secondary | ICD-10-CM | POA: Diagnosis not present

## 2019-05-10 DIAGNOSIS — G4733 Obstructive sleep apnea (adult) (pediatric): Secondary | ICD-10-CM | POA: Diagnosis not present

## 2019-05-10 DIAGNOSIS — I5189 Other ill-defined heart diseases: Secondary | ICD-10-CM

## 2019-05-10 DIAGNOSIS — Z86718 Personal history of other venous thrombosis and embolism: Secondary | ICD-10-CM | POA: Insufficient documentation

## 2019-05-10 DIAGNOSIS — I272 Pulmonary hypertension, unspecified: Secondary | ICD-10-CM | POA: Diagnosis not present

## 2019-05-10 DIAGNOSIS — E785 Hyperlipidemia, unspecified: Secondary | ICD-10-CM | POA: Diagnosis not present

## 2019-05-10 DIAGNOSIS — E1122 Type 2 diabetes mellitus with diabetic chronic kidney disease: Secondary | ICD-10-CM | POA: Insufficient documentation

## 2019-05-10 DIAGNOSIS — E039 Hypothyroidism, unspecified: Secondary | ICD-10-CM | POA: Insufficient documentation

## 2019-05-10 DIAGNOSIS — Z7984 Long term (current) use of oral hypoglycemic drugs: Secondary | ICD-10-CM | POA: Insufficient documentation

## 2019-05-10 DIAGNOSIS — N183 Chronic kidney disease, stage 3 (moderate): Secondary | ICD-10-CM | POA: Diagnosis not present

## 2019-05-10 LAB — CBC
HCT: 36.3 % (ref 36.0–46.0)
Hemoglobin: 11.5 g/dL — ABNORMAL LOW (ref 12.0–15.0)
MCH: 30.2 pg (ref 26.0–34.0)
MCHC: 31.7 g/dL (ref 30.0–36.0)
MCV: 95.3 fL (ref 80.0–100.0)
Platelets: 296 10*3/uL (ref 150–400)
RBC: 3.81 MIL/uL — ABNORMAL LOW (ref 3.87–5.11)
RDW: 15.3 % (ref 11.5–15.5)
WBC: 7.9 10*3/uL (ref 4.0–10.5)
nRBC: 0 % (ref 0.0–0.2)

## 2019-05-10 LAB — BASIC METABOLIC PANEL
Anion gap: 11 (ref 5–15)
BUN: 18 mg/dL (ref 8–23)
CO2: 19 mmol/L — ABNORMAL LOW (ref 22–32)
Calcium: 8.2 mg/dL — ABNORMAL LOW (ref 8.9–10.3)
Chloride: 107 mmol/L (ref 98–111)
Creatinine, Ser: 1.63 mg/dL — ABNORMAL HIGH (ref 0.44–1.00)
GFR calc Af Amer: 35 mL/min — ABNORMAL LOW (ref >60)
GFR calc non Af Amer: 30 mL/min — ABNORMAL LOW (ref >60)
Glucose, Bld: 192 mg/dL — ABNORMAL HIGH (ref 70–99)
Potassium: 3.2 mmol/L — ABNORMAL LOW (ref 3.5–5.1)
Sodium: 137 mmol/L (ref 135–145)

## 2019-05-10 LAB — BRAIN NATRIURETIC PEPTIDE: B Natriuretic Peptide: 540.6 pg/mL — ABNORMAL HIGH (ref 0.0–100.0)

## 2019-05-10 MED ORDER — FUROSEMIDE 80 MG PO TABS: 80.0000 mg | ORAL_TABLET | Freq: Two times a day (BID) | ORAL | 6 refills | Status: DC

## 2019-05-10 NOTE — Patient Instructions (Addendum)
INCREASE Lasix to 80mg  (1 tab) twice a day  Labs today and repeat in 10 days We will only contact you if something comes back abnormal or we need to make some changes. Otherwise no news is good news!  Your physician recommends that you schedule a follow-up appointment in: 6 weeks with Dr Aundra Dubin.  At the [facility], you and your health needs are our priority. As part of our continuing mission to provide you with exceptional heart care, we have created designated Provider Care Teams. These Care Teams include your primary Cardiologist (physician) and Advanced Practice Providers (APPs- Physician Assistants and Nurse Practitioners) who all work together to provide you with the care you need, when you need it.   You may see any of the following providers on your designated Care Team at your next follow up: Marland Kitchen Dr Glori Bickers . Dr Loralie Champagne . Darrick Grinder, NP   Please be sure to bring in all your medications bottles to every appointment.

## 2019-05-10 NOTE — Progress Notes (Signed)
6 minute walk: Pt ambulated total distance of 384meters. Pt tolerated well. HR range 91-120.  O2 range 96%-88%.  At completion of walk pt denied dizziness. Pt recovered to 97% O2 and HR 88.

## 2019-05-12 NOTE — Progress Notes (Signed)
Date:  05/12/2019   ID:  IYSHA MISHKIN, DOB 04-Nov-1940, MRN [id number]   Provider location: [facility] Type of Visit: Established patient   PCP:  Neila Gear, MD  Cardiologist:  Dr. Aundra Dubin  Chief Complaint: Shortness of breath   History of Present Illness: Alisha [name] is a 78 y.o. female with history of diabetes, HTN, PAD, hyperlipidemia who was referred by Dr. Wynonia Lawman for evaluation of pulmonary hypertension.   For > 1 year, she has had significant exertional dyspnea. It has been steadily worsening, especially over the last few months.  She has been extensively worked up so far.  Echo in 4/18 showed preserved EF 65% with moderate pulmonary hypertension.  She had episodes of syncope in 5/18 and 10/18.  She wore an event monitor in 5/18 with no significant arrhythmia.  LHC/RHC in 1/19 showed normal PCWP and severely elevated PA pressure, no significant CAD.    At initial appointment in 1/19, she was noted to be hypoxemic with exertion and home oxygen was started for use with exertion.  I also started her on Opsumit. She had to stop this after about a week due to significantly increased exertional peripheral edema.  This resolved after Opsumit was stopped.   Echo in 4/19.  This showed EF 60-65%, mild MS, mild AS, moderately dilated RV with mildly decreased systolic function.   She was found to have Fe deficiency anemia, FOBT+.  EGD showed gastritis and c-scope showed diverticulosis and polyps in 4/19.  She get IV Fe.  She is going to have a capsule endoscopy.   She tried to start ambrisentan, but developed worsening dyspnea and peripheral edema and low oxygen saturation.  This improved after ambrisentan was stopped.   She is now taking riociguat and Uptravi.  RHC in 4/20 showed normal filling pressures but severe pulmonary hypertension with PVR 6 WU.  Echo showed moderately dilated RV with normal systolic function, mild-moderate MS, mild AS.  [name] has  now been titrated up to 2400 mcg bid.   She returns for followup of diastolic CHF and pulmonary hypertension.  Weight is stable.  She is doing better overall.  Back to walking for 10-15 minutes daily outside.  She is short of breath walking fast but not with walking at a steady pace.  No problems with ADLs.  No chest pain, lightheadedness, orthopnea/PND. Sciatica symptoms have improved.  She still has lower leg edema.   6 minute walk (1/19): 92 m, oxygen saturation dropped to 70%.   6 minute walk (3/19): 122 m 6 minute walk (4/19): 392 m 6 minute walk (9/19): 317 m 6 minute walk (1/20): 320 m 6 minute walk (8/20): 359 m  Labs (1/19): anti-Jo1 negative, RF borderline positive, SCL-70 negative, HIV negative. ESR 21. BNP 1129.  Labs (3/19): ANA + => anti-dsDNA negative, SSA/SSB negative, anti-Sm negative, anti-RNP negative. K 3.8 => 3.7, creatinine 1.4 => 1.55.  Labs (4/19): K 4.1 => 3.1, creatinine 1.58 => 1.2, BNP 1902, CCP Ab negative.  Labs (6/19): hgb 9, creatinine 1.66, K 3 Labs (7/19): K 4.9, creatinine 1.7 Labs (8/19): K 3.6, creatinine 1.36 Labs (9/19): K 4.1, creatinine 1.55, BNP 641 Labs (10/19): K 4.7, creatinine 1.88, BNP 595 Labs (1/20): Hgb 12.2 Labs (4/20): K 4.2, creatinine 2.2, hgb 8.4 Labs (7/20): K 3.2, creatinine 2.07, hgb 8 Labs (8/20): K 3.7, creatinine 1.46  ECG (personally reviewed): NSR, right axis deviation.   PMH: 1. PAD: Non-healing left ankle ulcer.  -  ABIs 9/12 were normal.  2. Type 2 diabetes 3. Hypothyroidism 4. CKD stage 3 5. Hyperlipidemia 6. HTN 7. IBS 8. OSA: on CPAP.  9. Event monitor 5/18: No significant abnormality.  10. DVT in 1995 11. Pulmonary hypertension: Echo (4/18) with EF 65%, mild MR, mild TR, mild mitral stenosis, moderate pulmonary hypertension. - CTA chest 10/17: No PE.  - RHC (1/19): mean RA 11, PA 87/31 mean 51, mean PCWP 6, CI 3.58, PVR 7 WU.  - PFTs (1/19): mild obstruction, some restriction => severely decreased  DLCO. - High resolution CT chest (1/19): no interstitial lung disease or emphysema. 7 mm nodule RUL.  - anti-Jo1 negative, RF borderline positive, SCL-70 negative, HIV negative. ANA + => anti-dsDNA negative, SSA/SSB negative, anti-Sm negative, anti-RNP negative. - V/Q scan 2/19: No e/o chronic PE.  - Echo (4/19): EF 60-65%, mild LVH, mild aortic stenosis, mild mitral stenosis, moderately dilated RV with mildly decreased systolic function, PASP 36 mmHg (poor envelope, may be underestimated).  - Does not tolerate ERAs (macitentan, ambrisentan).  - RHC (4/20): mean RA 5, PA 85/22 mean 51, mean PCWP 12, CI  3.5, PVR 6 WU.  - Echo (4/20): EF > 65%, moderate LVH, RV moderately dilated with normal systolic function, moderate pericardial effusion, mild-moderate mitral stenosis with mean gradient 7/MVA by PHT 2.48 cm^2, mild AS mean gradient 12.  12. Coronary angiography 11/18 without significant disease.  - Coronary angiography in 1/19 with luminal irregularities 13. Mitral stenosis: Mild by cath in 1/19, mean gradient 2.6 mmHg. Mild on 4/19 echo.  - Mild-moderate by echo in 4/20.  14. Aortic stenosis: Mild on 4/19 echo.  - Mild on 4/20 echo.  15. Fe deficiency anemia: EGD showed gastritis and c-scope showed diverticulosis and polyps in 4/19. Capsule endoscopy was unrevealing.  16. Pulmonary nodule: CT chest 7/19 with RUL nodule.  17. Low back pain/sciatica  Current Outpatient Medications  Medication Sig Dispense Refill   atorvastatin (LIPITOR) 20 MG tablet Take 20 mg by mouth every morning.     cetirizine (ZYRTEC) 10 MG tablet Take 10 mg by mouth daily as needed for allergies.     fenofibrate 160 MG tablet Take 160 mg by mouth every morning.     ferrous sulfate 325 (65 FE) MG tablet Take 325 mg by mouth daily with breakfast. Morning and night     furosemide (LASIX) 80 MG tablet Take 1 tablet (80 mg total) by mouth 2 (two) times daily. 60 tablet 6   glimepiride (AMARYL) 4 MG tablet Take 4 mg  by mouth daily with breakfast.     levothyroxine (SYNTHROID, LEVOTHROID) 125 MCG tablet Take 125 mcg by mouth daily before breakfast.     loperamide (IMODIUM A-D) 2 MG tablet Take 2 mg by mouth 4 (four) times daily as needed for diarrhea or loose stools.     loratadine (CLARITIN) 10 MG tablet Take 10 mg by mouth daily as needed for allergies.      losartan (COZAAR) 100 MG tablet Take 100 mg by mouth daily.     metolazone (ZAROXOLYN) 2.5 MG tablet Take 1 tablet (2.5 mg total) by mouth once a week. Every Saturday 10 tablet 6   Multiple Vitamins-Minerals (PRESERVISION AREDS 2 PO) Take 1 capsule by mouth 2 (two) times daily.     pantoprazole (PROTONIX) 40 MG tablet Take 40 mg by mouth every morning.     potassium chloride (KLOR-CON M10) 10 MEQ tablet Take 2 tabs in AM and 1 tab in PM, take  an extra tab on Saturdays 90 tablet 3   Riociguat (ADEMPAS) 1 MG TABS Take by mouth 3 (three) times daily.     Selexipag 1600 MCG TABS Take 2,400 mcg by mouth 2 (two) times a day. Pt will take one 814mg and one 16061m twice a day. [facility] 60 tablet 11   spironolactone (ALDACTONE) 25 MG tablet TAKE 1/2 TABLET BY MOUTH EVERY DAY (Patient taking differently: Take 12.5 mg by mouth as needed. ) 45 tablet 1   traMADol (ULTRAM) 50 MG tablet Take 1 tablet (50 mg total) by mouth every 12 (twelve) hours as needed for moderate pain. 20 tablet 0   No current facility-administered medications for this encounter.       Allergies:   Other and Adhesive [tape]   Social History:  The patient  reports that she has never smoked. She has never used smokeless tobacco. She reports that she does not drink alcohol or use drugs.   Family History:  The patient's family history is not on file.   ROS:  Please see the history of present illness.   All other systems are personally reviewed and negative.   Exam:   BP (!) 116/46    Pulse 95    Wt 77.5 kg (170 lb 12.8 oz)    SpO2 95%    BMI 27.99 kg/m   General: NAD Neck: JVP 8 cm, no thyromegaly or thyroid nodule.  Lungs: Clear to auscultation bilaterally with normal respiratory effort. CV: Nondisplaced PMI.  Heart regular S1/S2, no S3/S4, 2/6 SEM RUSB.  1+ edema to knees.  No carotid bruit.  Normal pedal pulses.  Abdomen: Soft, nontender, no hepatosplenomegaly, mild abdominal distention.  Skin: Intact without lesions or rashes.  Neurologic: Alert and oriented x 3.  Psych: Normal affect. Extremities: No clubbing or cyanosis.  HEENT: Normal.   Recent Labs: 05/10/2019: B Natriuretic Peptide 540.6; BUN 18; Creatinine, Ser 1.63; Hemoglobin 11.5; Platelets 296; Potassium 3.2; Sodium 137  Personally reviewed   Wt Readings from Last 3 Encounters:  05/10/19 77.5 kg (170 lb 12.8 oz)  04/10/19 76.4 kg (168 lb 6.4 oz)  01/01/19 73.5 kg (162 lb)      ASSESSMENT AND PLAN:  1. Pulmonary hypertension:  Severe PAH with PVR 7 WU on 1/19 RHC.  Etiology uncertain.  She has a history of DVT remotely but V/Q scan in 2/19 did not show evidence for chronic PE. She has OSA and uses CPAP.  Serologic workup showed only positive ANA but when additional serologies were done, they were all negative (see above).  CT chest with no ILD or emphysema.  Valvular heart disease does not appear significant enough to cause this degree of PH.  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  Echo in 4/19 showed that the RV is moderately dilated with mildly decreased systolic function, PA systolic pressure estimation was only 36 mmHg but poor envelope and likely underestimated. She did not tolerate Opsumit or ambrisentan due to significant lower extremity edema and worsening dyspnea.  RHHoltn 4/20 showed severe pulmonary hypertension with normal filling pressures and preserved cardiac output.  Echo in 4/20 showed moderate RV dilation with normal systolic function. NYHA class II symptoms.  Mildly improved 6 minute walk today.    - She has started Adempas, will continue to uptitrate.  No side effects so  far. - Malvin Johnss up to 2400 mcg BID.  2. Syncope: Suspect related to severe pulmonary hypertension.  None recently.   3. Mitral stenosis: Noted  on echo and cath, mild-moderate.  No change.  4. H/o DVT: No chronic PE on V/Q scan.   5. Chronic diastolic CHF/RV failure: In setting of PAH and some degree of cor pulmonale.  NYHA class II symptoms, mild volume overload on exam. - Increase Lasix to 80 mg bid.  BMET/BNP today, BMET in 10 days.  - Continue metolazone 2.5 qSaturday.  She will take KCl 40 bid on metolazone days.   - Continue spironolactone 12.5 mg daily.  6. HTN: BP stable, no longer on antihypertensives.  7. OSA: Continue CPAP qHS.  8. Lung nodule: Repeat CT to follow in 2/21.  9. CKD: Stage 3.  BMET today.   10. Aortic stenosis: Mild on 4/20 echo.    Followup in 6 wks.   Signed, Loralie Champagne, MD  05/12/2019   [facility] [address] [facility] [place] [address] [phone] (office) [phone] (fax)

## 2019-05-21 ENCOUNTER — Ambulatory Visit (HOSPITAL_COMMUNITY)
Admission: RE | Admit: 2019-05-21 | Discharge: 2019-05-21 | Disposition: A | Discharge status: Home / Self Care | Payer: Medicare Other | Source: Ambulatory Visit | Attending: Internal Medicine | Admitting: Internal Medicine

## 2019-05-21 ENCOUNTER — Other Ambulatory Visit: Payer: Self-pay

## 2019-05-21 DIAGNOSIS — I5189 Other ill-defined heart diseases: Secondary | ICD-10-CM | POA: Diagnosis not present

## 2019-05-21 LAB — BASIC METABOLIC PANEL
Anion gap: 10 (ref 5–15)
BUN: 26 mg/dL — ABNORMAL HIGH (ref 8–23)
CO2: 22 mmol/L (ref 22–32)
Calcium: 8.9 mg/dL (ref 8.9–10.3)
Chloride: 105 mmol/L (ref 98–111)
Creatinine, Ser: 1.53 mg/dL — ABNORMAL HIGH (ref 0.44–1.00)
GFR calc Af Amer: 38 mL/min — ABNORMAL LOW (ref >60)
GFR calc non Af Amer: 33 mL/min — ABNORMAL LOW (ref >60)
Glucose, Bld: 136 mg/dL — ABNORMAL HIGH (ref 70–99)
Potassium: 4 mmol/L (ref 3.5–5.1)
Sodium: 137 mmol/L (ref 135–145)

## 2019-06-10 ENCOUNTER — Telehealth (HOSPITAL_COMMUNITY): Payer: Self-pay

## 2019-06-10 NOTE — Telephone Encounter (Signed)
[place] orders faxed to [facility].

## 2019-06-21 ENCOUNTER — Other Ambulatory Visit (HOSPITAL_COMMUNITY): Payer: Self-pay | Admitting: Cardiology

## 2019-06-27 ENCOUNTER — Ambulatory Visit (HOSPITAL_COMMUNITY)
Admission: RE | Admit: 2019-06-27 | Discharge: 2019-06-27 | Disposition: A | Discharge status: Home / Self Care | Payer: Medicare Other | Source: Ambulatory Visit | Attending: Cardiology | Admitting: Cardiology

## 2019-06-27 ENCOUNTER — Encounter (HOSPITAL_COMMUNITY): Payer: Self-pay | Admitting: Cardiology

## 2019-06-27 ENCOUNTER — Other Ambulatory Visit: Payer: Self-pay

## 2019-06-27 VITALS — BP 128/62 | HR 88 | Wt 167.8 lb

## 2019-06-27 DIAGNOSIS — E1151 Type 2 diabetes mellitus with diabetic peripheral angiopathy without gangrene: Secondary | ICD-10-CM | POA: Insufficient documentation

## 2019-06-27 DIAGNOSIS — I272 Pulmonary hypertension, unspecified: Secondary | ICD-10-CM

## 2019-06-27 DIAGNOSIS — G4733 Obstructive sleep apnea (adult) (pediatric): Secondary | ICD-10-CM | POA: Diagnosis not present

## 2019-06-27 DIAGNOSIS — E039 Hypothyroidism, unspecified: Secondary | ICD-10-CM | POA: Insufficient documentation

## 2019-06-27 DIAGNOSIS — Z79890 Hormone replacement therapy (postmenopausal): Secondary | ICD-10-CM | POA: Diagnosis not present

## 2019-06-27 DIAGNOSIS — N183 Chronic kidney disease, stage 3 unspecified: Secondary | ICD-10-CM | POA: Diagnosis not present

## 2019-06-27 DIAGNOSIS — E1122 Type 2 diabetes mellitus with diabetic chronic kidney disease: Secondary | ICD-10-CM | POA: Insufficient documentation

## 2019-06-27 DIAGNOSIS — I1310 Hypertensive heart and chronic kidney disease without heart failure, with stage 1 through stage 4 chronic kidney disease, or unspecified chronic kidney disease: Secondary | ICD-10-CM | POA: Insufficient documentation

## 2019-06-27 DIAGNOSIS — I5189 Other ill-defined heart diseases: Secondary | ICD-10-CM | POA: Diagnosis not present

## 2019-06-27 DIAGNOSIS — Z79899 Other long term (current) drug therapy: Secondary | ICD-10-CM | POA: Insufficient documentation

## 2019-06-27 LAB — BASIC METABOLIC PANEL
Anion gap: 13 (ref 5–15)
BUN: 34 mg/dL — ABNORMAL HIGH (ref 8–23)
CO2: 20 mmol/L — ABNORMAL LOW (ref 22–32)
Calcium: 8.9 mg/dL (ref 8.9–10.3)
Chloride: 105 mmol/L (ref 98–111)
Creatinine, Ser: 1.75 mg/dL — ABNORMAL HIGH (ref 0.44–1.00)
GFR calc Af Amer: 32 mL/min — ABNORMAL LOW (ref >60)
GFR calc non Af Amer: 28 mL/min — ABNORMAL LOW (ref >60)
Glucose, Bld: 170 mg/dL — ABNORMAL HIGH (ref 70–99)
Potassium: 4.5 mmol/L (ref 3.5–5.1)
Sodium: 138 mmol/L (ref 135–145)

## 2019-06-27 LAB — CBC
HCT: 37.2 % (ref 36.0–46.0)
Hemoglobin: 11.8 g/dL — ABNORMAL LOW (ref 12.0–15.0)
MCH: 30.3 pg (ref 26.0–34.0)
MCHC: 31.7 g/dL (ref 30.0–36.0)
MCV: 95.6 fL (ref 80.0–100.0)
Platelets: 327 10*3/uL (ref 150–400)
RBC: 3.89 MIL/uL (ref 3.87–5.11)
RDW: 15 % (ref 11.5–15.5)
WBC: 7.8 10*3/uL (ref 4.0–10.5)
nRBC: 0 % (ref 0.0–0.2)

## 2019-06-27 NOTE — Patient Instructions (Addendum)
Labs today We will only contact you if something comes back abnormal or we need to make some changes. Otherwise no news is good news!  NO medication changes today  Your physician recommends that you schedule a follow-up appointment in: 3 months with Dr Aundra Dubin, our office will call you to schedule this appointment.   At the [facility], you and your health needs are our priority. As part of our continuing mission to provide you with exceptional heart care, we have created designated Provider Care Teams. These Care Teams include your primary Cardiologist (physician) and Advanced Practice Providers (APPs- Physician Assistants and Nurse Practitioners) who all work together to provide you with the care you need, when you need it.   You may see any of the following providers on your designated Care Team at your next follow up: Marland Kitchen Dr Glori Bickers . Dr Loralie Champagne . Darrick Grinder, NP . Lyda Jester, PA   Please be sure to bring in all your medications bottles to every appointment.

## 2019-06-27 NOTE — Progress Notes (Signed)
6MW walk completed. Patient ambulated 1080 ft (391.50meters) HR ranged 84-120  o2 sats ranged 89-96% on room air

## 2019-06-28 NOTE — Progress Notes (Signed)
Date:  06/28/2019   ID:  BRAYAH URQUILLA, DOB 1941/05/04, MRN [id number]   Provider location: [facility] Type of Visit: Established patient   PCP:  Neila Gear, MD  Cardiologist:  Dr. Aundra Dubin  Chief Complaint: Shortness of breath   History of Present Illness: Alisha [name] is a 78 y.o. female with history of diabetes, HTN, PAD, hyperlipidemia who was referred by Dr. Wynonia Lawman for evaluation of pulmonary hypertension.   For > 1 year, she has had significant exertional dyspnea. It has been steadily worsening, especially over the last few months.  She has been extensively worked up so far.  Echo in 4/18 showed preserved EF 65% with moderate pulmonary hypertension.  She had episodes of syncope in 5/18 and 10/18.  She wore an event monitor in 5/18 with no significant arrhythmia.  LHC/RHC in 1/19 showed normal PCWP and severely elevated PA pressure, no significant CAD.    At initial appointment in 1/19, she was noted to be hypoxemic with exertion and home oxygen was started for use with exertion.  I also started her on Opsumit. She had to stop this after about a week due to significantly increased exertional peripheral edema.  This resolved after Opsumit was stopped.   Echo in 4/19.  This showed EF 60-65%, mild MS, mild AS, moderately dilated RV with mildly decreased systolic function.   She was found to have Fe deficiency anemia, FOBT+.  EGD showed gastritis and c-scope showed diverticulosis and polyps in 4/19.  She get IV Fe.  She is going to have a capsule endoscopy.   She tried to start ambrisentan, but developed worsening dyspnea and peripheral edema and low oxygen saturation.  This improved after ambrisentan was stopped.   She is now taking riociguat and Uptravi.  RHC in 4/20 showed normal filling pressures but severe pulmonary hypertension with PVR 6 WU.  Echo showed moderately dilated RV with normal systolic function, mild-moderate MS, mild AS.  Alisha [name]  has now been titrated up to 2400 mcg bid and riociguat to 1.5 mg tid.    She returns for followup of diastolic CHF and pulmonary hypertension.  She has been doing well overall.  Weight down 3 lbs.  She is not taking metolazone regularly.  She walks for 15-20 minutes without dyspnea.  Still some shortness of breath with stairs.  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  No chest pain.  No lightheadedness.  No orthopnea/PND.  Main complaint is sciatica.   6 minute walk (1/19): 92 m, oxygen saturation dropped to 70%.   6 minute walk (3/19): 122 m 6 minute walk (4/19): 392 m 6 minute walk (9/19): 317 m 6 minute walk (1/20): 320 m 6 minute walk (8/20): 359 m 6 minute walk (10/20): 391 m  Labs (1/19): anti-Jo1 negative, RF borderline positive, SCL-70 negative, HIV negative. ESR 21. BNP 1129.  Labs (3/19): ANA + => anti-dsDNA negative, SSA/SSB negative, anti-Sm negative, anti-RNP negative. K 3.8 => 3.7, creatinine 1.4 => 1.55.  Labs (4/19): K 4.1 => 3.1, creatinine 1.58 => 1.2, BNP 1902, CCP Ab negative.  Labs (6/19): hgb 9, creatinine 1.66, K 3 Labs (7/19): K 4.9, creatinine 1.7 Labs (8/19): K 3.6, creatinine 1.36 Labs (9/19): K 4.1, creatinine 1.55, BNP 641 Labs (10/19): K 4.7, creatinine 1.88, BNP 595 Labs (1/20): Hgb 12.2 Labs (4/20): K 4.2, creatinine 2.2, hgb 8.4 Labs (7/20): K 3.2, creatinine 2.07, hgb 8 Labs (8/20): K 3.7, creatinine  1.46 Labs (9/20): K 4, creatinine 1.5  PMH: 1. PAD: Non-healing left ankle ulcer.  - ABIs 9/12 were normal.  2. Type 2 diabetes 3. Hypothyroidism 4. CKD stage 3 5. Hyperlipidemia 6. HTN 7. IBS 8. OSA: on CPAP.  9. Event monitor 5/18: No significant abnormality.  10. DVT in 1995 11. Pulmonary hypertension: Echo (4/18) with EF 65%, mild MR, mild TR, mild mitral stenosis, moderate pulmonary hypertension. - CTA chest 10/17: No PE.  - RHC (1/19): mean RA 11, PA 87/31 mean 51, mean PCWP 6, CI 3.58, PVR 7 WU.  - PFTs  (1/19): mild obstruction, some restriction => severely decreased DLCO. - High resolution CT chest (1/19): no interstitial lung disease or emphysema. 7 mm nodule RUL.  - anti-Jo1 negative, RF borderline positive, SCL-70 negative, HIV negative. ANA + => anti-dsDNA negative, SSA/SSB negative, anti-Sm negative, anti-RNP negative. - V/Q scan 2/19: No e/o chronic PE.  - Echo (4/19): EF 60-65%, mild LVH, mild aortic stenosis, mild mitral stenosis, moderately dilated RV with mildly decreased systolic function, PASP 36 mmHg (poor envelope, may be underestimated).  - Does not tolerate ERAs (macitentan, ambrisentan).  - RHC (4/20): mean RA 5, PA 85/22 mean 51, mean PCWP 12, CI  3.5, PVR 6 WU.  - Echo (4/20): EF > 65%, moderate LVH, RV moderately dilated with normal systolic function, moderate pericardial effusion, mild-moderate mitral stenosis with mean gradient 7/MVA by PHT 2.48 cm^2, mild AS mean gradient 12.  12. Coronary angiography 11/18 without significant disease.  - Coronary angiography in 1/19 with luminal irregularities 13. Mitral stenosis: Mild by cath in 1/19, mean gradient 2.6 mmHg. Mild on 4/19 echo.  - Mild-moderate by echo in 4/20.  14. Aortic stenosis: Mild on 4/19 echo.  - Mild on 4/20 echo.  15. Fe deficiency anemia: EGD showed gastritis and c-scope showed diverticulosis and polyps in 4/19. Capsule endoscopy was unrevealing.  16. Pulmonary nodule: CT chest 7/19 with RUL nodule.  17. Low back pain/sciatica  Current Outpatient Medications  Medication Sig Dispense Refill   atorvastatin (LIPITOR) 20 MG tablet Take 20 mg by mouth every morning.     cetirizine (ZYRTEC) 10 MG tablet Take 10 mg by mouth daily as needed for allergies.     fenofibrate 160 MG tablet Take 160 mg by mouth every morning.     ferrous sulfate 325 (65 FE) MG tablet Take 325 mg by mouth daily with breakfast. Morning and night     furosemide (LASIX) 80 MG tablet Take 1 tablet (80 mg total) by mouth 2 (two) times  daily. 60 tablet 6   glimepiride (AMARYL) 4 MG tablet Take 4 mg by mouth daily with breakfast.     levothyroxine (SYNTHROID, LEVOTHROID) 125 MCG tablet Take 125 mcg by mouth daily before breakfast.     loperamide (IMODIUM A-D) 2 MG tablet Take 2 mg by mouth 4 (four) times daily as needed for diarrhea or loose stools.     loratadine (CLARITIN) 10 MG tablet Take 10 mg by mouth daily as needed for allergies.      losartan (COZAAR) 100 MG tablet Take 100 mg by mouth daily.     Multiple Vitamins-Minerals (PRESERVISION AREDS 2 PO) Take 1 capsule by mouth 2 (two) times daily.     pantoprazole (PROTONIX) 40 MG tablet Take 40 mg by mouth every morning.     potassium chloride (KLOR-CON) 10 MEQ tablet Take 10 mEq by mouth daily.     Riociguat (ADEMPAS) 1 MG TABS Take by mouth  3 (three) times daily.     Selexipag 1600 MCG TABS Take 2,400 mcg by mouth 2 (two) times a day. Pt will take one 852mg and one 16057m twice a day. [facility] 60 tablet 11   spironolactone (ALDACTONE) 25 MG tablet TAKE 1/2 TABLET BY MOUTH EVERY DAY 45 tablet 1   traMADol (ULTRAM) 50 MG tablet Take 1 tablet (50 mg total) by mouth every 12 (twelve) hours as needed for moderate pain. 20 tablet 0   metolazone (ZAROXOLYN) 2.5 MG tablet Take 1 tablet (2.5 mg total) by mouth once a week. Every Saturday (Patient not taking: Reported on 06/27/2019) 10 tablet 6   No current facility-administered medications for this encounter.       Allergies:   Other and Adhesive [tape]   Social History:  The patient  reports that she has never smoked. She has never used smokeless tobacco. She reports that she does not drink alcohol or use drugs.   Family History:  The patient's family history is not on file.   ROS:  Please see the history of present illness.   All other systems are personally reviewed and negative.   Exam:   BP 128/62    Pulse 88    Wt 76.1 kg (167 lb 12.8 oz)    SpO2 98%    BMI 27.50 kg/m  General:  NAD Neck: JVP 7-8 cm, no thyromegaly or thyroid nodule.  Lungs: Clear to auscultation bilaterally with normal respiratory effort. CV: Nondisplaced PMI.  Heart regular S1/S2, no S3/S4, 2/6 SEM RUSB with clear S2.  1+ edema 1/2 to knees bilaterally.  No carotid bruit.  Normal pedal pulses.  Abdomen: Soft, nontender, no hepatosplenomegaly, no distention.  Skin: Intact without lesions or rashes.  Neurologic: Alert and oriented x 3.  Psych: Normal affect. Extremities: No clubbing or cyanosis.  HEENT: Normal.   Recent Labs: 05/10/2019: B Natriuretic Peptide 540.6 06/27/2019: BUN 34; Creatinine, Ser 1.75; Hemoglobin 11.8; Platelets 327; Potassium 4.5; Sodium 138  Personally reviewed   Wt Readings from Last 3 Encounters:  06/27/19 76.1 kg (167 lb 12.8 oz)  05/10/19 77.5 kg (170 lb 12.8 oz)  04/10/19 76.4 kg (168 lb 6.4 oz)      ASSESSMENT AND PLAN:  1. Pulmonary hypertension:  Severe PAH with PVR 7 WU on 1/19 RHC.  Etiology uncertain.  She has a history of DVT remotely but V/Q scan in 2/19 did not show evidence for chronic PE. She has OSA and uses CPAP.  Serologic workup showed only positive ANA but when additional serologies were done, they were all negative (see above).  CT chest with no ILD or emphysema.  Valvular heart disease does not appear significant enough to cause this degree of PH.  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  Echo in 4/19 showed that the RV is moderately dilated with mildly decreased systolic function, PA systolic pressure estimation was only 36 mmHg but poor envelope and likely underestimated. She did not tolerate Opsumit or ambrisentan due to significant lower extremity edema and worsening dyspnea.  [place] 4[place]20 showed severe pulmonary hypertension with normal filling pressures and preserved cardiac output.  Echo in 4/20 showed moderate RV dilation with normal systolic function. She is symptomatically better recently.  Mildly improved 6 minute walk today.    - She has started Adempas, will  continue to uptitrate (going to 2 mg tid next week).  No side effects so far. - Alisha Johnss up to 2400 mcg BID.  2. Syncope: Suspect  related to severe pulmonary hypertension.  None recently.   3. Mitral stenosis: Noted on echo and cath, mild-moderate.  No change.  4. H/o DVT: No chronic PE on V/Q scan.   5. Chronic diastolic CHF/RV failure: In setting of PAH and some degree of cor pulmonale.  NYHA class II symptoms, minimal volume overload on exam.  - Continue Lasix 80 mg bid with BMET today.   - She has not been using metolazone regularly, can keep for prn use.   - Continue spironolactone 12.5 mg daily.  6. HTN: BP stable - Continue losartan and spironolactone.  7. OSA: Continue CPAP qHS.  8. Lung nodule: Repeat CT to follow in 2/21.  9. CKD: Stage 3.  BMET today.   10. Aortic stenosis: Mild on 4/20 echo.    Followup in 3 months.   Signed, Loralie Champagne, MD  06/28/2019   [place] [address] [facility] [place] [address] [phone] (office) [phone] (fax)

## 2019-07-05 DIAGNOSIS — I270 Primary pulmonary hypertension: Secondary | ICD-10-CM | POA: Diagnosis not present

## 2019-08-28 ENCOUNTER — Telehealth (HOSPITAL_COMMUNITY): Payer: Self-pay | Admitting: Pharmacist

## 2019-08-28 ENCOUNTER — Other Ambulatory Visit (HOSPITAL_COMMUNITY): Payer: Self-pay

## 2019-08-28 MED ORDER — RIOCIGUAT 2.5 MG PO TABS: 2.5000 mg | ORAL_TABLET | Freq: Three times a day (TID) | ORAL | 0 refills | Status: DC

## 2019-08-28 NOTE — Telephone Encounter (Signed)
Patient Advocate Encounter   Received notification from [employer] that prior authorization for Adempas is required.   PA submitted on CoverMyMeds Key [id number] Status is pending   Will continue to follow.  Will also update medication to reflect current dose of Adempas as 2.5 mg TID (confirmed with [facility]).  Audry Riles, PharmD, BCPS, BCCP, CPP [facility] Pharmacist [phone]

## 2019-08-28 NOTE — Telephone Encounter (Signed)
Advanced Heart Failure Patient Advocate Encounter  Prior Authorization for Adempas has been approved.   PA# -[id number]  Effective dates: 08/28/19 through 09/11/20  Audry Riles, PharmD, BCPS, BCCP, CPP [facility] Clinic Pharmacist [phone]

## 2019-09-22 ENCOUNTER — Other Ambulatory Visit (HOSPITAL_COMMUNITY): Payer: Self-pay | Admitting: Cardiology

## 2019-09-26 ENCOUNTER — Telehealth (HOSPITAL_COMMUNITY): Payer: Self-pay

## 2019-09-26 ENCOUNTER — Ambulatory Visit: Payer: Medicare Other

## 2019-09-26 ENCOUNTER — Telehealth (HOSPITAL_COMMUNITY): Payer: Self-pay | Admitting: Pharmacist

## 2019-09-26 NOTE — Telephone Encounter (Signed)
Patient Advocate Encounter   Received notification from [facility] that prior authorization for Alisha [name] is required.   PA submitted on CoverMyMeds Key [id number] Status is pending   Will continue to follow.  Audry Riles, PharmD, BCPS, BCCP, CPP [facility] Pharmacist [phone]

## 2019-09-26 NOTE — Telephone Encounter (Signed)
LMOM for Pre Covid Screening. 

## 2019-09-27 ENCOUNTER — Ambulatory Visit (HOSPITAL_COMMUNITY)
Admission: RE | Admit: 2019-09-27 | Discharge: 2019-09-27 | Disposition: A | Discharge status: Home / Self Care | Payer: Medicare Other | Source: Ambulatory Visit | Attending: Cardiology | Admitting: Cardiology

## 2019-09-27 ENCOUNTER — Other Ambulatory Visit: Payer: Self-pay

## 2019-09-27 ENCOUNTER — Encounter (HOSPITAL_COMMUNITY): Payer: Self-pay | Admitting: Cardiology

## 2019-09-27 VITALS — BP 116/58 | HR 87 | Wt 171.2 lb

## 2019-09-27 DIAGNOSIS — K589 Irritable bowel syndrome without diarrhea: Secondary | ICD-10-CM | POA: Diagnosis not present

## 2019-09-27 DIAGNOSIS — I130 Hypertensive heart and chronic kidney disease with heart failure and stage 1 through stage 4 chronic kidney disease, or unspecified chronic kidney disease: Secondary | ICD-10-CM | POA: Insufficient documentation

## 2019-09-27 DIAGNOSIS — R55 Syncope and collapse: Secondary | ICD-10-CM | POA: Insufficient documentation

## 2019-09-27 DIAGNOSIS — G4733 Obstructive sleep apnea (adult) (pediatric): Secondary | ICD-10-CM | POA: Insufficient documentation

## 2019-09-27 DIAGNOSIS — I5032 Chronic diastolic (congestive) heart failure: Secondary | ICD-10-CM | POA: Insufficient documentation

## 2019-09-27 DIAGNOSIS — Z7984 Long term (current) use of oral hypoglycemic drugs: Secondary | ICD-10-CM | POA: Diagnosis not present

## 2019-09-27 DIAGNOSIS — I272 Pulmonary hypertension, unspecified: Secondary | ICD-10-CM | POA: Diagnosis not present

## 2019-09-27 DIAGNOSIS — Z86718 Personal history of other venous thrombosis and embolism: Secondary | ICD-10-CM | POA: Diagnosis not present

## 2019-09-27 DIAGNOSIS — Z79899 Other long term (current) drug therapy: Secondary | ICD-10-CM | POA: Diagnosis not present

## 2019-09-27 DIAGNOSIS — Z79890 Hormone replacement therapy (postmenopausal): Secondary | ICD-10-CM | POA: Diagnosis not present

## 2019-09-27 DIAGNOSIS — N183 Chronic kidney disease, stage 3 unspecified: Secondary | ICD-10-CM | POA: Insufficient documentation

## 2019-09-27 DIAGNOSIS — E039 Hypothyroidism, unspecified: Secondary | ICD-10-CM | POA: Insufficient documentation

## 2019-09-27 DIAGNOSIS — E1122 Type 2 diabetes mellitus with diabetic chronic kidney disease: Secondary | ICD-10-CM | POA: Diagnosis not present

## 2019-09-27 DIAGNOSIS — I350 Nonrheumatic aortic (valve) stenosis: Secondary | ICD-10-CM | POA: Insufficient documentation

## 2019-09-27 DIAGNOSIS — E785 Hyperlipidemia, unspecified: Secondary | ICD-10-CM | POA: Insufficient documentation

## 2019-09-27 DIAGNOSIS — D509 Iron deficiency anemia, unspecified: Secondary | ICD-10-CM | POA: Diagnosis not present

## 2019-09-27 DIAGNOSIS — R911 Solitary pulmonary nodule: Secondary | ICD-10-CM

## 2019-09-27 LAB — IRON AND TIBC
Iron: 40 ug/dL (ref 28–170)
Saturation Ratios: 12 % (ref 10.4–31.8)
TIBC: 330 ug/dL (ref 250–450)
UIBC: 290 ug/dL

## 2019-09-27 LAB — BASIC METABOLIC PANEL
Anion gap: 11 (ref 5–15)
BUN: 45 mg/dL — ABNORMAL HIGH (ref 8–23)
CO2: 20 mmol/L — ABNORMAL LOW (ref 22–32)
Calcium: 8.4 mg/dL — ABNORMAL LOW (ref 8.9–10.3)
Chloride: 110 mmol/L (ref 98–111)
Creatinine, Ser: 1.84 mg/dL — ABNORMAL HIGH (ref 0.44–1.00)
GFR calc Af Amer: 30 mL/min — ABNORMAL LOW (ref >60)
GFR calc non Af Amer: 26 mL/min — ABNORMAL LOW (ref >60)
Glucose, Bld: 133 mg/dL — ABNORMAL HIGH (ref 70–99)
Potassium: 3.9 mmol/L (ref 3.5–5.1)
Sodium: 141 mmol/L (ref 135–145)

## 2019-09-27 LAB — CBC
HCT: 27.8 % — ABNORMAL LOW (ref 36.0–46.0)
Hemoglobin: 8.2 g/dL — ABNORMAL LOW (ref 12.0–15.0)
MCH: 30.8 pg (ref 26.0–34.0)
MCHC: 29.5 g/dL — ABNORMAL LOW (ref 30.0–36.0)
MCV: 104.5 fL — ABNORMAL HIGH (ref 80.0–100.0)
Platelets: 357 10*3/uL (ref 150–400)
RBC: 2.66 MIL/uL — ABNORMAL LOW (ref 3.87–5.11)
RDW: 16.4 % — ABNORMAL HIGH (ref 11.5–15.5)
WBC: 7.9 10*3/uL (ref 4.0–10.5)
nRBC: 0 % (ref 0.0–0.2)

## 2019-09-27 LAB — FERRITIN: Ferritin: 68 ng/mL (ref 11–307)

## 2019-09-27 MED ORDER — FUROSEMIDE 80 MG PO TABS: ORAL_TABLET | ORAL | 6 refills | Status: DC

## 2019-09-27 MED ORDER — METOLAZONE 2.5 MG PO TABS: 2.5000 mg | ORAL_TABLET | ORAL | 6 refills | Status: DC

## 2019-09-27 NOTE — Telephone Encounter (Signed)
Advanced Heart Failure Patient Advocate Encounter  Prior Authorization for Alisha [name] has been approved.    PA# [id number] Effective dates: 09/26/19 through 09/11/20  Audry Riles, PharmD, BCPS, BCCP, CPP [facility] Clinic Pharmacist [phone]

## 2019-09-27 NOTE — Patient Instructions (Signed)
INCREASE Lasix to 120mg  (1.5 tabs) in the morning and 80mg  (1 tab) in the evening   A refill for metolazone have been sent to the pharmacy   Labs today and repeat in 10 days at your physicians office.  A script was provided We will only contact you if something comes back abnormal or we need to make some changes. Otherwise no news is good news!   Your physician recommends that you schedule a follow-up appointment in: 1 month   Your physician has requested that you have an echocardiogram in April. Echocardiography is a painless test that uses sound waves to create images of your heart. It provides your doctor with information about the size and shape of your heart and how well your heart's chambers and valves are working. This procedure takes approximately one hour. There are no restrictions for this procedure.    A CT scan of your chest was ordered to be done in February.  You will get a call to schedule this appointment.   Please call office at [phone] option 2 if you have any questions or concerns.     At the [facility], you and your health needs are our priority. As part of our continuing mission to provide you with exceptional heart care, we have created designated Provider Care Teams. These Care Teams include your primary Cardiologist (physician) and Advanced Practice Providers (APPs- Physician Assistants and Nurse Practitioners) who all work together to provide you with the care you need, when you need it.   You may see any of the following providers on your designated Care Team at your next follow up: Marland Kitchen Dr Glori Bickers . Dr Loralie Champagne . Darrick Grinder, NP . Lyda Jester, PA . Audry Riles, PharmD   Please be sure to bring in all your medications bottles to every appointment.

## 2019-09-28 NOTE — Progress Notes (Signed)
Date:  09/28/2019   ID:  Alisha [name], DOB 1941-07-15, MRN [id number]   Provider location: [facility] Type of Visit: Established patient   PCP:  Neila Gear, MD  Cardiologist:  Dr. Aundra Dubin  Chief Complaint: Shortness of breath   History of Present Illness: Alisha [name] is a 79 y.o. female with history of diabetes, HTN, PAD, hyperlipidemia who was referred by Dr. Wynonia Lawman for evaluation of pulmonary hypertension.   For > 1 year, she has had significant exertional dyspnea. It has been steadily worsening, especially over the last few months.  She has been extensively worked up so far.  Echo in 4/18 showed preserved EF 65% with moderate pulmonary hypertension.  She had episodes of syncope in 5/18 and 10/18.  She wore an event monitor in 5/18 with no significant arrhythmia.  LHC/RHC in 1/19 showed normal PCWP and severely elevated PA pressure, no significant CAD.    At initial appointment in 1/19, she was noted to be hypoxemic with exertion and home oxygen was started for use with exertion.  I also started her on Opsumit. She had to stop this after about a week due to significantly increased exertional peripheral edema.  This resolved after Opsumit was stopped.   Echo in 4/19.  This showed EF 60-65%, mild MS, mild AS, moderately dilated RV with mildly decreased systolic function.   She was found to have Fe deficiency anemia, FOBT+.  EGD showed gastritis and c-scope showed diverticulosis and polyps in 4/19.  She get IV Fe.  She is going to have a capsule endoscopy.   She tried to start ambrisentan, but developed worsening dyspnea and peripheral edema and low oxygen saturation.  This improved after ambrisentan was stopped.   She is now taking riociguat and Uptravi.  RHC in 4/20 showed normal filling pressures but severe pulmonary hypertension with PVR 6 WU.  Echo showed moderately dilated RV with normal systolic function, mild-moderate MS, mild AS.  [name] has  now been titrated up to 2400 mcg bid and riociguat to 1.5 mg tid.    She returns for followup of diastolic CHF and pulmonary hypertension.  She has been more winded recently, she is concerned that she is anemic again.  She has had an extensive GI workup without finding the source of bleeding, but she continues to have slow GI bleeding.  She is short of breath after walking 100-200 feet.  She has been working more recently and feels tired.  Weight is up 4 lbs.  No lightheadedness or syncope.  No chest pain.   6 minute walk (1/19): 92 m, oxygen saturation dropped to 70%.   6 minute walk (3/19): 122 m 6 minute walk (4/19): 392 m 6 minute walk (9/19): 317 m 6 minute walk (1/20): 320 m 6 minute walk (8/20): 359 m 6 minute walk (10/20): 391 m  Labs (1/19): anti-Jo1 negative, RF borderline positive, SCL-70 negative, HIV negative. ESR 21. BNP 1129.  Labs (3/19): ANA + => anti-dsDNA negative, SSA/SSB negative, anti-Sm negative, anti-RNP negative. K 3.8 => 3.7, creatinine 1.4 => 1.55.  Labs (4/19): K 4.1 => 3.1, creatinine 1.58 => 1.2, BNP 1902, CCP Ab negative.  Labs (6/19): hgb 9, creatinine 1.66, K 3 Labs (7/19): K 4.9, creatinine 1.7 Labs (8/19): K 3.6, creatinine 1.36 Labs (9/19): K 4.1, creatinine 1.55, BNP 641 Labs (10/19): K 4.7, creatinine 1.88, BNP 595 Labs (1/20): Hgb 12.2 Labs (4/20): K 4.2, creatinine 2.2, hgb 8.4 Labs (7/20): K  3.2, creatinine 2.07, hgb 8 Labs (8/20): K 3.7, creatinine 1.46 Labs (9/20): K 4, creatinine 1.5 Labs (10/20): K 4.5, creatinine 1.75  PMH: 1. PAD: Non-healing left ankle ulcer.  - ABIs 9/12 were normal.  2. Type 2 diabetes 3. Hypothyroidism 4. CKD stage 3 5. Hyperlipidemia 6. HTN 7. IBS 8. OSA: on CPAP.  9. Event monitor 5/18: No significant abnormality.  10. DVT in 1995 11. Pulmonary hypertension: Echo (4/18) with EF 65%, mild MR, mild TR, mild mitral stenosis, moderate pulmonary hypertension. - CTA chest 10/17: No PE.  - RHC (1/19): mean RA  11, PA 87/31 mean 51, mean PCWP 6, CI 3.58, PVR 7 WU.  - PFTs (1/19): mild obstruction, some restriction => severely decreased DLCO. - High resolution CT chest (1/19): no interstitial lung disease or emphysema. 7 mm nodule RUL.  - anti-Jo1 negative, RF borderline positive, SCL-70 negative, HIV negative. ANA + => anti-dsDNA negative, SSA/SSB negative, anti-Sm negative, anti-RNP negative. - V/Q scan 2/19: No e/o chronic PE.  - Echo (4/19): EF 60-65%, mild LVH, mild aortic stenosis, mild mitral stenosis, moderately dilated RV with mildly decreased systolic function, PASP 36 mmHg (poor envelope, may be underestimated).  - Does not tolerate ERAs (macitentan, ambrisentan).  - RHC (4/20): mean RA 5, PA 85/22 mean 51, mean PCWP 12, CI  3.5, PVR 6 WU.  - Echo (4/20): EF > 65%, moderate LVH, RV moderately dilated with normal systolic function, moderate pericardial effusion, mild-moderate mitral stenosis with mean gradient 7/MVA by PHT 2.48 cm^2, mild AS mean gradient 12.  12. Coronary angiography 11/18 without significant disease.  - Coronary angiography in 1/19 with luminal irregularities 13. Mitral stenosis: Mild by cath in 1/19, mean gradient 2.6 mmHg. Mild on 4/19 echo.  - Mild-moderate by echo in 4/20.  14. Aortic stenosis: Mild on 4/19 echo.  - Mild on 4/20 echo.  15. Fe deficiency anemia: EGD showed gastritis and c-scope showed diverticulosis and polyps in 4/19. Capsule endoscopy was unrevealing.  16. Pulmonary nodule: CT chest 7/19 with RUL nodule.  17. Low back pain/sciatica  Current Outpatient Medications  Medication Sig Dispense Refill  . atorvastatin (LIPITOR) 20 MG tablet Take 20 mg by mouth every morning.    . cetirizine (ZYRTEC) 10 MG tablet Take 10 mg by mouth daily as needed for allergies.    . fenofibrate 160 MG tablet Take 160 mg by mouth every morning.    . ferrous sulfate 325 (65 FE) MG tablet Take 325 mg by mouth daily with breakfast. Morning and night    . furosemide (LASIX) 80  MG tablet Take 1.5 tablets (120 mg total) by mouth every morning AND 1 tablet (80 mg total) every evening. 75 tablet 6  . glimepiride (AMARYL) 4 MG tablet Take 4 mg by mouth daily with breakfast.    . levothyroxine (SYNTHROID, LEVOTHROID) 125 MCG tablet Take 125 mcg by mouth daily before breakfast.    . loperamide (IMODIUM A-D) 2 MG tablet Take 2 mg by mouth 4 (four) times daily as needed for diarrhea or loose stools.    Marland Kitchen loratadine (CLARITIN) 10 MG tablet Take 10 mg by mouth daily as needed for allergies.     Marland Kitchen losartan (COZAAR) 100 MG tablet Take 100 mg by mouth daily.    . metolazone (ZAROXOLYN) 2.5 MG tablet Take 1 tablet (2.5 mg total) by mouth once a week. Every Saturday 10 tablet 6  . Multiple Vitamins-Minerals (PRESERVISION AREDS 2 PO) Take 1 capsule by mouth 2 (two) times  daily.    . pantoprazole (PROTONIX) 40 MG tablet Take 40 mg by mouth every morning.    . potassium chloride (KLOR-CON) 10 MEQ tablet Take 10 mEq by mouth daily.    . Riociguat 2.5 MG TABS Take 2.5 mg by mouth 3 (three) times daily. 90 tablet 0  . Selexipag 1600 MCG TABS Take 2,400 mcg by mouth 2 (two) times a day. Pt will take one 853mg and one 1602m twice a day. Order faxed to [facility] 60 tablet 11  . spironolactone (ALDACTONE) 25 MG tablet TAKE 1/2 TABLET BY MOUTH EVERY DAY 45 tablet 1  . traMADol (ULTRAM) 50 MG tablet Take 1 tablet (50 mg total) by mouth every 12 (twelve) hours as needed for moderate pain. 20 tablet 0   No current facility-administered medications for this encounter.      Allergies:   Other and Adhesive [tape]   Social History:  The patient  reports that she has never smoked. She has never used smokeless tobacco. She reports that she does not drink alcohol or use drugs.   Family History:  The patient's family history is not on file.   ROS:  Please see the history of present illness.   All other systems are personally reviewed and negative.   Exam:   BP (!) 116/58   Pulse 87   Wt 77.7  kg (171 lb 3.2 oz)   SpO2 97%   BMI 28.06 kg/m  General: NAD Neck: JVP 10 cm with HJR, no thyromegaly or thyroid nodule.  Lungs: Clear to auscultation bilaterally with normal respiratory effort. CV: Nondisplaced PMI.  Heart regular S1/S2, no S3/S4, 3/6 SEM RUSB.  1+ edema to knees.  No carotid bruit.  Normal pedal pulses.  Abdomen: Soft, nontender, no hepatosplenomegaly, no distention.  Skin: Intact without lesions or rashes.  Neurologic: Alert and oriented x 3.  Psych: Normal affect. Extremities: No clubbing or cyanosis.  HEENT: Normal.   Recent Labs: 05/10/2019: B Natriuretic Peptide 540.6 09/27/2019: BUN 45; Creatinine, Ser 1.84; Hemoglobin 8.2; Platelets 357; Potassium 3.9; Sodium 141  Personally reviewed   Wt Readings from Last 3 Encounters:  09/27/19 77.7 kg (171 lb 3.2 oz)  06/27/19 76.1 kg (167 lb 12.8 oz)  05/10/19 77.5 kg (170 lb 12.8 oz)      ASSESSMENT AND PLAN:  1. Pulmonary hypertension:  Severe PAH with PVR 7 WU on 1/19 RHC.  Etiology uncertain.  She has a history of DVT remotely but V/Q scan in 2/19 did not show evidence for chronic PE. She has OSA and uses CPAP.  Serologic workup showed only positive ANA but when additional serologies were done, they were all negative (see above).  CT chest with no ILD or emphysema.  Valvular heart disease does not appear significant enough to cause this degree of PH.  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  Echo in 4/19 showed that the RV is moderately dilated with mildly decreased systolic function, PA systolic pressure estimation was only 36 mmHg but poor envelope and likely underestimated. She did not tolerate Opsumit or ambrisentan due to significant lower extremity edema and worsening dyspnea.  RHHaslettn 4/20 showed severe pulmonary hypertension with normal filling pressures and preserved cardiac output.  Echo in 4/20 showed moderate RV dilation with normal systolic function. She has been more short of breath recently and is worried that she is anemic.   She also is volume overloaded on exam.     - Continue Adempas, she is at goal.  - Malvin Johnss  up to 2400 mcg BID.   - Increase Lasix to 120 mg qam/80 mg qpm. BMET today and in 10 days.   - Metolazone 2.5 qwk - spironolactone 12.5 daily.  - Echo in 4/21.  - Needs CBC to look for worsening anemia.  2. Syncope: Suspect related to severe pulmonary hypertension.  None recently.   3. Mitral stenosis: Noted on echo and cath, mild-moderate.   - Repeat echo 4/21.  4. H/o DVT: No chronic PE on V/Q scan.   5. HTN: BP stable - Continue losartan and spironolactone.  6. OSA: Continue CPAP qHS.  7. Lung nodule: Repeat CT to follow in 2/21.  8. CKD: Stage 3.  BMET today.   9. Aortic stenosis: Mild on 4/20 echo.   10. H/o GI bleeding: Slow chronic GI bleed, source has not been found despite extensive workup.  - CBC today given worsened dyspnea.   Followup in 1 month  Signed, Loralie Champagne, MD  09/28/2019   [facility] [address] [facility] [place] [address] [phone] (office) [phone] (fax)

## 2019-10-04 ENCOUNTER — Other Ambulatory Visit (HOSPITAL_COMMUNITY): Payer: Medicare Other

## 2019-10-05 DIAGNOSIS — I270 Primary pulmonary hypertension: Secondary | ICD-10-CM | POA: Diagnosis not present

## 2019-10-07 ENCOUNTER — Other Ambulatory Visit: Payer: Self-pay

## 2019-10-07 ENCOUNTER — Ambulatory Visit (HOSPITAL_COMMUNITY)
Admission: RE | Admit: 2019-10-07 | Discharge: 2019-10-07 | Disposition: A | Discharge status: Home / Self Care | Payer: Medicare Other | Source: Ambulatory Visit | Attending: Internal Medicine | Admitting: Internal Medicine

## 2019-10-07 ENCOUNTER — Encounter (HOSPITAL_COMMUNITY): Payer: Self-pay

## 2019-10-07 ENCOUNTER — Telehealth (HOSPITAL_COMMUNITY): Payer: Self-pay

## 2019-10-07 DIAGNOSIS — I5032 Chronic diastolic (congestive) heart failure: Secondary | ICD-10-CM | POA: Insufficient documentation

## 2019-10-07 LAB — BASIC METABOLIC PANEL
Anion gap: 13 (ref 5–15)
BUN: 62 mg/dL — ABNORMAL HIGH (ref 8–23)
CO2: 23 mmol/L (ref 22–32)
Calcium: 8.4 mg/dL — ABNORMAL LOW (ref 8.9–10.3)
Chloride: 102 mmol/L (ref 98–111)
Creatinine, Ser: 2.34 mg/dL — ABNORMAL HIGH (ref 0.44–1.00)
GFR calc Af Amer: 23 mL/min — ABNORMAL LOW (ref >60)
GFR calc non Af Amer: 19 mL/min — ABNORMAL LOW (ref >60)
Glucose, Bld: 145 mg/dL — ABNORMAL HIGH (ref 70–99)
Potassium: 3.7 mmol/L (ref 3.5–5.1)
Sodium: 138 mmol/L (ref 135–145)

## 2019-10-07 MED ORDER — FUROSEMIDE 80 MG PO TABS: 80.0000 mg | ORAL_TABLET | Freq: Two times a day (BID) | ORAL | 6 refills | Status: DC

## 2019-10-07 NOTE — Telephone Encounter (Signed)
Pt aware of results. Pt advised to hold Lasix for 1 day, then resume a lower dose of 80mg  twice a day.  She will also hold Metolazone this week and repeat bmet in 1 week. Verbalized understanding. Info sent via mychart to patient. Pt appreciative.

## 2019-10-07 NOTE — Telephone Encounter (Signed)
-----   Message from Larey Dresser, MD sent at 10/07/2019  1:04 PM EST ----- Creatinine is significantly higher.  Hold Lasix for a day then decrease to 80 mg bid.  Hold off on taking metolazone this week. Repeat BMET 1 week.

## 2019-10-08 ENCOUNTER — Telehealth (HOSPITAL_COMMUNITY): Payer: Self-pay | Admitting: Vascular Surgery

## 2019-10-08 NOTE — Telephone Encounter (Signed)
Left pt VM giving CT APPT 10/16/19 @ 4pvm asked pt to call back to confirm appt

## 2019-10-08 NOTE — Telephone Encounter (Signed)
PT returned call confirming CT appt on 10/16/19.

## 2019-10-14 ENCOUNTER — Other Ambulatory Visit (HOSPITAL_COMMUNITY): Payer: Medicare Other

## 2019-10-15 ENCOUNTER — Ambulatory Visit (HOSPITAL_COMMUNITY): Payer: Medicare Other

## 2019-10-16 ENCOUNTER — Other Ambulatory Visit: Payer: Self-pay

## 2019-10-16 ENCOUNTER — Ambulatory Visit (HOSPITAL_COMMUNITY)
Admission: RE | Admit: 2019-10-16 | Discharge: 2019-10-16 | Disposition: A | Discharge status: Home / Self Care | Payer: Medicare Other | Source: Ambulatory Visit | Attending: Cardiology | Admitting: Cardiology

## 2019-10-16 ENCOUNTER — Ambulatory Visit (HOSPITAL_COMMUNITY)
Admission: RE | Admit: 2019-10-16 | Discharge: 2019-10-16 | Disposition: A | Discharge status: Home / Self Care | Payer: Medicare Other | Source: Ambulatory Visit | Attending: Internal Medicine | Admitting: Internal Medicine

## 2019-10-16 DIAGNOSIS — I5032 Chronic diastolic (congestive) heart failure: Secondary | ICD-10-CM | POA: Diagnosis present

## 2019-10-16 DIAGNOSIS — R911 Solitary pulmonary nodule: Secondary | ICD-10-CM

## 2019-10-16 LAB — BASIC METABOLIC PANEL
Anion gap: 7 (ref 5–15)
BUN: 44 mg/dL — ABNORMAL HIGH (ref 8–23)
CO2: 22 mmol/L (ref 22–32)
Calcium: 8.2 mg/dL — ABNORMAL LOW (ref 8.9–10.3)
Chloride: 110 mmol/L (ref 98–111)
Creatinine, Ser: 1.78 mg/dL — ABNORMAL HIGH (ref 0.44–1.00)
GFR calc Af Amer: 31 mL/min — ABNORMAL LOW (ref >60)
GFR calc non Af Amer: 27 mL/min — ABNORMAL LOW (ref >60)
Glucose, Bld: 120 mg/dL — ABNORMAL HIGH (ref 70–99)
Potassium: 4.1 mmol/L (ref 3.5–5.1)
Sodium: 139 mmol/L (ref 135–145)

## 2019-10-24 ENCOUNTER — Encounter (HOSPITAL_COMMUNITY): Payer: Self-pay

## 2019-10-24 MED ORDER — SELEXIPAG 800 MCG PO TABS
800.00 | ORAL_TABLET | ORAL | Status: DC
Start: 2019-10-24 — End: 2019-10-24

## 2019-10-24 MED ORDER — SPIRONOLACTONE 25 MG PO TABS
12.50 | ORAL_TABLET | ORAL | Status: DC
Start: 2019-10-25 — End: 2019-10-24

## 2019-10-24 MED ORDER — RIOCIGUAT 2.5 MG PO TABS
2.50 | ORAL_TABLET | ORAL | Status: DC
Start: 2019-10-24 — End: 2019-10-24

## 2019-10-24 MED ORDER — LORATADINE 10 MG PO TABS
10.00 | ORAL_TABLET | ORAL | Status: DC
Start: 2019-10-25 — End: 2019-10-24

## 2019-10-24 MED ORDER — GENERIC EXTERNAL MEDICATION
Status: DC
Start: ? — End: 2019-10-24

## 2019-10-24 MED ORDER — AMLODIPINE BESYLATE 5 MG PO TABS
5.00 | ORAL_TABLET | ORAL | Status: DC
Start: 2019-10-25 — End: 2019-10-24

## 2019-10-24 MED ORDER — BENZONATATE 100 MG PO CAPS
100.00 | ORAL_CAPSULE | ORAL | Status: DC
Start: ? — End: 2019-10-24

## 2019-10-24 MED ORDER — FENOFIBRATE 54 MG PO TABS
54.00 | ORAL_TABLET | ORAL | Status: DC
Start: 2019-10-25 — End: 2019-10-24

## 2019-10-24 MED ORDER — FERROUS SULFATE 325 (65 FE) MG PO TABS
325.00 | ORAL_TABLET | ORAL | Status: DC
Start: 2019-10-24 — End: 2019-10-24

## 2019-10-24 MED ORDER — PANTOPRAZOLE SODIUM 40 MG IV SOLR
40.00 | INTRAVENOUS | Status: DC
Start: 2019-10-24 — End: 2019-10-24

## 2019-10-24 MED ORDER — SODIUM CHLORIDE 0.9 % IV SOLN
10.00 | INTRAVENOUS | Status: DC
Start: ? — End: 2019-10-24

## 2019-10-24 MED ORDER — SODIUM CHLORIDE 0.9 % IV SOLN
30.00 | INTRAVENOUS | Status: DC
Start: 2019-10-24 — End: 2019-10-24

## 2019-10-24 MED ORDER — DEXTROSE 10 % IV SOLN
30.00 | INTRAVENOUS | Status: DC
Start: ? — End: 2019-10-24

## 2019-10-24 MED ORDER — FUROSEMIDE 40 MG PO TABS
80.00 | ORAL_TABLET | ORAL | Status: DC
Start: 2019-10-24 — End: 2019-10-24

## 2019-10-24 MED ORDER — GENERIC EXTERNAL MEDICATION
125.00 | Status: DC
Start: 2019-10-24 — End: 2019-10-24

## 2019-10-24 MED ORDER — DEXAMETHASONE 4 MG PO TABS
6.00 | ORAL_TABLET | ORAL | Status: DC
Start: 2019-10-25 — End: 2019-10-24

## 2019-10-24 MED ORDER — ATORVASTATIN CALCIUM 20 MG PO TABS
20.00 | ORAL_TABLET | ORAL | Status: DC
Start: 2019-10-24 — End: 2019-10-24

## 2019-10-24 MED ORDER — LOSARTAN POTASSIUM 100 MG PO TABS
100.00 | ORAL_TABLET | ORAL | Status: DC
Start: 2019-10-25 — End: 2019-10-24

## 2019-11-08 ENCOUNTER — Encounter (HOSPITAL_COMMUNITY): Payer: Medicare Other | Admitting: Cardiology

## 2019-11-13 ENCOUNTER — Other Ambulatory Visit (HOSPITAL_COMMUNITY): Payer: Self-pay | Admitting: Cardiology

## 2019-11-29 ENCOUNTER — Other Ambulatory Visit (HOSPITAL_COMMUNITY): Payer: Self-pay | Admitting: Cardiology

## 2019-12-17 ENCOUNTER — Other Ambulatory Visit (HOSPITAL_COMMUNITY): Payer: Self-pay | Admitting: Cardiology

## 2019-12-27 ENCOUNTER — Ambulatory Visit (HOSPITAL_COMMUNITY)
Admission: RE | Admit: 2019-12-27 | Discharge: 2019-12-27 | Disposition: A | Discharge status: Home / Self Care | Payer: Medicare Other | Source: Ambulatory Visit | Attending: Internal Medicine | Admitting: Internal Medicine

## 2019-12-27 ENCOUNTER — Other Ambulatory Visit: Payer: Self-pay

## 2019-12-27 DIAGNOSIS — E119 Type 2 diabetes mellitus without complications: Secondary | ICD-10-CM | POA: Diagnosis not present

## 2019-12-27 DIAGNOSIS — I272 Pulmonary hypertension, unspecified: Secondary | ICD-10-CM | POA: Insufficient documentation

## 2019-12-27 DIAGNOSIS — I110 Hypertensive heart disease with heart failure: Secondary | ICD-10-CM | POA: Insufficient documentation

## 2019-12-27 DIAGNOSIS — I5032 Chronic diastolic (congestive) heart failure: Secondary | ICD-10-CM | POA: Diagnosis not present

## 2019-12-27 DIAGNOSIS — I2510 Atherosclerotic heart disease of native coronary artery without angina pectoris: Secondary | ICD-10-CM | POA: Diagnosis not present

## 2019-12-27 NOTE — Progress Notes (Signed)
  Echocardiogram 2D Echocardiogram has been performed.  Marybelle Killings 12/27/2019, 12:10 PM

## 2020-01-02 ENCOUNTER — Other Ambulatory Visit (HOSPITAL_COMMUNITY): Payer: Self-pay

## 2020-01-02 MED ORDER — RIOCIGUAT 2.5 MG PO TABS: 2.5000 mg | ORAL_TABLET | Freq: Three times a day (TID) | ORAL | 3 refills | Status: DC

## 2020-01-03 DIAGNOSIS — I270 Primary pulmonary hypertension: Secondary | ICD-10-CM | POA: Diagnosis not present

## 2020-01-15 ENCOUNTER — Encounter (HOSPITAL_COMMUNITY): Payer: Self-pay | Admitting: Cardiology

## 2020-01-15 ENCOUNTER — Ambulatory Visit (HOSPITAL_COMMUNITY)
Admission: RE | Admit: 2020-01-15 | Discharge: 2020-01-15 | Disposition: A | Discharge status: Home / Self Care | Payer: Medicare Other | Source: Ambulatory Visit | Attending: Cardiology | Admitting: Cardiology

## 2020-01-15 ENCOUNTER — Other Ambulatory Visit: Payer: Self-pay

## 2020-01-15 VITALS — BP 84/40 | HR 86 | Wt 166.6 lb

## 2020-01-15 DIAGNOSIS — E785 Hyperlipidemia, unspecified: Secondary | ICD-10-CM | POA: Insufficient documentation

## 2020-01-15 DIAGNOSIS — I5189 Other ill-defined heart diseases: Secondary | ICD-10-CM | POA: Diagnosis not present

## 2020-01-15 DIAGNOSIS — R600 Localized edema: Secondary | ICD-10-CM | POA: Diagnosis not present

## 2020-01-15 DIAGNOSIS — Z79899 Other long term (current) drug therapy: Secondary | ICD-10-CM | POA: Diagnosis not present

## 2020-01-15 DIAGNOSIS — Z79890 Hormone replacement therapy (postmenopausal): Secondary | ICD-10-CM | POA: Diagnosis not present

## 2020-01-15 DIAGNOSIS — I130 Hypertensive heart and chronic kidney disease with heart failure and stage 1 through stage 4 chronic kidney disease, or unspecified chronic kidney disease: Secondary | ICD-10-CM | POA: Insufficient documentation

## 2020-01-15 DIAGNOSIS — Z8719 Personal history of other diseases of the digestive system: Secondary | ICD-10-CM | POA: Insufficient documentation

## 2020-01-15 DIAGNOSIS — I272 Pulmonary hypertension, unspecified: Secondary | ICD-10-CM | POA: Diagnosis not present

## 2020-01-15 DIAGNOSIS — I050 Rheumatic mitral stenosis: Secondary | ICD-10-CM | POA: Diagnosis not present

## 2020-01-15 DIAGNOSIS — Z7984 Long term (current) use of oral hypoglycemic drugs: Secondary | ICD-10-CM | POA: Diagnosis not present

## 2020-01-15 DIAGNOSIS — I5032 Chronic diastolic (congestive) heart failure: Secondary | ICD-10-CM

## 2020-01-15 DIAGNOSIS — N183 Chronic kidney disease, stage 3 unspecified: Secondary | ICD-10-CM | POA: Diagnosis not present

## 2020-01-15 DIAGNOSIS — E1122 Type 2 diabetes mellitus with diabetic chronic kidney disease: Secondary | ICD-10-CM | POA: Diagnosis not present

## 2020-01-15 DIAGNOSIS — R55 Syncope and collapse: Secondary | ICD-10-CM | POA: Insufficient documentation

## 2020-01-15 DIAGNOSIS — I350 Nonrheumatic aortic (valve) stenosis: Secondary | ICD-10-CM | POA: Diagnosis not present

## 2020-01-15 DIAGNOSIS — R0602 Shortness of breath: Secondary | ICD-10-CM | POA: Diagnosis not present

## 2020-01-15 DIAGNOSIS — Z86718 Personal history of other venous thrombosis and embolism: Secondary | ICD-10-CM | POA: Insufficient documentation

## 2020-01-15 DIAGNOSIS — Z8616 Personal history of COVID-19: Secondary | ICD-10-CM | POA: Insufficient documentation

## 2020-01-15 DIAGNOSIS — E039 Hypothyroidism, unspecified: Secondary | ICD-10-CM | POA: Insufficient documentation

## 2020-01-15 DIAGNOSIS — G4733 Obstructive sleep apnea (adult) (pediatric): Secondary | ICD-10-CM | POA: Diagnosis not present

## 2020-01-15 LAB — BASIC METABOLIC PANEL
Anion gap: 13 (ref 5–15)
BUN: 42 mg/dL — ABNORMAL HIGH (ref 8–23)
CO2: 18 mmol/L — ABNORMAL LOW (ref 22–32)
Calcium: 8.8 mg/dL — ABNORMAL LOW (ref 8.9–10.3)
Chloride: 107 mmol/L (ref 98–111)
Creatinine, Ser: 2.28 mg/dL — ABNORMAL HIGH (ref 0.44–1.00)
GFR calc Af Amer: 23 mL/min — ABNORMAL LOW (ref >60)
GFR calc non Af Amer: 20 mL/min — ABNORMAL LOW (ref >60)
Glucose, Bld: 181 mg/dL — ABNORMAL HIGH (ref 70–99)
Potassium: 4.5 mmol/L (ref 3.5–5.1)
Sodium: 138 mmol/L (ref 135–145)

## 2020-01-15 LAB — CBC
HCT: 35.5 % — ABNORMAL LOW (ref 36.0–46.0)
Hemoglobin: 10.7 g/dL — ABNORMAL LOW (ref 12.0–15.0)
MCH: 28.2 pg (ref 26.0–34.0)
MCHC: 30.1 g/dL (ref 30.0–36.0)
MCV: 93.4 fL (ref 80.0–100.0)
Platelets: 357 10*3/uL (ref 150–400)
RBC: 3.8 MIL/uL — ABNORMAL LOW (ref 3.87–5.11)
RDW: 16.8 % — ABNORMAL HIGH (ref 11.5–15.5)
WBC: 8.2 10*3/uL (ref 4.0–10.5)
nRBC: 0 % (ref 0.0–0.2)

## 2020-01-15 MED ORDER — LOSARTAN POTASSIUM 50 MG PO TABS: 50.0000 mg | ORAL_TABLET | Freq: Every day | ORAL | 4 refills | Status: DC

## 2020-01-15 MED ORDER — FUROSEMIDE 80 MG PO TABS: 80.0000 mg | ORAL_TABLET | Freq: Two times a day (BID) | ORAL | 6 refills | Status: DC

## 2020-01-15 MED ORDER — METOLAZONE 2.5 MG PO TABS: 2.5000 mg | ORAL_TABLET | ORAL | 6 refills | Status: DC

## 2020-01-15 NOTE — Patient Instructions (Addendum)
RESTART Lasix 80mg  (1 tab) twice a day and Metolazone 2.5mg (1 tab) every Saturday    DECREASE Losartan to 50mg  (1 tab) daily   Labs today We will only contact you if something comes back abnormal or we need to make some changes. Otherwise no news is good news!   Your physician recommends that you schedule a follow-up appointment in: 3 weeks with the Nurse Practitioner   Please call office at [phone] option 2 if you have any questions or concerns.    At the [facility], you and your health needs are our priority. As part of our continuing mission to provide you with exceptional heart care, we have created designated Provider Care Teams. These Care Teams include your primary Cardiologist (physician) and Advanced Practice Providers (APPs- Physician Assistants and Nurse Practitioners) who all work together to provide you with the care you need, when you need it.   You may see any of the following providers on your designated Care Team at your next follow up: Marland Kitchen Dr Glori Bickers . Dr Loralie Champagne . Darrick Grinder, NP . Lyda Jester, PA . Audry Riles, PharmD   Please be sure to bring in all your medications bottles to every appointment.

## 2020-01-16 ENCOUNTER — Telehealth (HOSPITAL_COMMUNITY): Payer: Self-pay | Admitting: *Deleted

## 2020-01-16 NOTE — Telephone Encounter (Signed)
Harvie Junior, [place]  01/16/2020 10:59 AM EDT    Pt returned call she is aware and agreeable with plan.    Branch, [place], [place]  03/13/2772 10:14 AM EDT    Left message to return call    Shonna Chock, [place]  03/16/509 4:11 PM EDT    Needs 1 week bmet per DM    Branch, [place], CMA  03/13/2523 3:54 PM EDT    Left message to return call    Larey Dresser, MD  01/15/2020 3:50 PM EDT    Creatinine higher. Ok to restart Lasix but would not restart metolazone.

## 2020-01-16 NOTE — Progress Notes (Signed)
Date:  01/16/2020   ID:  Alisha [name], DOB 1941/08/01, MRN [id number]   Provider location: [facility] Type of Visit: Established patient   PCP:  Neila Gear, MD  Cardiologist:  Dr. Aundra Dubin   History of Present Illness: Alisha [name] is a 79 y.o. female with history of diabetes, HTN, PAD, hyperlipidemia who was referred by Dr. Wynonia Lawman for evaluation of pulmonary hypertension.   For > 1 year, she has had significant exertional dyspnea. It has been steadily worsening, especially over the last few months.  She has been extensively worked up so far.  Echo in 4/18 showed preserved EF 65% with moderate pulmonary hypertension.  She had episodes of syncope in 5/18 and 10/18.  She wore an event monitor in 5/18 with no significant arrhythmia.  LHC/RHC in 1/19 showed normal PCWP and severely elevated PA pressure, no significant CAD.    At initial appointment in 1/19, she was noted to be hypoxemic with exertion and home oxygen was started for use with exertion.  I also started her on Opsumit. She had to stop this after about a week due to significantly increased exertional peripheral edema.  This resolved after Opsumit was stopped.   Echo in 4/19.  This showed EF 60-65%, mild MS, mild AS, moderately dilated RV with mildly decreased systolic function.   She was found to have Fe deficiency anemia, FOBT+.  EGD showed gastritis and c-scope showed diverticulosis and polyps in 4/19.  She get IV Fe.  She is going to have a capsule endoscopy.   She tried to start ambrisentan, but developed worsening dyspnea and peripheral edema and low oxygen saturation.  This improved after ambrisentan was stopped.   She is now taking riociguat and Uptravi.  RHC in 4/20 showed normal filling pressures but severe pulmonary hypertension with PVR 6 WU.  Echo showed moderately dilated RV with normal systolic function, mild-moderate MS, mild AS.  [name] has now been titrated up to 2400 mcg bid and  riociguat to 1.5 mg tid.    She had COVID-19 PNA in 2/21, hospitalized for about a week.   Echo in 4/21 showed EF 55-37%, grade 2 diastolic dysfunction, normal RV, PASP 50 mmHg, probably mild mitral stenosis with mean gradient 7 mmHg and MVA 2 cm^2 by PHT.   She returns for followup of diastolic CHF and pulmonary hypertension.  She has had increased dyspnea since COVID-19.  She can walk about 2 minutes then is short of breath.  No dyspnea walking in her apartment (now in senior living facility).  She is short of breath walking down the hall in her facility.  BP is low today.  She has had balance difficulties ("wobbling") and is using a walker.  She has been getting PT.  No chest pain.  Weight is down about 5 lbs. Of note, she has not been taking metolazone and has been out of Lasix for about a week.   Oxygen saturation measured with ambulation, dropped to 92%.   6 minute walk (1/19): 92 m, oxygen saturation dropped to 70%.   6 minute walk (3/19): 122 m 6 minute walk (4/19): 392 m 6 minute walk (9/19): 317 m 6 minute walk (1/20): 320 m 6 minute walk (8/20): 359 m 6 minute walk (10/20): 391 m  Labs (1/19): anti-Jo1 negative, RF borderline positive, SCL-70 negative, HIV negative. ESR 21. BNP 1129.  Labs (3/19): ANA + => anti-dsDNA negative, SSA/SSB negative, anti-Sm negative, anti-RNP negative. K 3.8 =>  3.7, creatinine 1.4 => 1.55.  Labs (4/19): K 4.1 => 3.1, creatinine 1.58 => 1.2, BNP 1902, CCP Ab negative.  Labs (6/19): hgb 9, creatinine 1.66, K 3 Labs (7/19): K 4.9, creatinine 1.7 Labs (8/19): K 3.6, creatinine 1.36 Labs (9/19): K 4.1, creatinine 1.55, BNP 641 Labs (10/19): K 4.7, creatinine 1.88, BNP 595 Labs (1/20): Hgb 12.2 Labs (4/20): K 4.2, creatinine 2.2, hgb 8.4 Labs (7/20): K 3.2, creatinine 2.07, hgb 8 Labs (8/20): K 3.7, creatinine 1.46 Labs (9/20): K 4, creatinine 1.5 Labs (10/20): K 4.5, creatinine 1.75 Labs (2/21): K 4.1, creatinine 1.78  PMH: 1. PAD: Non-healing  left ankle ulcer.  - ABIs 9/12 were normal.  2. Type 2 diabetes 3. Hypothyroidism 4. CKD stage 3 5. Hyperlipidemia 6. HTN 7. IBS 8. OSA: on CPAP.  9. Event monitor 5/18: No significant abnormality.  10. DVT in 1995 11. Pulmonary hypertension: Echo (4/18) with EF 65%, mild MR, mild TR, mild mitral stenosis, moderate pulmonary hypertension. - CTA chest 10/17: No PE.  - RHC (1/19): mean RA 11, PA 87/31 mean 51, mean PCWP 6, CI 3.58, PVR 7 WU.  - PFTs (1/19): mild obstruction, some restriction => severely decreased DLCO. - High resolution CT chest (1/19): no interstitial lung disease or emphysema. 7 mm nodule RUL.  - anti-Jo1 negative, RF borderline positive, SCL-70 negative, HIV negative. ANA + => anti-dsDNA negative, SSA/SSB negative, anti-Sm negative, anti-RNP negative. - V/Q scan 2/19: No e/o chronic PE.  - Echo (4/19): EF 60-65%, mild LVH, mild aortic stenosis, mild mitral stenosis, moderately dilated RV with mildly decreased systolic function, PASP 36 mmHg (poor envelope, may be underestimated).  - Does not tolerate ERAs (macitentan, ambrisentan).  - RHC (4/20): mean RA 5, PA 85/22 mean 51, mean PCWP 12, CI  3.5, PVR 6 WU.  - Echo (4/20): EF > 65%, moderate LVH, RV moderately dilated with normal systolic function, moderate pericardial effusion, mild-moderate mitral stenosis with mean gradient 7/MVA by PHT 2.48 cm^2, mild AS mean gradient 12.  - Echo (4/21): EF 16-10%, grade 2 diastolic dysfunction, normal RV, PASP 50 mmHg, probably mild mitral stenosis with mean gradient 7 mmHg and MVA 2 cm^2 by PHT, mild AS.  12. Coronary angiography 11/18 without significant disease.  - Coronary angiography in 1/19 with luminal irregularities 13. Mitral stenosis: Mild by cath in 1/19, mean gradient 2.6 mmHg. Mild on 4/19 echo.  - Mild-moderate by echo in 4/20.  - Mild by echo in 4/21.  14. Aortic stenosis: Mild on 4/19 echo.  - Mild on 4/20 echo.  - Mild on 4/21 echo.  15. Fe deficiency anemia: EGD  showed gastritis and c-scope showed diverticulosis and polyps in 4/19. Capsule endoscopy was unrevealing.  16. Pulmonary nodule: CT chest 7/19 with RUL nodule.  17. Low back pain/sciatica 18. COVID-19 PNA in 2/21.   Current Outpatient Medications  Medication Sig Dispense Refill  . atorvastatin (LIPITOR) 20 MG tablet Take 20 mg by mouth every morning.    . cetirizine (ZYRTEC) 10 MG tablet Take 10 mg by mouth daily as needed for allergies.    . fenofibrate 160 MG tablet Take 160 mg by mouth every morning.    . ferrous sulfate 325 (65 FE) MG tablet Take 325 mg by mouth daily with breakfast. Morning and night    . furosemide (LASIX) 80 MG tablet Take 1 tablet (80 mg total) by mouth 2 (two) times daily. 60 tablet 6  . glimepiride (AMARYL) 4 MG tablet Take 4 mg by  mouth daily with breakfast.    . levothyroxine (SYNTHROID, LEVOTHROID) 125 MCG tablet Take 125 mcg by mouth daily before breakfast.    . loperamide (IMODIUM A-D) 2 MG tablet Take 2 mg by mouth 4 (four) times daily as needed for diarrhea or loose stools.    Marland Kitchen loratadine (CLARITIN) 10 MG tablet Take 10 mg by mouth daily as needed for allergies.     Marland Kitchen losartan (COZAAR) 50 MG tablet Take 1 tablet (50 mg total) by mouth daily. 30 tablet 4  . Multiple Vitamins-Minerals (PRESERVISION AREDS 2 PO) Take 1 capsule by mouth 2 (two) times daily.    . pantoprazole (PROTONIX) 40 MG tablet Take 40 mg by mouth every morning.    . potassium chloride (KLOR-CON) 10 MEQ tablet Take 10 mEq by mouth daily as needed.     . Riociguat 2.5 MG TABS Take 2.5 mg by mouth 3 (three) times daily. 270 tablet 3  . Selexipag 1600 MCG TABS Take 2,400 mcg by mouth 2 (two) times a day. Pt will take one 861mg and one 16076m twice a day. Order faxed to [facility] 60 tablet 11  . spironolactone (ALDACTONE) 25 MG tablet TAKE 1/2 TABLET BY MOUTH EVERY DAY 45 tablet 1   No current facility-administered medications for this encounter.    Allergies:   Other and Adhesive [tape]    Social History:  The patient  reports that she has never smoked. She has never used smokeless tobacco. She reports that she does not drink alcohol or use drugs.   Family History:  The patient's family history is not on file.   ROS:  Please see the history of present illness.   All other systems are personally reviewed and negative.   Exam:   BP (!) 84/40   Pulse 86   Wt 75.6 kg (166 lb 9.6 oz)   SpO2 94%   BMI 27.30 kg/m  General: NAD Neck: JVP 8-9 cm with HJR, no thyromegaly or thyroid nodule.  Lungs: Crackles at left base. CV: Nondisplaced PMI.  Heart regular S1/S2, no S3/S4, 2/6 SEM RUSB.  1+ edema 3/4 to knees.  No carotid bruit.  Normal pedal pulses.  Abdomen: Soft, nontender, no hepatosplenomegaly, no distention.  Skin: Intact without lesions or rashes.  Neurologic: Alert and oriented x 3.  Psych: Normal affect. Extremities: No clubbing or cyanosis.  HEENT: Normal.   Recent Labs: 05/10/2019: B Natriuretic Peptide 540.6 01/15/2020: BUN 42; Creatinine, Ser 2.28; Hemoglobin 10.7; Platelets 357; Potassium 4.5; Sodium 138  Personally reviewed   Wt Readings from Last 3 Encounters:  01/15/20 75.6 kg (166 lb 9.6 oz)  09/27/19 77.7 kg (171 lb 3.2 oz)  06/27/19 76.1 kg (167 lb 12.8 oz)    ASSESSMENT AND PLAN:  1. Pulmonary hypertension with RV failure:  Severe PAH with PVR 7 WU on 1/19 RHC.  Etiology uncertain.  She has a history of DVT remotely but V/Q scan in 2/19 did not show evidence for chronic PE. She has OSA and uses CPAP.  Serologic workup showed only positive ANA but when additional serologies were done, they were all negative (see above).  CT chest with no ILD or emphysema.  Valvular heart disease does not appear significant enough to cause this degree of PH.  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  Echo in 4/19 showed that the RV is moderately dilated with mildly decreased systolic function, PA systolic pressure estimation was only 36 mmHg but poor envelope and likely underestimated. She did  not tolerate Opsumit  or ambrisentan due to significant lower extremity edema and worsening dyspnea.  [place] in 4/20 showed severe pulmonary hypertension with normal filling pressures and preserved cardiac output.  Echo in 4/21 showed normal RV with PASP 50 mmHg. She has been more short of breath since she had COVID-19.  She has been out of Lasix for a week and is volume overloaded on exam.  NYHA class III.  - Continue Adempas, she is at goal.  [name] is up to 2400 mcg BID.   - Restart Lasix 80 mg bid. BMET today and in 10 days.  - Restart metolazone 2.5 qwk - spironolactone 12.5 daily.  2. Syncope: Suspect related to severe pulmonary hypertension.  None recently.   3. Mitral stenosis: Noted on echo and cath, probably mild on 4/21 echo.  4. H/o DVT: No chronic PE on V/Q scan.   5. HTN: BP low today.  - Decrease losartan to 50 mg daily.  6. OSA: Continue CPAP qHS.  7. CKD: Stage 3.  BMET today.   8. Aortic stenosis: Mild on 4/21 echo.   9. H/o GI bleeding: Slow chronic GI bleed, source has not been found despite extensive workup.   Followup in 3 wks with NP/PA.   Signed, Loralie Champagne, MD  01/16/2020   [facility] [address] [facility] [place] [address] [phone] (office) [phone] (fax)

## 2020-01-16 NOTE — Telephone Encounter (Signed)
-----   Message from Larey Dresser, MD sent at 01/15/2020  3:50 PM EDT ----- Creatinine higher.  Ok to restart Lasix but would not restart metolazone.

## 2020-01-23 ENCOUNTER — Telehealth (HOSPITAL_COMMUNITY): Payer: Self-pay | Admitting: *Deleted

## 2020-01-23 NOTE — Telephone Encounter (Signed)
Pt called to report her BP has been running low in the 80/50s and she is very fatigued. She restart Lasix 80 mg BID and decreased Losartan to 50 mg last week per Dr Aundra Dubin.  Discussed all w/Dr Aundra Dubin he advises pt should STOP Losartan at this time and continue to monitor BP and fatigue call us back if not improving  Pt aware, agreeable and verbalized understanding

## 2020-02-02 ENCOUNTER — Other Ambulatory Visit (HOSPITAL_COMMUNITY): Payer: Self-pay | Admitting: Cardiology

## 2020-02-06 ENCOUNTER — Encounter (HOSPITAL_COMMUNITY): Payer: Self-pay

## 2020-02-06 ENCOUNTER — Ambulatory Visit (HOSPITAL_COMMUNITY)
Admission: RE | Admit: 2020-02-06 | Discharge: 2020-02-06 | Disposition: A | Discharge status: Home / Self Care | Payer: Medicare Other | Source: Ambulatory Visit | Attending: Cardiology | Admitting: Cardiology

## 2020-02-06 ENCOUNTER — Other Ambulatory Visit: Payer: Self-pay

## 2020-02-06 VITALS — BP 112/48 | HR 70 | Wt 166.0 lb

## 2020-02-06 DIAGNOSIS — I1310 Hypertensive heart and chronic kidney disease without heart failure, with stage 1 through stage 4 chronic kidney disease, or unspecified chronic kidney disease: Secondary | ICD-10-CM | POA: Diagnosis not present

## 2020-02-06 DIAGNOSIS — N183 Chronic kidney disease, stage 3 unspecified: Secondary | ICD-10-CM | POA: Insufficient documentation

## 2020-02-06 DIAGNOSIS — K589 Irritable bowel syndrome without diarrhea: Secondary | ICD-10-CM | POA: Diagnosis not present

## 2020-02-06 DIAGNOSIS — I272 Pulmonary hypertension, unspecified: Secondary | ICD-10-CM | POA: Diagnosis not present

## 2020-02-06 DIAGNOSIS — Z79899 Other long term (current) drug therapy: Secondary | ICD-10-CM | POA: Insufficient documentation

## 2020-02-06 DIAGNOSIS — Z888 Allergy status to other drugs, medicaments and biological substances status: Secondary | ICD-10-CM | POA: Diagnosis not present

## 2020-02-06 DIAGNOSIS — I5032 Chronic diastolic (congestive) heart failure: Secondary | ICD-10-CM

## 2020-02-06 DIAGNOSIS — E1151 Type 2 diabetes mellitus with diabetic peripheral angiopathy without gangrene: Secondary | ICD-10-CM | POA: Insufficient documentation

## 2020-02-06 DIAGNOSIS — Z8719 Personal history of other diseases of the digestive system: Secondary | ICD-10-CM | POA: Insufficient documentation

## 2020-02-06 DIAGNOSIS — E039 Hypothyroidism, unspecified: Secondary | ICD-10-CM | POA: Diagnosis not present

## 2020-02-06 DIAGNOSIS — E1122 Type 2 diabetes mellitus with diabetic chronic kidney disease: Secondary | ICD-10-CM | POA: Diagnosis not present

## 2020-02-06 DIAGNOSIS — Z8616 Personal history of COVID-19: Secondary | ICD-10-CM | POA: Insufficient documentation

## 2020-02-06 DIAGNOSIS — D509 Iron deficiency anemia, unspecified: Secondary | ICD-10-CM | POA: Insufficient documentation

## 2020-02-06 DIAGNOSIS — I080 Rheumatic disorders of both mitral and aortic valves: Secondary | ICD-10-CM | POA: Insufficient documentation

## 2020-02-06 DIAGNOSIS — G4733 Obstructive sleep apnea (adult) (pediatric): Secondary | ICD-10-CM | POA: Insufficient documentation

## 2020-02-06 DIAGNOSIS — Z7984 Long term (current) use of oral hypoglycemic drugs: Secondary | ICD-10-CM | POA: Diagnosis not present

## 2020-02-06 DIAGNOSIS — R911 Solitary pulmonary nodule: Secondary | ICD-10-CM | POA: Insufficient documentation

## 2020-02-06 DIAGNOSIS — E785 Hyperlipidemia, unspecified: Secondary | ICD-10-CM | POA: Insufficient documentation

## 2020-02-06 DIAGNOSIS — Z86718 Personal history of other venous thrombosis and embolism: Secondary | ICD-10-CM | POA: Diagnosis not present

## 2020-02-06 LAB — CBC
HCT: 27.2 % — ABNORMAL LOW (ref 36.0–46.0)
Hemoglobin: 8 g/dL — ABNORMAL LOW (ref 12.0–15.0)
MCH: 27.6 pg (ref 26.0–34.0)
MCHC: 29.4 g/dL — ABNORMAL LOW (ref 30.0–36.0)
MCV: 93.8 fL (ref 80.0–100.0)
Platelets: 417 10*3/uL — ABNORMAL HIGH (ref 150–400)
RBC: 2.9 MIL/uL — ABNORMAL LOW (ref 3.87–5.11)
RDW: 18.6 % — ABNORMAL HIGH (ref 11.5–15.5)
WBC: 9 10*3/uL (ref 4.0–10.5)
nRBC: 0 % (ref 0.0–0.2)

## 2020-02-06 LAB — BASIC METABOLIC PANEL
Anion gap: 13 (ref 5–15)
BUN: 39 mg/dL — ABNORMAL HIGH (ref 8–23)
CO2: 19 mmol/L — ABNORMAL LOW (ref 22–32)
Calcium: 8.2 mg/dL — ABNORMAL LOW (ref 8.9–10.3)
Chloride: 105 mmol/L (ref 98–111)
Creatinine, Ser: 1.46 mg/dL — ABNORMAL HIGH (ref 0.44–1.00)
GFR calc Af Amer: 40 mL/min — ABNORMAL LOW (ref >60)
GFR calc non Af Amer: 34 mL/min — ABNORMAL LOW (ref >60)
Glucose, Bld: 190 mg/dL — ABNORMAL HIGH (ref 70–99)
Potassium: 3.1 mmol/L — ABNORMAL LOW (ref 3.5–5.1)
Sodium: 137 mmol/L (ref 135–145)

## 2020-02-06 NOTE — Patient Instructions (Signed)
Increase Furosemide to 80 mg Three times a day FOR 2 DAYS ONLY, then back to 80 mg Twice daily   Labs done today, your results will be available in MyChart, we will contact you for abnormal readings.  Your physician recommends that you schedule a follow-up appointment in: 3-4 weeks  If you have any questions or concerns before your next appointment please send [place] a message through [place] or call our office at [phone].    TO SPEAK WITH A NURSE SELECT OPTION 2, PLEASE LEAVE A MESSAGE INCLUDING: . YOUR NAME . DATE OF BIRTH . CALL BACK NUMBER . REASON FOR CALL  YOU WILL RECEIVE A CALL BACK THE SAME DAY AS LONG AS YOU CALL BEFORE 4:00 PM  At the [facility], you and your health needs are our priority. As part of our continuing mission to provide you with exceptional heart care, we have created designated Provider Care Teams. These Care Teams include your primary Cardiologist (physician) and Advanced Practice Providers (APPs- Physician Assistants and Nurse Practitioners) who all work together to provide you with the care you need, when you need it.   You may see any of the following providers on your designated Care Team at your next follow up: Marland Kitchen Dr Glori Bickers . Dr Loralie Champagne . Darrick Grinder, NP . Lyda Jester, PA . Audry Riles, PharmD   Please be sure to bring in all your medications bottles to every appointment.

## 2020-02-06 NOTE — Progress Notes (Signed)
Date:  02/06/2020   ID:  Alisha [name], DOB 12/14/1940, MRN [id number]   Provider location: [facility] Type of Visit: Established patient   PCP:  Neila Gear, MD  Cardiologist:  Dr. Aundra Dubin   History of Present Illness: Alisha [name] is a 79 y.o. female with history of diabetes, HTN, PAD, hyperlipidemia who was referred by Dr. Wynonia Lawman for evaluation of pulmonary hypertension.   For > 1 year, she has had significant exertional dyspnea. It has been steadily worsening, especially over the last few months.  She has been extensively worked up so far.  Echo in 4/18 showed preserved EF 65% with moderate pulmonary hypertension.  She had episodes of syncope in 5/18 and 10/18.  She wore an event monitor in 5/18 with no significant arrhythmia.  LHC/RHC in 1/19 showed normal PCWP and severely elevated PA pressure, no significant CAD.    At initial appointment in 1/19, she was noted to be hypoxemic with exertion and home oxygen was started for use with exertion.  I also started her on Opsumit. She had to stop this after about a week due to significantly increased exertional peripheral edema.  This resolved after Opsumit was stopped.   Echo in 4/19.  This showed EF 60-65%, mild MS, mild AS, moderately dilated RV with mildly decreased systolic function.   She was found to have Fe deficiency anemia, FOBT+.  EGD showed gastritis and c-scope showed diverticulosis and polyps in 4/19.  She get IV Fe.  She is going to have a capsule endoscopy.   She tried to start ambrisentan, but developed worsening dyspnea and peripheral edema and low oxygen saturation.  This improved after ambrisentan was stopped.   She is now taking riociguat and Uptravi.  RHC in 4/20 showed normal filling pressures but severe pulmonary hypertension with PVR 6 WU.  Echo showed moderately dilated RV with normal systolic function, mild-moderate MS, mild AS.  [name] has now been titrated up to 2400 mcg bid  and riociguat to 1.5 mg tid.    She had COVID-19 PNA in 2/21, hospitalized for about a week.   Echo in 4/21 showed EF 78-93%, grade 2 diastolic dysfunction, normal RV, PASP 50 mmHg, probably mild mitral stenosis with mean gradient 7 mmHg and MVA 2 cm^2 by PHT.   At last visit, she was seen by Dr. Aundra Dubin and endorsed increased dyspnea and was volume overloaded after running out of her diuretics x 1 week. He restarted lasix 80 mg bid. Given slight bump in SCr to 2.28, she was advised not to go back on Metolazone. Due to low BP, her Losartan was decreased down to 50 mg daily, however she continued to have hypotension and this was discontinued all together. Spironolactone also discontinued due to CKD and hypotension.   She presents back to clinic today for f/u. Feels somewhat improved but continues to note exertional dyspnea. She continues w/ volume overload w/ 1+ bilateral LEE and elevated ReDs clip reading at 43%. She denies resting dyspnea. No CP. Reports full compliance w/ Lasix. Tries to avoid salt. Does not have compression stockings at home. BP much improved off losartan. No further hypotension.   6 minute walk (1/19): 92 m, oxygen saturation dropped to 70%.   6 minute walk (3/19): 122 m  6 minute walk (4/19): 392 m 6 minute walk (9/19): 317 m 6 minute walk (1/20): 320 m 6 minute walk (8/20): 359 m 6 minute walk (10/20): 391 m  Labs (  1/19): anti-Jo1 negative, RF borderline positive, SCL-70 negative, HIV negative. ESR 21. BNP 1129.  Labs (3/19): ANA + => anti-dsDNA negative, SSA/SSB negative, anti-Sm negative, anti-RNP negative. K 3.8 => 3.7, creatinine 1.4 => 1.55.  Labs (4/19): K 4.1 => 3.1, creatinine 1.58 => 1.2, BNP 1902, CCP Ab negative.  Labs (6/19): hgb 9, creatinine 1.66, K 3 Labs (7/19): K 4.9, creatinine 1.7 Labs (8/19): K 3.6, creatinine 1.36 Labs (9/19): K 4.1, creatinine 1.55, BNP 641 Labs (10/19): K 4.7, creatinine 1.88, BNP 595 Labs (1/20): Hgb 12.2 Labs (4/20): K 4.2,  creatinine 2.2, hgb 8.4 Labs (7/20): K 3.2, creatinine 2.07, hgb 8 Labs (8/20): K 3.7, creatinine 1.46 Labs (9/20): K 4, creatinine 1.5 Labs (10/20): K 4.5, creatinine 1.75 Labs (2/21): K 4.1, creatinine 1.78  PMH: 1. PAD: Non-healing left ankle ulcer.  - ABIs 9/12 were normal.  2. Type 2 diabetes 3. Hypothyroidism 4. CKD stage 3 5. Hyperlipidemia 6. HTN 7. IBS 8. OSA: on CPAP.  9. Event monitor 5/18: No significant abnormality.  10. DVT in 1995 11. Pulmonary hypertension: Echo (4/18) with EF 65%, mild MR, mild TR, mild mitral stenosis, moderate pulmonary hypertension. - CTA chest 10/17: No PE.  - RHC (1/19): mean RA 11, PA 87/31 mean 51, mean PCWP 6, CI 3.58, PVR 7 WU.  - PFTs (1/19): mild obstruction, some restriction => severely decreased DLCO. - High resolution CT chest (1/19): no interstitial lung disease or emphysema. 7 mm nodule RUL.  - anti-Jo1 negative, RF borderline positive, SCL-70 negative, HIV negative. ANA + => anti-dsDNA negative, SSA/SSB negative, anti-Sm negative, anti-RNP negative. - V/Q scan 2/19: No e/o chronic PE.  - Echo (4/19): EF 60-65%, mild LVH, mild aortic stenosis, mild mitral stenosis, moderately dilated RV with mildly decreased systolic function, PASP 36 mmHg (poor envelope, may be underestimated).  - Does not tolerate ERAs (macitentan, ambrisentan).  - RHC (4/20): mean RA 5, PA 85/22 mean 51, mean PCWP 12, CI  3.5, PVR 6 WU.  - Echo (4/20): EF > 65%, moderate LVH, RV moderately dilated with normal systolic function, moderate pericardial effusion, mild-moderate mitral stenosis with mean gradient 7/MVA by PHT 2.48 cm^2, mild AS mean gradient 12.  - Echo (4/21): EF 16-10%, grade 2 diastolic dysfunction, normal RV, PASP 50 mmHg, probably mild mitral stenosis with mean gradient 7 mmHg and MVA 2 cm^2 by PHT, mild AS.  12. Coronary angiography 11/18 without significant disease.  - Coronary angiography in 1/19 with luminal irregularities 13. Mitral stenosis:  Mild by cath in 1/19, mean gradient 2.6 mmHg. Mild on 4/19 echo.  - Mild-moderate by echo in 4/20.  - Mild by echo in 4/21.  14. Aortic stenosis: Mild on 4/19 echo.  - Mild on 4/20 echo.  - Mild on 4/21 echo.  15. Fe deficiency anemia: EGD showed gastritis and c-scope showed diverticulosis and polyps in 4/19. Capsule endoscopy was unrevealing.  16. Pulmonary nodule: CT chest 7/19 with RUL nodule.  17. Low back pain/sciatica 18. COVID-19 PNA in 2/21.   Current Outpatient Medications  Medication Sig Dispense Refill  . atorvastatin (LIPITOR) 20 MG tablet Take 20 mg by mouth every morning.    . cetirizine (ZYRTEC) 10 MG tablet Take 10 mg by mouth daily as needed for allergies.    . fenofibrate 160 MG tablet Take 160 mg by mouth every morning.    . ferrous sulfate 325 (65 FE) MG tablet Take 325 mg by mouth daily with breakfast. Morning and night    .  furosemide (LASIX) 80 MG tablet Take 1 tablet (80 mg total) by mouth 2 (two) times daily. 60 tablet 6  . glimepiride (AMARYL) 4 MG tablet Take 4 mg by mouth daily with breakfast.    . levothyroxine (SYNTHROID, LEVOTHROID) 125 MCG tablet Take 125 mcg by mouth daily before breakfast.    . loperamide (IMODIUM A-D) 2 MG tablet Take 2 mg by mouth 4 (four) times daily as needed for diarrhea or loose stools.    Marland Kitchen loratadine (CLARITIN) 10 MG tablet Take 10 mg by mouth daily as needed for allergies.     . Multiple Vitamins-Minerals (PRESERVISION AREDS 2 PO) Take 1 capsule by mouth 2 (two) times daily.    . pantoprazole (PROTONIX) 40 MG tablet Take 40 mg by mouth every morning.    . potassium chloride (KLOR-CON) 10 MEQ tablet Take 10 mEq by mouth daily as needed.     . Riociguat 2.5 MG TABS Take 2.5 mg by mouth 3 (three) times daily. 270 tablet 3  . Selexipag 1600 MCG TABS Take 2,400 mcg by mouth 2 (two) times a day. Pt will take one 855mg and one 16075m twice a day. Order faxed to [facility] 60 tablet 11  . spironolactone (ALDACTONE) 25 MG tablet TAKE 1/2  TABLET BY MOUTH EVERY DAY 45 tablet 1  . UPTRAVI 800 MCG TABS Take one 160082mtablet and one 800m97mablet to make up 2400mc60mice daily. 180 tablet 3   No current facility-administered medications for this encounter.    Allergies:   Other and Adhesive [tape]   Social History:  The patient  reports that she has never smoked. She has never used smokeless tobacco. She reports that she does not drink alcohol or use drugs.   Family History:  The patient's family history is not on file.   ROS:  Please see the history of present illness.   All other systems are personally reviewed and negative.   Exam:   BP (!) 112/48   Pulse 70   Wt 75.3 kg (166 lb)   SpO2 91%   BMI 27.20 kg/m  PHYSICAL EXAM: General:  Well appearing elderly WF. No respiratory difficulty HEENT: normal Neck: supple.  JVD 8-9 cm. Carotids 2+ bilat; no bruits. No lymphadenopathy or thyromegaly appreciated. Cor: PMI nondisplaced. Regular rate & rhythm. 2/6 SM loudest RUSB Lungs: clear Abdomen: soft, nontender, nondistended. No hepatosplenomegaly. No bruits or masses. Good bowel sounds. Extremities: no cyanosis, clubbing, rash, 1+ bilateral LE edema Neuro: alert & oriented x 3, cranial nerves grossly intact. moves all 4 extremities w/o difficulty. Affect pleasant.   Recent Labs: 05/10/2019: B Natriuretic Peptide 540.6 01/15/2020: BUN 42; Creatinine, Ser 2.28; Hemoglobin 10.7; Platelets 357; Potassium 4.5; Sodium 138  Personally reviewed   Wt Readings from Last 3 Encounters:  02/06/20 75.3 kg (166 lb)  01/15/20 75.6 kg (166 lb 9.6 oz)  09/27/19 77.7 kg (171 lb 3.2 oz)    ASSESSMENT AND PLAN:  1. Pulmonary hypertension with RV failure:  Severe PAH with PVR 7 WU on 1/19 RHC.  Etiology uncertain.  She has a history of DVT remotely but V/Q scan in 2/19 did not show evidence for chronic PE. She has OSA and uses CPAP.  Serologic workup showed only positive ANA but when additional serologies were done, they were all negative  (see above).  CT chest with no ILD or emphysema.  Valvular heart disease does not appear significant enough to cause this degree of PH.  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  Echo in 4/19 showed that the RV is moderately dilated with mildly decreased systolic function, PA systolic pressure estimation was only 36 mmHg but poor envelope and likely underestimated. She did not tolerate Opsumit or ambrisentan due to significant lower extremity edema and worsening dyspnea.  [place] in 4/20 showed severe pulmonary hypertension with normal filling pressures and preserved cardiac output.  Echo in 4/21 showed normal RV with PASP 50 mmHg. She has been more short of breath since she had COVID-19.  Recently restarted on lasix 80 mg bid after running out of refills. Remains volume overloaded w/ 1+ bilateral LEE and elevated ReDs clip reading at 43%. NYHA Class III - Increase Lasix to 80 mg tid x 2 days. Check BMP today and again in 1 week + BNP in 1 week.  - No longer on metolazone w/ increase in SCr - Off spironolactone due to CKD - Continue Adempas, she is at goal.  [name] is up to 2400 mcg BID.   2. Syncope: Suspect related to severe pulmonary hypertension.  None recently.   3. Mitral stenosis: Noted on echo and cath, probably mild on 4/21 echo.  4. H/o DVT: No chronic PE on V/Q scan.   5. HTN: recently had hypotension requiring discontinuation of Losartan and spironolactone  - BP improved/ normalized today.  6. OSA: Continue CPAP qHS.  7. CKD: Stage 3.  Check BMP today.   8. Aortic stenosis: Mild on 4/21 echo.   9. H/o GI bleeding/ Chronic Anemia: Slow chronic GI bleed, source has not been found despite extensive workup.  - Repeat CBC today  - Continue FeSO4 - Continue Protonix   F/u in 4 weeks.   Signed, Lyda Jester, PA-C  02/06/2020   [facility] [place] [address] [phone] (office) [phone] (fax)

## 2020-02-06 NOTE — Progress Notes (Signed)
ReDS Vest / Clip - 02/06/20 1100      ReDS Vest / Clip   Station Marker  A    Ruler Value  30    ReDS Value Range  (!) High volume overload    ReDS Actual Value  43

## 2020-02-14 DIAGNOSIS — K5520 Angiodysplasia of colon without hemorrhage: Secondary | ICD-10-CM | POA: Insufficient documentation

## 2020-03-03 ENCOUNTER — Encounter (HOSPITAL_COMMUNITY): Payer: Self-pay

## 2020-03-03 ENCOUNTER — Other Ambulatory Visit: Payer: Self-pay

## 2020-03-03 ENCOUNTER — Ambulatory Visit (HOSPITAL_COMMUNITY)
Admission: RE | Admit: 2020-03-03 | Discharge: 2020-03-03 | Disposition: A | Discharge status: Home / Self Care | Payer: Medicare Other | Source: Ambulatory Visit | Attending: Cardiology | Admitting: Cardiology

## 2020-03-03 VITALS — BP 102/58 | HR 64 | Wt 160.0 lb

## 2020-03-03 DIAGNOSIS — I1310 Hypertensive heart and chronic kidney disease without heart failure, with stage 1 through stage 4 chronic kidney disease, or unspecified chronic kidney disease: Secondary | ICD-10-CM | POA: Insufficient documentation

## 2020-03-03 DIAGNOSIS — I350 Nonrheumatic aortic (valve) stenosis: Secondary | ICD-10-CM | POA: Insufficient documentation

## 2020-03-03 DIAGNOSIS — Z79891 Long term (current) use of opiate analgesic: Secondary | ICD-10-CM | POA: Insufficient documentation

## 2020-03-03 DIAGNOSIS — Z79890 Hormone replacement therapy (postmenopausal): Secondary | ICD-10-CM | POA: Insufficient documentation

## 2020-03-03 DIAGNOSIS — G4733 Obstructive sleep apnea (adult) (pediatric): Secondary | ICD-10-CM | POA: Diagnosis not present

## 2020-03-03 DIAGNOSIS — M109 Gout, unspecified: Secondary | ICD-10-CM | POA: Insufficient documentation

## 2020-03-03 DIAGNOSIS — I050 Rheumatic mitral stenosis: Secondary | ICD-10-CM | POA: Insufficient documentation

## 2020-03-03 DIAGNOSIS — E1122 Type 2 diabetes mellitus with diabetic chronic kidney disease: Secondary | ICD-10-CM | POA: Insufficient documentation

## 2020-03-03 DIAGNOSIS — D500 Iron deficiency anemia secondary to blood loss (chronic): Secondary | ICD-10-CM | POA: Insufficient documentation

## 2020-03-03 DIAGNOSIS — E039 Hypothyroidism, unspecified: Secondary | ICD-10-CM | POA: Insufficient documentation

## 2020-03-03 DIAGNOSIS — Z7952 Long term (current) use of systemic steroids: Secondary | ICD-10-CM | POA: Insufficient documentation

## 2020-03-03 DIAGNOSIS — R55 Syncope and collapse: Secondary | ICD-10-CM | POA: Diagnosis not present

## 2020-03-03 DIAGNOSIS — I272 Pulmonary hypertension, unspecified: Secondary | ICD-10-CM

## 2020-03-03 DIAGNOSIS — N183 Chronic kidney disease, stage 3 unspecified: Secondary | ICD-10-CM | POA: Insufficient documentation

## 2020-03-03 DIAGNOSIS — Z86718 Personal history of other venous thrombosis and embolism: Secondary | ICD-10-CM | POA: Diagnosis not present

## 2020-03-03 DIAGNOSIS — Z9981 Dependence on supplemental oxygen: Secondary | ICD-10-CM | POA: Insufficient documentation

## 2020-03-03 DIAGNOSIS — Z7984 Long term (current) use of oral hypoglycemic drugs: Secondary | ICD-10-CM | POA: Insufficient documentation

## 2020-03-03 DIAGNOSIS — Z8616 Personal history of COVID-19: Secondary | ICD-10-CM | POA: Diagnosis not present

## 2020-03-03 DIAGNOSIS — Z79899 Other long term (current) drug therapy: Secondary | ICD-10-CM | POA: Insufficient documentation

## 2020-03-03 DIAGNOSIS — Z9119 Patient's noncompliance with other medical treatment and regimen: Secondary | ICD-10-CM | POA: Diagnosis not present

## 2020-03-03 DIAGNOSIS — I739 Peripheral vascular disease, unspecified: Secondary | ICD-10-CM | POA: Insufficient documentation

## 2020-03-03 DIAGNOSIS — I5032 Chronic diastolic (congestive) heart failure: Secondary | ICD-10-CM | POA: Diagnosis not present

## 2020-03-03 DIAGNOSIS — E785 Hyperlipidemia, unspecified: Secondary | ICD-10-CM | POA: Insufficient documentation

## 2020-03-03 LAB — BASIC METABOLIC PANEL
Anion gap: 13 (ref 5–15)
BUN: 36 mg/dL — ABNORMAL HIGH (ref 8–23)
CO2: 24 mmol/L (ref 22–32)
Calcium: 8.8 mg/dL — ABNORMAL LOW (ref 8.9–10.3)
Chloride: 101 mmol/L (ref 98–111)
Creatinine, Ser: 1.9 mg/dL — ABNORMAL HIGH (ref 0.44–1.00)
GFR calc Af Amer: 29 mL/min — ABNORMAL LOW (ref >60)
GFR calc non Af Amer: 25 mL/min — ABNORMAL LOW (ref >60)
Glucose, Bld: 160 mg/dL — ABNORMAL HIGH (ref 70–99)
Potassium: 3.2 mmol/L — ABNORMAL LOW (ref 3.5–5.1)
Sodium: 138 mmol/L (ref 135–145)

## 2020-03-03 NOTE — Progress Notes (Signed)
Date:  03/03/2020   ID:  Alisha [name], DOB Jun 05, 1941, MRN [id number]   Provider location: [facility] Type of Visit: Established patient   PCP:  Alisha Gear, MD  Cardiologist:  Dr. Aundra Dubin   History of Present Illness: Alisha [name] is a 79 y.o. female with history of diabetes, HTN, PAD, hyperlipidemia who was referred by Dr. Wynonia [name] for evaluation of pulmonary hypertension.   For > 1 year, she has had significant exertional dyspnea. It has been steadily worsening, especially over the last few months.  She has been extensively worked up so far.  Echo in 4/18 showed preserved EF 65% with moderate pulmonary hypertension.  She had episodes of syncope in 5/18 and 10/18.  She wore an event monitor in 5/18 with no significant arrhythmia.  LHC/RHC in 1/19 showed normal PCWP and severely elevated PA pressure, no significant CAD.    At initial appointment in 1/19, she was noted to be hypoxemic with exertion and home oxygen was started for use with exertion.  I also started her on Opsumit. She had to stop this after about a week due to significantly increased exertional peripheral edema.  This resolved after Opsumit was stopped.   Echo in 4/19.  This showed EF 60-65%, mild MS, mild AS, moderately dilated RV with mildly decreased systolic function.   She was found to have Fe deficiency anemia, FOBT+.  EGD showed gastritis and c-scope showed diverticulosis and polyps in 4/19.  She get IV Fe.  She is going to have a capsule endoscopy.   She tried to start ambrisentan, but developed worsening dyspnea and peripheral edema and low oxygen saturation.  This improved after ambrisentan was stopped.   She is now taking riociguat and Uptravi.  RHC in 4/20 showed normal filling pressures but severe pulmonary hypertension with PVR 6 WU.  Echo showed moderately dilated RV with normal systolic function, mild-moderate MS, mild AS.  [name] has now been titrated up to 2400 mcg bid  and riociguat to 1.5 mg tid.    She had COVID-19 PNA in 2/21, hospitalized for about a week.   Echo in 4/21 showed EF 07-68%, grade 2 diastolic dysfunction, normal RV, PASP 50 mmHg, probably mild mitral stenosis with mean gradient 7 mmHg and MVA 2 cm^2 by PHT.   Seen by Dr. Aundra Dubin 01/15/20 and endorsed increased dyspnea and was volume overloaded after running out of her diuretics x 1 week. He restarted lasix 80 mg bid. Given slight bump in SCr to 2.28, she was advised not to go back on Metolazone. Due to low BP, her Losartan was decreased down to 50 mg daily, however she continued to have hypotension and this was discontinued all together. Spironolactone also discontinued due to CKD and hypotension.   I evaluated her on return f/u 5/27. Felt somewhat improved but continued to note exertional dyspnea. She continued w/ volume overload w/ 1+ bilateral LEE and elevated ReDs clip reading at 43%. She denied resting dyspnea. No CP. Reported full compliance w/ Lasix. BP was much improved off losartan. Had no further hypotension. I had her increase her lasix to 80 mg tid x 2 days, then reduce back down to 80 mg bid and gave Rx for compression stockings.   She returns today for f/u to reassess volume status. Volume improved. Wt down 6 lb since last visit. Continues w/ chronic dyspnea from [place], but unchanged from baseline. We discussed repeating 6 min walk test today but she declined,  due to knee pain. She was evaluated at an Urgent care 3 days ago for knee pain and diagnosed w/ gout. Was prescribed 4 day course of prednisone. Continues w/ mild pain but improving.     6 minute walk (1/19): 92 m, oxygen saturation dropped to 70%.   6 minute walk (3/19): 122 m  6 minute walk (4/19): 392 m 6 minute walk (9/19): 317 m 6 minute walk (1/20): 320 m 6 minute walk (8/20): 359 m 6 minute walk (10/20): 391 m  Labs (1/19): anti-Jo1 negative, RF borderline positive, SCL-70 negative, HIV negative. ESR 21. BNP 1129.    Labs (3/19): ANA + => anti-dsDNA negative, SSA/SSB negative, anti-Sm negative, anti-RNP negative. K 3.8 => 3.7, creatinine 1.4 => 1.55.  Labs (4/19): K 4.1 => 3.1, creatinine 1.58 => 1.2, BNP 1902, CCP Ab negative.  Labs (6/19): hgb 9, creatinine 1.66, K 3 Labs (7/19): K 4.9, creatinine 1.7 Labs (8/19): K 3.6, creatinine 1.36 Labs (9/19): K 4.1, creatinine 1.55, BNP 641 Labs (10/19): K 4.7, creatinine 1.88, BNP 595 Labs (1/20): Hgb 12.2 Labs (4/20): K 4.2, creatinine 2.2, hgb 8.4 Labs (7/20): K 3.2, creatinine 2.07, hgb 8 Labs (8/20): K 3.7, creatinine 1.46 Labs (9/20): K 4, creatinine 1.5 Labs (10/20): K 4.5, creatinine 1.75 Labs (2/21): K 4.1, creatinine 1.78 Labs (01/15/20): K 4.5, creatinine 2.28  Labs (5/27): K 3.1, creatinine 1.46  PMH: 1. PAD: Non-healing left ankle ulcer.  - ABIs 9/12 were normal.  2. Type 2 diabetes 3. Hypothyroidism 4. CKD stage 3 5. Hyperlipidemia 6. HTN 7. IBS 8. OSA: on CPAP.  9. Event monitor 5/18: No significant abnormality.  10. DVT in 1995 11. Pulmonary hypertension: Echo (4/18) with EF 65%, mild MR, mild TR, mild mitral stenosis, moderate pulmonary hypertension. - CTA chest 10/17: No PE.  - RHC (1/19): mean RA 11, PA 87/31 mean 51, mean PCWP 6, CI 3.58, PVR 7 WU.  - PFTs (1/19): mild obstruction, some restriction => severely decreased DLCO. - High resolution CT chest (1/19): no interstitial lung disease or emphysema. 7 mm nodule RUL.  - anti-Jo1 negative, RF borderline positive, SCL-70 negative, HIV negative. ANA + => anti-dsDNA negative, SSA/SSB negative, anti-Sm negative, anti-RNP negative. - V/Q scan 2/19: No e/o chronic PE.  - Echo (4/19): EF 60-65%, mild LVH, mild aortic stenosis, mild mitral stenosis, moderately dilated RV with mildly decreased systolic function, PASP 36 mmHg (poor envelope, may be underestimated).  - Does not tolerate ERAs (macitentan, ambrisentan).  - RHC (4/20): mean RA 5, PA 85/22 mean 51, mean PCWP 12, CI  3.5, PVR  6 WU.  - Echo (4/20): EF > 65%, moderate LVH, RV moderately dilated with normal systolic function, moderate pericardial effusion, mild-moderate mitral stenosis with mean gradient 7/MVA by PHT 2.48 cm^2, mild AS mean gradient 12.  - Echo (4/21): EF 37-04%, grade 2 diastolic dysfunction, normal RV, PASP 50 mmHg, probably mild mitral stenosis with mean gradient 7 mmHg and MVA 2 cm^2 by PHT, mild AS.  12. Coronary angiography 11/18 without significant disease.  - Coronary angiography in 1/19 with luminal irregularities 13. Mitral stenosis: Mild by cath in 1/19, mean gradient 2.6 mmHg. Mild on 4/19 echo.  - Mild-moderate by echo in 4/20.  - Mild by echo in 4/21.  14. Aortic stenosis: Mild on 4/19 echo.  - Mild on 4/20 echo.  - Mild on 4/21 echo.  15. Fe deficiency anemia: EGD showed gastritis and c-scope showed diverticulosis and polyps in 4/19. Capsule endoscopy was unrevealing.  16. Pulmonary nodule: CT chest 7/19 with RUL nodule.  17. Low back pain/sciatica 18. COVID-19 PNA in 2/21.   Current Outpatient Medications  Medication Sig Dispense Refill  . atorvastatin (LIPITOR) 20 MG tablet Take 20 mg by mouth every morning.    . cetirizine (ZYRTEC) 10 MG tablet Take 10 mg by mouth daily as needed for allergies.    . fenofibrate 160 MG tablet Take 160 mg by mouth every morning.    . ferrous sulfate 325 (65 FE) MG tablet Take 325 mg by mouth daily with breakfast. Morning and night    . furosemide (LASIX) 80 MG tablet Take 1 tablet (80 mg total) by mouth 2 (two) times daily. 60 tablet 6  . glimepiride (AMARYL) 4 MG tablet Take 4 mg by mouth daily with breakfast.    . levothyroxine (SYNTHROID, LEVOTHROID) 125 MCG tablet Take 125 mcg by mouth daily before breakfast.    . loperamide (IMODIUM A-D) 2 MG tablet Take 2 mg by mouth 4 (four) times daily as needed for diarrhea or loose stools.    Marland Kitchen loratadine (CLARITIN) 10 MG tablet Take 10 mg by mouth daily as needed for allergies.     . Multiple  Vitamins-Minerals (PRESERVISION AREDS 2 PO) Take 1 capsule by mouth 2 (two) times daily.    . pantoprazole (PROTONIX) 40 MG tablet Take 40 mg by mouth every morning.    . potassium chloride (KLOR-CON) 10 MEQ tablet Take 10 mEq by mouth daily as needed.     . predniSONE (STERAPRED UNI-PAK 21 TAB) 10 MG (21) TBPK tablet Take by mouth.    . Riociguat 2.5 MG TABS Take 2.5 mg by mouth 3 (three) times daily. 270 tablet 3  . Selexipag 1600 MCG TABS Take 2,400 mcg by mouth 2 (two) times a day. Pt will take one 894mg and one 1604m twice a day. Order faxed to [facility] 60 tablet 11  . spironolactone (ALDACTONE) 25 MG tablet TAKE 1/2 TABLET BY MOUTH EVERY DAY 45 tablet 1  . traMADol (ULTRAM) 50 MG tablet Take 50 mg by mouth 2 (two) times daily as needed.    . Marland KitchenPTRAVI 800 MCG TABS Take one 160065mtablet and one 800m52mablet to make up 2400mc33mice daily. 180 tablet 3   No current facility-administered medications for this encounter.    Allergies:   Other and Adhesive [tape]   Social History:  The patient  reports that she has never smoked. She has never used smokeless tobacco. She reports that she does not drink alcohol and does not use drugs.   Family History:  The patient's family history is not on file.   ROS:  Please see the history of present illness.   All other systems are personally reviewed and negative.   Vital Signs:   BP (!) 102/58   Pulse 64   Wt 72.6 kg   SpO2 93%   BMI 26.22 kg/m  PHYSICAL EXAM: General:  Well appearing. No respiratory difficulty HEENT: normal Neck: supple. no JVD. Carotids 2+ bilat; no bruits. No lymphadenopathy or thyromegaly appreciated. Cor: PMI nondisplaced. Regular rate & rhythm. 2/6 SM Lungs: clear bilaterally  Abdomen: soft, nontender, nondistended. No hepatosplenomegaly. No bruits or masses. Good bowel sounds. Extremities: no cyanosis, clubbing, rash, trace bilateral LE edema Neuro: alert & oriented x 3, cranial nerves grossly intact. moves all 4  extremities w/o difficulty. Affect pleasant.    Recent Labs: 05/10/2019: B Natriuretic Peptide 540.6 02/06/2020: BUN 39; Creatinine, Ser 1.46; Hemoglobin 8.0; Platelets  417; Potassium 3.1; Sodium 137  Personally reviewed   Wt Readings from Last 3 Encounters:  03/03/20 72.6 kg  02/06/20 75.3 kg  01/15/20 75.6 kg    ASSESSMENT AND PLAN:  1. Pulmonary hypertension with RV failure:  Severe PAH with PVR 7 WU on 1/19 RHC.  Etiology uncertain.  She has a history of DVT remotely but V/Q scan in 2/19 did not show evidence for chronic PE. She has OSA and uses CPAP.  Serologic workup showed only positive ANA but when additional serologies were done, they were all negative (see above).  CT chest with no ILD or emphysema.  Valvular heart disease does not appear significant enough to cause this degree of PH.  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  Echo in 4/19 showed that the RV is moderately dilated with mildly decreased systolic function, PA systolic pressure estimation was only 36 mmHg but poor envelope and likely underestimated. She did not tolerate Opsumit or ambrisentan due to significant lower extremity edema and worsening dyspnea.  Sun in 4/20 showed severe pulmonary hypertension with normal filling pressures and preserved cardiac output.  Echo in 4/21 showed normal RV with PASP 50 mmHg. She has been more short of breath since she had COVID-19.  Required recent temporary increase in lasix to 80 mg tid x 2 days.  - Volume status improved, only trace bilateral LEE on exam  - NYHA Class III, chronic  - Continue Lasix 80 mg bid - No longer on scheduled metolazone w/ increase in SCr (Scr had bump to >2) - Off spironolactone due to elevated SCr. This had improved on most recent repeat BMP, down to 1.49. Bump in SCr was associated w/ metolazone use. Will repeat BMP today. If SCr acceptable, will plan to restart low dose spironolactone 12.5 mg qhs w/ close f/u w/ pharmD.   - Continue Adempas, she is at goal.   [name] is up  to 2400 mcg BID.   2. Syncope: Suspect related to severe pulmonary hypertension.  None recently.   3. Mitral stenosis: Noted on echo and cath, probably mild on 4/21 echo.  4. H/o DVT: No chronic PE on V/Q scan.   5. HTN: recently had hypotension requiring discontinuation of Losartan and spironolactone  - BP improved/ normalized today. - if f/u BMP shows stable SCr, will plan spironolactone re-challenge but at low dose 12.5 mg qhs    6. OSA: noncompliant w/ CPAP. Was forced to return device due to poor compliance  7. CKD: Stage 3.  Check BMP today.   8. Aortic stenosis: Mild on 4/21 echo.  Will follow  9. H/o GI bleeding/ Chronic Anemia: Slow chronic GI bleed, source has not been found despite extensive workup.   - she denies any visible bleeding  - had recent CBC done at Urgent Care (I personally reviewed lab work). Hgb stable at 12.  10. Gout: left knee. Recent uric acic 14. Currently on 4 day course of prednisolone. Pain improving.   F/u w/ Dr. Aundra Dubin in 3-4 months   Signed, Lyda Jester, PA-C  03/03/2020   [address] [address] [phone] (office) [phone] (fax)

## 2020-03-03 NOTE — Patient Instructions (Signed)
Labs done today. We will contact you only if your labs are abnormal.   No medication changes were made. Please continue all current medications as prescribed.  Your physician recommends that you schedule a follow-up appointment in: 3-4 months with Dr. Aundra Dubin  If you have any questions or concerns before your next appointment please send [place] a message through [facility] or call our office at [phone].    TO LEAVE A MESSAGE FOR THE NURSE SELECT OPTION 2, PLEASE LEAVE A MESSAGE INCLUDING: . YOUR NAME . DATE OF BIRTH . CALL BACK NUMBER . REASON FOR CALL**this is important as we prioritize the call backs  [place] AS LONG AS YOU CALL BEFORE 4:00 PM    At the [facility], you and your health needs are our priority. As part of our continuing mission to provide you with exceptional heart care, we have created designated Provider Care Teams. These Care Teams include your primary Cardiologist (physician) and Advanced Practice Providers (APPs- Physician Assistants and Nurse Practitioners) who all work together to provide you with the care you need, when you need it.   You may see any of the following providers on your designated Care Team at your next follow up: Marland Kitchen Dr Glori Bickers . Dr Loralie Champagne . Darrick Grinder, NP . Lyda Jester, PA . Audry Riles, PharmD   Please be sure to bring in all your medications bottles to every appointment.

## 2020-03-10 ENCOUNTER — Other Ambulatory Visit (HOSPITAL_COMMUNITY): Payer: Self-pay

## 2020-03-10 MED ORDER — SELEXIPAG 1600 MCG PO TABS: 2400.0000 ug | ORAL_TABLET | Freq: Two times a day (BID) | ORAL | 11 refills | Status: DC

## 2020-03-10 NOTE — Telephone Encounter (Signed)
Meds ordered this encounter  Medications  . Selexipag 1600 MCG TABS    Sig: Take 1.5 tablets (2,400 mcg total) by mouth 2 (two) times daily. Pt will take one 843mcg and one 1644mcg twice a day.    Dispense:  60 tablet    Refill:  11

## 2020-03-12 ENCOUNTER — Telehealth (HOSPITAL_COMMUNITY): Payer: Self-pay

## 2020-03-12 ENCOUNTER — Other Ambulatory Visit (HOSPITAL_COMMUNITY): Payer: Self-pay

## 2020-03-12 DIAGNOSIS — I272 Pulmonary hypertension, unspecified: Secondary | ICD-10-CM

## 2020-03-12 DIAGNOSIS — I5032 Chronic diastolic (congestive) heart failure: Secondary | ICD-10-CM

## 2020-03-12 MED ORDER — POTASSIUM CHLORIDE ER 10 MEQ PO TBCR: 10.0000 meq | EXTENDED_RELEASE_TABLET | Freq: Every day | ORAL | 3 refills | Status: DC

## 2020-03-12 NOTE — Telephone Encounter (Signed)
-----   Message from Consuelo Pandy, [place] sent at 03/05/2020  6:43 PM EDT ----- SCr c/w baseline. Given level, will NOT restart spiro. K 3.2. add 10 mEq of KCL daily. Repeat BMP in 1 week.

## 2020-03-12 NOTE — Telephone Encounter (Signed)
Patient advised and verbalized understanding. Patient stated that she hasnt been taking her potassium but recently started back taking it. Lab appt scheduled,lab order entered.med list updated to reflect changes

## 2020-03-12 NOTE — Telephone Encounter (Signed)
Med list updated

## 2020-03-18 ENCOUNTER — Other Ambulatory Visit (HOSPITAL_COMMUNITY): Payer: Self-pay | Admitting: *Deleted

## 2020-03-18 MED ORDER — SELEXIPAG 1600 MCG PO TABS: 1600.0000 ug | ORAL_TABLET | Freq: Two times a day (BID) | ORAL | 11 refills | Status: DC

## 2020-03-20 ENCOUNTER — Ambulatory Visit (HOSPITAL_COMMUNITY)
Admission: RE | Admit: 2020-03-20 | Discharge: 2020-03-20 | Disposition: A | Discharge status: Home / Self Care | Payer: Medicare Other | Source: Ambulatory Visit | Attending: Cardiology | Admitting: Cardiology

## 2020-03-20 ENCOUNTER — Other Ambulatory Visit: Payer: Self-pay

## 2020-03-20 DIAGNOSIS — I272 Pulmonary hypertension, unspecified: Secondary | ICD-10-CM | POA: Diagnosis not present

## 2020-03-20 DIAGNOSIS — I5032 Chronic diastolic (congestive) heart failure: Secondary | ICD-10-CM | POA: Diagnosis present

## 2020-03-20 LAB — CBC
HCT: 40.6 % (ref 36.0–46.0)
Hemoglobin: 12.2 g/dL (ref 12.0–15.0)
MCH: 27.5 pg (ref 26.0–34.0)
MCHC: 30 g/dL (ref 30.0–36.0)
MCV: 91.4 fL (ref 80.0–100.0)
Platelets: 347 10*3/uL (ref 150–400)
RBC: 4.44 MIL/uL (ref 3.87–5.11)
RDW: 17.1 % — ABNORMAL HIGH (ref 11.5–15.5)
WBC: 11.1 10*3/uL — ABNORMAL HIGH (ref 4.0–10.5)
nRBC: 0 % (ref 0.0–0.2)

## 2020-03-20 LAB — BASIC METABOLIC PANEL
Anion gap: 10 (ref 5–15)
BUN: 25 mg/dL — ABNORMAL HIGH (ref 8–23)
CO2: 22 mmol/L (ref 22–32)
Calcium: 8.4 mg/dL — ABNORMAL LOW (ref 8.9–10.3)
Chloride: 110 mmol/L (ref 98–111)
Creatinine, Ser: 1.22 mg/dL — ABNORMAL HIGH (ref 0.44–1.00)
GFR calc Af Amer: 49 mL/min — ABNORMAL LOW (ref >60)
GFR calc non Af Amer: 43 mL/min — ABNORMAL LOW (ref >60)
Glucose, Bld: 173 mg/dL — ABNORMAL HIGH (ref 70–99)
Potassium: 3.7 mmol/L (ref 3.5–5.1)
Sodium: 142 mmol/L (ref 135–145)

## 2020-03-20 NOTE — Addendum Note (Signed)
Encounter addended by: Harvie Junior, CMA on: 03/20/2020 11:20 AM  Actions taken: Order list changed, Diagnosis association updated

## 2020-03-23 NOTE — Progress Notes (Signed)
SCr improved. K stable. Continue current regimen

## 2020-04-09 ENCOUNTER — Other Ambulatory Visit (HOSPITAL_COMMUNITY): Payer: Self-pay | Admitting: Cardiology

## 2020-04-11 ENCOUNTER — Encounter (HOSPITAL_COMMUNITY): Payer: Self-pay

## 2020-06-08 ENCOUNTER — Ambulatory Visit (HOSPITAL_COMMUNITY)
Admission: RE | Admit: 2020-06-08 | Discharge: 2020-06-08 | Disposition: A | Discharge status: Home / Self Care | Payer: Medicare Other | Source: Ambulatory Visit | Attending: Cardiology | Admitting: Cardiology

## 2020-06-08 ENCOUNTER — Other Ambulatory Visit: Payer: Self-pay

## 2020-06-08 VITALS — BP 130/58 | HR 95 | Wt 160.4 lb

## 2020-06-08 DIAGNOSIS — I130 Hypertensive heart and chronic kidney disease with heart failure and stage 1 through stage 4 chronic kidney disease, or unspecified chronic kidney disease: Secondary | ICD-10-CM | POA: Diagnosis not present

## 2020-06-08 DIAGNOSIS — I491 Atrial premature depolarization: Secondary | ICD-10-CM | POA: Diagnosis not present

## 2020-06-08 DIAGNOSIS — I272 Pulmonary hypertension, unspecified: Secondary | ICD-10-CM | POA: Insufficient documentation

## 2020-06-08 DIAGNOSIS — R55 Syncope and collapse: Secondary | ICD-10-CM | POA: Insufficient documentation

## 2020-06-08 DIAGNOSIS — K589 Irritable bowel syndrome without diarrhea: Secondary | ICD-10-CM | POA: Diagnosis not present

## 2020-06-08 DIAGNOSIS — E039 Hypothyroidism, unspecified: Secondary | ICD-10-CM | POA: Diagnosis not present

## 2020-06-08 DIAGNOSIS — I50810 Right heart failure, unspecified: Secondary | ICD-10-CM

## 2020-06-08 DIAGNOSIS — Z79899 Other long term (current) drug therapy: Secondary | ICD-10-CM | POA: Diagnosis not present

## 2020-06-08 DIAGNOSIS — Z86718 Personal history of other venous thrombosis and embolism: Secondary | ICD-10-CM | POA: Insufficient documentation

## 2020-06-08 DIAGNOSIS — I080 Rheumatic disorders of both mitral and aortic valves: Secondary | ICD-10-CM | POA: Insufficient documentation

## 2020-06-08 DIAGNOSIS — E785 Hyperlipidemia, unspecified: Secondary | ICD-10-CM | POA: Insufficient documentation

## 2020-06-08 DIAGNOSIS — Z7984 Long term (current) use of oral hypoglycemic drugs: Secondary | ICD-10-CM | POA: Diagnosis not present

## 2020-06-08 DIAGNOSIS — Z8616 Personal history of COVID-19: Secondary | ICD-10-CM | POA: Diagnosis not present

## 2020-06-08 DIAGNOSIS — E1122 Type 2 diabetes mellitus with diabetic chronic kidney disease: Secondary | ICD-10-CM | POA: Diagnosis not present

## 2020-06-08 DIAGNOSIS — I5032 Chronic diastolic (congestive) heart failure: Secondary | ICD-10-CM

## 2020-06-08 DIAGNOSIS — N183 Chronic kidney disease, stage 3 unspecified: Secondary | ICD-10-CM | POA: Diagnosis not present

## 2020-06-08 DIAGNOSIS — Z79890 Hormone replacement therapy (postmenopausal): Secondary | ICD-10-CM | POA: Diagnosis not present

## 2020-06-08 DIAGNOSIS — G4733 Obstructive sleep apnea (adult) (pediatric): Secondary | ICD-10-CM | POA: Diagnosis not present

## 2020-06-08 LAB — CBC
HCT: 39.3 % (ref 36.0–46.0)
Hemoglobin: 12 g/dL (ref 12.0–15.0)
MCH: 27.3 pg (ref 26.0–34.0)
MCHC: 30.5 g/dL (ref 30.0–36.0)
MCV: 89.5 fL (ref 80.0–100.0)
Platelets: 338 10*3/uL (ref 150–400)
RBC: 4.39 MIL/uL (ref 3.87–5.11)
RDW: 17 % — ABNORMAL HIGH (ref 11.5–15.5)
WBC: 8.4 10*3/uL (ref 4.0–10.5)
nRBC: 0 % (ref 0.0–0.2)

## 2020-06-08 LAB — LIPID PANEL
Cholesterol: 112 mg/dL (ref 0–200)
HDL: 43 mg/dL (ref >40)
LDL Cholesterol: 42 mg/dL (ref 0–99)
Total CHOL/HDL Ratio: 2.6 RATIO
Triglycerides: 137 mg/dL (ref <150)
VLDL: 27 mg/dL (ref 0–40)

## 2020-06-08 LAB — BASIC METABOLIC PANEL
Anion gap: 12 (ref 5–15)
BUN: 29 mg/dL — ABNORMAL HIGH (ref 8–23)
CO2: 21 mmol/L — ABNORMAL LOW (ref 22–32)
Calcium: 9 mg/dL (ref 8.9–10.3)
Chloride: 106 mmol/L (ref 98–111)
Creatinine, Ser: 1.53 mg/dL — ABNORMAL HIGH (ref 0.44–1.00)
GFR calc Af Amer: 38 mL/min — ABNORMAL LOW (ref >60)
GFR calc non Af Amer: 32 mL/min — ABNORMAL LOW (ref >60)
Glucose, Bld: 160 mg/dL — ABNORMAL HIGH (ref 70–99)
Potassium: 3.7 mmol/L (ref 3.5–5.1)
Sodium: 139 mmol/L (ref 135–145)

## 2020-06-08 MED ORDER — METOLAZONE 2.5 MG PO TABS
2.5000 mg | ORAL_TABLET | ORAL | 6 refills | Status: DC
Start: 2020-06-08 — End: 2021-07-24

## 2020-06-08 MED ORDER — POTASSIUM CHLORIDE ER 10 MEQ PO TBCR: 10.0000 meq | EXTENDED_RELEASE_TABLET | Freq: Every day | ORAL | 3 refills | Status: DC

## 2020-06-08 NOTE — Progress Notes (Signed)
6 Min Walk Test Completed  Pt ambulated 1100 ft (335.3 m) O2 Sat ranged 90-96% on RA HR ranged 87-115

## 2020-06-08 NOTE — Patient Instructions (Addendum)
START Metolazone 2.5mg  once a week   TAKE POTASSIUM 20MEQ (2 TABLETS) WITH METOLAZONE EVERY WEEK  Labs done today, your results will be available in MyChart, we will contact you for abnormal readings.  Your physician recommends that you return for a follow up visit in 3 months  If you have any questions or concerns before your next appointment please send [place] a message through [facility] or call our office at [phone].    TO LEAVE A MESSAGE FOR THE NURSE SELECT OPTION 2, PLEASE LEAVE A MESSAGE INCLUDING: . YOUR NAME . DATE OF BIRTH . CALL BACK NUMBER . REASON FOR CALL**this is important as we prioritize the call backs  [place] AS LONG AS YOU CALL BEFORE 4:00 PM  At the [facility], you and your health needs are our priority. As part of our continuing mission to provide you with exceptional heart care, we have created designated Provider Care Teams. These Care Teams include your primary Cardiologist (physician) and Advanced Practice Providers (APPs- Physician Assistants and Nurse Practitioners) who all work together to provide you with the care you need, when you need it.   You may see any of the following providers on your designated Care Team at your next follow up: Marland Kitchen Dr Glori Bickers . Dr Loralie Champagne . Darrick Grinder, NP . Lyda Jester, PA . Audry Riles, PharmD   Please be sure to bring in all your medications bottles to every appointment.

## 2020-06-09 NOTE — Progress Notes (Signed)
Date:  06/09/2020   ID:  Alisha [name], DOB 1940-10-09, MRN [id number]   Provider location: [facility] Type of Visit: Established patient   PCP:  Neila Gear, MD  Cardiologist:  Dr. Aundra Dubin   History of Present Illness: Alisha [name] is a 79 y.o. female with history of diabetes, HTN, PAD, hyperlipidemia who was referred by Dr. Wynonia Lawman for evaluation of pulmonary hypertension.   For > 1 year, she has had significant exertional dyspnea. It has been steadily worsening, especially over the last few months.  She has been extensively worked up so far.  Echo in 4/18 showed preserved EF 65% with moderate pulmonary hypertension.  She had episodes of syncope in 5/18 and 10/18.  She wore an event monitor in 5/18 with no significant arrhythmia.  LHC/RHC in 1/19 showed normal PCWP and severely elevated PA pressure, no significant CAD.    At initial appointment in 1/19, she was noted to be hypoxemic with exertion and home oxygen was started for use with exertion.  I also started her on Opsumit. She had to stop this after about a week due to significantly increased exertional peripheral edema.  This resolved after Opsumit was stopped.   Echo in 4/19.  This showed EF 60-65%, mild MS, mild AS, moderately dilated RV with mildly decreased systolic function.   She was found to have Fe deficiency anemia, FOBT+.  EGD showed gastritis and c-scope showed diverticulosis and polyps in 4/19.  She get IV Fe.  She is going to have a capsule endoscopy.   She tried to start ambrisentan, but developed worsening dyspnea and peripheral edema and low oxygen saturation.  This improved after ambrisentan was stopped.   She is now taking riociguat and Uptravi.  RHC in 4/20 showed normal filling pressures but severe pulmonary hypertension with PVR 6 WU.  Echo showed moderately dilated RV with normal systolic function, mild-moderate MS, mild AS.  [name] has now been titrated up to 2400 mcg bid  and riociguat to 1.5 mg tid.    She had COVID-19 PNA in 2/21, hospitalized for about a week.   Echo in 4/21 showed EF 22-97%, grade 2 diastolic dysfunction, normal RV, PASP 50 mmHg, probably mild mitral stenosis with mean gradient 7 mmHg and MVA 2 cm^2 by PHT.   She returns for followup of diastolic CHF and pulmonary hypertension.  She is finally feeling back to normal after COVID-19 infection over the winter.  Main complaint is knee pain, which limits her ambulation.  No dyspnea walking on flat ground.  Went to [place] recently and able to walk across airport.  No chest pain. No lightheadedness/syncope.    ECG (personally reviewed): NSR, PACs, LPFB  6 minute walk (1/19): 92 m, oxygen saturation dropped to 70%.   6 minute walk (3/19): 122 m 6 minute walk (4/19): 392 m 6 minute walk (9/19): 317 m 6 minute walk (1/20): 320 m 6 minute walk (8/20): 359 m 6 minute walk (10/20): 391 m 6 minute walk (9/21): 335 m, oxygen saturation 90-96%  Labs (1/19): anti-Jo1 negative, RF borderline positive, SCL-70 negative, HIV negative. ESR 21. BNP 1129.  Labs (3/19): ANA + => anti-dsDNA negative, SSA/SSB negative, anti-Sm negative, anti-RNP negative. K 3.8 => 3.7, creatinine 1.4 => 1.55.  Labs (4/19): K 4.1 => 3.1, creatinine 1.58 => 1.2, BNP 1902, CCP Ab negative.  Labs (6/19): hgb 9, creatinine 1.66, K 3 Labs (7/19): K 4.9, creatinine 1.7 Labs (8/19): K 3.6, creatinine  1.36 Labs (9/19): K 4.1, creatinine 1.55, BNP 641 Labs (10/19): K 4.7, creatinine 1.88, BNP 595 Labs (1/20): Hgb 12.2 Labs (4/20): K 4.2, creatinine 2.2, hgb 8.4 Labs (7/20): K 3.2, creatinine 2.07, hgb 8 Labs (8/20): K 3.7, creatinine 1.46 Labs (9/20): K 4, creatinine 1.5 Labs (10/20): K 4.5, creatinine 1.75 Labs (2/21): K 4.1, creatinine 1.78 Labs (7/21): K 3.7, creatinine 1.22, hgb 12.2  PMH: 1. PAD: Non-healing left ankle ulcer.  - ABIs 9/12 were normal.  2. Type 2 diabetes 3. Hypothyroidism 4. CKD stage 3 5.  Hyperlipidemia 6. HTN 7. IBS 8. OSA: on CPAP.  9. Event monitor 5/18: No significant abnormality.  10. DVT in 1995 11. Pulmonary hypertension: Echo (4/18) with EF 65%, mild MR, mild TR, mild mitral stenosis, moderate pulmonary hypertension. - CTA chest 10/17: No PE.  - RHC (1/19): mean RA 11, PA 87/31 mean 51, mean PCWP 6, CI 3.58, PVR 7 WU.  - PFTs (1/19): mild obstruction, some restriction => severely decreased DLCO. - High resolution CT chest (1/19): no interstitial lung disease or emphysema. 7 mm nodule RUL.  - anti-Jo1 negative, RF borderline positive, SCL-70 negative, HIV negative. ANA + => anti-dsDNA negative, SSA/SSB negative, anti-Sm negative, anti-RNP negative. - V/Q scan 2/19: No e/o chronic PE.  - Echo (4/19): EF 60-65%, mild LVH, mild aortic stenosis, mild mitral stenosis, moderately dilated RV with mildly decreased systolic function, PASP 36 mmHg (poor envelope, may be underestimated).  - Does not tolerate ERAs (macitentan, ambrisentan).  - RHC (4/20): mean RA 5, PA 85/22 mean 51, mean PCWP 12, CI  3.5, PVR 6 WU.  - Echo (4/20): EF > 65%, moderate LVH, RV moderately dilated with normal systolic function, moderate pericardial effusion, mild-moderate mitral stenosis with mean gradient 7/MVA by PHT 2.48 cm^2, mild AS mean gradient 12.  - Echo (4/21): EF 33-43%, grade 2 diastolic dysfunction, normal RV, PASP 50 mmHg, probably mild mitral stenosis with mean gradient 7 mmHg and MVA 2 cm^2 by PHT, mild AS.  12. Coronary angiography 11/18 without significant disease.  - Coronary angiography in 1/19 with luminal irregularities 13. Mitral stenosis: Mild by cath in 1/19, mean gradient 2.6 mmHg. Mild on 4/19 echo.  - Mild-moderate by echo in 4/20.  - Mild by echo in 4/21.  14. Aortic stenosis: Mild on 4/19 echo.  - Mild on 4/20 echo.  - Mild on 4/21 echo.  15. Fe deficiency anemia: EGD showed gastritis and c-scope showed diverticulosis and polyps in 4/19. Capsule endoscopy was  unrevealing.  16. Pulmonary nodule: CT chest 7/19 with RUL nodule.  17. Low back pain/sciatica 18. COVID-19 PNA in 2/21.   Current Outpatient Medications  Medication Sig Dispense Refill  . atorvastatin (LIPITOR) 20 MG tablet Take 20 mg by mouth every morning.    . cetirizine (ZYRTEC) 10 MG tablet Take 10 mg by mouth daily as needed for allergies.    . fenofibrate 160 MG tablet Take 160 mg by mouth every morning.    . ferrous sulfate 325 (65 FE) MG tablet Take 325 mg by mouth daily with breakfast. Morning and night    . furosemide (LASIX) 80 MG tablet Take 1 tablet (80 mg total) by mouth 2 (two) times daily. 60 tablet 6  . glimepiride (AMARYL) 4 MG tablet Take 4 mg by mouth daily with breakfast.    . levothyroxine (SYNTHROID, LEVOTHROID) 125 MCG tablet Take 125 mcg by mouth daily before breakfast.    . loperamide (IMODIUM A-D) 2 MG tablet Take  2 mg by mouth 4 (four) times daily as needed for diarrhea or loose stools.    Marland Kitchen loratadine (CLARITIN) 10 MG tablet Take 10 mg by mouth daily as needed for allergies.     . Multiple Vitamins-Minerals (PRESERVISION AREDS 2 PO) Take 1 capsule by mouth 2 (two) times daily.    . pantoprazole (PROTONIX) 40 MG tablet Take 40 mg by mouth every morning.    . potassium chloride (KLOR-CON) 10 MEQ tablet Take 1 tablet (10 mEq total) by mouth daily. Take an extra 46mq (2 tablets) every week with metolazone 38 tablet 3  . Riociguat 2.5 MG TABS Take 2.5 mg by mouth 3 (three) times daily. 270 tablet 3  . Selexipag 1600 MCG TABS Take 1 tablet (1,600 mcg total) by mouth 2 (two) times daily. Take along with 800 mcg tabs to equal 2400 mcg Twice daily 60 tablet 11  . traMADol (ULTRAM) 50 MG tablet Take 50 mg by mouth 2 (two) times daily as needed.    .Marland KitchenUPTRAVI 800 MCG TABS Take one 16060m tablet and one 80035mtablet to make up 2400m43mwice daily. 180 tablet 3  . metolazone (ZAROXOLYN) 2.5 MG tablet Take 1 tablet (2.5 mg total) by mouth once a week. Take potassium 20me67mith this 4 tablet 6   No current facility-administered medications for this encounter.    Allergies:   Other and Adhesive [tape]   Social History:  The patient  reports that she has never smoked. She has never used smokeless tobacco. She reports that she does not drink alcohol and does not use drugs.   Family History:  The patient's family history is not on file.   ROS:  Please see the history of present illness.   All other systems are personally reviewed and negative.   Exam:   BP (!) 130/58   Pulse 95   Wt 72.7 kg (160 lb 6 oz)   SpO2 96%   BMI 26.28 kg/m  General: NAD Neck: JVP 10-11 cm, no thyromegaly or thyroid nodule.  Lungs: Mild crackles at bases.  CV: Nondisplaced PMI.  Heart regular S1/S2, no S3/S4,3/6 SEM RUSB with clear S2.  1+ edema 1/3 to knees bilaterally.   Abdomen: Soft, nontender, no hepatosplenomegaly, no distention.  Skin: Intact without lesions or rashes.  Neurologic: Alert and oriented x 3.  Psych: Normal affect. Extremities: No clubbing or cyanosis.  HEENT: Normal.   Recent Labs: 06/08/2020: BUN 29; Creatinine, Ser 1.53; Hemoglobin 12.0; Platelets 338; Potassium 3.7; Sodium 139  Personally reviewed   Wt Readings from Last 3 Encounters:  06/08/20 72.7 kg (160 lb 6 oz)  03/03/20 72.6 kg (160 lb)  02/06/20 75.3 kg (166 lb)    ASSESSMENT AND PLAN:  1. Pulmonary hypertension with RV failure:  Severe PAH with PVR 7 WU on 1/19 RHC.  Etiology uncertain.  She has a history of DVT remotely but V/Q scan in 2/19 did not show evidence for chronic PE. She has OSA and uses CPAP.  Serologic workup showed only positive ANA but when additional serologies were done, they were all negative (see above).  CT chest with no ILD or emphysema.  Valvular heart disease does not appear significant enough to cause this degree of PH.  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  Echo in 4/19 showed that the RV is moderately dilated with mildly decreased systolic function, PA systolic pressure estimation  was only 36 mmHg but poor envelope and likely underestimated. She did not tolerate Opsumit or ambrisentan  due to significant lower extremity edema and worsening dyspnea.  Klein in 4/20 showed severe pulmonary hypertension with normal filling pressures and preserved cardiac output.  Echo in 4/21 showed normal RV with PASP 50 mmHg. Symptomatically improved.  6 minute walk less than last year but also with significant knee pain today.  She does have some volume overload on exam. - Continue Adempas, she is at goal.  [name] is up to 2400 mcg BID.   - Continue Lasix 80 mg bid. - Restart metolazone 2.5 qwk with an extra KCl 20 on metolazone days.  BMET today and in 10 days.  2. Syncope: Suspect related to severe pulmonary hypertension.  None recently.   3. Mitral stenosis: Noted on echo and cath, probably mild on 4/21 echo.  4. H/o DVT: No chronic PE on V/Q scan.   5. HTN: BP controlled, she is now off losartan.  6. OSA: Continue CPAP qHS.  7. CKD: Stage 3.  BMET today.   8. Aortic stenosis: Mild on 4/21 echo.   9. H/o GI bleeding: Slow chronic GI bleed, source has not been found despite extensive workup.  - CBC today.   Followup in 3 months.  Signed, Loralie Champagne, MD  06/09/2020   [facility] [address] [facility] [place] [address] [phone] (office) [phone] (fax)

## 2020-06-12 ENCOUNTER — Ambulatory Visit: Payer: Medicare Other | Admitting: Medical

## 2020-07-03 ENCOUNTER — Ambulatory Visit (INDEPENDENT_AMBULATORY_CARE_PROVIDER_SITE_OTHER): Payer: Medicare Other | Admitting: Medical

## 2020-07-03 ENCOUNTER — Other Ambulatory Visit: Payer: Self-pay

## 2020-07-03 ENCOUNTER — Encounter: Payer: Self-pay | Admitting: Medical

## 2020-07-03 ENCOUNTER — Ambulatory Visit (HOSPITAL_BASED_OUTPATIENT_CLINIC_OR_DEPARTMENT_OTHER)
Admission: RE | Admit: 2020-07-03 | Discharge: 2020-07-03 | Disposition: A | Discharge status: Home / Self Care | Payer: Medicare Other | Source: Ambulatory Visit | Attending: Medical | Admitting: Medical

## 2020-07-03 VITALS — BP 118/44 | HR 94 | Temp 98.0°F | Resp 18 | Ht 65.5 in | Wt 157.4 lb

## 2020-07-03 DIAGNOSIS — E1151 Type 2 diabetes mellitus with diabetic peripheral angiopathy without gangrene: Secondary | ICD-10-CM | POA: Diagnosis not present

## 2020-07-03 DIAGNOSIS — L989 Disorder of the skin and subcutaneous tissue, unspecified: Secondary | ICD-10-CM

## 2020-07-03 DIAGNOSIS — M25559 Pain in unspecified hip: Secondary | ICD-10-CM | POA: Insufficient documentation

## 2020-07-03 DIAGNOSIS — G8929 Other chronic pain: Secondary | ICD-10-CM | POA: Insufficient documentation

## 2020-07-03 DIAGNOSIS — I119 Hypertensive heart disease without heart failure: Secondary | ICD-10-CM | POA: Diagnosis not present

## 2020-07-03 DIAGNOSIS — I5032 Chronic diastolic (congestive) heart failure: Secondary | ICD-10-CM

## 2020-07-03 DIAGNOSIS — M25562 Pain in left knee: Secondary | ICD-10-CM

## 2020-07-03 DIAGNOSIS — M25561 Pain in right knee: Secondary | ICD-10-CM | POA: Diagnosis present

## 2020-07-03 DIAGNOSIS — E039 Hypothyroidism, unspecified: Secondary | ICD-10-CM

## 2020-07-03 DIAGNOSIS — D619 Aplastic anemia, unspecified: Secondary | ICD-10-CM

## 2020-07-03 MED ORDER — AMLODIPINE BESYLATE 5 MG PO TABS: 5.0000 mg | ORAL_TABLET | Freq: Every day | ORAL | 3 refills | Status: DC

## 2020-07-03 NOTE — Progress Notes (Signed)
Subjective:    Patient ID: Alisha [name], female    DOB: [birth year], 79 y.o.   MRN: [id number]  HPI  Pt in for first time.  Pt recently move [place] to [place].  Pt has hx of chf, htn, gerd, low thyroid, anemia and is diabetic.  Chf hx. Sees Dr. Aundra Dubin.  Gerd- on protonix. Controlled.  Hx of arthritis-states knees hurt all the time. Recent hip pain. Pt states pain is excrutiatng at times. No relief with tylenol. Can't take nsaids due to anemia, possible gi source chronic bleed, htn and decreased gfr. Pt states has been doing PT for her knees.   Decreased gfr in past.  Low thyroid history and is on levothyroxine.  Has high cholesterol and last level 3 weeks ago shows good levels.  Pt states last a1c was about 6.7. Pt is on glimepiride.   Review of Systems  Constitutional: Negative for chills, fatigue and fever.  Respiratory: Negative for cough, chest tightness, shortness of breath and wheezing.   Cardiovascular: Negative for chest pain and palpitations.  Gastrointestinal: Negative for abdominal pain.  Musculoskeletal: Negative for back pain.       Knee pain. occasional left hip pain.  Hematological: Negative for adenopathy. Does not bruise/bleed easily.  Psychiatric/Behavioral: Negative for behavioral problems.    Past Medical History:  Diagnosis Date  . Aortic atherosclerosis ([place]) 08/30/2017  . Aortic stenosis 08/30/2017  . CAD (coronary artery disease), native coronary artery 08/30/2017  . Chronic diastolic heart failure ([place]) 08/30/2017  . Diabetes mellitus with peripheral vascular disease ([place]) 07/14/2014  . Hypertensive heart disease without CHF 08/30/2017  . Hypothyroidism (acquired) 08/30/2017  . Pulmonary hypertension ([place]) 07/04/2017     Social History   Socioeconomic History  . Marital status: Divorced    Spouse name: Not on file  . Number of children: Not on file  . Years of education: Not on file  . Highest education level: Not on file    Occupational History  . Not on file  Tobacco Use  . Smoking status: Never Smoker  . Smokeless tobacco: Never Used  Substance and Sexual Activity  . Alcohol use: No  . Drug use: No  . Sexual activity: Never  Other Topics Concern  . Not on file  Social History Narrative  . Not on file   Social Determinants of Health   Financial Resource Strain:   . Difficulty of Paying Living Expenses: Not on file  Food Insecurity:   . Worried About Charity fundraiser in the Last Year: Not on file  . Ran Out of Food in the Last Year: Not on file  Transportation Needs:   . Lack of Transportation (Medical): Not on file  . Lack of Transportation (Non-Medical): Not on file  Physical Activity:   . Days of Exercise per Week: Not on file  . Minutes of Exercise per Session: Not on file  Stress:   . Feeling of Stress : Not on file  Social Connections:   . Frequency of Communication with Friends and Family: Not on file  . Frequency of Social Gatherings with Friends and Family: Not on file  . Attends Religious Services: Not on file  . Active Member of Clubs or Organizations: Not on file  . Attends Archivist Meetings: Not on file  . Marital Status: Not on file  Intimate Partner Violence:   . Fear of Current or Ex-Partner: Not on file  . Emotionally Abused: Not on file  .  Physically Abused: Not on file  . Sexually Abused: Not on file    Past Surgical History:  Procedure Laterality Date  . ANKLE SURGERY    . APPENDECTOMY    . DILATION AND CURETTAGE OF UTERUS    . RIGHT HEART CATH N/A 01/01/2019   Procedure: RIGHT HEART CATH;  Surgeon: Larey Dresser, MD;  Location: [facility];  Service: Cardiovascular;  Laterality: N/A;  . RIGHT/LEFT HEART CATH AND CORONARY ANGIOGRAPHY N/A 09/27/2017   Procedure: RIGHT/LEFT HEART CATH AND CORONARY ANGIOGRAPHY;  Surgeon: Sherren Mocha, MD;  Location: [facility];  Service: Cardiovascular;  Laterality: N/A;    No family history on  file.  Allergies  Allergen Reactions  . Other Palpitations    Surgical metal (stents, pins, plates) keeps pt from healing  . Adhesive [Tape] Other (See Comments)    Irritates skin with rash    Current Outpatient Medications on File Prior to Visit  Medication Sig Dispense Refill  . atorvastatin (LIPITOR) 20 MG tablet Take 20 mg by mouth every morning.    . cetirizine (ZYRTEC) 10 MG tablet Take 10 mg by mouth daily as needed for allergies.    . fenofibrate 160 MG tablet Take 160 mg by mouth every morning.    . ferrous sulfate 325 (65 FE) MG tablet Take 325 mg by mouth daily with breakfast. Morning and night    . furosemide (LASIX) 80 MG tablet Take 1 tablet (80 mg total) by mouth 2 (two) times daily. 60 tablet 6  . glimepiride (AMARYL) 4 MG tablet Take 4 mg by mouth daily with breakfast.    . levothyroxine (SYNTHROID, LEVOTHROID) 125 MCG tablet Take 125 mcg by mouth daily before breakfast.    . loperamide (IMODIUM A-D) 2 MG tablet Take 2 mg by mouth 4 (four) times daily as needed for diarrhea or loose stools.    Marland Kitchen loratadine (CLARITIN) 10 MG tablet Take 10 mg by mouth daily as needed for allergies.     . metolazone (ZAROXOLYN) 2.5 MG tablet Take 1 tablet (2.5 mg total) by mouth once a week. Take potassium 57meq with this 4 tablet 6  . Multiple Vitamins-Minerals (PRESERVISION AREDS 2 PO) Take 1 capsule by mouth 2 (two) times daily.    . pantoprazole (PROTONIX) 40 MG tablet Take 40 mg by mouth every morning.    . potassium chloride (KLOR-CON) 10 MEQ tablet Take 1 tablet (10 mEq total) by mouth daily. Take an extra 29meq (2 tablets) every week with metolazone 38 tablet 3  . Riociguat 2.5 MG TABS Take 2.5 mg by mouth 3 (three) times daily. 270 tablet 3  . Selexipag 1600 MCG TABS Take 1 tablet (1,600 mcg total) by mouth 2 (two) times daily. Take along with 800 mcg tabs to equal 2400 mcg Twice daily 60 tablet 11  . traMADol (ULTRAM) 50 MG tablet Take 50 mg by mouth 2 (two) times daily as needed.     Marland Kitchen UPTRAVI 800 MCG TABS Take one 1613mcg tablet and one 821mcg tablet to make up 2468mcg twice daily. 180 tablet 3   No current facility-administered medications on file prior to visit.    BP (!) 118/44   Pulse 94   Temp 98 F (36.7 C) (Oral)   Resp 18   Ht 5' 5.5" (1.664 m)   Wt 157 lb 6.4 oz (71.4 kg)   SpO2 94%   BMI 25.79 kg/m       Objective:   Physical Exam  General Mental Status- Alert. General Appearance- Not in acute distress.     Neck No jvd.  Chest and Lung Exam Auscultation: Breath Sounds:-Normal.  Cardiovascular Auscultation:Rythm- Regular. Murmurs & Other Heart Sounds:Auscultation of the heart reveals- murmur. Aortic stenosis.  Abdomen Inspection:-Inspeection Normal. Palpation/Percussion:Note:No mass. Palpation and Percussion of the abdomen reveal- Non Tender, Non Distended + BS, no rebound or guarding.   Neurologic Cranial Nerve exam:- CN III-XII intact(No nystagmus), symmetric smile. trength:- 5/5 equal and symmetric strength both upper and lower extremities.   Knee- mild swelling but no obviouss crepitus on flexion and extension.  Skin-mild faint lesion bridge of nose.     Assessment & Plan:  History of CHF and hypertension, continue current meds amlodipine, Zaroxolyn and furosemide.  For hypothyroid will get TSH and T4 today.  See if dose changes need to be done for thyroid med.  For diabetes, continue glimepiride.  Will get CMP and A1c today.  GERD controlled with Protonix.  For history of intermittent chronic severe bilateral knee pain and some recent left hip pain, placed x-ray orders.  Med choices for pain limited as we discussed.  Currently you can try to Tylenol over-the-counter and you could try 1-2 tramadol.  Typically sedation can occur in your age group but glad to hear that 1 tablet does not have any sedative effects.  For skin lesion bridge of nose refer to dermatologist.  History of anemia will get CBC.  Follow-up in 2  weeks or as needed.  Mackie Pai, PA-C   Time spent with new  patient today was  37 minutes which consisted of chart review, discussing diagnosis, work up, treatment and documentation.

## 2020-07-03 NOTE — Patient Instructions (Signed)
History of CHF and hypertension, continue current meds amlodipine, Zaroxolyn and furosemide.  For hypothyroid will get TSH and T4 today.  See if dose changes need to be done for thyroid med.  For diabetes, continue glimepiride.  Will get CMP and A1c today.  GERD controlled with Protonix.  For history of intermittent chronic severe bilateral knee pain and some recent left hip pain, placed x-ray orders.  Med choices for pain limited as we discussed.  Currently you can try to Tylenol over-the-counter and you could try 1-2 tramadol.  Typically sedation can occur in your age group but glad to hear that 1 tablet does not have any sedative effects.  For skin lesion bridge of nose refer to dermatologist.  History of anemia will get CBC.  Follow-up in 2 weeks or as needed.

## 2020-07-04 ENCOUNTER — Encounter: Payer: Self-pay | Admitting: Medical

## 2020-07-04 ENCOUNTER — Telehealth: Payer: Self-pay | Admitting: Medical

## 2020-07-04 DIAGNOSIS — D619 Aplastic anemia, unspecified: Secondary | ICD-10-CM

## 2020-07-04 LAB — CBC WITH DIFFERENTIAL/PLATELET
Absolute Monocytes: 323 cells/uL (ref 200–950)
Basophils Absolute: 54 cells/uL (ref 0–200)
Basophils Relative: 0.7 %
Eosinophils Absolute: 131 cells/uL (ref 15–500)
Eosinophils Relative: 1.7 %
HCT: 28.6 % — ABNORMAL LOW (ref 35.0–45.0)
Hemoglobin: 8.9 g/dL — ABNORMAL LOW (ref 11.7–15.5)
Lymphs Abs: 716 cells/uL — ABNORMAL LOW (ref 850–3900)
MCH: 27.4 pg (ref 27.0–33.0)
MCHC: 31.1 g/dL — ABNORMAL LOW (ref 32.0–36.0)
MCV: 88 fL (ref 80.0–100.0)
MPV: 9.9 fL (ref 7.5–12.5)
Monocytes Relative: 4.2 %
Neutro Abs: 6476 cells/uL (ref 1500–7800)
Neutrophils Relative %: 84.1 %
Platelets: 402 10*3/uL — ABNORMAL HIGH (ref 140–400)
RBC: 3.25 10*6/uL — ABNORMAL LOW (ref 3.80–5.10)
RDW: 15.5 % — ABNORMAL HIGH (ref 11.0–15.0)
Total Lymphocyte: 9.3 %
WBC: 7.7 10*3/uL (ref 3.8–10.8)

## 2020-07-04 LAB — COMPLETE METABOLIC PANEL WITH GFR
AG Ratio: 1.3 (calc) (ref 1.0–2.5)
ALT: 9 U/L (ref 6–29)
AST: 11 U/L (ref 10–35)
Albumin: 3.7 g/dL (ref 3.6–5.1)
Alkaline phosphatase (APISO): 64 U/L (ref 37–153)
BUN/Creatinine Ratio: 28 (calc) — ABNORMAL HIGH (ref 6–22)
BUN: 30 mg/dL — ABNORMAL HIGH (ref 7–25)
CO2: 25 mmol/L (ref 20–32)
Calcium: 8.7 mg/dL (ref 8.6–10.4)
Chloride: 107 mmol/L (ref 98–110)
Creat: 1.07 mg/dL — ABNORMAL HIGH (ref 0.60–0.93)
GFR, Est African American: 58 mL/min/{1.73_m2} — ABNORMAL LOW (ref >=60)
GFR, Est Non African American: 50 mL/min/{1.73_m2} — ABNORMAL LOW (ref >=60)
Globulin: 2.9 g/dL (calc) (ref 1.9–3.7)
Glucose, Bld: 140 mg/dL — ABNORMAL HIGH (ref 65–99)
Potassium: 4.1 mmol/L (ref 3.5–5.3)
Sodium: 142 mmol/L (ref 135–146)
Total Bilirubin: 0.3 mg/dL (ref 0.2–1.2)
Total Protein: 6.6 g/dL (ref 6.1–8.1)

## 2020-07-04 LAB — HEMOGLOBIN A1C
Hgb A1c MFr Bld: 6.3 % of total Hgb — ABNORMAL HIGH (ref <5.7)
Mean Plasma Glucose: 134 (calc)
eAG (mmol/L): 7.4 (calc)

## 2020-07-04 LAB — T4, FREE: Free T4: 1.1 ng/dL (ref 0.8–1.8)

## 2020-07-04 LAB — TSH: TSH: 2.58 mIU/L (ref 0.40–4.50)

## 2020-07-04 NOTE — Telephone Encounter (Signed)
Referral to hematologist placed.Let me know if they can't get her in within 7-10 days.

## 2020-07-06 ENCOUNTER — Encounter: Payer: Self-pay | Admitting: Medical

## 2020-07-06 ENCOUNTER — Telehealth: Payer: Self-pay | Admitting: Family

## 2020-07-06 NOTE — Telephone Encounter (Signed)
Called to advise patient of appointments that were scheduled.  She is declining appointments her at this time.  Referring MD's office has been made aware of this as well.  Referral Closed at this time. She has a current Engineer, civil (consulting) at [facility] at the present time.

## 2020-07-07 NOTE — Telephone Encounter (Signed)
Patient states she was unable to wait until November to be seen by hematology and went back to her old doctor at [facility]  And will receive blood and iron in the morning there.

## 2020-07-08 ENCOUNTER — Other Ambulatory Visit (HOSPITAL_COMMUNITY): Payer: Self-pay | Admitting: Cardiology

## 2020-07-16 ENCOUNTER — Encounter: Payer: Self-pay | Admitting: Medical

## 2020-07-16 ENCOUNTER — Ambulatory Visit (INDEPENDENT_AMBULATORY_CARE_PROVIDER_SITE_OTHER): Payer: Medicare Other | Admitting: Medical

## 2020-07-16 ENCOUNTER — Other Ambulatory Visit: Payer: Self-pay

## 2020-07-16 VITALS — BP 130/60 | HR 89 | Resp 16 | Ht 65.0 in | Wt 158.2 lb

## 2020-07-16 DIAGNOSIS — D619 Aplastic anemia, unspecified: Secondary | ICD-10-CM | POA: Diagnosis not present

## 2020-07-16 DIAGNOSIS — I10 Essential (primary) hypertension: Secondary | ICD-10-CM

## 2020-07-16 NOTE — Patient Instructions (Addendum)
History of anemia with hx of presumed unidentified gi bleed. Recent decreasing hb/hct despite transfusion.  Please call back gi MD office and see when scheduled.   Then let me know. Want you to repeat cbc and iron level but first want to know when your are scheduled with GI.  BP controlled on recheck. Continue amlodipine.  Follow up 3 weeks or as needed

## 2020-07-16 NOTE — Progress Notes (Signed)
Subjective:    Patient ID: Alisha [name], female    DOB: July 26, 1941, 79 y.o.   MRN: [id number]  HPI  Pt in for follow up.  Pt has hx of anemia. Pt saw her prior hematologist and got transfused one unit yesterday a week ago. She followed up yesterday and will get another unit today after labs drawn yesterday.  Pt hb and hct yesterday. 8.4/27.8  Hematologist at [facility] is attempting to get pt in with GI MD. Former work up was negative. Could not identify exact source of infection.  Pt will call GI back today to do further work up.  Describes extensive work up in the past.  No black or blood stools.  Pt has htn. On amlodipine.Initial bp mild high. On recheck better.     Review of Systems  Constitutional: Negative for chills and fatigue.  Respiratory: Negative for cough, chest tightness, shortness of breath and wheezing.   Cardiovascular: Negative for chest pain and palpitations.  Gastrointestinal: Negative for abdominal pain, blood in stool, constipation and diarrhea.  Genitourinary: Negative for dysuria.  Musculoskeletal: Negative for back pain.  Skin: Negative for rash.  Neurological: Negative for dizziness, seizures, light-headedness and headaches.  Hematological: Negative for adenopathy. Does not bruise/bleed easily.  Psychiatric/Behavioral: Negative for behavioral problems.    Past Medical History:  Diagnosis Date   Aortic atherosclerosis ([place]) 08/30/2017   Aortic stenosis 08/30/2017   CAD (coronary artery disease), native coronary artery 08/30/2017   Chronic diastolic heart failure ([place]) 08/30/2017   Diabetes mellitus with peripheral vascular disease ([place]) 07/14/2014   Hypertensive heart disease without CHF 08/30/2017   Hypothyroidism (acquired) 08/30/2017   Pulmonary hypertension ([place]) 07/04/2017     Social History   Socioeconomic History   Marital status: Divorced    Spouse name: Not on file   Number of children: Not on file   Years of  education: Not on file   Highest education level: Not on file  Occupational History   Not on file  Tobacco Use   Smoking status: Never Smoker   Smokeless tobacco: Never Used  Substance and Sexual Activity   Alcohol use: No   Drug use: No   Sexual activity: Never  Other Topics Concern   Not on file  Social History Narrative   Not on file   Social Determinants of Health   Financial Resource Strain:    Difficulty of Paying Living Expenses: Not on file  Food Insecurity:    Worried About Running Out of Food in the Last Year: Not on file   Ran Out of Food in the Last Year: Not on file  Transportation Needs:    Lack of Transportation (Medical): Not on file   Lack of Transportation (Non-Medical): Not on file  Physical Activity:    Days of Exercise per Week: Not on file   Minutes of Exercise per Session: Not on file  Stress:    Feeling of Stress : Not on file  Social Connections:    Frequency of Communication with Friends and Family: Not on file   Frequency of Social Gatherings with Friends and Family: Not on file   Attends Religious Services: Not on file   Active Member of Clubs or Organizations: Not on file   Attends Archivist Meetings: Not on file   Marital Status: Not on file  Intimate Partner Violence:    Fear of Current or Ex-Partner: Not on file   Emotionally Abused: Not on file   Physically  Abused: Not on file   Sexually Abused: Not on file    Past Surgical History:  Procedure Laterality Date   ANKLE SURGERY     APPENDECTOMY     DILATION AND CURETTAGE OF UTERUS     RIGHT HEART CATH N/A 01/01/2019   Procedure: RIGHT HEART CATH;  Surgeon: Larey Dresser, MD;  Location: [place] [facility];  Service: Cardiovascular;  Laterality: N/A;   RIGHT/LEFT HEART CATH AND CORONARY ANGIOGRAPHY N/A 09/27/2017   Procedure: RIGHT/LEFT HEART CATH AND CORONARY ANGIOGRAPHY;  Surgeon: Sherren Mocha, MD;  Location: [place] [facility];  Service:  Cardiovascular;  Laterality: N/A;    No family history on file.  Allergies  Allergen Reactions   Other Palpitations    Surgical metal (stents, pins, plates) keeps pt from healing   Adhesive [Tape] Other (See Comments)    Irritates skin with rash    Current Outpatient Medications on File Prior to Visit  Medication Sig Dispense Refill   amLODipine (NORVASC) 5 MG tablet Take 1 tablet (5 mg total) by mouth daily. 90 tablet 3   atorvastatin (LIPITOR) 20 MG tablet Take 20 mg by mouth every morning.     cetirizine (ZYRTEC) 10 MG tablet Take 10 mg by mouth daily as needed for allergies.     fenofibrate 160 MG tablet Take 160 mg by mouth every morning.     ferrous sulfate 325 (65 FE) MG tablet Take 325 mg by mouth daily with breakfast. Morning and night     furosemide (LASIX) 80 MG tablet TAKE 1 TABLET BY MOUTH 2 TIMES DAILY. 180 tablet 2   glimepiride (AMARYL) 4 MG tablet Take 4 mg by mouth daily with breakfast.     levothyroxine (SYNTHROID, LEVOTHROID) 125 MCG tablet Take 125 mcg by mouth daily before breakfast.     loperamide (IMODIUM A-D) 2 MG tablet Take 2 mg by mouth 4 (four) times daily as needed for diarrhea or loose stools.     loratadine (CLARITIN) 10 MG tablet Take 10 mg by mouth daily as needed for allergies.      metolazone (ZAROXOLYN) 2.5 MG tablet Take 1 tablet (2.5 mg total) by mouth once a week. Take potassium 9meq with this 4 tablet 6   Multiple Vitamins-Minerals (PRESERVISION AREDS 2 PO) Take 1 capsule by mouth 2 (two) times daily.     pantoprazole (PROTONIX) 40 MG tablet Take 40 mg by mouth every morning.     potassium chloride (KLOR-CON) 10 MEQ tablet Take 1 tablet (10 mEq total) by mouth daily. Take an extra 72meq (2 tablets) every week with metolazone 38 tablet 3   Riociguat 2.5 MG TABS Take 2.5 mg by mouth 3 (three) times daily. 270 tablet 3   Selexipag 1600 MCG TABS Take 1 tablet (1,600 mcg total) by mouth 2 (two) times daily. Take along with 800 mcg  tabs to equal 2400 mcg Twice daily 60 tablet 11   traMADol (ULTRAM) 50 MG tablet Take 50 mg by mouth 2 (two) times daily as needed.     UPTRAVI 800 MCG TABS Take one 1667mcg tablet and one 866mcg tablet to make up 2411mcg twice daily. 180 tablet 3   No current facility-administered medications on file prior to visit.    BP (!) 155/50    Pulse 89    Resp 16    Ht 5\' 5"  (1.651 m)    Wt 158 lb 3.2 oz (71.8 kg)    SpO2 96%    BMI 26.33 kg/m  Objective:   Physical Exam  General Mental Status- Alert. General Appearance- Not in acute distress.   Skin General: Color- Normal Color. Moisture- Normal Moisture.  Chest and Lung Exam Auscultation: Breath Sounds:-Normal.  Cardiovascular Auscultation:Rythm- Regular. Murmurs & Other Heart Sounds:Auscultation of the heart reveals- No Murmurs.  Abdomen Inspection:-Inspeection Normal. Palpation/Percussion:Note:No mass. Palpation and Percussion of the abdomen reveal- Non Tender, Non Distended + BS, no rebound or guarding.   Neurologic Cranial Nerve exam:- CN III-XII intact(No nystagmus), symmetric smile. Strength:- 5/5 equal and symmetric strength both upper and lower extremities.      Assessment & Plan:  History of anemia with hx of presumed unidentified gi bleed. Recent decreasing hb/hct despite transfusion.  Please call back gi MD office and see when scheduled.   Then let me know. Want you to repeat cbc and iron level but first want to know when your are scheduled with GI.  BP controlled on recheck. Continue amlodipine.  Follow up 3 weeks or as needded

## 2020-07-17 ENCOUNTER — Other Ambulatory Visit: Payer: Medicare Other

## 2020-07-17 ENCOUNTER — Ambulatory Visit: Payer: Medicare Other | Admitting: Family

## 2020-07-24 ENCOUNTER — Encounter: Payer: Self-pay | Admitting: Medical

## 2020-08-10 ENCOUNTER — Encounter: Payer: Self-pay | Admitting: Medical

## 2020-08-10 ENCOUNTER — Other Ambulatory Visit: Payer: Self-pay

## 2020-08-10 ENCOUNTER — Ambulatory Visit (INDEPENDENT_AMBULATORY_CARE_PROVIDER_SITE_OTHER): Payer: Medicare Other | Admitting: Medical

## 2020-08-10 VITALS — BP 130/60 | HR 84 | Temp 98.3°F | Resp 18 | Ht 65.0 in | Wt 148.0 lb

## 2020-08-10 DIAGNOSIS — D649 Anemia, unspecified: Secondary | ICD-10-CM

## 2020-08-10 DIAGNOSIS — K5520 Angiodysplasia of colon without hemorrhage: Secondary | ICD-10-CM

## 2020-08-10 DIAGNOSIS — E785 Hyperlipidemia, unspecified: Secondary | ICD-10-CM

## 2020-08-10 DIAGNOSIS — M25562 Pain in left knee: Secondary | ICD-10-CM

## 2020-08-10 DIAGNOSIS — I5032 Chronic diastolic (congestive) heart failure: Secondary | ICD-10-CM | POA: Diagnosis not present

## 2020-08-10 DIAGNOSIS — I10 Essential (primary) hypertension: Secondary | ICD-10-CM

## 2020-08-10 DIAGNOSIS — M25561 Pain in right knee: Secondary | ICD-10-CM

## 2020-08-10 DIAGNOSIS — E1151 Type 2 diabetes mellitus with diabetic peripheral angiopathy without gangrene: Secondary | ICD-10-CM

## 2020-08-10 NOTE — Progress Notes (Signed)
Subjective:    Patient ID: Alisha [name], female    DOB: [birth year], 79 y.o.   MRN: [id number]  HPI  Pt in for follow up.   Pt has hx of anemia. She has had egd and colonoscopy. Pt had procedure to stop source of bleed. Pt states her hb and hct have been trending upward since gi procedure. Pt states she feels like hb/hct have not dropped. Usually she get winded or tired easily when hb/hct.  Pt states gi MD thinks she should get iron infusion every 3 months.  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 One area clamped.  Procedure Information   Procedure Details: The patient underwent monitored anesthesia care, which  was administered by an anesthesia professional. The patient's blood  pressure, respirations, heart rate, oxygen and level of consciousness were  monitored throughout the procedure. The scope was advanced to the proximal  part of the jejunum. The procedure was performed without an overtube.  Fluoroscopy was not used. Retroflexion was performed in the cardia and  fundus. The patient's estimated blood loss was minimal (<5 mL). The  procedure was not difficult. The patient tolerated the procedure well.  There were no apparent complications. Consent: After discussion of the  potential risks including perforation, missed cancers and bleeding; as  well as the benefits, limitations and alternatives the patient consented  to the procedure.  Hx of htn. Pt is on amlodipine.  Has high cholesterol. She is on lipitor. Pt has diabetes. On glimepiride. Last a1 showed controlled a1c average about 134.       Review of Systems  Constitutional: Negative for chills, fatigue and fever.  HENT: Negative for congestion and drooling.   Respiratory: Negative for choking, shortness of breath and wheezing.   Cardiovascular: Negative for chest pain.  Gastrointestinal: Negative for abdominal pain, blood in stool and nausea.  Musculoskeletal: Negative for back pain.  Skin:  Negative for rash.  Neurological: Negative for dizziness, speech difficulty, weakness and headaches.  Hematological: Negative for adenopathy. Does not bruise/bleed easily.  Psychiatric/Behavioral: Negative for behavioral problems and dysphoric mood. The patient is not nervous/anxious.      Past Medical History:  Diagnosis Date  . Aortic atherosclerosis ([place]) 08/30/2017  . Aortic stenosis 08/30/2017  . CAD (coronary artery disease), native coronary artery 08/30/2017  . Chronic diastolic heart failure ([place]) 08/30/2017  . Diabetes mellitus with peripheral vascular disease ([place]) 07/14/2014  . Hypertensive heart disease without CHF 08/30/2017  . Hypothyroidism (acquired) 08/30/2017  . Pulmonary hypertension ([place]) 07/04/2017     Social History   Socioeconomic History  . Marital status: Divorced    Spouse name: Not on file  . Number of children: Not on file  . Years of education: Not on file  . Highest education level: Not on file  Occupational History  . Not on file  Tobacco Use  . Smoking status: Never Smoker  . Smokeless tobacco: Never Used  Substance and Sexual Activity  . Alcohol use: No  . Drug use: No  . Sexual activity: Never  Other Topics Concern  . Not on file  Social History Narrative  . Not on file   Social Determinants of Health   Financial Resource Strain:   . Difficulty of Paying Living Expenses: Not on file  Food Insecurity:   . Worried About Charity fundraiser in the Last Year: Not on file  . Ran Out of Food in the Last Year: Not on file  Transportation  Needs:   . Lack of Transportation (Medical): Not on file  . Lack of Transportation (Non-Medical): Not on file  Physical Activity:   . Days of Exercise per Week: Not on file  . Minutes of Exercise per Session: Not on file  Stress:   . Feeling of Stress : Not on file  Social Connections:   . Frequency of Communication with Friends and Family: Not on file  . Frequency of Social Gatherings with Friends  and Family: Not on file  . Attends Religious Services: Not on file  . Active Member of Clubs or Organizations: Not on file  . Attends Archivist Meetings: Not on file  . Marital Status: Not on file  Intimate Partner Violence:   . Fear of Current or Ex-Partner: Not on file  . Emotionally Abused: Not on file  . Physically Abused: Not on file  . Sexually Abused: Not on file    Past Surgical History:  Procedure Laterality Date  . ANKLE SURGERY    . APPENDECTOMY    . DILATION AND CURETTAGE OF UTERUS    . RIGHT HEART CATH N/A 01/01/2019   Procedure: RIGHT HEART CATH;  Surgeon: Larey Dresser, MD;  Location: [place] [facility];  Service: Cardiovascular;  Laterality: N/A;  . RIGHT/LEFT HEART CATH AND CORONARY ANGIOGRAPHY N/A 09/27/2017   Procedure: RIGHT/LEFT HEART CATH AND CORONARY ANGIOGRAPHY;  Surgeon: Sherren Mocha, MD;  Location: [facility];  Service: Cardiovascular;  Laterality: N/A;    No family history on file.  Allergies  Allergen Reactions  . Other Palpitations    Surgical metal (stents, pins, plates) keeps pt from healing  . Adhesive [Tape] Other (See Comments)    Irritates skin with rash    Current Outpatient Medications on File Prior to Visit  Medication Sig Dispense Refill  . amLODipine (NORVASC) 5 MG tablet Take 1 tablet (5 mg total) by mouth daily. 90 tablet 3  . atorvastatin (LIPITOR) 20 MG tablet Take 20 mg by mouth every morning.    . cetirizine (ZYRTEC) 10 MG tablet Take 10 mg by mouth daily as needed for allergies.    . fenofibrate 160 MG tablet Take 160 mg by mouth every morning.    . ferrous sulfate 325 (65 FE) MG tablet Take 325 mg by mouth daily with breakfast. Morning and night    . furosemide (LASIX) 80 MG tablet TAKE 1 TABLET BY MOUTH 2 TIMES DAILY. 180 tablet 2  . glimepiride (AMARYL) 4 MG tablet Take 4 mg by mouth daily with breakfast.    . levothyroxine (SYNTHROID, LEVOTHROID) 125 MCG tablet Take 125 mcg by mouth daily before  breakfast.    . loperamide (IMODIUM A-D) 2 MG tablet Take 2 mg by mouth 4 (four) times daily as needed for diarrhea or loose stools.    Marland Kitchen loratadine (CLARITIN) 10 MG tablet Take 10 mg by mouth daily as needed for allergies.     . metolazone (ZAROXOLYN) 2.5 MG tablet Take 1 tablet (2.5 mg total) by mouth once a week. Take potassium 35meq with this 4 tablet 6  . Multiple Vitamins-Minerals (PRESERVISION AREDS 2 PO) Take 1 capsule by mouth 2 (two) times daily.    . pantoprazole (PROTONIX) 40 MG tablet Take 40 mg by mouth every morning.    . potassium chloride (KLOR-CON) 10 MEQ tablet Take 1 tablet (10 mEq total) by mouth daily. Take an extra 53meq (2 tablets) every week with metolazone 38 tablet 3  . Riociguat 2.5  MG TABS Take 2.5 mg by mouth 3 (three) times daily. 270 tablet 3  . Selexipag 1600 MCG TABS Take 1 tablet (1,600 mcg total) by mouth 2 (two) times daily. Take along with 800 mcg tabs to equal 2400 mcg Twice daily 60 tablet 11  . traMADol (ULTRAM) 50 MG tablet Take 50 mg by mouth 2 (two) times daily as needed.    Marland Kitchen UPTRAVI 800 MCG TABS Take one 1682mcg tablet and one 869mcg tablet to make up 2423mcg twice daily. 180 tablet 3   No current facility-administered medications on file prior to visit.    BP (!) 145/49   Pulse 84   Temp 98.3 F (36.8 C) (Oral)   Resp 18   Ht 5\' 5"  (1.651 m)   Wt 148 lb (67.1 kg)   SpO2 98%   BMI 24.63 kg/m       Objective:   Physical Exam  General Mental Status- Alert. General Appearance- Not in acute distress.   Skin General: Color- Normal Color. Moisture- Normal Moisture.  Neck Carotid Arteries- Normal color. Moisture- Normal Moisture. No carotid bruits. No JVD.  Chest and Lung Exam Auscultation: Breath Sounds:-Normal.  Cardiovascular Auscultation:Rythm- Regular. Murmurs & Other Heart Sounds:Auscultation of the heart reveals- No Murmurs.  Abdomen Inspection:-Inspeection Normal. Palpation/Percussion:Note:No mass. Palpation and  Percussion of the abdomen reveal- Non Tender, Non Distended + BS, no rebound or guarding.  Lower ext- faint 1+ pedal edema at best.  Neurologic Cranial Nerve exam:- CN III-XII intact(No nystagmus), symmetric smile. Strength:- 5/5 equal and symmetric strength both upper and lower extremities.     Assessment & Plan:  Your bp is well controlled today. Continue amlodipine.  For high cholesterol continue lipitor.  Diabetes well controlled. Continue glimepride.  For anemia follow hb/hct trend. Update me when hematologist repeats soon.   For gastrointestinal bleed follow up with gastroenterologist.  For chf continue diuretics. Echo showed good EF. If gaining wait or worse pedal edema/dypsnea let us know.  Follow up in 3 months or as needed  [employer], [employer]

## 2020-08-10 NOTE — Patient Instructions (Signed)
Your bp is well controlled today. Continue amlodipine.  For high cholesterol continue lipitor.  Diabetes well controlled. Continue glimepride.  For anemia follow hb/hct trend. Update me when hematologist repeats soon.   For gastrointestinal bleed follow up with gastroenterologist.  For chf continue diuretics. Echo showed good EF. If gaining wait or worse pedal edema/dypsnea let us know.  Follow up in 3 months or as needed

## 2020-08-12 ENCOUNTER — Encounter: Payer: Self-pay | Admitting: Medical

## 2020-08-25 ENCOUNTER — Telehealth (HOSPITAL_COMMUNITY): Payer: Self-pay | Admitting: Pharmacist

## 2020-08-25 NOTE — Telephone Encounter (Signed)
Patient Advocate Encounter   Received notification from [employer] that prior authorization for Alisha [name] is required.   PA submitted on CoverMyMeds Key [id number] Status is pending   Will continue to follow.   Audry Riles, PharmD, BCPS, BCCP, CPP Heart Failure Clinic Pharmacist [phone]

## 2020-08-25 NOTE — Telephone Encounter (Signed)
Patient Advocate Encounter   Received notification from [employer] that prior authorization for Adempas is required.   PA submitted on CoverMyMeds Key [id number] Status is pending   Will continue to follow.   Audry Riles, PharmD, BCPS, BCCP, CPP Heart Failure Clinic Pharmacist [phone]

## 2020-08-25 NOTE — Telephone Encounter (Signed)
Advanced Heart Failure Patient Advocate Encounter  Prior Authorization for Adempas has been approved.    PA# [id number] Effective dates: 09/12/20 through 09/11/21   Audry Riles, PharmD, BCPS, BCCP, CPP Heart Failure Clinic Pharmacist [phone]

## 2020-08-26 NOTE — Telephone Encounter (Signed)
Advanced Heart Failure Patient Advocate Encounter  Prior Authorization for Uptravi 1600 mcg has been approved.    PA# [id number] Effective dates: 09/12/20 through 09/11/21   Of note, prior authorization for Uptravi 800 mcg is still pending.   Audry Riles, PharmD, BCPS, BCCP, CPP Heart Failure Clinic Pharmacist [phone]

## 2020-08-28 NOTE — Telephone Encounter (Signed)
Patient Advocate Encounter   Received notification from [employer] that prior authorization for Uptravi 800 mcg is required.   PA submitted via fax Status is pending   Will continue to follow.   Audry Riles, PharmD, BCPS, BCCP, CPP Heart Failure Clinic Pharmacist [phone]

## 2020-08-31 ENCOUNTER — Encounter (HOSPITAL_COMMUNITY): Payer: Self-pay

## 2020-09-15 NOTE — Telephone Encounter (Signed)
PA for Uptravi 800 mcg was cancelled. Called [employer] and the representative stated that a separate PA for the 800 mcg strength is not required.   Audry Riles, PharmD, BCPS, BCCP, CPP Heart Failure Clinic Pharmacist [phone]

## 2020-09-17 ENCOUNTER — Telehealth (HOSPITAL_COMMUNITY): Payer: Self-pay | Admitting: *Deleted

## 2020-09-17 NOTE — Telephone Encounter (Signed)
Pt called concerned about her heart fluctuating, she states it was bothering her around Christmas then was better until the last 4 or 5 days. She states her HR increases in the afternoons and runs 66 up to 130s, last night it stayed over 120 for about 2 hours before breaking. She states her apple watch alerts her to this and she feels her heart beating hard. She also reports increased LE edema that starts in the afternoon and is better when she wakes up. Appt sch for tomorrow

## 2020-09-18 ENCOUNTER — Telehealth (HOSPITAL_COMMUNITY): Payer: Self-pay | Admitting: Family Medicine

## 2020-09-18 ENCOUNTER — Ambulatory Visit (HOSPITAL_COMMUNITY)
Admission: RE | Admit: 2020-09-18 | Discharge: 2020-09-18 | Disposition: A | Discharge status: Home / Self Care | Payer: Medicare Other | Source: Ambulatory Visit | Attending: Cardiology | Admitting: Cardiology

## 2020-09-18 ENCOUNTER — Ambulatory Visit (HOSPITAL_COMMUNITY)
Admission: RE | Admit: 2020-09-18 | Discharge: 2020-09-18 | Disposition: A | Discharge status: Home / Self Care | Payer: Medicare Other | Source: Ambulatory Visit | Attending: Family Medicine | Admitting: Family Medicine

## 2020-09-18 ENCOUNTER — Other Ambulatory Visit: Payer: Self-pay

## 2020-09-18 ENCOUNTER — Encounter (HOSPITAL_COMMUNITY): Payer: Self-pay

## 2020-09-18 ENCOUNTER — Other Ambulatory Visit (HOSPITAL_COMMUNITY): Payer: Self-pay | Admitting: Family Medicine

## 2020-09-18 VITALS — BP 144/46 | HR 93 | Wt 156.4 lb

## 2020-09-18 DIAGNOSIS — I440 Atrioventricular block, first degree: Secondary | ICD-10-CM | POA: Insufficient documentation

## 2020-09-18 DIAGNOSIS — Z79899 Other long term (current) drug therapy: Secondary | ICD-10-CM | POA: Diagnosis not present

## 2020-09-18 DIAGNOSIS — Z888 Allergy status to other drugs, medicaments and biological substances status: Secondary | ICD-10-CM | POA: Insufficient documentation

## 2020-09-18 DIAGNOSIS — N183 Chronic kidney disease, stage 3 unspecified: Secondary | ICD-10-CM | POA: Insufficient documentation

## 2020-09-18 DIAGNOSIS — I491 Atrial premature depolarization: Secondary | ICD-10-CM | POA: Insufficient documentation

## 2020-09-18 DIAGNOSIS — R002 Palpitations: Secondary | ICD-10-CM | POA: Insufficient documentation

## 2020-09-18 DIAGNOSIS — R55 Syncope and collapse: Secondary | ICD-10-CM | POA: Insufficient documentation

## 2020-09-18 DIAGNOSIS — I130 Hypertensive heart and chronic kidney disease with heart failure and stage 1 through stage 4 chronic kidney disease, or unspecified chronic kidney disease: Secondary | ICD-10-CM | POA: Diagnosis not present

## 2020-09-18 DIAGNOSIS — Z7984 Long term (current) use of oral hypoglycemic drugs: Secondary | ICD-10-CM | POA: Insufficient documentation

## 2020-09-18 DIAGNOSIS — E1122 Type 2 diabetes mellitus with diabetic chronic kidney disease: Secondary | ICD-10-CM | POA: Diagnosis not present

## 2020-09-18 DIAGNOSIS — Z8616 Personal history of COVID-19: Secondary | ICD-10-CM | POA: Diagnosis not present

## 2020-09-18 DIAGNOSIS — Z86718 Personal history of other venous thrombosis and embolism: Secondary | ICD-10-CM | POA: Insufficient documentation

## 2020-09-18 DIAGNOSIS — G4733 Obstructive sleep apnea (adult) (pediatric): Secondary | ICD-10-CM

## 2020-09-18 DIAGNOSIS — I272 Pulmonary hypertension, unspecified: Secondary | ICD-10-CM | POA: Diagnosis present

## 2020-09-18 DIAGNOSIS — I080 Rheumatic disorders of both mitral and aortic valves: Secondary | ICD-10-CM | POA: Insufficient documentation

## 2020-09-18 DIAGNOSIS — Z8719 Personal history of other diseases of the digestive system: Secondary | ICD-10-CM

## 2020-09-18 DIAGNOSIS — I350 Nonrheumatic aortic (valve) stenosis: Secondary | ICD-10-CM

## 2020-09-18 DIAGNOSIS — I5032 Chronic diastolic (congestive) heart failure: Secondary | ICD-10-CM | POA: Insufficient documentation

## 2020-09-18 DIAGNOSIS — I050 Rheumatic mitral stenosis: Secondary | ICD-10-CM

## 2020-09-18 DIAGNOSIS — N1832 Chronic kidney disease, stage 3b: Secondary | ICD-10-CM

## 2020-09-18 LAB — CBC
HCT: 37.5 % (ref 36.0–46.0)
Hemoglobin: 12.4 g/dL (ref 12.0–15.0)
MCH: 27.8 pg (ref 26.0–34.0)
MCHC: 33.1 g/dL (ref 30.0–36.0)
MCV: 84.1 fL (ref 80.0–100.0)
Platelets: 332 10*3/uL (ref 150–400)
RBC: 4.46 MIL/uL (ref 3.87–5.11)
RDW: 17.4 % — ABNORMAL HIGH (ref 11.5–15.5)
WBC: 7.7 10*3/uL (ref 4.0–10.5)
nRBC: 0 % (ref 0.0–0.2)

## 2020-09-18 LAB — BASIC METABOLIC PANEL
Anion gap: 12 (ref 5–15)
BUN: 34 mg/dL — ABNORMAL HIGH (ref 8–23)
CO2: 22 mmol/L (ref 22–32)
Calcium: 8.5 mg/dL — ABNORMAL LOW (ref 8.9–10.3)
Chloride: 103 mmol/L (ref 98–111)
Creatinine, Ser: 1.36 mg/dL — ABNORMAL HIGH (ref 0.44–1.00)
GFR, Estimated: 40 mL/min — ABNORMAL LOW (ref >60)
Glucose, Bld: 141 mg/dL — ABNORMAL HIGH (ref 70–99)
Potassium: 3 mmol/L — ABNORMAL LOW (ref 3.5–5.1)
Sodium: 137 mmol/L (ref 135–145)

## 2020-09-18 LAB — BRAIN NATRIURETIC PEPTIDE: B Natriuretic Peptide: 347.7 pg/mL — ABNORMAL HIGH (ref 0.0–100.0)

## 2020-09-18 LAB — TSH: TSH: 0.568 u[IU]/mL (ref 0.350–4.500)

## 2020-09-18 LAB — MAGNESIUM: Magnesium: 2.3 mg/dL (ref 1.7–2.4)

## 2020-09-18 MED ORDER — FUROSEMIDE 20 MG PO TABS: 60.0000 mg | ORAL_TABLET | Freq: Two times a day (BID) | ORAL | 3 refills | Status: DC

## 2020-09-18 MED ORDER — POTASSIUM CHLORIDE ER 10 MEQ PO TBCR: 20.0000 meq | EXTENDED_RELEASE_TABLET | Freq: Every day | ORAL | 3 refills | Status: DC

## 2020-09-18 MED ORDER — METOPROLOL SUCCINATE ER 25 MG PO TB24: 25.0000 mg | ORAL_TABLET | Freq: Every day | ORAL | 11 refills | Status: DC

## 2020-09-18 MED ORDER — POTASSIUM CHLORIDE ER 10 MEQ PO TBCR: 10.0000 meq | EXTENDED_RELEASE_TABLET | Freq: Every day | ORAL | 3 refills | Status: DC

## 2020-09-18 NOTE — Addendum Note (Signed)
Addended by: Rafael Bihari on: 09/18/2020 05:02 PM   Modules accepted: Orders

## 2020-09-18 NOTE — Progress Notes (Signed)
ReDS Vest / Clip - 09/18/20 1400      ReDS Vest / Clip   Station Marker A    Ruler Value 32    ReDS Value Range Low volume    ReDS Actual Value 35    Anatomical Comments sitting

## 2020-09-18 NOTE — Progress Notes (Signed)
Date:  09/18/2020   ID:  Alisha [name], DOB 27-Sep-1940, MRN [id number]   Provider location: [facility] Type of Visit: Established patient   PCP:  Mackie Pai, PA-C  Cardiologist:  Dr. Aundra Dubin   History of Present Illness: Alisha [name] is a 80 y.o. female with history of diabetes, HTN, PAD, hyperlipidemia who was referred by Dr. Wynonia Lawman for evaluation of pulmonary hypertension.   For > 1 year, she has had significant exertional dyspnea. It has been steadily worsening, especially over the last few months.  She has been extensively worked up so far.  Echo in 4/18 showed preserved EF 65% with moderate pulmonary hypertension.  She had episodes of syncope in 5/18 and 10/18.  She wore an event monitor in 5/18 with no significant arrhythmia.  LHC/RHC in 1/19 showed normal PCWP and severely elevated PA pressure, no significant CAD.    At initial appointment in 1/19, she was noted to be hypoxemic with exertion and home oxygen was started for use with exertion.  I also started her on Opsumit. She had to stop this after about a week due to significantly increased exertional peripheral edema.  This resolved after Opsumit was stopped.   Echo in 4/19.  This showed EF 60-65%, mild MS, mild AS, moderately dilated RV with mildly decreased systolic function.   She was found to have Fe deficiency anemia, FOBT+.  EGD showed gastritis and c-scope showed diverticulosis and polyps in 4/19.  She get IV Fe.  She is going to have a capsule endoscopy.   She tried to start ambrisentan, but developed worsening dyspnea and peripheral edema and low oxygen saturation.  This improved after ambrisentan was stopped.   She is now taking riociguat and Uptravi.  RHC in 4/20 showed normal filling pressures but severe pulmonary hypertension with PVR 6 WU.  Echo showed moderately dilated RV with normal systolic function, mild-moderate MS, mild AS.  [name] has now been titrated up to 2400 mcg bid  and riociguat to 1.5 mg tid.    She had COVID-19 PNA in 2/21, hospitalized for about a week.   Echo in 4/21 showed EF 63-87%, grade 2 diastolic dysfunction, normal RV, PASP 50 mmHg, probably mild mitral stenosis with mean gradient 7 mmHg and MVA 2 cm^2 by PHT.   Follow up OV on 9/21 she was feeling back to normal after COVID-19 infection over the winter.  Metolazone 2.5 mg was started weekly.  Today she returns with complaints of palpitations and abnormal HR. She is alerted on her Apple Watch and has been on-going since Christmas. Sensations last few seconds then go back again. Episodes can last 10 minutes or less. Highest HR 170's then back down to 50-80's. This has been ongoing since mid-December.  Denies increasing SOB, CP, dizziness, or PND/Orthopnea. Appetite ok. No fever or chills. Weight at home ~ 151-152 pounds. Not taking potassium consistently.  Has been only taking lasix 40 mg bid since OV in November, instead of 80 mg bid. Says she notices more leg swelling but it resolves once she takes her weekly metolazone.  ECG (personally reviewed): ST with 1st degree AV block 133 bpm, QTc 559 (personally reviewed). Dr. Caryl Comes personally reviewed--> two distinct P waves, but P wave morphologies are not consistent. so doubt it is a persistent tachycardia as opposed to frequent ectopy.   6 minute walk (1/19): 92 m, oxygen saturation dropped to 70%.   6 minute walk (3/19): 122 m 6 minute  walk (4/19): 392 m 6 minute walk (9/19): 317 m 6 minute walk (1/20): 320 m 6 minute walk (8/20): 359 m 6 minute walk (10/20): 391 m 6 minute walk (9/21): 335 m, oxygen saturation 90-96%  Labs (1/19): anti-Jo1 negative, RF borderline positive, SCL-70 negative, HIV negative. ESR 21. BNP 1129.  Labs (3/19): ANA + => anti-dsDNA negative, SSA/SSB negative, anti-Sm negative, anti-RNP negative. K 3.8 => 3.7, creatinine 1.4 => 1.55.  Labs (4/19): K 4.1 => 3.1, creatinine 1.58 => 1.2, BNP 1902, CCP Ab negative.  Labs  (6/19): hgb 9, creatinine 1.66, K 3 Labs (7/19): K 4.9, creatinine 1.7 Labs (8/19): K 3.6, creatinine 1.36 Labs (9/19): K 4.1, creatinine 1.55, BNP 641 Labs (10/19): K 4.7, creatinine 1.88, BNP 595 Labs (1/20): Hgb 12.2 Labs (4/20): K 4.2, creatinine 2.2, hgb 8.4 Labs (7/20): K 3.2, creatinine 2.07, hgb 8 Labs (8/20): K 3.7, creatinine 1.46 Labs (9/20): K 4, creatinine 1.5 Labs (10/20): K 4.5, creatinine 1.75 Labs (2/21): K 4.1, creatinine 1.78 Labs (7/21): K 3.7, creatinine 1.22, hgb 12.2 Labs (9/21): K 3.7, creatinine 1.53, hgb 12 Labs (10/21): K 4.1, creatinine 1.07 hgb 8.9  PMH: 1. PAD: Non-healing left ankle ulcer.  - ABIs 9/12 were normal.  2. Type 2 diabetes 3. Hypothyroidism 4. CKD stage 3 5. Hyperlipidemia 6. HTN 7. IBS 8. OSA: on CPAP.  9. Event monitor 5/18: No significant abnormality.  10. DVT in 1995 11. Pulmonary hypertension: Echo (4/18) with EF 65%, mild MR, mild TR, mild mitral stenosis, moderate pulmonary hypertension. - CTA chest 10/17: No PE.  - RHC (1/19): mean RA 11, PA 87/31 mean 51, mean PCWP 6, CI 3.58, PVR 7 WU.  - PFTs (1/19): mild obstruction, some restriction => severely decreased DLCO. - High resolution CT chest (1/19): no interstitial lung disease or emphysema. 7 mm nodule RUL.  - anti-Jo1 negative, RF borderline positive, SCL-70 negative, HIV negative. ANA + => anti-dsDNA negative, SSA/SSB negative, anti-Sm negative, anti-RNP negative. - V/Q scan 2/19: No e/o chronic PE.  - Echo (4/19): EF 60-65%, mild LVH, mild aortic stenosis, mild mitral stenosis, moderately dilated RV with mildly decreased systolic function, PASP 36 mmHg (poor envelope, may be underestimated).  - Does not tolerate ERAs (macitentan, ambrisentan).  - RHC (4/20): mean RA 5, PA 85/22 mean 51, mean PCWP 12, CI  3.5, PVR 6 WU.  - Echo (4/20): EF > 65%, moderate LVH, RV moderately dilated with normal systolic function, moderate pericardial effusion, mild-moderate mitral stenosis  with mean gradient 7/MVA by PHT 2.48 cm^2, mild AS mean gradient 12.  - Echo (4/21): EF 35-36%, grade 2 diastolic dysfunction, normal RV, PASP 50 mmHg, probably mild mitral stenosis with mean gradient 7 mmHg and MVA 2 cm^2 by PHT, mild AS.  12. Coronary angiography 11/18 without significant disease.  - Coronary angiography in 1/19 with luminal irregularities 13. Mitral stenosis: Mild by cath in 1/19, mean gradient 2.6 mmHg. Mild on 4/19 echo.  - Mild-moderate by echo in 4/20.  - Mild by echo in 4/21.  14. Aortic stenosis: Mild on 4/19 echo.  - Mild on 4/20 echo.  - Mild on 4/21 echo.  15. Fe deficiency anemia: EGD showed gastritis and c-scope showed diverticulosis and polyps in 4/19. Capsule endoscopy was unrevealing.  16. Pulmonary nodule: CT chest 7/19 with RUL nodule.  17. Low back pain/sciatica 18. COVID-19 PNA in 2/21.   Current Outpatient Medications  Medication Sig Dispense Refill  . amLODipine (NORVASC) 5 MG tablet Take 1 tablet (5  mg total) by mouth daily. 90 tablet 3  . atorvastatin (LIPITOR) 20 MG tablet Take 20 mg by mouth every morning.    . cetirizine (ZYRTEC) 10 MG tablet Take 10 mg by mouth daily as needed for allergies.    . fenofibrate 160 MG tablet Take 160 mg by mouth every morning.    . ferrous sulfate 325 (65 FE) MG tablet Take 325 mg by mouth daily with breakfast. Morning and night    . furosemide (LASIX) 80 MG tablet TAKE 1 TABLET BY MOUTH 2 TIMES DAILY. 180 tablet 2  . glimepiride (AMARYL) 4 MG tablet Take 4 mg by mouth daily with breakfast.    . levothyroxine (SYNTHROID, LEVOTHROID) 125 MCG tablet Take 125 mcg by mouth daily before breakfast.    . loperamide (IMODIUM A-D) 2 MG tablet Take 2 mg by mouth 4 (four) times daily as needed for diarrhea or loose stools.    Marland Kitchen loratadine (CLARITIN) 10 MG tablet Take 10 mg by mouth daily as needed for allergies.     . metolazone (ZAROXOLYN) 2.5 MG tablet Take 1 tablet (2.5 mg total) by mouth once a week. Take potassium 41mq  with this 4 tablet 6  . Multiple Vitamins-Minerals (PRESERVISION AREDS 2 PO) Take 1 capsule by mouth 2 (two) times daily.    . pantoprazole (PROTONIX) 40 MG tablet Take 40 mg by mouth every morning.    . potassium chloride (KLOR-CON) 10 MEQ tablet Take 1 tablet (10 mEq total) by mouth daily. Take an extra 23m (2 tablets) every week with metolazone 38 tablet 3  . Riociguat 2.5 MG TABS Take 2.5 mg by mouth 3 (three) times daily. 270 tablet 3  . Selexipag 1600 MCG TABS Take 1 tablet (1,600 mcg total) by mouth 2 (two) times daily. Take along with 800 mcg tabs to equal 2400 mcg Twice daily 60 tablet 11  . traMADol (ULTRAM) 50 MG tablet Take 50 mg by mouth 2 (two) times daily as needed.    . Marland KitchenPTRAVI 800 MCG TABS Take one 160056mtablet and one 800m64mablet to make up 2400mc76mice daily. 180 tablet 3   No current facility-administered medications for this encounter.    Allergies:   Other and Adhesive [tape]   Social History:  The patient  reports that she has never smoked. She has never used smokeless tobacco. She reports that she does not drink alcohol and does not use drugs.   Family History:  The patient's family history is not on file.   ROS:  Please see the history of present illness.   All other systems are personally reviewed and negative.   Exam:   Reds: 35% BP (!) 144/46   Pulse 93 Comment: irregular  Wt 70.9 kg   SpO2 97%   BMI 26.03 kg/m  General:  NAD. No resp difficulty HEENT: Normal Neck: Supple. JVP~6-7. Carotids 2+ bilat; no bruits. No lymphadenopathy or thryomegaly appreciated. Cor: PMI nondisplaced. Regular rate & rhythm. No rubs, gallops. 3/6 SEM RUSB with clear S2.   Lungs: Clear Abdomen: Soft, nontender, nondistended. No hepatosplenomegaly. No bruits or masses. Good bowel sounds. Extremities: No cyanosis, clubbing, rash, 2+ lower extremity edema Neuro: Alert & oriented x 3, cranial nerves grossly intact. Moves all 4 extremities w/o difficulty. Affect  pleasant.  Recent Labs: 07/03/2020: ALT 9; BUN 30; Creat 1.07; Hemoglobin 8.9; Platelets 402; Potassium 4.1; Sodium 142; TSH 2.58  Personally reviewed   Wt Readings from Last 3 Encounters:  09/18/20 70.9 kg  08/10/20  67.1 kg  07/16/20 71.8 kg    ASSESSMENT AND PLAN:  1. Pulmonary hypertension with RV failure:  Severe PAH with PVR 7 WU on 1/19 RHC.  Etiology uncertain.  She has a history of DVT remotely but V/Q scan in 2/19 did not show evidence for chronic PE. She has OSA and uses CPAP.  Serologic workup showed only positive ANA but when additional serologies were done, they were all negative (see above).  CT chest with no ILD or emphysema.  Valvular heart disease does not appear significant enough to cause this degree of PH.  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  Echo in 4/19 showed that the RV is moderately dilated with mildly decreased systolic function, PA systolic pressure estimation was only 36 mmHg but poor envelope and likely underestimated. She did not tolerate Opsumit or ambrisentan due to significant lower extremity edema and worsening dyspnea.  [place] in 4/20 showed severe pulmonary hypertension with normal filling pressures and preserved cardiac output.  Echo in 4/21 showed normal RV with PASP 50 mmHg. Symptomatically improved.    She does have some volume on exam. Due for her metolazone dose tomorrow. Weight up 9 lbs since last visit. - Increase lasix to 60 mg bid (she had previously been prescribed 80 bid, but said she never took 80 bid). - Continue metolazone 2.5 qwk with an extra KCl 20 on metolazone days.   - Will await BMET results to supplement KCl (has not been consistent with taking her potassium due to difficulty swallowing, will send in 10 mEq coated tablets). - Continue Adempas, she is at goal.  [name] is up to 2400 mcg BID.   - BMET today, repeat at appointment next week.  2. Palpitations: Event monitor 5/18 showed no significant abnormality.  ST today on EKG, Apple Watch shows HR  50-170s. Place Zio for 14 days, check BMET, TSH, CBC, & Mag today. - Start Toprol XL 25 mg daily (personally discussed with Dr. Aundra Dubin).  3. Syncope: Suspect related to severe pulmonary hypertension.  None recently.    4. Mitral stenosis: Noted on echo and cath, probably mild on 4/21 echo.   5. H/o DVT: No chronic PE on V/Q scan.    6. HTN: Slightly elevated today,she is now off losartan.   7. OSA: No longer on CPAP, cannot tolerate. Has not worn for over 1 year.   8 CKD: Stage 3.  BMET today.    9. Aortic stenosis: Mild on 4/21 echo.    10. H/o GI bleeding: Slow chronic GI bleed, source has not been found despite extensive workup.  - CBC today.   Followup with Dr. Aundra Dubin as scheduled next week.  Signed, Rafael Bihari, FNP  09/18/2020   [facility] [address] [facility] [place] [address] [phone] (office) [phone] (fax)

## 2020-09-18 NOTE — Telephone Encounter (Signed)
mychart message sent, rx sent in

## 2020-09-18 NOTE — Telephone Encounter (Signed)
Spoke with patient regarding labs and need for potassium supplementation. Patient requests I send her a MyChart message with instructions.

## 2020-09-18 NOTE — Patient Instructions (Signed)
START Metoprolol XL 25mg , one tab daily RESTART Potassium 10 meq, one tab daily  INCREASE Lasix to 60 mg twice a day   Labs today We will only contact you if something comes back abnormal or we need to make some changes. Otherwise no news is good news!  Your provider has recommended that  you wear a Zio Patch for 14 days.  This monitor will record your heart rhythm for our review.  IF you have any symptoms while wearing the monitor please press the button.  If you have any issues with the patch or you notice a red or orange light on it please call the company at [phone].  Once you remove the patch please mail it back to the company as soon as possible so we can get the results.   Your physician recommends that you schedule a follow-up appointment in: 4-6 weeks  in the [facility]    If you have any questions or concerns before your next appointment please send [place] a message through [place] or call our office at [phone].    TO LEAVE A MESSAGE FOR THE NURSE SELECT OPTION 2, PLEASE LEAVE A MESSAGE INCLUDING: . YOUR NAME . DATE OF BIRTH . CALL BACK NUMBER . REASON FOR CALL**this is important as we prioritize the call backs  YOU WILL RECEIVE A CALL BACK THE SAME DAY AS LONG AS YOU CALL BEFORE 4:00 PM

## 2020-09-23 ENCOUNTER — Other Ambulatory Visit: Payer: Self-pay

## 2020-09-23 ENCOUNTER — Encounter (HOSPITAL_COMMUNITY): Payer: Self-pay | Admitting: Cardiology

## 2020-09-23 ENCOUNTER — Encounter (HOSPITAL_COMMUNITY): Payer: Self-pay

## 2020-09-23 ENCOUNTER — Ambulatory Visit (HOSPITAL_COMMUNITY)
Admission: RE | Admit: 2020-09-23 | Discharge: 2020-09-23 | Disposition: A | Discharge status: Home / Self Care | Payer: Medicare Other | Source: Ambulatory Visit | Attending: Cardiology | Admitting: Cardiology

## 2020-09-23 VITALS — BP 118/50 | HR 81 | Wt 152.4 lb

## 2020-09-23 DIAGNOSIS — E785 Hyperlipidemia, unspecified: Secondary | ICD-10-CM | POA: Insufficient documentation

## 2020-09-23 DIAGNOSIS — G4733 Obstructive sleep apnea (adult) (pediatric): Secondary | ICD-10-CM | POA: Diagnosis not present

## 2020-09-23 DIAGNOSIS — I350 Nonrheumatic aortic (valve) stenosis: Secondary | ICD-10-CM | POA: Insufficient documentation

## 2020-09-23 DIAGNOSIS — Z888 Allergy status to other drugs, medicaments and biological substances status: Secondary | ICD-10-CM | POA: Insufficient documentation

## 2020-09-23 DIAGNOSIS — Z7984 Long term (current) use of oral hypoglycemic drugs: Secondary | ICD-10-CM | POA: Insufficient documentation

## 2020-09-23 DIAGNOSIS — Z79890 Hormone replacement therapy (postmenopausal): Secondary | ICD-10-CM | POA: Insufficient documentation

## 2020-09-23 DIAGNOSIS — I5032 Chronic diastolic (congestive) heart failure: Secondary | ICD-10-CM | POA: Insufficient documentation

## 2020-09-23 DIAGNOSIS — Z8719 Personal history of other diseases of the digestive system: Secondary | ICD-10-CM | POA: Insufficient documentation

## 2020-09-23 DIAGNOSIS — E1122 Type 2 diabetes mellitus with diabetic chronic kidney disease: Secondary | ICD-10-CM | POA: Insufficient documentation

## 2020-09-23 DIAGNOSIS — N183 Chronic kidney disease, stage 3 unspecified: Secondary | ICD-10-CM | POA: Diagnosis not present

## 2020-09-23 DIAGNOSIS — I130 Hypertensive heart and chronic kidney disease with heart failure and stage 1 through stage 4 chronic kidney disease, or unspecified chronic kidney disease: Secondary | ICD-10-CM | POA: Insufficient documentation

## 2020-09-23 DIAGNOSIS — Z86718 Personal history of other venous thrombosis and embolism: Secondary | ICD-10-CM | POA: Insufficient documentation

## 2020-09-23 DIAGNOSIS — Z8616 Personal history of COVID-19: Secondary | ICD-10-CM | POA: Diagnosis not present

## 2020-09-23 DIAGNOSIS — I272 Pulmonary hypertension, unspecified: Secondary | ICD-10-CM | POA: Diagnosis present

## 2020-09-23 DIAGNOSIS — R55 Syncope and collapse: Secondary | ICD-10-CM | POA: Insufficient documentation

## 2020-09-23 DIAGNOSIS — Z79899 Other long term (current) drug therapy: Secondary | ICD-10-CM | POA: Insufficient documentation

## 2020-09-23 HISTORY — DX: Heart failure, unspecified: I50.9

## 2020-09-23 LAB — BASIC METABOLIC PANEL
Anion gap: 14 (ref 5–15)
BUN: 43 mg/dL — ABNORMAL HIGH (ref 8–23)
CO2: 24 mmol/L (ref 22–32)
Calcium: 8.8 mg/dL — ABNORMAL LOW (ref 8.9–10.3)
Chloride: 100 mmol/L (ref 98–111)
Creatinine, Ser: 1.55 mg/dL — ABNORMAL HIGH (ref 0.44–1.00)
GFR, Estimated: 34 mL/min — ABNORMAL LOW (ref >60)
Glucose, Bld: 187 mg/dL — ABNORMAL HIGH (ref 70–99)
Potassium: 3.6 mmol/L (ref 3.5–5.1)
Sodium: 138 mmol/L (ref 135–145)

## 2020-09-23 LAB — BRAIN NATRIURETIC PEPTIDE: B Natriuretic Peptide: 342.3 pg/mL — ABNORMAL HIGH (ref 0.0–100.0)

## 2020-09-23 NOTE — Progress Notes (Signed)
Patient is being seen in the [facility]. VS, EKG, and device interrogations performed in the clinic. Dr McLean is at home and seeing patients via telemedicine as he is in quarantine.   

## 2020-09-23 NOTE — Progress Notes (Signed)
Heart Failure TeleHealth Note  Due to national recommendations of social distancing due to [place] 19, Audio/video telehealth visit is felt to be most appropriate for this patient at this time.  See MyChart message from today for patient consent regarding telehealth for [facility].  Date:  09/23/2020   ID:  Alisha [name], DOB [birth year], MRN [id number]  Location: Patient in office, physician at home due to quarantine.  Provider location: [place] [facility] Type of Visit: Established patient   PCP:  Mackie Pai, PA-C  Cardiologist:  Dr. Aundra Dubin   History of Present Illness: Alisha [name] is a 80 y.o. female who presents via audio/video conferencing for a telehealth visit today.     she denies symptoms worrisome for COVID 19.   Patient has a history of diabetes, HTN, PAD, hyperlipidemia. She was referred by Dr. Wynonia Lawman for evaluation of pulmonary hypertension.   For > 1 year, she has had significant exertional dyspnea. It has been steadily worsening, especially over the last few months.  She has been extensively worked up so far.  Echo in 4/18 showed preserved EF 65% with moderate pulmonary hypertension.  She had episodes of syncope in 5/18 and 10/18.  She wore an event monitor in 5/18 with no significant arrhythmia.  LHC/RHC in 1/19 showed normal PCWP and severely elevated PA pressure, no significant CAD.    At initial appointment in 1/19, she was noted to be hypoxemic with exertion and home oxygen was started for use with exertion.  I also started her on Opsumit. She had to stop this after about a week due to significantly increased exertional peripheral edema.  This resolved after Opsumit was stopped.   Echo in 4/19.  This showed EF 60-65%, mild MS, mild AS, moderately dilated RV with mildly decreased systolic function.   She was found to have Fe deficiency anemia, FOBT+.  EGD showed gastritis and c-scope showed diverticulosis and polyps in 4/19.  She get IV  Fe.  She is going to have a capsule endoscopy.   She tried to start ambrisentan, but developed worsening dyspnea and peripheral edema and low oxygen saturation.  This improved after ambrisentan was stopped.   She is now taking riociguat and Uptravi.  RHC in 4/20 showed normal filling pressures but severe pulmonary hypertension with PVR 6 WU.  Echo showed moderately dilated RV with normal systolic function, mild-moderate MS, mild AS.  [name] has now been titrated up to 2400 mcg bid and riociguat to 1.5 mg tid.    She had COVID-19 PNA in 2/21, hospitalized for about a week.   Echo in 4/21 showed EF 29-79%, grade 2 diastolic dysfunction, normal RV, PASP 50 mmHg, probably mild mitral stenosis with mean gradient 7 mmHg and MVA 2 cm^2 by PHT.   At last appointment, she reported frequent palpitations and sensation of her heart racing.  She is wearing a Zio patch x 14 days.  Toprol XL was started.  Lasix was also increased to 80 mg bid due to volume overload.  She returns for followup of diastolic CHF and pulmonary hypertension.  She is doing better.  Palpitations have resolved on metoprolol.  Weight is down 4 lbs on higher Lasix.  No dyspnea walking on flat ground though she fatigues easily in general.  Oxygen saturation is staying in 90s.  Mild dyspnea with stairs. Overall, feels good.  Less peripheral edema.  No chest pain or lightheadedness.     6 minute walk (1/19): 92 m, oxygen  saturation dropped to 70%.   6 minute walk (3/19): 122 m 6 minute walk (4/19): 392 m 6 minute walk (9/19): 317 m 6 minute walk (1/20): 320 m 6 minute walk (8/20): 359 m 6 minute walk (10/20): 391 m 6 minute walk (9/21): 335 m, oxygen saturation 90-96% 6 minute walk (1/22): 305 m  Labs (1/19): anti-Jo1 negative, RF borderline positive, SCL-70 negative, HIV negative. ESR 21. BNP 1129.  Labs (3/19): ANA + => anti-dsDNA negative, SSA/SSB negative, anti-Sm negative, anti-RNP negative. K 3.8 => 3.7, creatinine 1.4 => 1.55.   Labs (4/19): K 4.1 => 3.1, creatinine 1.58 => 1.2, BNP 1902, CCP Ab negative.  Labs (6/19): hgb 9, creatinine 1.66, K 3 Labs (7/19): K 4.9, creatinine 1.7 Labs (8/19): K 3.6, creatinine 1.36 Labs (9/19): K 4.1, creatinine 1.55, BNP 641 Labs (10/19): K 4.7, creatinine 1.88, BNP 595 Labs (1/20): Hgb 12.2 Labs (4/20): K 4.2, creatinine 2.2, hgb 8.4 Labs (7/20): K 3.2, creatinine 2.07, hgb 8 Labs (8/20): K 3.7, creatinine 1.46 Labs (9/20): K 4, creatinine 1.5 Labs (10/20): K 4.5, creatinine 1.75 Labs (2/21): K 4.1, creatinine 1.78 Labs (7/21): K 3.7, creatinine 1.22, hgb 12.2 Labs (1/22): TSH normal, Mg 2.3, creatinine 1.36, K 3, hgb 12.4  PMH: 1. PAD: Non-healing left ankle ulcer.  - ABIs 9/12 were normal.  2. Type 2 diabetes 3. Hypothyroidism 4. CKD stage 3 5. Hyperlipidemia 6. HTN 7. IBS 8. OSA: on CPAP.  9. Event monitor 5/18: No significant abnormality.  10. DVT in 1995 11. Pulmonary hypertension: Echo (4/18) with EF 65%, mild MR, mild TR, mild mitral stenosis, moderate pulmonary hypertension. - CTA chest 10/17: No PE.  - RHC (1/19): mean RA 11, PA 87/31 mean 51, mean PCWP 6, CI 3.58, PVR 7 WU.  - PFTs (1/19): mild obstruction, some restriction => severely decreased DLCO. - High resolution CT chest (1/19): no interstitial lung disease or emphysema. 7 mm nodule RUL.  - anti-Jo1 negative, RF borderline positive, SCL-70 negative, HIV negative. ANA + => anti-dsDNA negative, SSA/SSB negative, anti-Sm negative, anti-RNP negative. - V/Q scan 2/19: No e/o chronic PE.  - Echo (4/19): EF 60-65%, mild LVH, mild aortic stenosis, mild mitral stenosis, moderately dilated RV with mildly decreased systolic function, PASP 36 mmHg (poor envelope, may be underestimated).  - Does not tolerate ERAs (macitentan, ambrisentan).  - RHC (4/20): mean RA 5, PA 85/22 mean 51, mean PCWP 12, CI  3.5, PVR 6 WU.  - Echo (4/20): EF > 65%, moderate LVH, RV moderately dilated with normal systolic function,  moderate pericardial effusion, mild-moderate mitral stenosis with mean gradient 7/MVA by PHT 2.48 cm^2, mild AS mean gradient 12.  - Echo (4/21): EF 44-31%, grade 2 diastolic dysfunction, normal RV, PASP 50 mmHg, probably mild mitral stenosis with mean gradient 7 mmHg and MVA 2 cm^2 by PHT, mild AS.  12. Coronary angiography 11/18 without significant disease.  - Coronary angiography in 1/19 with luminal irregularities 13. Mitral stenosis: Mild by cath in 1/19, mean gradient 2.6 mmHg. Mild on 4/19 echo.  - Mild-moderate by echo in 4/20.  - Mild by echo in 4/21.  14. Aortic stenosis: Mild on 4/19 echo.  - Mild on 4/20 echo.  - Mild on 4/21 echo.  15. Fe deficiency anemia: EGD showed gastritis and c-scope showed diverticulosis and polyps in 4/19. Capsule endoscopy was unrevealing.  16. Pulmonary nodule: CT chest 7/19 with RUL nodule.  17. Low back pain/sciatica 18. COVID-19 PNA in 2/21.   Current Outpatient Medications  Medication Sig Dispense Refill  . amLODipine (NORVASC) 5 MG tablet Take 1 tablet (5 mg total) by mouth daily. 90 tablet 3  . atorvastatin (LIPITOR) 20 MG tablet Take 20 mg by mouth every morning.    . cetirizine (ZYRTEC) 10 MG tablet Take 10 mg by mouth daily as needed for allergies.    . fenofibrate 160 MG tablet Take 160 mg by mouth every morning.    . ferrous sulfate 325 (65 FE) MG tablet Take 325 mg by mouth daily with breakfast. Morning and night    . furosemide (LASIX) 80 MG tablet Take 80 mg by mouth 2 (two) times daily.    Marland Kitchen glimepiride (AMARYL) 4 MG tablet Take 4 mg by mouth daily with breakfast.    . levothyroxine (SYNTHROID, LEVOTHROID) 125 MCG tablet Take 125 mcg by mouth daily before breakfast.    . loperamide (IMODIUM A-D) 2 MG tablet Take 2 mg by mouth 4 (four) times daily as needed for diarrhea or loose stools.    Marland Kitchen loratadine (CLARITIN) 10 MG tablet Take 10 mg by mouth daily as needed for allergies.     . metolazone (ZAROXOLYN) 2.5 MG tablet Take 1 tablet (2.5  mg total) by mouth once a week. Take potassium 98mq with this 4 tablet 6  . metoprolol succinate (TOPROL XL) 25 MG 24 hr tablet Take 1 tablet (25 mg total) by mouth daily. 30 tablet 11  . Multiple Vitamins-Minerals (PRESERVISION AREDS 2 PO) Take 1 capsule by mouth 2 (two) times daily.    . pantoprazole (PROTONIX) 40 MG tablet Take 40 mg by mouth 2 (two) times daily.    . potassium chloride (KLOR-CON) 10 MEQ tablet Take 2 tablets (20 mEq total) by mouth daily. Take an extra 2 tabs (2 tabs) when you take Metolazone 70 tablet 3  . Riociguat 2.5 MG TABS Take 2.5 mg by mouth 3 (three) times daily. 270 tablet 3  . SELEXIPAG PO Take 2,400 mcg by mouth 2 (two) times daily.    . traMADol (ULTRAM) 50 MG tablet Take 50 mg by mouth 2 (two) times daily as needed.     No current facility-administered medications for this encounter.    Allergies:   Other and Adhesive [tape]   Social History:  The patient  reports that she has never smoked. She has never used smokeless tobacco. She reports that she does not drink alcohol and does not use drugs.   Family History:  The patient's family history is not on file.   ROS:  Please see the history of present illness.   All other systems are personally reviewed and negative.   Exam:   BP (!) 118/50   Pulse 81   Wt 69.1 kg (152 lb 6.4 oz)   SpO2 97%   BMI 25.36 kg/m  Exam:  (Video/Tele Health Call; Exam is subjective and or/visual.) General:  Speaks in full sentences. No resp difficulty. Neck: JVP 8 cm Lungs: Normal respiratory effort with conversation.  Abdomen: Non-distended per patient report Extremities: 1+ ankle edema.  Neuro: Alert & oriented x 3.   Recent Labs: 07/03/2020: ALT 9 09/18/2020: Hemoglobin 12.4; Magnesium 2.3; Platelets 332; TSH 0.568 09/23/2020: B Natriuretic Peptide 342.3; BUN 43; Creatinine, Ser 1.55; Potassium 3.6; Sodium 138  Personally reviewed   Wt Readings from Last 3 Encounters:  09/23/20 69.1 kg (152 lb 6.4 oz)  09/18/20  70.9 kg (156 lb 6.4 oz)  08/10/20 67.1 kg (148 lb)    ASSESSMENT AND PLAN:  1.  Pulmonary hypertension with RV failure:  Severe PAH with PVR 7 WU on 1/19 RHC.  Etiology uncertain.  She has a history of DVT remotely but V/Q scan in 2/19 did not show evidence for chronic PE. She has OSA and uses CPAP.  Serologic workup showed only positive ANA but when additional serologies were done, they were all negative (see above).  CT chest with no ILD or emphysema.  Valvular heart disease does not appear significant enough to cause this degree of PH.  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  Echo in 4/19 showed that the RV is moderately dilated with mildly decreased systolic function, PA systolic pressure estimation was only 36 mmHg but poor envelope and likely underestimated. She did not tolerate Opsumit or ambrisentan due to significant lower extremity edema and worsening dyspnea.  [place] in 4/20 showed severe pulmonary hypertension with normal filling pressures and preserved cardiac output.  Echo in 4/21 showed normal RV with PASP 50 mmHg. Symptomatically improved since last appointment.  6 minute walk mildly less but she overall feels quite good.  Mild volume overload on exam but improved.  - Continue Adempas, she is at goal.  [name] is up to 2400 mcg BID.   - Continue Lasix 80 mg bid, BMET/BNP today. - Continue metolazone 2.5 qwk with an extra KCl 20 on metolazone days.   - Repeat echo at followup in 3 months.   2. Syncope: Suspect related to severe pulmonary hypertension.  None recently.   3. Mitral stenosis: Noted on echo and cath, probably mild on 4/21 echo.  - Repeat echo in 3 months.  4. H/o DVT: No chronic PE on prior V/Q scan.   5. HTN: BP controlled, she is now off losartan.  6. OSA: Continue CPAP qHS.  7. CKD: Stage 3.  BMET today.   8. Aortic stenosis: Mild on 4/21 echo.   9. H/o GI bleeding: Slow chronic GI bleed, source has not been found despite extensive workup.  - CBC today.   COVID screen The patient does  not have any symptoms that suggest any further testing/ screening at this time.  Social distancing reinforced today.  Patient Risk: After full review of this patients clinical status, I feel that they are at moderate risk for cardiac decompensation at this time.  Relevant cardiac medications were reviewed at length with the patient today. The patient does not have concerns regarding their medications at this time.   Recommended follow-up:  3 months with echo  Today, I have spent 17 minutes with the patient with telehealth technology discussing the above issues .    Signed, Loralie Champagne, MD  09/23/2020  [place] [address] [facility] [place] [address] [phone] (office) [phone] (fax)

## 2020-09-23 NOTE — Patient Instructions (Signed)
Labs done today, your results will be available in MyChart, we will contact you for abnormal readings.  Your physician recommends that you schedule a follow-up appointment in:  3 months  If you have any questions or concerns before your next appointment please send us a message through mychart or call our office at [phone].    TO LEAVE A MESSAGE FOR THE NURSE SELECT OPTION 2, PLEASE LEAVE A MESSAGE INCLUDING: . YOUR NAME . DATE OF BIRTH . CALL BACK NUMBER . REASON FOR CALL**this is important as we prioritize the call backs  YOU WILL RECEIVE A CALL BACK THE SAME DAY AS LONG AS YOU CALL BEFORE 4:00 PM  At the [facility], you and your health needs are our priority. As part of our continuing mission to provide you with exceptional heart care, we have created designated Provider Care Teams. These Care Teams include your primary Cardiologist (physician) and Advanced Practice Providers (APPs- Physician Assistants and Nurse Practitioners) who all work together to provide you with the care you need, when you need it.   You may see any of the following providers on your designated Care Team at your next follow up: . Dr Daniel Bensimhon . Dr Dalton McLean . Amy Clegg, NP . Brittainy Simmons, PA . Lauren Kemp, PharmD   Please be sure to bring in all your medications bottles to every appointment.     

## 2020-09-23 NOTE — Addendum Note (Signed)
Encounter addended by: Larey Dresser, MD on: 09/23/2020 9:29 PM  Actions taken: Clinical Note Signed, Level of Service modified

## 2020-09-23 NOTE — Progress Notes (Signed)
6 Min Walk Test Completed  Pt ambulated 304.8 meters O2 Sat ranged 95-96%  on room air HR ranged 84-104

## 2020-09-25 ENCOUNTER — Telehealth (HOSPITAL_COMMUNITY): Payer: Self-pay | Admitting: Pharmacy Technician

## 2020-09-25 ENCOUNTER — Encounter (HOSPITAL_COMMUNITY): Payer: Self-pay | Admitting: Cardiology

## 2020-09-25 NOTE — Telephone Encounter (Signed)
Received a message from the patient. Her previous [place] grants are expired and none are available at this time.  Started an [employer] for [employer] and [employer] assistance. Called the patient back and left a message regarding getting her signature and POI for the applications.  Will follow up.

## 2020-09-30 NOTE — Telephone Encounter (Signed)
Spoke with patient, emailed both applications to her to sign.  Will follow up.

## 2020-10-05 ENCOUNTER — Telehealth (HOSPITAL_COMMUNITY): Payer: Self-pay | Admitting: Cardiology

## 2020-10-05 MED ORDER — METOPROLOL SUCCINATE ER 25 MG PO TB24: 25.0000 mg | ORAL_TABLET | Freq: Two times a day (BID) | ORAL | 11 refills | Status: DC

## 2020-10-05 NOTE — Telephone Encounter (Signed)
Pt aware and voiced understanding    Rafael Bihari, [place]  10/05/2020 10:32 AM EST      Please have patient increase Toprol XL 25 mg bid. Thanks!

## 2020-10-05 NOTE — Telephone Encounter (Signed)
-----   Message from Rafael Bihari, [place] sent at 10/05/2020 10:32 AM EST ----- Please have patient increase Toprol XL 25 mg bid. Thanks!

## 2020-10-06 NOTE — Telephone Encounter (Addendum)
Was able to secure a $10,000 [employer] grant for the patient.   Effective Dates: 09/05/20-09/05/21  Pharmacy Card  Id [id number]  Group [id number] PCN [id number]  BIN [id number]  Called and provided [facility] the billing information. This grant will work for both Therapist, art. Patient is aware.  Charlann Boxer, CPhT

## 2020-10-12 ENCOUNTER — Encounter: Payer: Self-pay | Admitting: Medical

## 2020-10-12 ENCOUNTER — Ambulatory Visit (INDEPENDENT_AMBULATORY_CARE_PROVIDER_SITE_OTHER): Payer: Medicare Other | Admitting: Medical

## 2020-10-12 ENCOUNTER — Encounter (HOSPITAL_COMMUNITY): Payer: Self-pay

## 2020-10-12 ENCOUNTER — Other Ambulatory Visit: Payer: Self-pay

## 2020-10-12 VITALS — BP 120/50 | HR 73 | Resp 18 | Ht 65.0 in | Wt 151.0 lb

## 2020-10-12 DIAGNOSIS — Z1159 Encounter for screening for other viral diseases: Secondary | ICD-10-CM

## 2020-10-12 DIAGNOSIS — R000 Tachycardia, unspecified: Secondary | ICD-10-CM | POA: Diagnosis not present

## 2020-10-12 DIAGNOSIS — I10 Essential (primary) hypertension: Secondary | ICD-10-CM

## 2020-10-12 DIAGNOSIS — E1151 Type 2 diabetes mellitus with diabetic peripheral angiopathy without gangrene: Secondary | ICD-10-CM | POA: Diagnosis not present

## 2020-10-12 DIAGNOSIS — I2510 Atherosclerotic heart disease of native coronary artery without angina pectoris: Secondary | ICD-10-CM

## 2020-10-12 NOTE — Patient Instructions (Addendum)
Your blood pressure is well controlled today. Continue amlodipine 5 mg po q day.  For diabetes continue amaryl 5 mg daily. Recheck a1c later this week.  For high cholesterol and CAD will place future lipid panel and cmp. Continue lipitor.  Your psvt(tachcardia) and palpitation controlled with toprol xl 25 mg twice daily.  Follow up in 3 months or as needed  Will get pneumonia 23  vaccine today.  Please get eye MD to send [place] diabetic eye exam report when done.

## 2020-10-12 NOTE — Addendum Note (Signed)
Addended by: Kelle Darting A on: 10/12/2020 03:19 PM   Modules accepted: Orders

## 2020-10-12 NOTE — Progress Notes (Signed)
Subjective:    Patient ID: Alisha [name], female    DOB: July 27, 1941, 80 y.o.   MRN: [id number]  HPI  Pt in for follow up.  Pt give me up date that she has some palpitations and tachycardia.She had evaluation by cardiologist. Pt was placed on b blocker. Pt states feels like this resolved issues.  Before she got monitor had resting heart rate briefly on her apple watch of 120 at rest.  She had some supraventricular tachycardia, pac and pvc.   Since on b blocker symptoms controlled. Pt describes that she had zio patch placed.   Hx of elevated sugar. Last a1c 6.3. Pt is on amaryl.  Pt not fasting had scramble eggs and bacon.    Review of Systems  Constitutional: Negative for chills, fatigue and fever.  Respiratory: Negative for cough, chest tightness, shortness of breath and wheezing.   Cardiovascular: Negative for chest pain and palpitations.  Gastrointestinal: Negative for abdominal pain.  Genitourinary: Negative for dysuria.  Musculoskeletal: Negative for back pain.  Skin: Negative for rash.  Neurological: Negative for dizziness, speech difficulty, weakness and headaches.  Hematological: Negative for adenopathy. Does not bruise/bleed easily.  Psychiatric/Behavioral: Negative for behavioral problems and confusion.    Past Medical History:  Diagnosis Date  . Aortic atherosclerosis ([place]) 08/30/2017  . Aortic stenosis 08/30/2017  . CAD (coronary artery disease), native coronary artery 08/30/2017  . CHF (congestive heart failure) ([place])   . Chronic diastolic heart failure ([place]) 08/30/2017  . Diabetes mellitus with peripheral vascular disease ([place]) 07/14/2014  . Hypertensive heart disease without CHF 08/30/2017  . Hypothyroidism (acquired) 08/30/2017  . Pulmonary hypertension ([place]) 07/04/2017     Social History   Socioeconomic History  . Marital status: Divorced    Spouse name: Not on file  . Number of children: Not on file  . Years of education: Not on file  .  Highest education level: Not on file  Occupational History  . Not on file  Tobacco Use  . Smoking status: Never Smoker  . Smokeless tobacco: Never Used  Substance and Sexual Activity  . Alcohol use: No  . Drug use: No  . Sexual activity: Never  Other Topics Concern  . Not on file  Social History Narrative  . Not on file   Social Determinants of Health   Financial Resource Strain: Not on file  Food Insecurity: Not on file  Transportation Needs: Not on file  Physical Activity: Not on file  Stress: Not on file  Social Connections: Not on file  Intimate Partner Violence: Not on file    Past Surgical History:  Procedure Laterality Date  . ANKLE SURGERY    . APPENDECTOMY    . DILATION AND CURETTAGE OF UTERUS    . RIGHT HEART CATH N/A 01/01/2019   Procedure: RIGHT HEART CATH;  Surgeon: Larey Dresser, MD;  Location: [facility];  Service: Cardiovascular;  Laterality: N/A;  . RIGHT/LEFT HEART CATH AND CORONARY ANGIOGRAPHY N/A 09/27/2017   Procedure: RIGHT/LEFT HEART CATH AND CORONARY ANGIOGRAPHY;  Surgeon: Sherren Mocha, MD;  Location: [facility];  Service: Cardiovascular;  Laterality: N/A;    No family history on file.  Allergies  Allergen Reactions  . Other Palpitations    Surgical metal (stents, pins, plates) keeps pt from healing  . Adhesive [Tape] Other (See Comments)    Irritates skin with rash    Current Outpatient Medications on File Prior to Visit  Medication Sig Dispense Refill  .  amLODipine (NORVASC) 5 MG tablet Take 1 tablet (5 mg total) by mouth daily. 90 tablet 3  . atorvastatin (LIPITOR) 20 MG tablet Take 20 mg by mouth every morning.    . cetirizine (ZYRTEC) 10 MG tablet Take 10 mg by mouth daily as needed for allergies.    . fenofibrate 160 MG tablet Take 160 mg by mouth every morning.    . ferrous sulfate 325 (65 FE) MG tablet Take 325 mg by mouth daily with breakfast. Morning and night    . furosemide (LASIX) 80 MG tablet Take 80 mg by  mouth 2 (two) times daily.    Marland Kitchen glimepiride (AMARYL) 4 MG tablet Take 4 mg by mouth daily with breakfast.    . levothyroxine (SYNTHROID, LEVOTHROID) 125 MCG tablet Take 125 mcg by mouth daily before breakfast.    . loperamide (IMODIUM A-D) 2 MG tablet Take 2 mg by mouth 4 (four) times daily as needed for diarrhea or loose stools.    Marland Kitchen loratadine (CLARITIN) 10 MG tablet Take 10 mg by mouth daily as needed for allergies.     . metolazone (ZAROXOLYN) 2.5 MG tablet Take 1 tablet (2.5 mg total) by mouth once a week. Take potassium 39meq with this 4 tablet 6  . metoprolol succinate (TOPROL XL) 25 MG 24 hr tablet Take 1 tablet (25 mg total) by mouth 2 (two) times daily. 60 tablet 11  . Multiple Vitamins-Minerals (PRESERVISION AREDS 2 PO) Take 1 capsule by mouth 2 (two) times daily.    . pantoprazole (PROTONIX) 40 MG tablet Take 40 mg by mouth 2 (two) times daily.    . potassium chloride (KLOR-CON) 10 MEQ tablet Take 2 tablets (20 mEq total) by mouth daily. Take an extra 2 tabs (2 tabs) when you take Metolazone 70 tablet 3  . Riociguat 2.5 MG TABS Take 2.5 mg by mouth 3 (three) times daily. 270 tablet 3  . SELEXIPAG PO Take 2,400 mcg by mouth 2 (two) times daily.    . traMADol (ULTRAM) 50 MG tablet Take 50 mg by mouth 2 (two) times daily as needed.     No current facility-administered medications on file prior to visit.    BP (!) 116/41   Pulse 73   Resp 18   Ht 5\' 5"  (1.651 m)   Wt 151 lb (68.5 kg)   SpO2 95%   BMI 25.13 kg/m       Objective:   Physical Exam  General Mental Status- Alert. General Appearance- Not in acute distress.   Skin General: Color- Normal Color. Moisture- Normal Moisture.  Neck Carotid Arteries- Normal color. Moisture- Normal Moisture. No carotid bruits. No JVD.  Chest and Lung Exam Auscultation: Breath Sounds:-Normal.  Cardiovascular Rrr. Murmur heard. 2/6 systolic murmur.  Abdomen Inspection:-Inspeection Normal. Palpation/Percussion:Note:No mass.  Palpation and Percussion of the abdomen reveal- Non Tender, Non Distended + BS, no rebound or guarding.    Neurologic Cranial Nerve exam:- CN III-XII intact(No nystagmus), symmetric smile. Strength:- 5/5 equal and symmetric strength both upper and lower extremities.      Assessment & Plan:  Your blood pressure is well controlled today. Continue amlodipine 5 mg po q day.  For diabetes continue amaryl 5 mg daily. Recheck a1c later this week.  For high cholesterol and CAD will place future lipid panel and cmp. Continue lipitor.  Your psvt(tachcardia) and palpitation controlled with toprol xl 25 mg twice daily.  Follow up in 3 months or as needed  Will get pneumonia 23  vaccine  today.  Please get eye MD to send [place] diabetic eye exam report when done.  Mackie Pai, PA-C

## 2020-10-13 ENCOUNTER — Other Ambulatory Visit (INDEPENDENT_AMBULATORY_CARE_PROVIDER_SITE_OTHER): Payer: Medicare Other

## 2020-10-13 ENCOUNTER — Encounter: Payer: Self-pay | Admitting: Medical

## 2020-10-13 ENCOUNTER — Telehealth: Payer: Self-pay | Admitting: Medical

## 2020-10-13 DIAGNOSIS — E1151 Type 2 diabetes mellitus with diabetic peripheral angiopathy without gangrene: Secondary | ICD-10-CM

## 2020-10-13 DIAGNOSIS — D619 Aplastic anemia, unspecified: Secondary | ICD-10-CM | POA: Diagnosis not present

## 2020-10-13 DIAGNOSIS — I10 Essential (primary) hypertension: Secondary | ICD-10-CM | POA: Diagnosis not present

## 2020-10-13 DIAGNOSIS — R944 Abnormal results of kidney function studies: Secondary | ICD-10-CM

## 2020-10-13 LAB — LIPID PANEL
Cholesterol: 141 mg/dL (ref 0–200)
HDL: 52.9 mg/dL (ref >39.00)
LDL Cholesterol: 66 mg/dL (ref 0–99)
NonHDL: 88.21
Total CHOL/HDL Ratio: 3
Triglycerides: 111 mg/dL (ref 0.0–149.0)
VLDL: 22.2 mg/dL (ref 0.0–40.0)

## 2020-10-13 LAB — COMPREHENSIVE METABOLIC PANEL
ALT: 10 U/L (ref 0–35)
AST: 12 U/L (ref 0–37)
Albumin: 4 g/dL (ref 3.5–5.2)
Alkaline Phosphatase: 71 U/L (ref 39–117)
BUN: 42 mg/dL — ABNORMAL HIGH (ref 6–23)
CO2: 28 mEq/L (ref 19–32)
Calcium: 9.6 mg/dL (ref 8.4–10.5)
Chloride: 103 mEq/L (ref 96–112)
Creatinine, Ser: 1.15 mg/dL (ref 0.40–1.20)
GFR: 45.74 mL/min — ABNORMAL LOW (ref >60.00)
Glucose, Bld: 137 mg/dL — ABNORMAL HIGH (ref 70–99)
Potassium: 4 mEq/L (ref 3.5–5.1)
Sodium: 140 mEq/L (ref 135–145)
Total Bilirubin: 0.4 mg/dL (ref 0.2–1.2)
Total Protein: 7.2 g/dL (ref 6.0–8.3)

## 2020-10-13 LAB — CBC WITH DIFFERENTIAL/PLATELET
Basophils Absolute: 0.1 10*3/uL (ref 0.0–0.1)
Basophils Relative: 0.9 % (ref 0.0–3.0)
Eosinophils Absolute: 0.2 10*3/uL (ref 0.0–0.7)
Eosinophils Relative: 3.2 % (ref 0.0–5.0)
HCT: 40.6 % (ref 36.0–46.0)
Hemoglobin: 13.2 g/dL (ref 12.0–15.0)
Lymphocytes Relative: 11.4 % — ABNORMAL LOW (ref 12.0–46.0)
Lymphs Abs: 0.8 10*3/uL (ref 0.7–4.0)
MCHC: 32.4 g/dL (ref 30.0–36.0)
MCV: 83.7 fl (ref 78.0–100.0)
Monocytes Absolute: 0.4 10*3/uL (ref 0.1–1.0)
Monocytes Relative: 6 % (ref 3.0–12.0)
Neutro Abs: 5.8 10*3/uL (ref 1.4–7.7)
Neutrophils Relative %: 78.5 % — ABNORMAL HIGH (ref 43.0–77.0)
Platelets: 234 10*3/uL (ref 150.0–400.0)
RBC: 4.85 Mil/uL (ref 3.87–5.11)
RDW: 19.3 % — ABNORMAL HIGH (ref 11.5–15.5)
WBC: 7.4 10*3/uL (ref 4.0–10.5)

## 2020-10-13 LAB — MICROALBUMIN / CREATININE URINE RATIO
Creatinine,U: 46.8 mg/dL
Microalb Creat Ratio: 8.3 mg/g (ref 0.0–30.0)
Microalb, Ur: 3.9 mg/dL — ABNORMAL HIGH (ref 0.0–1.9)

## 2020-10-13 LAB — HEMOGLOBIN A1C: Hgb A1c MFr Bld: 7 % — ABNORMAL HIGH (ref 4.6–6.5)

## 2020-10-13 LAB — IRON: Iron: 51 ug/dL (ref 42–145)

## 2020-10-13 MED ORDER — METFORMIN HCL 500 MG PO TABS: 500.0000 mg | ORAL_TABLET | Freq: Two times a day (BID) | ORAL | 3 refills | Status: DC

## 2020-10-13 NOTE — Addendum Note (Signed)
Addended by: Manuela Schwartz on: 10/13/2020 08:28 AM   Modules accepted: Orders

## 2020-10-13 NOTE — Telephone Encounter (Signed)
Future cmp placed. 

## 2020-10-19 LAB — HM DIABETES EYE EXAM

## 2020-10-30 ENCOUNTER — Other Ambulatory Visit: Payer: Self-pay | Admitting: Medical

## 2020-10-30 ENCOUNTER — Encounter (HOSPITAL_COMMUNITY): Payer: Medicare Other

## 2020-11-14 ENCOUNTER — Encounter: Payer: Self-pay | Admitting: Medical

## 2020-11-17 MED ORDER — FUROSEMIDE 80 MG PO TABS
80.0000 mg | ORAL_TABLET | Freq: Two times a day (BID) | ORAL | 2 refills | Status: DC
Start: 2020-11-17 — End: 2021-04-07

## 2020-11-17 MED ORDER — GLIMEPIRIDE 4 MG PO TABS: 4.0000 mg | ORAL_TABLET | Freq: Every day | ORAL | 2 refills | Status: DC

## 2020-11-17 MED ORDER — METFORMIN HCL 500 MG PO TABS: 500.0000 mg | ORAL_TABLET | Freq: Two times a day (BID) | ORAL | 1 refills | Status: DC

## 2020-11-17 MED ORDER — FERROUS SULFATE 325 (65 FE) MG PO TABS: 325.0000 mg | ORAL_TABLET | Freq: Every day | ORAL | 0 refills | Status: DC

## 2020-11-17 MED ORDER — LEVOTHYROXINE SODIUM 125 MCG PO TABS: 125.0000 ug | ORAL_TABLET | Freq: Every day | ORAL | 2 refills | Status: AC

## 2020-11-17 MED ORDER — AMLODIPINE BESYLATE 5 MG PO TABS: 5.0000 mg | ORAL_TABLET | Freq: Every day | ORAL | 3 refills | Status: DC

## 2020-11-17 MED ORDER — METOPROLOL SUCCINATE ER 25 MG PO TB24: 25.0000 mg | ORAL_TABLET | Freq: Two times a day (BID) | ORAL | 11 refills | Status: AC

## 2020-11-17 MED ORDER — ATORVASTATIN CALCIUM 20 MG PO TABS: 20.0000 mg | ORAL_TABLET | ORAL | 2 refills | Status: DC

## 2020-11-17 MED ORDER — FENOFIBRATE 160 MG PO TABS
160.0000 mg | ORAL_TABLET | ORAL | 2 refills | Status: AC
Start: 2020-11-17 — End: ?

## 2020-11-23 ENCOUNTER — Other Ambulatory Visit (INDEPENDENT_AMBULATORY_CARE_PROVIDER_SITE_OTHER): Payer: Medicare Other

## 2020-11-23 ENCOUNTER — Other Ambulatory Visit: Payer: Self-pay

## 2020-11-23 DIAGNOSIS — R944 Abnormal results of kidney function studies: Secondary | ICD-10-CM

## 2020-11-23 LAB — COMPREHENSIVE METABOLIC PANEL
ALT: 6 U/L (ref 0–35)
AST: 9 U/L (ref 0–37)
Albumin: 4 g/dL (ref 3.5–5.2)
Alkaline Phosphatase: 71 U/L (ref 39–117)
BUN: 28 mg/dL — ABNORMAL HIGH (ref 6–23)
CO2: 26 mEq/L (ref 19–32)
Calcium: 9.4 mg/dL (ref 8.4–10.5)
Chloride: 105 mEq/L (ref 96–112)
Creatinine, Ser: 1.02 mg/dL (ref 0.40–1.20)
GFR: 52.78 mL/min — ABNORMAL LOW (ref >60.00)
Glucose, Bld: 81 mg/dL (ref 70–99)
Potassium: 3.6 mEq/L (ref 3.5–5.1)
Sodium: 141 mEq/L (ref 135–145)
Total Bilirubin: 0.7 mg/dL (ref 0.2–1.2)
Total Protein: 7 g/dL (ref 6.0–8.3)

## 2020-12-07 ENCOUNTER — Ambulatory Visit: Payer: Medicare Other | Admitting: Dermatology

## 2020-12-07 ENCOUNTER — Encounter: Payer: Self-pay | Admitting: Dermatology

## 2020-12-07 ENCOUNTER — Other Ambulatory Visit: Payer: Self-pay

## 2020-12-07 DIAGNOSIS — C44321 Squamous cell carcinoma of skin of nose: Secondary | ICD-10-CM

## 2020-12-07 DIAGNOSIS — L821 Other seborrheic keratosis: Secondary | ICD-10-CM

## 2020-12-07 DIAGNOSIS — D485 Neoplasm of uncertain behavior of skin: Secondary | ICD-10-CM

## 2020-12-07 DIAGNOSIS — Z1283 Encounter for screening for malignant neoplasm of skin: Secondary | ICD-10-CM | POA: Diagnosis not present

## 2020-12-07 DIAGNOSIS — D1801 Hemangioma of skin and subcutaneous tissue: Secondary | ICD-10-CM

## 2020-12-07 DIAGNOSIS — L814 Other melanin hyperpigmentation: Secondary | ICD-10-CM | POA: Diagnosis not present

## 2020-12-07 DIAGNOSIS — L84 Corns and callosities: Secondary | ICD-10-CM

## 2020-12-07 NOTE — Patient Instructions (Signed)

## 2020-12-09 ENCOUNTER — Other Ambulatory Visit (HOSPITAL_COMMUNITY): Payer: Self-pay | Admitting: Family Medicine

## 2020-12-13 ENCOUNTER — Encounter: Payer: Self-pay | Admitting: Dermatology

## 2020-12-16 ENCOUNTER — Telehealth: Payer: Self-pay | Admitting: *Deleted

## 2020-12-16 NOTE — Telephone Encounter (Signed)
-----   Message from Lavonna Monarch, MD sent at 12/16/2020  5:21 AM EDT ----- Schedule surgery with Dr. Darene Lamer

## 2020-12-16 NOTE — Telephone Encounter (Signed)
Left message for patient to return our phone call for pathology results.

## 2020-12-18 ENCOUNTER — Encounter: Payer: Self-pay | Admitting: Dermatology

## 2020-12-18 NOTE — Addendum Note (Signed)
Addended by: Lavonna Monarch on: 12/18/2020 09:55 AM   Modules accepted: Level of Service

## 2020-12-18 NOTE — Progress Notes (Signed)
   Follow-Up Visit   Subjective  Alisha [name] is a 80 y.o. female who presents for the following: Annual Exam (Full body skin check. Lesion on nose x year, scaly, scabs, not tender, seems to be decreasing. Lesion, skin tag per patient, on the right side of neck. Scaly lesion on left shoulder x months.).  General skin check, new spot on upper nose Location:  Duration:  Quality:  Associated Signs/Symptoms: Modifying Factors:  Severity:  Timing: Context:   Objective  Well appearing patient in no apparent distress; mood and affect are within normal limits. Objective  Left Suprapubic Area: Full body skin check. No atypical moles. Per Dr. Denna Haggard patient has 3 dark moles on right side of back.  All dermoscopic were identical.  Objective  Right Parietal Scalp: flat spots on your skin that are darker than your usual skin tone.  Objective  Dorsum of Nose: Subtle waxy pink 6 mm flat crust rule out superficial SCCA     Objective  Left Upper Back, Right Forearm - Posterior: Flattopped textured 5 mm brown papules    A full examination was performed including scalp, head, eyes, ears, nose, lips, neck, chest, axillae, abdomen, back, buttocks, bilateral upper extremities, bilateral lower extremities, hands, feet, fingers, toes, fingernails, and toenails. All findings within normal limits unless otherwise noted below.  Areas beneath undergarments not fully examined.   Assessment & Plan    Screening exam for skin cancer Left Suprapubic Area  Yearly skin check.  Lentigo Right Parietal Scalp  Check as needed change  Cherry angioma Left Buccal Cheek  Neoplasm of uncertain behavior of skin Dorsum of Nose  Skin / nail biopsy Type of biopsy: tangential   Informed consent: discussed and consent obtained   Timeout: patient name, date of birth, surgical site, and procedure verified   Procedure prep:  Patient was prepped and draped in usual sterile fashion (Non sterile) Prep  type:  Chlorhexidine Anesthesia: the lesion was anesthetized in a standard fashion   Anesthetic:  1% lidocaine w/ epinephrine 1-100,000 local infiltration Instrument used: flexible razor blade   Hemostasis achieved with: electrodesiccation   Outcome: patient tolerated procedure well   Post-procedure details: wound care instructions given    Specimen 1 - Surgical pathology Differential Diagnosis: bcc vs scc Cautery only Check Margins: No  Seborrheic keratosis (2) Right Forearm - Posterior; Left Upper Back  Leave if stable  Corns and callosities Right Tip of Hallux      I, Lavonna Monarch, MD, have reviewed all documentation for this visit.  The documentation on 12/18/20 for the exam, diagnosis, procedures, and orders are all accurate and complete.

## 2020-12-24 ENCOUNTER — Ambulatory Visit (HOSPITAL_COMMUNITY)
Admission: RE | Admit: 2020-12-24 | Discharge: 2020-12-24 | Disposition: A | Discharge status: Home / Self Care | Payer: Medicare Other | Source: Ambulatory Visit | Attending: Medical | Admitting: Medical

## 2020-12-24 ENCOUNTER — Ambulatory Visit (HOSPITAL_BASED_OUTPATIENT_CLINIC_OR_DEPARTMENT_OTHER)
Admission: RE | Admit: 2020-12-24 | Discharge: 2020-12-24 | Disposition: A | Discharge status: Home / Self Care | Payer: Medicare Other | Source: Ambulatory Visit | Attending: Cardiology | Admitting: Cardiology

## 2020-12-24 ENCOUNTER — Other Ambulatory Visit: Payer: Self-pay

## 2020-12-24 ENCOUNTER — Encounter (HOSPITAL_COMMUNITY): Payer: Self-pay | Admitting: Cardiology

## 2020-12-24 ENCOUNTER — Other Ambulatory Visit (HOSPITAL_COMMUNITY): Payer: Self-pay | Admitting: *Deleted

## 2020-12-24 VITALS — BP 142/60 | HR 75 | Wt 162.0 lb

## 2020-12-24 DIAGNOSIS — I2510 Atherosclerotic heart disease of native coronary artery without angina pectoris: Secondary | ICD-10-CM | POA: Insufficient documentation

## 2020-12-24 DIAGNOSIS — E119 Type 2 diabetes mellitus without complications: Secondary | ICD-10-CM | POA: Insufficient documentation

## 2020-12-24 DIAGNOSIS — I110 Hypertensive heart disease with heart failure: Secondary | ICD-10-CM | POA: Insufficient documentation

## 2020-12-24 DIAGNOSIS — I509 Heart failure, unspecified: Secondary | ICD-10-CM | POA: Diagnosis not present

## 2020-12-24 DIAGNOSIS — I5032 Chronic diastolic (congestive) heart failure: Secondary | ICD-10-CM | POA: Diagnosis not present

## 2020-12-24 DIAGNOSIS — I080 Rheumatic disorders of both mitral and aortic valves: Secondary | ICD-10-CM | POA: Insufficient documentation

## 2020-12-24 DIAGNOSIS — I272 Pulmonary hypertension, unspecified: Secondary | ICD-10-CM

## 2020-12-24 DIAGNOSIS — I050 Rheumatic mitral stenosis: Secondary | ICD-10-CM

## 2020-12-24 DIAGNOSIS — I5189 Other ill-defined heart diseases: Secondary | ICD-10-CM | POA: Diagnosis not present

## 2020-12-24 LAB — BASIC METABOLIC PANEL
Anion gap: 10 (ref 5–15)
BUN: 24 mg/dL — ABNORMAL HIGH (ref 8–23)
CO2: 23 mmol/L (ref 22–32)
Calcium: 8.8 mg/dL — ABNORMAL LOW (ref 8.9–10.3)
Chloride: 107 mmol/L (ref 98–111)
Creatinine, Ser: 1.4 mg/dL — ABNORMAL HIGH (ref 0.44–1.00)
GFR, Estimated: 39 mL/min — ABNORMAL LOW (ref >60)
Glucose, Bld: 90 mg/dL (ref 70–99)
Potassium: 3.5 mmol/L (ref 3.5–5.1)
Sodium: 140 mmol/L (ref 135–145)

## 2020-12-24 LAB — ECHOCARDIOGRAM COMPLETE
AR max vel: 1.03 cm2
AV Area VTI: 1.06 cm2
AV Area mean vel: 1 cm2
AV Mean grad: 27 mmHg
AV Peak grad: 44.8 mmHg
Ao pk vel: 3.35 m/s
Area-P 1/2: 2.42 cm2
MV VTI: 1.28 cm2
S' Lateral: 2.1 cm

## 2020-12-24 NOTE — Progress Notes (Signed)
  Echocardiogram 2D Echocardiogram has been performed.  Alisha [name] 12/24/2020, 11:00 AM

## 2020-12-24 NOTE — Patient Instructions (Signed)
Take Metolazone Friday AM with your Furosemide, then again on Sunday morning, then take it EVERY Saturday  Labs done today, your results will be available in MyChart, we will contact you for abnormal readings.  Your physician has requested that you have a TEE. During a TEE, sound waves are used to create images of your heart. It provides your doctor with information about the size and shape of your heart and how well your heart's chambers and valves are working. In this test, a transducer is attached to the end of a flexible tube that's guided down your throat and into your esophagus (the tube leading from you mouth to your stomach) to get a more detailed image of your heart. You are not awake for the procedure. Please see the instruction sheet given to you today. For further information please visit [email or web address].  Your physician recommends that you schedule a follow-up appointment in: 3-4 weeks  If you have any questions or concerns before your next appointment please send [place] a message through [place] or call our office at [phone].    TO LEAVE A MESSAGE FOR THE NURSE SELECT OPTION 2, PLEASE LEAVE A MESSAGE INCLUDING: . YOUR NAME . DATE OF BIRTH . CALL BACK NUMBER . REASON FOR CALL**this is important as we prioritize the call backs  [place] AS LONG AS YOU CALL BEFORE 4:00 PM  At the [facility], you and your health needs are our priority. As part of our continuing mission to provide you with exceptional heart care, we have created designated Provider Care Teams. These Care Teams include your primary Cardiologist (physician) and Advanced Practice Providers (APPs- Physician Assistants and Nurse Practitioners) who all work together to provide you with the care you need, when you need it.   You may see any of the following providers on your designated Care Team at your next follow up: Marland Kitchen Dr Glori Bickers . Dr Loralie Champagne . Dr  Vickki Muff . Darrick Grinder, NP . Lyda Jester, [place] . Audry Riles, PharmD   Please be sure to bring in all your medications bottles to every appointment.    TEE INSTRUCTIONS:  You are scheduled for a TEE Thursday 01/07/21 with Dr. Aundra Dubin.    Please arrive at the [facility] (Main Entrance A) at [facility]: [address] [place], [place] [address] at 8:30 am  DIET: Nothing to eat or drink after midnight except a sip of water with medications (see medication instructions below)  Medication Instructions: Hold FUROSEMIDE, GLIMEPIRIDE, AND METFORMIN THUR 4/28 AM  COVID TEST: Tue 01/05/21 at 11:15 AM, this is a drive thru service located at:   [employer], [place] must have a responsible person to drive you home and stay in the waiting area during your procedure. Failure to do so could result in cancellation.  Bring your insurance cards.  *Special Note: Every effort is made to have your procedure done on time. Occasionally there are emergencies that occur at the hospital that may cause delays. Please be patient if a delay does occur.

## 2020-12-24 NOTE — Progress Notes (Signed)
ID:  Alisha [name], DOB 06-24-1941, MRN [id number]   Provider location: [facility] Type of Visit: Established patient   PCP:  Alisha Pai, PA-C  Cardiologist:  Dr. Aundra [name]   History of Present Illness: Alisha [name] is a 80 y.o. female who has a history of diabetes, HTN, PAD, hyperlipidemia. She was referred by Dr. Wynonia [name] for evaluation of pulmonary hypertension.   For > 1 year, she has had significant exertional dyspnea. It has been steadily worsening, especially over the last few months.  She has been extensively worked up so far.  Echo in 4/18 showed preserved EF 65% with moderate pulmonary hypertension.  She had episodes of syncope in 5/18 and 10/18.  She wore an event monitor in 5/18 with no significant arrhythmia.  LHC/RHC in 1/19 showed normal PCWP and severely elevated PA pressure, no significant CAD.    At initial appointment in 1/19, she was noted to be hypoxemic with exertion and home oxygen was started for use with exertion.  I also started her on Opsumit. She had to stop this after about a week due to significantly increased exertional peripheral edema.  This resolved after Opsumit was stopped.   Echo in 4/19.  This showed EF 60-65%, mild MS, mild AS, moderately dilated RV with mildly decreased systolic function.   She was found to have Fe deficiency anemia, FOBT+.  EGD showed gastritis and c-scope showed diverticulosis and polyps in 4/19.  She get IV Fe.  She is going to have a capsule endoscopy.   She tried to start ambrisentan, but developed worsening dyspnea and peripheral edema and low oxygen saturation.  This improved after ambrisentan was stopped.   She is now taking riociguat and Uptravi.  RHC in 4/20 showed normal filling pressures but severe pulmonary hypertension with PVR 6 WU.  Echo showed moderately dilated RV with normal systolic function, mild-moderate MS, mild AS.  Alisha [name] has now been titrated up to 2400 mcg bid and riociguat  to 1.5 mg tid.    She had COVID-19 PNA in 2/21, hospitalized for about a week.   Echo in 4/21 showed EF 36-62%, grade 2 diastolic dysfunction, normal RV, PASP 50 mmHg, probably mild mitral stenosis with mean gradient 7 mmHg and MVA 2 cm^2 by PHT.  Echo was done today and reviewed, EF 60-65%, moderate LVH, moderate AS with mean gradient 27 mmHg and AVA 1.06 cm^2, moderate mitral stenosis with mean gradient 7 and MVA 1.2 cm^2, normal RV size and systolic function, PASP 41 mmHg.   She returns for followup of CHF and pulmonary hypertension.  Weight is up 10 lbs.  However, she says that she feels the best she has felt in a long time.  She has not taken metolazone in at least 3 wks (forgets).  No dyspnea walking on flat ground or walking up a flight of stairs.  No chest pain or dizziness.  She is generally able to do whatever she wants at this point.     6 minute walk (1/19): 92 m, oxygen saturation dropped to 70%.   6 minute walk (3/19): 122 m 6 minute walk (4/19): 392 m 6 minute walk (9/19): 317 m 6 minute walk (1/20): 320 m 6 minute walk (8/20): 359 m 6 minute walk (10/20): 391 m 6 minute walk (9/21): 335 m, oxygen saturation 90-96% 6 minute walk (1/22): 305 m  Labs (1/19): anti-Jo1 negative, RF borderline positive, SCL-70 negative, HIV negative. ESR 21. BNP 1129.  Labs (  3/19): ANA + => anti-dsDNA negative, SSA/SSB negative, anti-Sm negative, anti-RNP negative. K 3.8 => 3.7, creatinine 1.4 => 1.55.  Labs (4/19): K 4.1 => 3.1, creatinine 1.58 => 1.2, BNP 1902, CCP Ab negative.  Labs (6/19): hgb 9, creatinine 1.66, K 3 Labs (7/19): K 4.9, creatinine 1.7 Labs (8/19): K 3.6, creatinine 1.36 Labs (9/19): K 4.1, creatinine 1.55, BNP 641 Labs (10/19): K 4.7, creatinine 1.88, BNP 595 Labs (1/20): Hgb 12.2 Labs (4/20): K 4.2, creatinine 2.2, hgb 8.4 Labs (7/20): K 3.2, creatinine 2.07, hgb 8 Labs (8/20): K 3.7, creatinine 1.46 Labs (9/20): K 4, creatinine 1.5 Labs (10/20): K 4.5, creatinine  1.75 Labs (2/21): K 4.1, creatinine 1.78 Labs (7/21): K 3.7, creatinine 1.22, hgb 12.2 Labs (1/22): TSH normal, Mg 2.3, creatinine 1.36, K 3, hgb 12.4, BNP 342 Labs (2/22): LDL 66 Labs (3/22): K 3.6, creatinine 1.02  PMH: 1. PAD: Non-healing left ankle ulcer.  - ABIs 9/12 were normal.  2. Type 2 diabetes 3. Hypothyroidism 4. CKD stage 3 5. Hyperlipidemia 6. HTN 7. IBS 8. OSA: on CPAP.  9. Event monitor 5/18: No significant abnormality.  10. DVT in 1995 11. Pulmonary hypertension: Echo (4/18) with EF 65%, mild MR, mild TR, mild mitral stenosis, moderate pulmonary hypertension. - CTA chest 10/17: No PE.  - RHC (1/19): mean RA 11, PA 87/31 mean 51, mean PCWP 6, CI 3.58, PVR 7 WU.  - PFTs (1/19): mild obstruction, some restriction => severely decreased DLCO. - High resolution CT chest (1/19): no interstitial lung disease or emphysema. 7 mm nodule RUL.  - anti-Jo1 negative, RF borderline positive, SCL-70 negative, HIV negative. ANA + => anti-dsDNA negative, SSA/SSB negative, anti-Sm negative, anti-RNP negative. - V/Q scan 2/19: No e/o chronic PE.  - Echo (4/19): EF 60-65%, mild LVH, mild aortic stenosis, mild mitral stenosis, moderately dilated RV with mildly decreased systolic function, PASP 36 mmHg (poor envelope, may be underestimated).  - Does not tolerate ERAs (macitentan, ambrisentan).  - RHC (4/20): mean RA 5, PA 85/22 mean 51, mean PCWP 12, CI  3.5, PVR 6 WU.  - Echo (4/20): EF > 65%, moderate LVH, RV moderately dilated with normal systolic function, moderate pericardial effusion, mild-moderate mitral stenosis with mean gradient 7/MVA by PHT 2.48 cm^2, mild AS mean gradient 12.  - Echo (4/21): EF 69-62%, grade 2 diastolic dysfunction, normal RV, PASP 50 mmHg, probably mild mitral stenosis with mean gradient 7 mmHg and MVA 2 cm^2 by PHT, mild AS.  - Echo (4/22): EF 60-65%, moderate LVH, moderate AS with mean gradient 27 mmHg and AVA 1.06 cm^2, moderate mitral stenosis with mean  gradient 7 and MVA 1.2 cm^2, normal RV size and systolic function, PASP 41 mmHg.  12. Coronary angiography 11/18 without significant disease.  - Coronary angiography in 1/19 with luminal irregularities 13. Mitral stenosis: Mild by cath in 1/19, mean gradient 2.6 mmHg. Mild on 4/19 echo.  - Mild-moderate by echo in 4/20.  - Mild by echo in 4/21.  - Moderate by echo in 4/22.  14. Aortic stenosis: Mild on 4/19 echo.  - Mild on 4/20 echo.  - Mild on 4/21 echo.  - Moderate on 4/22 echo.  15. Fe deficiency anemia: EGD showed gastritis and c-scope showed diverticulosis and polyps in 4/19. Capsule endoscopy was unrevealing.  16. Pulmonary nodule: CT chest 7/19 with RUL nodule.  17. Low back pain/sciatica 18. COVID-19 PNA in 2/21.   Current Outpatient Medications  Medication Sig Dispense Refill  . amLODipine (NORVASC) 5 MG  tablet Take 1 tablet (5 mg total) by mouth daily. 90 tablet 3  . atorvastatin (LIPITOR) 20 MG tablet Take 1 tablet (20 mg total) by mouth every morning. 30 tablet 2  . cetirizine (ZYRTEC) 10 MG tablet Take 10 mg by mouth daily as needed for allergies.    . fenofibrate 160 MG tablet Take 1 tablet (160 mg total) by mouth every morning. 30 tablet 2  . ferrous sulfate 325 (65 FE) MG tablet Take 1 tablet (325 mg total) by mouth daily with breakfast. Morning and night 30 tablet 0  . furosemide (LASIX) 80 MG tablet Take 1 tablet (80 mg total) by mouth 2 (two) times daily. 60 tablet 2  . glimepiride (AMARYL) 4 MG tablet Take 1 tablet (4 mg total) by mouth daily with breakfast. 30 tablet 2  . levothyroxine (SYNTHROID) 125 MCG tablet Take 1 tablet (125 mcg total) by mouth daily before breakfast. 30 tablet 2  . loperamide (IMODIUM A-D) 2 MG tablet Take 2 mg by mouth 4 (four) times daily as needed for diarrhea or loose stools.    Marland Kitchen loratadine (CLARITIN) 10 MG tablet Take 10 mg by mouth daily as needed for allergies.     . metFORMIN (GLUCOPHAGE) 500 MG tablet Take 1 tablet (500 mg total) by  mouth 2 (two) times daily with a meal. 180 tablet 1  . metolazone (ZAROXOLYN) 2.5 MG tablet Take 1 tablet (2.5 mg total) by mouth once a week. Take potassium 40mq with this 4 tablet 6  . metoprolol succinate (TOPROL XL) 25 MG 24 hr tablet Take 1 tablet (25 mg total) by mouth 2 (two) times daily. 60 tablet 11  . Multiple Vitamins-Minerals (PRESERVISION AREDS 2 PO) Take 1 capsule by mouth 2 (two) times daily.    . pantoprazole (PROTONIX) 40 MG tablet Take 40 mg by mouth 2 (two) times daily.    . potassium chloride (KLOR-CON) 10 MEQ tablet TAKE 1 TABLET (10 MEQ TOTAL) BY MOUTH DAILY. TAKE AN EXTRA 20MEQ (2 TABLETS) EVERY WEEK WITH METOLAZONE 114 tablet 1  . Riociguat 2.5 MG TABS Take 2.5 mg by mouth 3 (three) times daily. 270 tablet 3  . SELEXIPAG PO Take 2,400 mcg by mouth 2 (two) times daily.    . traMADol (ULTRAM) 50 MG tablet Take 50 mg by mouth 2 (two) times daily as needed.     No current facility-administered medications for this encounter.    Allergies:   Other and Adhesive [tape]   Social History:  The patient  reports that she has never smoked. She has never used smokeless tobacco. She reports that she does not drink alcohol and does not use drugs.   Family History:  The patient's family history is not on file.   ROS:  Please see the history of present illness.   All other systems are personally reviewed and negative.   Exam:   BP (!) 142/60   Pulse 75   Wt 73.5 kg (162 lb)   SpO2 98%   BMI 26.96 kg/m  General: NAD Neck: JVP 10 cm, no thyromegaly or thyroid nodule.  Lungs: Clear to auscultation bilaterally with normal respiratory effort. CV: Nondisplaced PMI.  Heart regular S1/S2, no S3/S4, 3/6 SEM RUSB, can hear S2 clearly.  2+ ankle edema.  No carotid bruit.  Normal pedal pulses.  Abdomen: Soft, nontender, no hepatosplenomegaly, no distention.  Skin: Intact without lesions or rashes.  Neurologic: Alert and oriented x 3.  Psych: Normal affect. Extremities: No clubbing or  cyanosis.  HEENT: Normal.    Recent Labs: 09/18/2020: Magnesium 2.3; TSH 0.568 09/23/2020: B Natriuretic Peptide 342.3 10/13/2020: Hemoglobin 13.2; Platelets 234.0 11/23/2020: ALT 6 12/24/2020: BUN 24; Creatinine, Ser 1.40; Potassium 3.5; Sodium 140  Personally reviewed   Wt Readings from Last 3 Encounters:  12/24/20 73.5 kg (162 lb)  10/12/20 68.5 kg (151 lb)  09/23/20 69.1 kg (152 lb 6.4 oz)    ASSESSMENT AND PLAN:  1. Pulmonary hypertension with RV failure:  Severe PAH with PVR 7 WU on 1/19 RHC.  Etiology uncertain.  She has a history of DVT remotely but V/Q scan in 2/19 did not show evidence for chronic PE. She has OSA and uses CPAP.  Serologic workup showed only positive ANA but when additional serologies were done, they were all negative (see above).  CT chest with no ILD or emphysema.  Valvular heart disease does not appear significant enough to cause this degree of PH.  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  Echo in 4/19 showed that the RV is moderately dilated with mildly decreased systolic function, PA systolic pressure estimation was only 36 mmHg but poor envelope and likely underestimated. She did not tolerate Opsumit or ambrisentan due to significant lower extremity edema and worsening dyspnea.  [place] in 4/20 showed severe pulmonary hypertension with normal filling pressures and preserved cardiac output.  Echo in 4/21 showed normal RV with PASP 50 mmHg, echo today showed normal RV with PASP estimated 41 mmHg. Symptomatically improved though she is volume overloaded on exam.   - Continue Adempas, she is at goal.  Alisha [name] is up to 2400 mcg BID.   2. Chronic diastolic CHF: In setting of pulmonary hypertension and valvular disease.  Weight is up 10 lbs though NYHA class II symptoms (improved from the past).  She is volume overloaded on exam.  She has not been taking metolazone for at least 3 wks.  - She will continue Lasix 80 mg bid.  - She will take metolazone 2.5 x 1 tomorrow and again on Sunday.  After  that, she will take metolazone once weekly on Saturdays.  - BMET today and in 10 days.   3. Syncope: Suspect related to severe pulmonary hypertension.  None recently.   4. Mitral stenosis: Noted on echo and cath.  I reviewed today's echo, mitral stenosis looks moderate. She has a combination of moderate mitral stenosis and moderate aortic stenosis.  With heavy calcification, I do not think that she would be a mitral valvuloplasty candidate.  - I will arrange for a TEE to more closely assess the mitral and aortic valves.  We discussed risks/benefits of procedure and she agrees to it. May need to consider surgery, though she would be relatively high risk with pulmonary hypertension.   5. H/o DVT: No chronic PE on prior V/Q scan.   6. HTN: BP controlled, she is now off losartan.  7. OSA: Continue CPAP qHS.  8. CKD: Stage 3.  BMET today.   9. Aortic stenosis: Moderate on today's echo.  As above, will assess with TEE.  She would be a TAVR candidate if AS severe, but could not percutaneously fix the mitral valve so will likely need to consider surgical MVR/AVR.   10. H/o GI bleeding: Slow chronic GI bleed, source has not been found despite extensive workup.  - CBC today.   Followup in 3 wks with APP.    Signed, Loralie Champagne, MD  12/24/2020  [facility] [address] [facility] [place] [address] [phone] (office) [phone] (fax)

## 2020-12-24 NOTE — H&P (View-Only) (Signed)
ID:  Alisha [name], DOB 21-Apr-1941, MRN [id number]   Provider location: [facility] Type of Visit: Established patient   PCP:  Mackie Pai, PA-C  Cardiologist:  Dr. Aundra Dubin   History of Present Illness: Alisha [name] is a 80 y.o. female who has a history of diabetes, HTN, PAD, hyperlipidemia. She was referred by Dr. Wynonia [name] for evaluation of pulmonary hypertension.   For > 1 year, she has had significant exertional dyspnea. It has been steadily worsening, especially over the last few months.  She has been extensively worked up so far.  Echo in 4/18 showed preserved EF 65% with moderate pulmonary hypertension.  She had episodes of syncope in 5/18 and 10/18.  She wore an event monitor in 5/18 with no significant arrhythmia.  LHC/RHC in 1/19 showed normal PCWP and severely elevated PA pressure, no significant CAD.    At initial appointment in 1/19, she was noted to be hypoxemic with exertion and home oxygen was started for use with exertion.  I also started her on Opsumit. She had to stop this after about a week due to significantly increased exertional peripheral edema.  This resolved after Opsumit was stopped.   Echo in 4/19.  This showed EF 60-65%, mild MS, mild AS, moderately dilated RV with mildly decreased systolic function.   She was found to have Fe deficiency anemia, FOBT+.  EGD showed gastritis and c-scope showed diverticulosis and polyps in 4/19.  She get IV Fe.  She is going to have a capsule endoscopy.   She tried to start ambrisentan, but developed worsening dyspnea and peripheral edema and low oxygen saturation.  This improved after ambrisentan was stopped.   She is now taking riociguat and Uptravi.  RHC in 4/20 showed normal filling pressures but severe pulmonary hypertension with PVR 6 WU.  Echo showed moderately dilated RV with normal systolic function, mild-moderate MS, mild AS.  [name] has now been titrated up to 2400 mcg bid and riociguat  to 1.5 mg tid.    She had COVID-19 PNA in 2/21, hospitalized for about a week.   Echo in 4/21 showed EF 63-84%, grade 2 diastolic dysfunction, normal RV, PASP 50 mmHg, probably mild mitral stenosis with mean gradient 7 mmHg and MVA 2 cm^2 by PHT.  Echo was done today and reviewed, EF 60-65%, moderate LVH, moderate AS with mean gradient 27 mmHg and AVA 1.06 cm^2, moderate mitral stenosis with mean gradient 7 and MVA 1.2 cm^2, normal RV size and systolic function, PASP 41 mmHg.   She returns for followup of CHF and pulmonary hypertension.  Weight is up 10 lbs.  However, she says that she feels the best she has felt in a long time.  She has not taken metolazone in at least 3 wks (forgets).  No dyspnea walking on flat ground or walking up a flight of stairs.  No chest pain or dizziness.  She is generally able to do whatever she wants at this point.     6 minute walk (1/19): 92 m, oxygen saturation dropped to 70%.   6 minute walk (3/19): 122 m 6 minute walk (4/19): 392 m 6 minute walk (9/19): 317 m 6 minute walk (1/20): 320 m 6 minute walk (8/20): 359 m 6 minute walk (10/20): 391 m 6 minute walk (9/21): 335 m, oxygen saturation 90-96% 6 minute walk (1/22): 305 m  Labs (1/19): anti-Jo1 negative, RF borderline positive, SCL-70 negative, HIV negative. ESR 21. BNP 1129.  Labs (  3/19): ANA + => anti-dsDNA negative, SSA/SSB negative, anti-Sm negative, anti-RNP negative. K 3.8 => 3.7, creatinine 1.4 => 1.55.  Labs (4/19): K 4.1 => 3.1, creatinine 1.58 => 1.2, BNP 1902, CCP Ab negative.  Labs (6/19): hgb 9, creatinine 1.66, K 3 Labs (7/19): K 4.9, creatinine 1.7 Labs (8/19): K 3.6, creatinine 1.36 Labs (9/19): K 4.1, creatinine 1.55, BNP 641 Labs (10/19): K 4.7, creatinine 1.88, BNP 595 Labs (1/20): Hgb 12.2 Labs (4/20): K 4.2, creatinine 2.2, hgb 8.4 Labs (7/20): K 3.2, creatinine 2.07, hgb 8 Labs (8/20): K 3.7, creatinine 1.46 Labs (9/20): K 4, creatinine 1.5 Labs (10/20): K 4.5, creatinine  1.75 Labs (2/21): K 4.1, creatinine 1.78 Labs (7/21): K 3.7, creatinine 1.22, hgb 12.2 Labs (1/22): TSH normal, Mg 2.3, creatinine 1.36, K 3, hgb 12.4, BNP 342 Labs (2/22): LDL 66 Labs (3/22): K 3.6, creatinine 1.02  PMH: 1. PAD: Non-healing left ankle ulcer.  - ABIs 9/12 were normal.  2. Type 2 diabetes 3. Hypothyroidism 4. CKD stage 3 5. Hyperlipidemia 6. HTN 7. IBS 8. OSA: on CPAP.  9. Event monitor 5/18: No significant abnormality.  10. DVT in 1995 11. Pulmonary hypertension: Echo (4/18) with EF 65%, mild MR, mild TR, mild mitral stenosis, moderate pulmonary hypertension. - CTA chest 10/17: No PE.  - RHC (1/19): mean RA 11, PA 87/31 mean 51, mean PCWP 6, CI 3.58, PVR 7 WU.  - PFTs (1/19): mild obstruction, some restriction => severely decreased DLCO. - High resolution CT chest (1/19): no interstitial lung disease or emphysema. 7 mm nodule RUL.  - anti-Jo1 negative, RF borderline positive, SCL-70 negative, HIV negative. ANA + => anti-dsDNA negative, SSA/SSB negative, anti-Sm negative, anti-RNP negative. - V/Q scan 2/19: No e/o chronic PE.  - Echo (4/19): EF 60-65%, mild LVH, mild aortic stenosis, mild mitral stenosis, moderately dilated RV with mildly decreased systolic function, PASP 36 mmHg (poor envelope, may be underestimated).  - Does not tolerate ERAs (macitentan, ambrisentan).  - RHC (4/20): mean RA 5, PA 85/22 mean 51, mean PCWP 12, CI  3.5, PVR 6 WU.  - Echo (4/20): EF > 65%, moderate LVH, RV moderately dilated with normal systolic function, moderate pericardial effusion, mild-moderate mitral stenosis with mean gradient 7/MVA by PHT 2.48 cm^2, mild AS mean gradient 12.  - Echo (4/21): EF 80-32%, grade 2 diastolic dysfunction, normal RV, PASP 50 mmHg, probably mild mitral stenosis with mean gradient 7 mmHg and MVA 2 cm^2 by PHT, mild AS.  - Echo (4/22): EF 60-65%, moderate LVH, moderate AS with mean gradient 27 mmHg and AVA 1.06 cm^2, moderate mitral stenosis with mean  gradient 7 and MVA 1.2 cm^2, normal RV size and systolic function, PASP 41 mmHg.  12. Coronary angiography 11/18 without significant disease.  - Coronary angiography in 1/19 with luminal irregularities 13. Mitral stenosis: Mild by cath in 1/19, mean gradient 2.6 mmHg. Mild on 4/19 echo.  - Mild-moderate by echo in 4/20.  - Mild by echo in 4/21.  - Moderate by echo in 4/22.  14. Aortic stenosis: Mild on 4/19 echo.  - Mild on 4/20 echo.  - Mild on 4/21 echo.  - Moderate on 4/22 echo.  15. Fe deficiency anemia: EGD showed gastritis and c-scope showed diverticulosis and polyps in 4/19. Capsule endoscopy was unrevealing.  16. Pulmonary nodule: CT chest 7/19 with RUL nodule.  17. Low back pain/sciatica 18. COVID-19 PNA in 2/21.   Current Outpatient Medications  Medication Sig Dispense Refill  . amLODipine (NORVASC) 5 MG  tablet Take 1 tablet (5 mg total) by mouth daily. 90 tablet 3  . atorvastatin (LIPITOR) 20 MG tablet Take 1 tablet (20 mg total) by mouth every morning. 30 tablet 2  . cetirizine (ZYRTEC) 10 MG tablet Take 10 mg by mouth daily as needed for allergies.    . fenofibrate 160 MG tablet Take 1 tablet (160 mg total) by mouth every morning. 30 tablet 2  . ferrous sulfate 325 (65 FE) MG tablet Take 1 tablet (325 mg total) by mouth daily with breakfast. Morning and night 30 tablet 0  . furosemide (LASIX) 80 MG tablet Take 1 tablet (80 mg total) by mouth 2 (two) times daily. 60 tablet 2  . glimepiride (AMARYL) 4 MG tablet Take 1 tablet (4 mg total) by mouth daily with breakfast. 30 tablet 2  . levothyroxine (SYNTHROID) 125 MCG tablet Take 1 tablet (125 mcg total) by mouth daily before breakfast. 30 tablet 2  . loperamide (IMODIUM A-D) 2 MG tablet Take 2 mg by mouth 4 (four) times daily as needed for diarrhea or loose stools.    Marland Kitchen loratadine (CLARITIN) 10 MG tablet Take 10 mg by mouth daily as needed for allergies.     . metFORMIN (GLUCOPHAGE) 500 MG tablet Take 1 tablet (500 mg total) by  mouth 2 (two) times daily with a meal. 180 tablet 1  . metolazone (ZAROXOLYN) 2.5 MG tablet Take 1 tablet (2.5 mg total) by mouth once a week. Take potassium 29mq with this 4 tablet 6  . metoprolol succinate (TOPROL XL) 25 MG 24 hr tablet Take 1 tablet (25 mg total) by mouth 2 (two) times daily. 60 tablet 11  . Multiple Vitamins-Minerals (PRESERVISION AREDS 2 PO) Take 1 capsule by mouth 2 (two) times daily.    . pantoprazole (PROTONIX) 40 MG tablet Take 40 mg by mouth 2 (two) times daily.    . potassium chloride (KLOR-CON) 10 MEQ tablet TAKE 1 TABLET (10 MEQ TOTAL) BY MOUTH DAILY. TAKE AN EXTRA 20MEQ (2 TABLETS) EVERY WEEK WITH METOLAZONE 114 tablet 1  . Riociguat 2.5 MG TABS Take 2.5 mg by mouth 3 (three) times daily. 270 tablet 3  . SELEXIPAG PO Take 2,400 mcg by mouth 2 (two) times daily.    . traMADol (ULTRAM) 50 MG tablet Take 50 mg by mouth 2 (two) times daily as needed.     No current facility-administered medications for this encounter.    Allergies:   Other and Adhesive [tape]   Social History:  The patient  reports that she has never smoked. She has never used smokeless tobacco. She reports that she does not drink alcohol and does not use drugs.   Family History:  The patient's family history is not on file.   ROS:  Please see the history of present illness.   All other systems are personally reviewed and negative.   Exam:   BP (!) 142/60   Pulse 75   Wt 73.5 kg (162 lb)   SpO2 98%   BMI 26.96 kg/m  General: NAD Neck: JVP 10 cm, no thyromegaly or thyroid nodule.  Lungs: Clear to auscultation bilaterally with normal respiratory effort. CV: Nondisplaced PMI.  Heart regular S1/S2, no S3/S4, 3/6 SEM RUSB, can hear S2 clearly.  2+ ankle edema.  No carotid bruit.  Normal pedal pulses.  Abdomen: Soft, nontender, no hepatosplenomegaly, no distention.  Skin: Intact without lesions or rashes.  Neurologic: Alert and oriented x 3.  Psych: Normal affect. Extremities: No clubbing or  cyanosis.  HEENT: Normal.    Recent Labs: 09/18/2020: Magnesium 2.3; TSH 0.568 09/23/2020: B Natriuretic Peptide 342.3 10/13/2020: Hemoglobin 13.2; Platelets 234.0 11/23/2020: ALT 6 12/24/2020: BUN 24; Creatinine, Ser 1.40; Potassium 3.5; Sodium 140  Personally reviewed   Wt Readings from Last 3 Encounters:  12/24/20 73.5 kg (162 lb)  10/12/20 68.5 kg (151 lb)  09/23/20 69.1 kg (152 lb 6.4 oz)    ASSESSMENT AND PLAN:  1. Pulmonary hypertension with RV failure:  Severe PAH with PVR 7 WU on 1/19 RHC.  Etiology uncertain.  She has a history of DVT remotely but V/Q scan in 2/19 did not show evidence for chronic PE. She has OSA and uses CPAP.  Serologic workup showed only positive ANA but when additional serologies were done, they were all negative (see above).  CT chest with no ILD or emphysema.  Valvular heart disease does not appear significant enough to cause this degree of PH.  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  Echo in 4/19 showed that the RV is moderately dilated with mildly decreased systolic function, PA systolic pressure estimation was only 36 mmHg but poor envelope and likely underestimated. She did not tolerate Opsumit or ambrisentan due to significant lower extremity edema and worsening dyspnea.  [place] in 4/20 showed severe pulmonary hypertension with normal filling pressures and preserved cardiac output.  Echo in 4/21 showed normal RV with PASP 50 mmHg, echo today showed normal RV with PASP estimated 41 mmHg. Symptomatically improved though she is volume overloaded on exam.   - Continue Adempas, she is at goal.  [name] is up to 2400 mcg BID.   2. Chronic diastolic CHF: In setting of pulmonary hypertension and valvular disease.  Weight is up 10 lbs though NYHA class II symptoms (improved from the past).  She is volume overloaded on exam.  She has not been taking metolazone for at least 3 wks.  - She will continue Lasix 80 mg bid.  - She will take metolazone 2.5 x 1 tomorrow and again on Sunday.  After  that, she will take metolazone once weekly on Saturdays.  - BMET today and in 10 days.   3. Syncope: Suspect related to severe pulmonary hypertension.  None recently.   4. Mitral stenosis: Noted on echo and cath.  I reviewed today's echo, mitral stenosis looks moderate. She has a combination of moderate mitral stenosis and moderate aortic stenosis.  With heavy calcification, I do not think that she would be a mitral valvuloplasty candidate.  - I will arrange for a TEE to more closely assess the mitral and aortic valves.  We discussed risks/benefits of procedure and she agrees to it. May need to consider surgery, though she would be relatively high risk with pulmonary hypertension.   5. H/o DVT: No chronic PE on prior V/Q scan.   6. HTN: BP controlled, she is now off losartan.  7. OSA: Continue CPAP qHS.  8. CKD: Stage 3.  BMET today.   9. Aortic stenosis: Moderate on today's echo.  As above, will assess with TEE.  She would be a TAVR candidate if AS severe, but could not percutaneously fix the mitral valve so will likely need to consider surgical MVR/AVR.   10. H/o GI bleeding: Slow chronic GI bleed, source has not been found despite extensive workup.  - CBC today.   Followup in 3 wks with APP.    Signed, Loralie Champagne, MD  12/24/2020  [facility] [address] [facility] [place] [address] [phone] (office) [phone] (fax)

## 2021-01-05 ENCOUNTER — Other Ambulatory Visit (HOSPITAL_COMMUNITY)
Admission: RE | Admit: 2021-01-05 | Discharge: 2021-01-05 | Disposition: A | Discharge status: Home / Self Care | Payer: Medicare Other | Source: Ambulatory Visit | Attending: Cardiology | Admitting: Cardiology

## 2021-01-05 DIAGNOSIS — Z01812 Encounter for preprocedural laboratory examination: Secondary | ICD-10-CM | POA: Insufficient documentation

## 2021-01-05 DIAGNOSIS — Z20822 Contact with and (suspected) exposure to covid-19: Secondary | ICD-10-CM | POA: Insufficient documentation

## 2021-01-06 LAB — SARS CORONAVIRUS 2 (TAT 6-24 HRS): SARS Coronavirus 2: NEGATIVE

## 2021-01-07 ENCOUNTER — Ambulatory Visit (HOSPITAL_BASED_OUTPATIENT_CLINIC_OR_DEPARTMENT_OTHER)
Admission: RE | Admit: 2021-01-07 | Discharge: 2021-01-07 | Disposition: A | Discharge status: Home / Self Care | Payer: Medicare Other | Source: Ambulatory Visit | Attending: Cardiology | Admitting: Cardiology

## 2021-01-07 ENCOUNTER — Other Ambulatory Visit: Payer: Self-pay

## 2021-01-07 ENCOUNTER — Ambulatory Visit (HOSPITAL_COMMUNITY): Payer: Medicare Other | Admitting: Anesthesiology

## 2021-01-07 ENCOUNTER — Encounter (HOSPITAL_COMMUNITY)
Admission: RE | Disposition: A | Discharge status: Home / Self Care | Payer: Self-pay | Source: Home / Self Care | Attending: Cardiology

## 2021-01-07 ENCOUNTER — Ambulatory Visit (HOSPITAL_COMMUNITY)
Admission: RE | Admit: 2021-01-07 | Discharge: 2021-01-07 | Disposition: A | Discharge status: Home / Self Care | Payer: Medicare Other | Attending: Cardiology | Admitting: Cardiology

## 2021-01-07 ENCOUNTER — Encounter (HOSPITAL_COMMUNITY): Payer: Self-pay | Admitting: Cardiology

## 2021-01-07 DIAGNOSIS — I050 Rheumatic mitral stenosis: Secondary | ICD-10-CM

## 2021-01-07 DIAGNOSIS — R55 Syncope and collapse: Secondary | ICD-10-CM | POA: Diagnosis not present

## 2021-01-07 DIAGNOSIS — Z91048 Other nonmedicinal substance allergy status: Secondary | ICD-10-CM | POA: Insufficient documentation

## 2021-01-07 DIAGNOSIS — Z86718 Personal history of other venous thrombosis and embolism: Secondary | ICD-10-CM | POA: Diagnosis not present

## 2021-01-07 DIAGNOSIS — Z79899 Other long term (current) drug therapy: Secondary | ICD-10-CM | POA: Diagnosis not present

## 2021-01-07 DIAGNOSIS — I351 Nonrheumatic aortic (valve) insufficiency: Secondary | ICD-10-CM | POA: Diagnosis not present

## 2021-01-07 DIAGNOSIS — I350 Nonrheumatic aortic (valve) stenosis: Secondary | ICD-10-CM

## 2021-01-07 DIAGNOSIS — I5032 Chronic diastolic (congestive) heart failure: Secondary | ICD-10-CM | POA: Diagnosis not present

## 2021-01-07 DIAGNOSIS — I083 Combined rheumatic disorders of mitral, aortic and tricuspid valves: Secondary | ICD-10-CM | POA: Insufficient documentation

## 2021-01-07 DIAGNOSIS — Z79890 Hormone replacement therapy (postmenopausal): Secondary | ICD-10-CM | POA: Insufficient documentation

## 2021-01-07 DIAGNOSIS — E785 Hyperlipidemia, unspecified: Secondary | ICD-10-CM | POA: Insufficient documentation

## 2021-01-07 DIAGNOSIS — Z7984 Long term (current) use of oral hypoglycemic drugs: Secondary | ICD-10-CM | POA: Diagnosis not present

## 2021-01-07 DIAGNOSIS — G4733 Obstructive sleep apnea (adult) (pediatric): Secondary | ICD-10-CM | POA: Insufficient documentation

## 2021-01-07 DIAGNOSIS — Z9981 Dependence on supplemental oxygen: Secondary | ICD-10-CM | POA: Insufficient documentation

## 2021-01-07 DIAGNOSIS — I130 Hypertensive heart and chronic kidney disease with heart failure and stage 1 through stage 4 chronic kidney disease, or unspecified chronic kidney disease: Secondary | ICD-10-CM | POA: Insufficient documentation

## 2021-01-07 DIAGNOSIS — E1151 Type 2 diabetes mellitus with diabetic peripheral angiopathy without gangrene: Secondary | ICD-10-CM | POA: Insufficient documentation

## 2021-01-07 DIAGNOSIS — D509 Iron deficiency anemia, unspecified: Secondary | ICD-10-CM | POA: Insufficient documentation

## 2021-01-07 DIAGNOSIS — I342 Nonrheumatic mitral (valve) stenosis: Secondary | ICD-10-CM | POA: Diagnosis not present

## 2021-01-07 DIAGNOSIS — I272 Pulmonary hypertension, unspecified: Secondary | ICD-10-CM | POA: Diagnosis not present

## 2021-01-07 DIAGNOSIS — I340 Nonrheumatic mitral (valve) insufficiency: Secondary | ICD-10-CM

## 2021-01-07 DIAGNOSIS — Z8616 Personal history of COVID-19: Secondary | ICD-10-CM | POA: Insufficient documentation

## 2021-01-07 DIAGNOSIS — N183 Chronic kidney disease, stage 3 unspecified: Secondary | ICD-10-CM | POA: Diagnosis not present

## 2021-01-07 HISTORY — PX: TEE WITHOUT CARDIOVERSION: SHX5443

## 2021-01-07 LAB — GLUCOSE, CAPILLARY: Glucose-Capillary: 105 mg/dL — ABNORMAL HIGH (ref 70–99)

## 2021-01-07 LAB — ECHO TEE
AR max vel: 1.35 cm2
AV Area VTI: 1.31 cm2
AV Area mean vel: 1.27 cm2
AV Mean grad: 26 mmHg
AV Peak grad: 35.9 mmHg
Ao pk vel: 3 m/s
Area-P 1/2: 2.53 cm2
MV VTI: 1.84 cm2

## 2021-01-07 SURGERY — ECHOCARDIOGRAM, TRANSESOPHAGEAL
Anesthesia: Monitor Anesthesia Care

## 2021-01-07 MED ORDER — PHENYLEPHRINE HCL-NACL 10-0.9 MG/250ML-% IV SOLN
INTRAVENOUS | Status: DC | PRN
  Administered 2021-01-07: 20 ug/min via INTRAVENOUS

## 2021-01-07 MED ORDER — BUTAMBEN-TETRACAINE-BENZOCAINE 2-2-14 % EX AERO
INHALATION_SPRAY | CUTANEOUS | Status: DC | PRN
  Administered 2021-01-07: 2 via TOPICAL

## 2021-01-07 MED ORDER — PROPOFOL 500 MG/50ML IV EMUL
INTRAVENOUS | Status: DC | PRN
  Administered 2021-01-07: 80 ug/kg/min via INTRAVENOUS

## 2021-01-07 MED ORDER — LIDOCAINE 2% (20 MG/ML) 5 ML SYRINGE
INTRAMUSCULAR | Status: DC | PRN
  Administered 2021-01-07: 60 mg via INTRAVENOUS

## 2021-01-07 MED ORDER — SODIUM CHLORIDE 0.9 % IV SOLN: INTRAVENOUS | Status: DC

## 2021-01-07 MED ORDER — PROPOFOL 10 MG/ML IV BOLUS
INTRAVENOUS | Status: DC | PRN
  Administered 2021-01-07: 10 mg via INTRAVENOUS
  Administered 2021-01-07: 15 mg via INTRAVENOUS

## 2021-01-07 NOTE — Transfer of Care (Signed)
Immediate Anesthesia Transfer of Care Note  Patient: Alisha [name]  Procedure(s) Performed: TRANSESOPHAGEAL ECHOCARDIOGRAM (TEE) (N/A )  Patient Location: Endoscopy Unit  Anesthesia Type:MAC  Level of Consciousness: awake, drowsy and patient cooperative  Airway & Oxygen Therapy: Patient Spontanous Breathing  Post-op Assessment: Report given to RN, Post -op Vital signs reviewed and stable and Patient moving all extremities  Post vital signs: Reviewed and stable  Last Vitals:  Vitals Value Taken Time  BP 98/43 01/07/21 1045  Temp    Pulse 68 01/07/21 1046  Resp 21 01/07/21 1046  SpO2 95 % 01/07/21 1046  Vitals shown include unvalidated device data.  Last Pain:  Vitals:   01/07/21 0907  TempSrc: Oral  PainSc: 0-No pain         Complications: No complications documented.

## 2021-01-07 NOTE — Discharge Instructions (Signed)

## 2021-01-07 NOTE — Interval H&P Note (Signed)
History and Physical Interval Note:  01/07/2021 10:08 AM  Alisha [name]  has presented today for surgery, with the diagnosis of MITRAL VALVE STENOSIS.  The various methods of treatment have been discussed with the patient and family. After consideration of risks, benefits and other options for treatment, the patient has consented to  Procedure(s): TRANSESOPHAGEAL ECHOCARDIOGRAM (TEE) (N/A) as a surgical intervention.  The patient's history has been reviewed, patient examined, no change in status, stable for surgery.  I have reviewed the patient's chart and labs.  Questions were answered to the patient's satisfaction.     Love[place] Like [employer]

## 2021-01-07 NOTE — Anesthesia Preprocedure Evaluation (Addendum)
Anesthesia Evaluation  Patient identified by MRN, date of birth, ID band Patient awake    Reviewed: Allergy & Precautions, NPO status , Patient's Chart, lab work & pertinent test results, reviewed documented beta blocker date and time   Airway Mallampati: II  TM Distance: >3 FB Neck ROM: Full    Dental  (+) Poor Dentition, Missing, Dental Advisory Given,    Pulmonary neg pulmonary ROS, neg shortness of breath,    Pulmonary exam normal breath sounds clear to auscultation       Cardiovascular METS: 5 - 7 Mets hypertension, Pt. on home beta blockers and Pt. on medications + CAD, + Peripheral Vascular Disease and +CHF (grade 2 diastolic dysfunction, severe pulmonary HTN)  Normal cardiovascular exam+ Valvular Problems/Murmurs (moderate MS, moderate AS) AS  Rhythm:Regular Rate:Normal  Echo 12/24/20: 1. Left ventricular ejection fraction, by estimation, is 60 to 65%. The  left ventricle has normal function. The left ventricle has no regional  wall motion abnormalities. There is moderate left ventricular hypertrophy.  Left ventricular diastolic  parameters are consistent with Grade II diastolic dysfunction  (pseudonormalization).  2. Right ventricular systolic function is normal. The right ventricular  size is normal. There is mildly elevated pulmonary artery systolic  pressure. The estimated right ventricular systolic pressure is 71.1 mmHg.  3. Left atrial size was severely dilated.  4. Right atrial size was mildly dilated.  5. The mitral valve is degenerative. Trivial mitral valve regurgitation.  Moderate mitral stenosis, mean gradient 7 mmHg with MVA by VTI 1.2 cm^2.  6. The aortic valve is tricuspid. Aortic valve regurgitation is trivial.  Moderate aortic valve stenosis. Aortic valve area, by VTI measures 1.06  cm. Aortic valve mean gradient measures 27.0 mmHg.  7. The inferior vena cava is normal in size with <50% respiratory   variability, suggesting right atrial pressure of 8 mmHg.   Cath 2020: 1. Normal filling pressures.  2. Severe pulmonary arterial hypertension.  3. Preserved cardiac output.  RV pressure 86   Neuro/Psych negative neurological ROS  negative psych ROS   GI/Hepatic Neg liver ROS, GERD  Medicated and Controlled,  Endo/Other  diabetes, Well Controlled, Type 2, Oral Hypoglycemic AgentsHypothyroidism a1c 7.0  Renal/GU Renal InsufficiencyRenal diseaseCr 1.4  negative genitourinary   Musculoskeletal negative musculoskeletal ROS (+)   Abdominal   Peds  Hematology negative hematology ROS (+)   Anesthesia Other Findings   Reproductive/Obstetrics negative OB ROS                            Anesthesia Physical Anesthesia Plan  ASA: IV  Anesthesia Plan: MAC   Post-op Pain Management:    Induction:   PONV Risk Score and Plan: 2 and Propofol infusion and TIVA  Airway Management Planned: Natural Airway and Simple Face Mask  Additional Equipment: None  Intra-op Plan:   Post-operative Plan:   Informed Consent: I have reviewed the patients History and Physical, chart, labs and discussed the procedure including the risks, benefits and alternatives for the proposed anesthesia with the patient or authorized representative who has indicated his/her understanding and acceptance.       Plan Discussed with: CRNA  Anesthesia Plan Comments:        Anesthesia Quick Evaluation

## 2021-01-07 NOTE — Progress Notes (Signed)
  Echocardiogram Echocardiogram Transesophageal has been performed.  Alisha [name] 01/07/2021, 11:01 AM

## 2021-01-07 NOTE — Anesthesia Procedure Notes (Addendum)
Procedure Name: MAC Date/Time: 01/07/2021 10:22 AM Performed by: Rande Brunt, CRNA Pre-anesthesia Checklist: Patient identified, Emergency Drugs available, Suction available and Patient being monitored Patient Re-evaluated:Patient Re-evaluated prior to induction Oxygen Delivery Method: Nasal cannula Preoxygenation: Pre-oxygenation with 100% oxygen Induction Type: IV induction Placement Confirmation: positive ETCO2 and CO2 detector

## 2021-01-07 NOTE — Anesthesia Postprocedure Evaluation (Signed)
Anesthesia Post Note  Patient: Alisha [name]  Procedure(s) Performed: TRANSESOPHAGEAL ECHOCARDIOGRAM (TEE) (N/A )     Patient location during evaluation: PACU Anesthesia Type: MAC Level of consciousness: awake and alert Pain management: pain level controlled Vital Signs Assessment: post-procedure vital signs reviewed and stable Respiratory status: spontaneous breathing, nonlabored ventilation and respiratory function stable Cardiovascular status: blood pressure returned to baseline and stable Postop Assessment: no apparent nausea or vomiting Anesthetic complications: no   No complications documented.  Last Vitals:  Vitals:   01/07/21 1054 01/07/21 1106  BP: (!) 118/43 (!) 107/47  Pulse:    Resp:    Temp:    SpO2:      Last Pain:  Vitals:   01/07/21 1106  TempSrc:   PainSc: 0-No pain                 Pervis Hocking

## 2021-01-08 ENCOUNTER — Encounter (HOSPITAL_COMMUNITY): Payer: Self-pay | Admitting: Cardiology

## 2021-01-11 ENCOUNTER — Encounter: Payer: Self-pay | Admitting: Medical

## 2021-01-11 ENCOUNTER — Other Ambulatory Visit: Payer: Self-pay | Admitting: Medical

## 2021-01-11 ENCOUNTER — Other Ambulatory Visit: Payer: Self-pay

## 2021-01-11 ENCOUNTER — Ambulatory Visit (INDEPENDENT_AMBULATORY_CARE_PROVIDER_SITE_OTHER): Payer: Medicare Other | Admitting: Medical

## 2021-01-11 VITALS — BP 108/60 | HR 93 | Resp 18 | Ht 65.0 in | Wt 149.4 lb

## 2021-01-11 DIAGNOSIS — E039 Hypothyroidism, unspecified: Secondary | ICD-10-CM

## 2021-01-11 DIAGNOSIS — I10 Essential (primary) hypertension: Secondary | ICD-10-CM

## 2021-01-11 DIAGNOSIS — I272 Pulmonary hypertension, unspecified: Secondary | ICD-10-CM

## 2021-01-11 DIAGNOSIS — I5032 Chronic diastolic (congestive) heart failure: Secondary | ICD-10-CM

## 2021-01-11 DIAGNOSIS — R944 Abnormal results of kidney function studies: Secondary | ICD-10-CM

## 2021-01-11 DIAGNOSIS — I350 Nonrheumatic aortic (valve) stenosis: Secondary | ICD-10-CM

## 2021-01-11 DIAGNOSIS — E1151 Type 2 diabetes mellitus with diabetic peripheral angiopathy without gangrene: Secondary | ICD-10-CM | POA: Diagnosis not present

## 2021-01-11 NOTE — Patient Instructions (Signed)
History of CHF and pulmonary hypertension.  I reviewed recent echo done by a cardiologist and reviewed last notes from cardiologist.  Clinically stable presently.  Continue current treatment advised by cardiologist.  History of decreased kidney function.  Will place future CMP to follow kidney function.  History of anemia but clinically stable.  No severe fatigue as you described in the past.  You are seeing hematologist this week to for repeat CBC to specialist.  History of hypothyroidism.  We will add future TSH to labs.   History of DVT continue on Xarelto.  For diabetes, placing future A1c and advised continue metformin and Amaryl.  Make dose adjustments if necessary.  Recommend continuing low sugar diet.  Follow-up approximately 3 months after future lab visit or sooner if needed.

## 2021-01-11 NOTE — Progress Notes (Signed)
Subjective:    Patient ID: Alisha [name], female    DOB: October 21, 1940, 80 y.o.   MRN: [id number]  HPI  Pt in for follow up.   Pt has htn, high cholesterol, diabetes and low thyroid.  Pt had recent TEE. Mitral valve and aortic valve disease.  Left ventricular ejection fraction, by estimation, is 60 to 65%. The left ventricle has normal function. The left ventricle has no regional wall motion abnormalities. There is mild left ventricular hypertrophy. 2. Mildly D-shaped interventricular septum suggestive of a degree of RV pressure/volume overload. Right ventricular systolic function is normal. The right ventricular size is mildly enlarged. Peak RV-RA gradient 38 mmHg. 3. Left atrial size was moderately dilated. No left atrial/left atrial appendage thrombus was detected. 4. No PFO or ASD by color doppler. 5. The aortic valve is tricuspid. Aortic valve regurgitation is mild. Moderate aortic valve stenosis. Aortic valve area, by VTI measures 1.31 cm. Aortic valve mean gradient measures 26.0 mmHg. 6. The mitral valve is degenerative. Mild to moderate mitral valve regurgitation. Mild to moderate mitral stenosis, mean gradient 7 mmHg with MVA 1.7 cm^2 by VTI (2.5 cm^2 by PHT). Moderate mitral annular and valvular calcification with restriction of the leaflets. 7. Normal caliber thoracic aorta with grade 3 plaque in descending thoracic aorta.  History of chf. January bnp was not elevated. CT chest in Feb did not show chf flare.  Pt had hx of dvt in pat. She is on xarelto.  Pt back around January had tachycariadia. Work up done and rate is was elevated with ectopy. Pt give b blocker.  Pt states feels better presently compared to in past.   Hx of anemia in past. Pt has hx of GI bleed. Had procedure done to stop. Following up with hematologist this afternoon.  Pt sugar this morning was 100. Pt is on amaryl and metformin.  Review of Systems  Constitutional: Positive for fatigue.  Negative for chills and fever.       Mild nothing like before when had severe anemia.  HENT: Negative for congestion, ear pain, sinus pressure and sneezing.   Respiratory: Negative for cough, chest tightness, shortness of breath and wheezing.   Cardiovascular: Negative for chest pain and palpitations.  Gastrointestinal: Negative for abdominal pain, blood in stool and vomiting.  Genitourinary: Negative for dysuria.  Musculoskeletal: Negative for back pain, joint swelling and neck stiffness.  Neurological: Negative for dizziness, seizures, speech difficulty, weakness and headaches.  Hematological: Negative for adenopathy. Does not bruise/bleed easily.  Psychiatric/Behavioral: Negative for behavioral problems.    Past Medical History:  Diagnosis Date  . Aortic atherosclerosis ([place]) 08/30/2017  . Aortic stenosis 08/30/2017  . CAD (coronary artery disease), native coronary artery 08/30/2017  . CHF (congestive heart failure) ([place])   . Chronic diastolic heart failure ([place]) 08/30/2017  . Diabetes mellitus with peripheral vascular disease ([place]) 07/14/2014  . Hypertensive heart disease without CHF 08/30/2017  . Hypothyroidism (acquired) 08/30/2017  . Pulmonary hypertension ([place]) 07/04/2017     Social History   Socioeconomic History  . Marital status: Divorced    Spouse name: Not on file  . Number of children: Not on file  . Years of education: Not on file  . Highest education level: Not on file  Occupational History  . Not on file  Tobacco Use  . Smoking status: Never Smoker  . Smokeless tobacco: Never Used  Vaping Use  . Vaping Use: Never used  Substance and Sexual Activity  . Alcohol use: No  .  Drug use: No  . Sexual activity: Never  Other Topics Concern  . Not on file  Social History Narrative  . Not on file   Social Determinants of Health   Financial Resource Strain: Not on file  Food Insecurity: Not on file  Transportation Needs: Not on file  Physical Activity: Not on  file  Stress: Not on file  Social Connections: Not on file  Intimate Partner Violence: Not on file    Past Surgical History:  Procedure Laterality Date  . ANKLE SURGERY    . APPENDECTOMY    . DILATION AND CURETTAGE OF UTERUS    . RIGHT HEART CATH N/A 01/01/2019   Procedure: RIGHT HEART CATH;  Surgeon: Larey Dresser, MD;  Location: [facility];  Service: Cardiovascular;  Laterality: N/A;  . RIGHT/LEFT HEART CATH AND CORONARY ANGIOGRAPHY N/A 09/27/2017   Procedure: RIGHT/LEFT HEART CATH AND CORONARY ANGIOGRAPHY;  Surgeon: Sherren Mocha, MD;  Location: [place] [facility];  Service: Cardiovascular;  Laterality: N/A;  . TEE WITHOUT CARDIOVERSION N/A 01/07/2021   Procedure: TRANSESOPHAGEAL ECHOCARDIOGRAM (TEE);  Surgeon: Larey Dresser, MD;  Location: [facility] ENDOSCOPY;  Service: Cardiovascular;  Laterality: N/A;    No family history on file.  Allergies  Allergen Reactions  . Other Palpitations    Surgical metal (stents, pins, plates) keeps pt from healing  . Adhesive [Tape] Other (See Comments)    Irritates skin with rash  . Nickel Rash    Current Outpatient Medications on File Prior to Visit  Medication Sig Dispense Refill  . amLODipine (NORVASC) 5 MG tablet Take 1 tablet (5 mg total) by mouth daily. 90 tablet 3  . atorvastatin (LIPITOR) 20 MG tablet Take 1 tablet (20 mg total) by mouth every morning. 30 tablet 2  . cetirizine (ZYRTEC) 10 MG tablet Take 10 mg by mouth daily as needed for allergies.    . fenofibrate 160 MG tablet Take 1 tablet (160 mg total) by mouth every morning. 30 tablet 2  . loperamide (IMODIUM A-D) 2 MG tablet Take 2 mg by mouth daily as needed for diarrhea or loose stools.    Marland Kitchen loratadine (CLARITIN) 10 MG tablet Take 10 mg by mouth daily as needed for allergies.     . metFORMIN (GLUCOPHAGE) 500 MG tablet Take 1 tablet (500 mg total) by mouth 2 (two) times daily with a meal. 180 tablet 1  . metolazone (ZAROXOLYN) 2.5 MG tablet Take 1 tablet (2.5 mg  total) by mouth once a week. Take potassium 22meq with this 4 tablet 6  . metoprolol succinate (TOPROL XL) 25 MG 24 hr tablet Take 1 tablet (25 mg total) by mouth 2 (two) times daily. 60 tablet 11  . Multiple Vitamins-Minerals (PRESERVISION AREDS 2 PO) Take 1 capsule by mouth 2 (two) times daily.    . pantoprazole (PROTONIX) 40 MG tablet Take 40 mg by mouth 2 (two) times daily.    . potassium chloride (KLOR-CON) 10 MEQ tablet TAKE 1 TABLET (10 MEQ TOTAL) BY MOUTH DAILY. TAKE AN EXTRA 20MEQ (2 TABLETS) EVERY WEEK WITH METOLAZONE (Patient taking differently: Take 10 mEq by mouth daily.) 114 tablet 1  . Riociguat 2.5 MG TABS Take 2.5 mg by mouth 3 (three) times daily. (Patient taking differently: Take 2.5 mg by mouth 3 (three) times daily. Adempas) 270 tablet 3  . traMADol (ULTRAM) 50 MG tablet Take 50 mg by mouth every 6 (six) hours as needed for moderate pain.    Marland Kitchen UPTRAVI 1600 MCG TABS  Take 1,600 mcg by mouth 2 (two) times daily. Take with 800 for a total of 2400 mg    . UPTRAVI 800 MCG TABS Take 800 mcg by mouth 2 (two) times daily. Take with 1600 mcg for a total of 2400 mg    . ferrous sulfate 325 (65 FE) MG tablet Take 1 tablet (325 mg total) by mouth daily with breakfast. Morning and night (Patient taking differently: Take 325 mg by mouth daily with breakfast.) 30 tablet 0  . furosemide (LASIX) 80 MG tablet Take 1 tablet (80 mg total) by mouth 2 (two) times daily. 60 tablet 2  . glimepiride (AMARYL) 4 MG tablet Take 1 tablet (4 mg total) by mouth daily with breakfast. 30 tablet 2  . levothyroxine (SYNTHROID) 125 MCG tablet Take 1 tablet (125 mcg total) by mouth daily before breakfast. 30 tablet 2   No current facility-administered medications on file prior to visit.    BP 104/78   Pulse 93   Resp 18   Ht 5\' 5"  (1.651 m)   Wt 149 lb 6.4 oz (67.8 kg)   SpO2 90%   BMI 24.86 kg/m      Objective:   Physical Exam  General Mental Status- Alert. General Appearance- Not in acute distress.    Skin General: Color- Normal Color. Moisture- Normal Moisture.  Neck Carotid Arteries- Normal color. Moisture- Normal Moisture. No carotid bruits. No JVD.  Chest and Lung Exam Auscultation: Breath Sounds:-Normal.  Cardiovascular Auscultation:Rythm- Regular. Murmurs & Other Heart Sounds:Auscultation of the heart reveals- No Murmurs.  Abdomen Inspection:-Inspeection Normal. Palpation/Percussion:Note:No mass. Palpation and Percussion of the abdomen reveal- Non Tender, Non Distended + BS, no rebound or guarding.    Neurologic Cranial Nerve exam:- CN III-XII intact(No nystagmus), symmetric smile. Drift Test:- No drift. Romberg Exam:- Negative.  Heal to Toe Gait exam:-Normal. Finger to Nose:- Normal/Intact Strength:- 5/5 equal and symmetric strength both upper and lower extremities.  Lower- ext- no pedal edema. symmetric.      Assessment & Plan:  History of CHF and pulmonary hypertension.  I reviewed recent echo done by a cardiologist and reviewed last notes from cardiologist.  Clinically stable presently.  Continue current treatment advised by cardiologist.  History of decreased kidney function.  Will place future CMP to follow kidney function.  History of anemia but clinically stable.  No severe fatigue as you described in the past.  You are seeing hematologist this week to for repeat CBC to specialist.  History of hypothyroidism.  We will add future TSH to labs.   History of DVT continue on Xarelto.  For diabetes, placing future A1c and advised continue metformin and Amaryl.  Make dose adjustments if necessary.  Recommend continuing low sugar diet.  Follow-up approximately 3 months after future lab visit or sooner if needed.  Mackie Pai, PA-C   Time spent with patient today was 32  minutes which consisted of chart revdiew, discussing diagnosis, work up treatment and documentation.

## 2021-01-19 ENCOUNTER — Ambulatory Visit (HOSPITAL_COMMUNITY)
Admission: RE | Admit: 2021-01-19 | Discharge: 2021-01-19 | Disposition: A | Discharge status: Home / Self Care | Payer: Medicare Other | Source: Ambulatory Visit | Attending: Cardiology | Admitting: Cardiology

## 2021-01-19 ENCOUNTER — Other Ambulatory Visit: Payer: Self-pay

## 2021-01-19 ENCOUNTER — Other Ambulatory Visit (HOSPITAL_COMMUNITY): Payer: Self-pay

## 2021-01-19 ENCOUNTER — Encounter (HOSPITAL_COMMUNITY): Payer: Self-pay

## 2021-01-19 VITALS — BP 138/58 | HR 78 | Wt 153.8 lb

## 2021-01-19 DIAGNOSIS — I272 Pulmonary hypertension, unspecified: Secondary | ICD-10-CM | POA: Insufficient documentation

## 2021-01-19 DIAGNOSIS — Z7984 Long term (current) use of oral hypoglycemic drugs: Secondary | ICD-10-CM | POA: Insufficient documentation

## 2021-01-19 DIAGNOSIS — Z86010 Personal history of colonic polyps: Secondary | ICD-10-CM | POA: Diagnosis not present

## 2021-01-19 DIAGNOSIS — I080 Rheumatic disorders of both mitral and aortic valves: Secondary | ICD-10-CM | POA: Insufficient documentation

## 2021-01-19 DIAGNOSIS — Z9989 Dependence on other enabling machines and devices: Secondary | ICD-10-CM | POA: Diagnosis not present

## 2021-01-19 DIAGNOSIS — N183 Chronic kidney disease, stage 3 unspecified: Secondary | ICD-10-CM | POA: Diagnosis not present

## 2021-01-19 DIAGNOSIS — I2510 Atherosclerotic heart disease of native coronary artery without angina pectoris: Secondary | ICD-10-CM | POA: Diagnosis not present

## 2021-01-19 DIAGNOSIS — G4733 Obstructive sleep apnea (adult) (pediatric): Secondary | ICD-10-CM | POA: Insufficient documentation

## 2021-01-19 DIAGNOSIS — Z8616 Personal history of COVID-19: Secondary | ICD-10-CM | POA: Diagnosis not present

## 2021-01-19 DIAGNOSIS — R55 Syncope and collapse: Secondary | ICD-10-CM | POA: Insufficient documentation

## 2021-01-19 DIAGNOSIS — Z888 Allergy status to other drugs, medicaments and biological substances status: Secondary | ICD-10-CM | POA: Diagnosis not present

## 2021-01-19 DIAGNOSIS — Z79899 Other long term (current) drug therapy: Secondary | ICD-10-CM | POA: Insufficient documentation

## 2021-01-19 DIAGNOSIS — Z79890 Hormone replacement therapy (postmenopausal): Secondary | ICD-10-CM | POA: Insufficient documentation

## 2021-01-19 DIAGNOSIS — E1122 Type 2 diabetes mellitus with diabetic chronic kidney disease: Secondary | ICD-10-CM | POA: Insufficient documentation

## 2021-01-19 DIAGNOSIS — E1151 Type 2 diabetes mellitus with diabetic peripheral angiopathy without gangrene: Secondary | ICD-10-CM | POA: Diagnosis not present

## 2021-01-19 DIAGNOSIS — I5032 Chronic diastolic (congestive) heart failure: Secondary | ICD-10-CM | POA: Insufficient documentation

## 2021-01-19 DIAGNOSIS — Z86718 Personal history of other venous thrombosis and embolism: Secondary | ICD-10-CM | POA: Insufficient documentation

## 2021-01-19 DIAGNOSIS — I130 Hypertensive heart and chronic kidney disease with heart failure and stage 1 through stage 4 chronic kidney disease, or unspecified chronic kidney disease: Secondary | ICD-10-CM | POA: Diagnosis not present

## 2021-01-19 DIAGNOSIS — Z8719 Personal history of other diseases of the digestive system: Secondary | ICD-10-CM | POA: Insufficient documentation

## 2021-01-19 LAB — BASIC METABOLIC PANEL
Anion gap: 9 (ref 5–15)
BUN: 74 mg/dL — ABNORMAL HIGH (ref 8–23)
CO2: 26 mmol/L (ref 22–32)
Calcium: 9.2 mg/dL (ref 8.9–10.3)
Chloride: 101 mmol/L (ref 98–111)
Creatinine, Ser: 1.93 mg/dL — ABNORMAL HIGH (ref 0.44–1.00)
GFR, Estimated: 26 mL/min — ABNORMAL LOW (ref >60)
Glucose, Bld: 182 mg/dL — ABNORMAL HIGH (ref 70–99)
Potassium: 3.6 mmol/L (ref 3.5–5.1)
Sodium: 136 mmol/L (ref 135–145)

## 2021-01-19 MED ORDER — RIOCIGUAT 2.5 MG PO TABS: 2.5000 mg | ORAL_TABLET | Freq: Three times a day (TID) | ORAL | 0 refills | Status: DC

## 2021-01-19 NOTE — Progress Notes (Signed)
ID:  Alisha [name], DOB 07/08/1941, MRN [id number]   Provider location: [place] [facility] Type of Visit: Established patient   PCP:  Mackie Pai, PA-C  Cardiologist:  Dr. Aundra Dubin   History of Present Illness: Alisha [name] is a 80 y.o. female who has a history of diabetes, HTN, PAD, hyperlipidemia. She was referred by Dr. Wynonia Lawman for evaluation of pulmonary hypertension.   For > 1 year, she has had significant exertional dyspnea. It has been steadily worsening, especially over the last few months.  She has been extensively worked up so far.  Echo in 4/18 showed preserved EF 65% with moderate pulmonary hypertension.  She had episodes of syncope in 5/18 and 10/18.  She wore an event monitor in 5/18 with no significant arrhythmia.  LHC/RHC in 1/19 showed normal PCWP and severely elevated PA pressure, no significant CAD.    At initial appointment in 1/19, she was noted to be hypoxemic with exertion and home oxygen was started for use with exertion.  I also started her on Opsumit. She had to stop this after about a week due to significantly increased exertional peripheral edema.  This resolved after Opsumit was stopped.   Echo in 4/19.  This showed EF 60-65%, mild MS, mild AS, moderately dilated RV with mildly decreased systolic function.   She was found to have Fe deficiency anemia, FOBT+.  EGD showed gastritis and c-scope showed diverticulosis and polyps in 4/19.  She get IV Fe.  She is going to have a capsule endoscopy.   She tried to start ambrisentan, but developed worsening dyspnea and peripheral edema and low oxygen saturation.  This improved after ambrisentan was stopped.   She is now taking riociguat and Uptravi.  RHC in 4/20 showed normal filling pressures but severe pulmonary hypertension with PVR 6 WU.  Echo showed moderately dilated RV with normal systolic function, mild-moderate MS, mild AS.  Alisha [name] has now been titrated up to 2400 mcg bid and riociguat  to 1.5 mg tid.    She had COVID-19 PNA in 2/21, hospitalized for about a week.   Echo in 4/21 showed EF 99-35%, grade 2 diastolic dysfunction, normal RV, PASP 50 mmHg, probably mild mitral stenosis with mean gradient 7 mmHg and MVA 2 cm^2 by PHT.    Recently seen by Dr. Aundra Dubin 12/24/20 and wt was up 10 lb w/ evidence of fluid overload on exam, but no significant increase in dyspnea. This was in setting of poor compliance w/ metolazone. 2D echo was completed. LVEF normal at 60-65%, G2DD, RV normal with PASP estimated 41 mmHg. Her mitral stenosis appeared more stenosed, read as moderate. The aortic valve was also noted to be moderately stenosed. She was instructed to take 2 doses of metolazone that week then return to once weekly thereafter, qSaturdays. TEE was also arranged to better assess her mitral and aortic valves.  TEE on 4/28 showed moderate aortic valve stenosis. Aortic valve area, by VTI measures 1.31 cm. Aortic valve mean gradient measures 26.0 mmHg. The mitral valve is degenerative with mild-mod MR and mild-mod mitral stenosis, mean gradient 7 mmHg with MVA 1.7 cm^2 by VTI (2.5 cm^2 by PHT). Moderate mitral annular and valvular calcification with restriction of the leaflets. Continued surveillance recommended.   She returns to clinic today for volume assessment. She has improved compliance w/ metolazone. Wt down 9 lb. Doing well symptomatically. NYHA Class II. No LEE. She report recent gout flare in left knee but resolved w/ medication.  No other complaints. BP well controlled.   6 minute walk (1/19): 92 m, oxygen saturation dropped to 70%.   6 minute walk (3/19): 122 m 6 minute walk (4/19): 392 m 6 minute walk (9/19): 317 m 6 minute walk (1/20): 320 m 6 minute walk (8/20): 359 m 6 minute walk (10/20): 391 m 6 minute walk (9/21): 335 m, oxygen saturation 90-96% 6 minute walk (1/22): 305 m  Labs (1/19): anti-Jo1 negative, RF borderline positive, SCL-70 negative, HIV negative. ESR 21.  BNP 1129.  Labs (3/19): ANA + => anti-dsDNA negative, SSA/SSB negative, anti-Sm negative, anti-RNP negative. K 3.8 => 3.7, creatinine 1.4 => 1.55.  Labs (4/19): K 4.1 => 3.1, creatinine 1.58 => 1.2, BNP 1902, CCP Ab negative.  Labs (6/19): hgb 9, creatinine 1.66, K 3 Labs (7/19): K 4.9, creatinine 1.7 Labs (8/19): K 3.6, creatinine 1.36 Labs (9/19): K 4.1, creatinine 1.55, BNP 641 Labs (10/19): K 4.7, creatinine 1.88, BNP 595 Labs (1/20): Hgb 12.2 Labs (4/20): K 4.2, creatinine 2.2, hgb 8.4 Labs (7/20): K 3.2, creatinine 2.07, hgb 8 Labs (8/20): K 3.7, creatinine 1.46 Labs (9/20): K 4, creatinine 1.5 Labs (10/20): K 4.5, creatinine 1.75 Labs (2/21): K 4.1, creatinine 1.78 Labs (7/21): K 3.7, creatinine 1.22, hgb 12.2 Labs (1/22): TSH normal, Mg 2.3, creatinine 1.36, K 3, hgb 12.4, BNP 342 Labs (2/22): LDL 66 Labs (3/22): K 3.6, creatinine 1.02 Labs(4/22): K creatinine 1.40, K 3.5   PMH: 1. PAD: Non-healing left ankle ulcer.  - ABIs 9/12 were normal.  2. Type 2 diabetes 3. Hypothyroidism 4. CKD stage 3 5. Hyperlipidemia 6. HTN 7. IBS 8. OSA: on CPAP.  9. Event monitor 5/18: No significant abnormality.  10. DVT in 1995 11. Pulmonary hypertension: Echo (4/18) with EF 65%, mild MR, mild TR, mild mitral stenosis, moderate pulmonary hypertension. - CTA chest 10/17: No PE.  - RHC (1/19): mean RA 11, PA 87/31 mean 51, mean PCWP 6, CI 3.58, PVR 7 WU.  - PFTs (1/19): mild obstruction, some restriction => severely decreased DLCO. - High resolution CT chest (1/19): no interstitial lung disease or emphysema. 7 mm nodule RUL.  - anti-Jo1 negative, RF borderline positive, SCL-70 negative, HIV negative. ANA + => anti-dsDNA negative, SSA/SSB negative, anti-Sm negative, anti-RNP negative. - V/Q scan 2/19: No e/o chronic PE.  - Echo (4/19): EF 60-65%, mild LVH, mild aortic stenosis, mild mitral stenosis, moderately dilated RV with mildly decreased systolic function, PASP 36 mmHg (poor envelope,  may be underestimated).  - Does not tolerate ERAs (macitentan, ambrisentan).  - RHC (4/20): mean RA 5, PA 85/22 mean 51, mean PCWP 12, CI  3.5, PVR 6 WU.  - Echo (4/20): EF > 65%, moderate LVH, RV moderately dilated with normal systolic function, moderate pericardial effusion, mild-moderate mitral stenosis with mean gradient 7/MVA by PHT 2.48 cm^2, mild AS mean gradient 12.  - Echo (4/21): EF 96-28%, grade 2 diastolic dysfunction, normal RV, PASP 50 mmHg, probably mild mitral stenosis with mean gradient 7 mmHg and MVA 2 cm^2 by PHT, mild AS.  - Echo (4/22): EF 60-65%, moderate LVH, moderate AS with mean gradient 27 mmHg and AVA 1.06 cm^2, moderate mitral stenosis with mean gradient 7 and MVA 1.2 cm^2, normal RV size and systolic function, PASP 41 mmHg.  12. Coronary angiography 11/18 without significant disease.  - Coronary angiography in 1/19 with luminal irregularities 13. Mitral stenosis: Mild by cath in 1/19, mean gradient 2.6 mmHg. Mild on 4/19 echo.  - Mild-moderate by echo in  4/20.  - Mild by echo in 4/21.  - Moderate by echo in 4/22.  14. Aortic stenosis: Mild on 4/19 echo.  - Mild on 4/20 echo.  - Mild on 4/21 echo.  - Moderate on 4/22 echo.  15. Fe deficiency anemia: EGD showed gastritis and c-scope showed diverticulosis and polyps in 4/19. Capsule endoscopy was unrevealing.  16. Pulmonary nodule: CT chest 7/19 with RUL nodule.  17. Low back pain/sciatica 18. COVID-19 PNA in 2/21.   Current Outpatient Medications  Medication Sig Dispense Refill  . amLODipine (NORVASC) 5 MG tablet Take 1 tablet (5 mg total) by mouth daily. 90 tablet 3  . atorvastatin (LIPITOR) 20 MG tablet TAKE 1 TABLET BY MOUTH IN  THE MORNING 90 tablet 3  . cetirizine (ZYRTEC) 10 MG tablet Take 10 mg by mouth daily as needed for allergies.    . fenofibrate 160 MG tablet Take 1 tablet (160 mg total) by mouth every morning. 30 tablet 2  . ferrous sulfate 325 (65 FE) MG tablet Take 1 tablet (325 mg total) by mouth  daily with breakfast. Morning and night 30 tablet 0  . furosemide (LASIX) 80 MG tablet Take 1 tablet (80 mg total) by mouth 2 (two) times daily. 60 tablet 2  . glimepiride (AMARYL) 4 MG tablet Take 1 tablet (4 mg total) by mouth daily with breakfast. 30 tablet 2  . levothyroxine (SYNTHROID) 125 MCG tablet Take 1 tablet (125 mcg total) by mouth daily before breakfast. 30 tablet 2  . loperamide (IMODIUM A-D) 2 MG tablet Take 2 mg by mouth daily as needed for diarrhea or loose stools.    Marland Kitchen loratadine (CLARITIN) 10 MG tablet Take 10 mg by mouth daily as needed for allergies.     . metFORMIN (GLUCOPHAGE) 500 MG tablet Take 1 tablet (500 mg total) by mouth 2 (two) times daily with a meal. 180 tablet 1  . metolazone (ZAROXOLYN) 2.5 MG tablet Take 1 tablet (2.5 mg total) by mouth once a week. Take potassium 70mq with this 4 tablet 6  . metoprolol succinate (TOPROL XL) 25 MG 24 hr tablet Take 1 tablet (25 mg total) by mouth 2 (two) times daily. 60 tablet 11  . Multiple Vitamins-Minerals (PRESERVISION AREDS 2 PO) Take 1 capsule by mouth 2 (two) times daily.    . pantoprazole (PROTONIX) 40 MG tablet Take 40 mg by mouth 2 (two) times daily.    . potassium chloride (KLOR-CON) 10 MEQ tablet TAKE 1 TABLET (10 MEQ TOTAL) BY MOUTH DAILY. TAKE AN EXTRA 20MEQ (2 TABLETS) EVERY WEEK WITH METOLAZONE (Patient taking differently: Take 10 mEq by mouth daily.) 114 tablet 1  . Riociguat 2.5 MG TABS Take 2.5 mg by mouth 3 (three) times daily. (Patient taking differently: Take 2.5 mg by mouth 3 (three) times daily. Adempas) 270 tablet 3  . UPTRAVI 1600 MCG TABS Take 1,600 mcg by mouth 2 (two) times daily. Take with 800 for a total of 2400 mg    . UPTRAVI 800 MCG TABS Take 800 mcg by mouth 2 (two) times daily. Take with 1600 mcg for a total of 2400 mg    . traMADol (ULTRAM) 50 MG tablet Take 50 mg by mouth every 6 (six) hours as needed for moderate pain. (Patient not taking: Reported on 01/19/2021)     No current  facility-administered medications for this encounter.    Allergies:   Other, Adhesive [tape], and Nickel   Social History:  The patient  reports that she has  never smoked. She has never used smokeless tobacco. She reports that she does not drink alcohol and does not use drugs.   Family History:  The patient's family history is not on file.   ROS:  Please see the history of present illness.   All other systems are personally reviewed and negative.   Exam:   BP (!) 138/58   Pulse 78   Wt 69.8 kg   SpO2 98%   BMI 25.59 kg/m   PHYSICAL EXAM: General:  Well appearing. No respiratory difficulty HEENT: normal Neck: supple. no JVD. Carotids 2+ bilat; no bruits. No lymphadenopathy or thyromegaly appreciated. Cor: PMI nondisplaced. Regular rate & rhythm. 3/6 SEM RUSB Lungs: clear Abdomen: soft, nontender, nondistended. No hepatosplenomegaly. No bruits or masses. Good bowel sounds. Extremities: no cyanosis, clubbing, rash, edema Neuro: alert & oriented x 3, cranial nerves grossly intact. moves all 4 extremities w/o difficulty. Affect pleasant.   Recent Labs: 09/18/2020: Magnesium 2.3; TSH 0.568 09/23/2020: B Natriuretic Peptide 342.3 10/13/2020: Hemoglobin 13.2; Platelets 234.0 11/23/2020: ALT 6 12/24/2020: BUN 24; Creatinine, Ser 1.40; Potassium 3.5; Sodium 140  Personally reviewed   Wt Readings from Last 3 Encounters:  01/19/21 69.8 kg  01/11/21 67.8 kg  01/07/21 68 kg    ASSESSMENT AND PLAN:  1. Pulmonary hypertension with RV failure:  Severe PAH with PVR 7 WU on 1/19 RHC.  Etiology uncertain.  She has a history of DVT remotely but V/Q scan in 2/19 did not show evidence for chronic PE. She has OSA and uses CPAP.  Serologic workup showed only positive ANA but when additional serologies were done, they were all negative (see above).  CT chest with no ILD or emphysema.  Valvular heart disease does not appear significant enough to cause this degree of PH.  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  Echo in 4/19  showed that the RV is moderately dilated with mildly decreased systolic function, PA systolic pressure estimation was only 36 mmHg but poor envelope and likely underestimated. She did not tolerate Opsumit or ambrisentan due to significant lower extremity edema and worsening dyspnea.  [place] in 4/20 showed severe pulmonary hypertension with normal filling pressures and preserved cardiac output.  Echo in 4/21 showed normal RV with PASP 50 mmHg, Echo 4/22 showed normal LVEF and normal RV with PASP estimated 41 mmHg. Volume status stable on exam. NYHA Class II - Continue Adempas, she is at goal.  Alisha [name] is up to 2400 mcg BID.   2. Chronic diastolic CHF: In setting of pulmonary hypertension and valvular disease. Recent echo 4/22 w/ normal LVEF 60-65%, G2DD. RV normal.  Wt back down and now euvolemic w/ diuretic adjustments. NYHA class II symptoms (improved from the past).  Discussed importance of strict compliance w/ diuretics w/ daily Lasix and once weekly metolazone.  - She will continue Lasix 80 mg bid.  - Continue 2.5 metolazone once weekly on Saturdays.  - Check BMP today  3. Syncope: Suspect related to severe pulmonary hypertension.  None recently.   4. Mitral stenosis: Noted on echo and cath. TEE 4/21 showed the mitral valve is degenerative with mild-mod MR and mild-mod mitral stenosis, mean gradient 7 mmHg with MVA 1.7 cm^2 by VTI (2.5 cm^2 by PHT).  With heavy calcification, I do not think that she would be a mitral valvuloplasty candidate.  5. H/o DVT: No chronic PE on prior V/Q scan.   6. HTN: BP controlled, she is now off losartan.  7. OSA: Continue CPAP qHS.  8. CKD:  Stage 3.  BMET today.   9. Aortic stenosis: Moderate on TTE 4/22. TEE c/w mild-mod AS.  Aortic valve area, by VTI measures 1.31 cm. Aortic valve mean gradient measures 26.0 mmHg 10. H/o GI bleeding: Slow chronic GI bleed, source has not been found despite extensive workup.  - recent CBC 01/11/21 w/ stable hgb, 13.1  Followup in  3 months   Signed, Lyda Jester, PA-C  01/19/2021  [place] [id number] [phone] (office) [phone] (fax)

## 2021-01-19 NOTE — Patient Instructions (Addendum)
It was great to see you today! No medication changes are needed at this time.  Labs today We will only contact you if something comes back abnormal or we need to make some changes. Otherwise no news is good news!  Your physician recommends that you schedule a follow-up appointment in: 3-4 months with Dr McLean  Do the following things EVERYDAY: 1) Weigh yourself in the morning before breakfast. Write it down and keep it in a log. 2) Take your medicines as prescribed 3) Eat low salt foods--Limit salt (sodium) to 2000 mg per day.  4) Stay as active as you can everyday 5) Limit all fluids for the day to less than 2 liters  At the [facility], you and your health needs are our priority. As part of our continuing mission to provide you with exceptional heart care, we have created designated Provider Care Teams. These Care Teams include your primary Cardiologist (physician) and Advanced Practice Providers (APPs- Physician Assistants and Nurse Practitioners) who all work together to provide you with the care you need, when you need it.   You may see any of the following providers on your designated Care Team at your next follow up: . Dr Daniel Bensimhon . Dr Dalton McLean . Dr Brandon Winfrey . Amy Clegg, NP . Brittainy Simmons, PA . Jessica Milford,NP . Lauren Kemp, PharmD   Please be sure to bring in all your medications bottles to every appointment.   If you have any questions or concerns before your next appointment please send us a message through mychart or call our office at [phone].    TO LEAVE A MESSAGE FOR THE NURSE SELECT OPTION 2, PLEASE LEAVE A MESSAGE INCLUDING: . YOUR NAME . DATE OF BIRTH . CALL BACK NUMBER . REASON FOR CALL**this is important as we prioritize the call backs  YOU WILL RECEIVE A CALL BACK THE SAME DAY AS LONG AS YOU CALL BEFORE 4:00 PM   

## 2021-01-21 ENCOUNTER — Other Ambulatory Visit: Payer: Self-pay

## 2021-01-21 ENCOUNTER — Other Ambulatory Visit (INDEPENDENT_AMBULATORY_CARE_PROVIDER_SITE_OTHER): Payer: Medicare Other

## 2021-01-21 DIAGNOSIS — E1151 Type 2 diabetes mellitus with diabetic peripheral angiopathy without gangrene: Secondary | ICD-10-CM

## 2021-01-21 DIAGNOSIS — I10 Essential (primary) hypertension: Secondary | ICD-10-CM

## 2021-01-21 NOTE — Addendum Note (Signed)
Addended by: CREFT, Kristine Garbe L on: 01/21/2021 10:20 AM   Modules accepted: Orders

## 2021-01-21 NOTE — Addendum Note (Signed)
Addended by: CREFT, Kristine Garbe L on: 01/21/2021 10:29 AM   Modules accepted: Orders

## 2021-01-22 ENCOUNTER — Other Ambulatory Visit (INDEPENDENT_AMBULATORY_CARE_PROVIDER_SITE_OTHER): Payer: Medicare Other

## 2021-01-22 ENCOUNTER — Other Ambulatory Visit (HOSPITAL_COMMUNITY): Payer: Self-pay

## 2021-01-22 DIAGNOSIS — I10 Essential (primary) hypertension: Secondary | ICD-10-CM | POA: Diagnosis not present

## 2021-01-22 DIAGNOSIS — E1151 Type 2 diabetes mellitus with diabetic peripheral angiopathy without gangrene: Secondary | ICD-10-CM

## 2021-01-22 LAB — COMPREHENSIVE METABOLIC PANEL
ALT: 7 U/L (ref 0–35)
AST: 16 U/L (ref 0–37)
Albumin: 4.1 g/dL (ref 3.5–5.2)
Alkaline Phosphatase: 46 U/L (ref 39–117)
BUN: 61 mg/dL — ABNORMAL HIGH (ref 6–23)
CO2: 30 mEq/L (ref 19–32)
Calcium: 9.4 mg/dL (ref 8.4–10.5)
Chloride: 100 mEq/L (ref 96–112)
Creatinine, Ser: 1.71 mg/dL — ABNORMAL HIGH (ref 0.40–1.20)
GFR: 28.36 mL/min — ABNORMAL LOW (ref >60.00)
Glucose, Bld: 60 mg/dL — ABNORMAL LOW (ref 70–99)
Potassium: 3.7 mEq/L (ref 3.5–5.1)
Sodium: 141 mEq/L (ref 135–145)
Total Bilirubin: 0.4 mg/dL (ref 0.2–1.2)
Total Protein: 7.3 g/dL (ref 6.0–8.3)

## 2021-01-22 LAB — LIPID PANEL
Cholesterol: 124 mg/dL (ref 0–200)
HDL: 58.6 mg/dL (ref >39.00)
LDL Cholesterol: 51 mg/dL (ref 0–99)
NonHDL: 65.33
Total CHOL/HDL Ratio: 2
Triglycerides: 74 mg/dL (ref 0.0–149.0)
VLDL: 14.8 mg/dL (ref 0.0–40.0)

## 2021-01-22 LAB — HEMOGLOBIN A1C: Hgb A1c MFr Bld: 7 % — ABNORMAL HIGH (ref 4.6–6.5)

## 2021-01-22 MED ORDER — UPTRAVI 1600 MCG PO TABS: 1600.0000 ug | ORAL_TABLET | Freq: Two times a day (BID) | ORAL | 11 refills | Status: AC

## 2021-01-22 MED ORDER — UPTRAVI 800 MCG PO TABS: 800.0000 ug | ORAL_TABLET | Freq: Two times a day (BID) | ORAL | 11 refills | Status: AC

## 2021-01-23 ENCOUNTER — Encounter: Payer: Self-pay | Admitting: Medical

## 2021-01-23 ENCOUNTER — Telehealth: Payer: Self-pay | Admitting: Medical

## 2021-01-23 DIAGNOSIS — R944 Abnormal results of kidney function studies: Secondary | ICD-10-CM

## 2021-01-23 NOTE — Telephone Encounter (Signed)
Future cmp placed. 

## 2021-01-25 ENCOUNTER — Telehealth: Payer: Self-pay | Admitting: Medical

## 2021-01-25 DIAGNOSIS — I5032 Chronic diastolic (congestive) heart failure: Secondary | ICD-10-CM

## 2021-01-25 NOTE — Telephone Encounter (Signed)
Future bnp and chest xray placed.

## 2021-01-26 ENCOUNTER — Other Ambulatory Visit: Payer: Self-pay

## 2021-01-26 ENCOUNTER — Other Ambulatory Visit (INDEPENDENT_AMBULATORY_CARE_PROVIDER_SITE_OTHER): Payer: Medicare Other

## 2021-01-26 ENCOUNTER — Ambulatory Visit (HOSPITAL_BASED_OUTPATIENT_CLINIC_OR_DEPARTMENT_OTHER)
Admission: RE | Admit: 2021-01-26 | Discharge: 2021-01-26 | Disposition: A | Discharge status: Home / Self Care | Payer: Medicare Other | Source: Ambulatory Visit | Attending: Medical | Admitting: Medical

## 2021-01-26 DIAGNOSIS — I5032 Chronic diastolic (congestive) heart failure: Secondary | ICD-10-CM | POA: Diagnosis present

## 2021-01-26 DIAGNOSIS — R944 Abnormal results of kidney function studies: Secondary | ICD-10-CM

## 2021-01-26 LAB — COMPREHENSIVE METABOLIC PANEL
ALT: 9 U/L (ref 0–35)
AST: 17 U/L (ref 0–37)
Albumin: 4.1 g/dL (ref 3.5–5.2)
Alkaline Phosphatase: 46 U/L (ref 39–117)
BUN: 49 mg/dL — ABNORMAL HIGH (ref 6–23)
CO2: 27 mEq/L (ref 19–32)
Calcium: 9.5 mg/dL (ref 8.4–10.5)
Chloride: 99 mEq/L (ref 96–112)
Creatinine, Ser: 1.62 mg/dL — ABNORMAL HIGH (ref 0.40–1.20)
GFR: 30.26 mL/min — ABNORMAL LOW (ref >60.00)
Glucose, Bld: 121 mg/dL — ABNORMAL HIGH (ref 70–99)
Potassium: 4.4 mEq/L (ref 3.5–5.1)
Sodium: 137 mEq/L (ref 135–145)
Total Bilirubin: 0.4 mg/dL (ref 0.2–1.2)
Total Protein: 7.1 g/dL (ref 6.0–8.3)

## 2021-01-26 LAB — BRAIN NATRIURETIC PEPTIDE: Pro B Natriuretic peptide (BNP): 823 pg/mL — ABNORMAL HIGH (ref 0.0–100.0)

## 2021-01-28 ENCOUNTER — Telehealth: Payer: Self-pay | Admitting: Medical

## 2021-01-28 DIAGNOSIS — N189 Chronic kidney disease, unspecified: Secondary | ICD-10-CM

## 2021-01-28 DIAGNOSIS — R944 Abnormal results of kidney function studies: Secondary | ICD-10-CM

## 2021-01-28 DIAGNOSIS — R918 Other nonspecific abnormal finding of lung field: Secondary | ICD-10-CM

## 2021-01-28 NOTE — Telephone Encounter (Signed)
Pulmonary nodule hx. Repeat ct chest order placed. Can you get prior authorization. Does she need prior auth?

## 2021-01-28 NOTE — Telephone Encounter (Signed)
Referral to nephrologist placed. 

## 2021-02-04 ENCOUNTER — Other Ambulatory Visit: Payer: Self-pay

## 2021-02-04 ENCOUNTER — Ambulatory Visit (INDEPENDENT_AMBULATORY_CARE_PROVIDER_SITE_OTHER): Payer: Medicare Other | Admitting: Medical

## 2021-02-04 ENCOUNTER — Encounter: Payer: Self-pay | Admitting: Medical

## 2021-02-04 VITALS — BP 108/60 | HR 78 | Temp 97.7°F | Resp 16 | Ht 65.0 in | Wt 158.0 lb

## 2021-02-04 DIAGNOSIS — I10 Essential (primary) hypertension: Secondary | ICD-10-CM | POA: Diagnosis not present

## 2021-02-04 DIAGNOSIS — N189 Chronic kidney disease, unspecified: Secondary | ICD-10-CM | POA: Diagnosis not present

## 2021-02-04 DIAGNOSIS — R944 Abnormal results of kidney function studies: Secondary | ICD-10-CM

## 2021-02-04 DIAGNOSIS — I5032 Chronic diastolic (congestive) heart failure: Secondary | ICD-10-CM

## 2021-02-04 MED ORDER — PANTOPRAZOLE SODIUM 40 MG PO TBEC: 40.0000 mg | DELAYED_RELEASE_TABLET | Freq: Two times a day (BID) | ORAL | 2 refills | Status: DC

## 2021-02-04 MED ORDER — GLIMEPIRIDE 4 MG PO TABS
4.0000 mg | ORAL_TABLET | Freq: Every day | ORAL | 2 refills | Status: DC
Start: 2021-02-04 — End: 2021-05-26

## 2021-02-04 NOTE — Patient Instructions (Addendum)
History of CHF and clinically well controlled on current diuretic regimen.  Recent chest x-ray look stable and BNP mild elevated.  Continue to follow-up with cardiologist as regular scheduled/in August.  History of CKD and GFR recently in 30 range.  That is a good idea to go ahead and get you established with nephrologist.  Jim Desanctis to hear that you have appointment in June.  History of diabetes with last A1c 7.0.  You do describe that kitchen where you live offers a lot of carbohydrates.  Recommend that you continue with glimepiride 4 mg daily and can take metformin 500 mg just once daily presently.  If you watch carb intake I think your sugars can stay under control.  Dose reduction of metformin might be beneficial for kidney function.  Check sugars 1-2 times daily and if your sugars are trending above 180 please let me know.  Follow-up week of August 22 or sooner if needed.  We will repeat CMP and A1c on appointment date.

## 2021-02-04 NOTE — Progress Notes (Signed)
Subjective:    Patient ID: Alisha [name], female    DOB: 09-11-1941, 80 y.o.   MRN: [id number]  HPI Pt in for follow up.  Pt states doing well. Pt has random slight swelling to both calfs. Pt states she did not take zaroxyln this Saturday. She takes lasix 80 mg twice a day.  Pt has appointment with nephrologist March 11, 2021. Pt sees cardiologist.   A/P note below. ASSESSMENT AND PLAN:  1. 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 Etiology uncertain. She has a history of DVT remotely but V/Q scan in 2/19 did not show evidence for chronic PE. She has OSA and uses CPAP. Serologic workup showed only positive ANA but when additional serologies were done, they were all negative (see above). CT chest with no ILD or emphysema. Valvular heart disease does not appear significant enough to cause this degree of PH. 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 Echo in 4/19 showed that the RV is moderately dilated with mildly decreased systolic function, PA systolic pressure estimation was only 36 mmHg but poor envelope and likely underestimated. She did not tolerate Opsumit or ambrisentan due to significant lower extremity edema and worsening dyspnea. [place] in 4/20 showed severe pulmonary hypertension with normal filling pressures and preserved cardiac output.  Echo in 4/21 showed normal RV with PASP 50 mmHg, echo today showed normal RV with PASP estimated 41 mmHg. Symptomatically improved though she is volume overloaded on exam.   - Continue Adempas, she is at goal.  [name] is up to 2400 mcg BID.   2. Chronic diastolic CHF: In setting of pulmonary hypertension and valvular disease.  Weight is up 10 lbs though NYHA class II symptoms (improved from the past).  She is volume overloaded on exam.  She has not been taking metolazone for at least 3 wks.  - She will continue Lasix 80 mg bid.  - She will take metolazone 2.5 x 1 tomorrow and again on Sunday.  After that, she will take  metolazone once weekly on Saturdays.  - BMET today and in 10 days.   3. Syncope: Suspect related to severe pulmonary hypertension. None recently.  4. Mitral stenosis: Noted on echo and cath.  I reviewed today's echo, mitral stenosis looks moderate. She has a combination of moderate mitral stenosis and moderate aortic stenosis.  With heavy calcification, I do not think that she would be a mitral valvuloplasty candidate.  - I will arrange for a TEE to more closely assess the mitral and aortic valves.  We discussed risks/benefits of procedure and she agrees to it. May need to consider surgery, though she would be relatively high risk with pulmonary hypertension.   5. H/o DVT: No chronic PE on prior V/Q scan.  6. HTN: BP controlled, she is now off losartan.  7. OSA: Continue CPAP qHS.  8. CKD: Stage 3. BMET today.  9. Aortic stenosis: Moderate on today's echo.  As above, will assess with TEE.  She would be a TAVR candidate if AS severe, but could not percutaneously fix the mitral valve so will likely need to consider surgical MVR/AVR.   10. H/o GI bleeding: Slow chronic GI bleed, source has not been found despite extensive workup.  - CBC today.   No orthopnea. No shortness of breath.   IMPRESSION: Mild cardiomegaly with minimal diffuse interstitial opacity, similar to prior examination, likely mild edema and/or chronic underlying interstitial change. No new or focal airspace opacity.  Pt is diabetic. Her last a1c was 7.0. Pt when she does check sugars 90-120. Pt stats she lives at [name]. In dining room they serve a lot of carbs. Pt is on metformin.  Decreased gfr. No use of nsaids.    Review of Systems  Constitutional: Negative for chills, fatigue and fever.  Respiratory: Negative for cough, chest tightness, shortness of breath and wheezing.   Cardiovascular: Negative for chest pain and palpitations.  Gastrointestinal: Negative for abdominal pain, constipation, nausea and  vomiting.  Genitourinary: Negative for dysuria.  Musculoskeletal: Negative for back pain and myalgias.  Skin: Negative for rash.  Neurological: Negative for dizziness, speech difficulty, numbness and headaches.  Hematological: Negative for adenopathy. Does not bruise/bleed easily.  Psychiatric/Behavioral: Negative for behavioral problems, confusion, dysphoric mood, sleep disturbance and suicidal ideas. The patient is not nervous/anxious.     Past Medical History:  Diagnosis Date  . Aortic atherosclerosis ([place]) 08/30/2017  . Aortic stenosis 08/30/2017  . CAD (coronary artery disease), native coronary artery 08/30/2017  . CHF (congestive heart failure) ([place])   . Chronic diastolic heart failure ([name]) 08/30/2017  . Diabetes mellitus with peripheral vascular disease ([place]) 07/14/2014  . Hypertensive heart disease without CHF 08/30/2017  . Hypothyroidism (acquired) 08/30/2017  . Pulmonary hypertension ([place]) 07/04/2017     Social History   Socioeconomic History  . Marital status: Divorced    Spouse name: Not on file  . Number of children: Not on file  . Years of education: Not on file  . Highest education level: Not on file  Occupational History  . Not on file  Tobacco Use  . Smoking status: Never Smoker  . Smokeless tobacco: Never Used  Vaping Use  . Vaping Use: Never used  Substance and Sexual Activity  . Alcohol use: No  . Drug use: No  . Sexual activity: Never  Other Topics Concern  . Not on file  Social History Narrative  . Not on file   Social Determinants of Health   Financial Resource Strain: Not on file  Food Insecurity: Not on file  Transportation Needs: Not on file  Physical Activity: Not on file  Stress: Not on file  Social Connections: Not on file  Intimate Partner Violence: Not on file    Past Surgical History:  Procedure Laterality Date  . ANKLE SURGERY    . APPENDECTOMY    . DILATION AND CURETTAGE OF UTERUS    . RIGHT HEART CATH N/A 01/01/2019    Procedure: RIGHT HEART CATH;  Surgeon: Larey Dresser, MD;  Location: [facility];  Service: Cardiovascular;  Laterality: N/A;  . RIGHT/LEFT HEART CATH AND CORONARY ANGIOGRAPHY N/A 09/27/2017   Procedure: RIGHT/LEFT HEART CATH AND CORONARY ANGIOGRAPHY;  Surgeon: Sherren Mocha, MD;  Location: [facility];  Service: Cardiovascular;  Laterality: N/A;  . TEE WITHOUT CARDIOVERSION N/A 01/07/2021   Procedure: TRANSESOPHAGEAL ECHOCARDIOGRAM (TEE);  Surgeon: Larey Dresser, MD;  Location: [facility] ENDOSCOPY;  Service: Cardiovascular;  Laterality: N/A;    No family history on file.  Allergies  Allergen Reactions  . Other Palpitations    Surgical metal (stents, pins, plates) keeps pt from healing  . Adhesive [Tape] Other (See Comments)    Irritates skin with rash  . Nickel Rash    Current Outpatient Medications on File Prior to Visit  Medication Sig Dispense Refill  . amLODipine (NORVASC) 5 MG tablet Take 1 tablet (5 mg total) by mouth daily. 90 tablet 3  . atorvastatin (LIPITOR) 20  MG tablet TAKE 1 TABLET BY MOUTH IN  THE MORNING 90 tablet 3  . cetirizine (ZYRTEC) 10 MG tablet Take 10 mg by mouth daily as needed for allergies.    . fenofibrate 160 MG tablet Take 1 tablet (160 mg total) by mouth every morning. 30 tablet 2  . loperamide (IMODIUM A-D) 2 MG tablet Take 2 mg by mouth daily as needed for diarrhea or loose stools.    Marland Kitchen loratadine (CLARITIN) 10 MG tablet Take 10 mg by mouth daily as needed for allergies.     . metFORMIN (GLUCOPHAGE) 500 MG tablet Take 1 tablet (500 mg total) by mouth 2 (two) times daily with a meal. 180 tablet 1  . metolazone (ZAROXOLYN) 2.5 MG tablet Take 1 tablet (2.5 mg total) by mouth once a week. Take potassium 46meq with this 4 tablet 6  . metoprolol succinate (TOPROL XL) 25 MG 24 hr tablet Take 1 tablet (25 mg total) by mouth 2 (two) times daily. 60 tablet 11  . Multiple Vitamins-Minerals (PRESERVISION AREDS 2 PO) Take 1 capsule by mouth 2 (two) times  daily.    . potassium chloride (KLOR-CON) 10 MEQ tablet TAKE 1 TABLET (10 MEQ TOTAL) BY MOUTH DAILY. TAKE AN EXTRA 20MEQ (2 TABLETS) EVERY WEEK WITH METOLAZONE (Patient taking differently: Take 10 mEq by mouth daily.) 114 tablet 1  . Riociguat 2.5 MG TABS Take 2.5 mg by mouth 3 (three) times daily. 270 tablet 0  . traMADol (ULTRAM) 50 MG tablet Take 50 mg by mouth every 6 (six) hours as needed for moderate pain.    Marland Kitchen UPTRAVI 1600 MCG TABS Take 1 tablet (1,600 mcg total) by mouth 2 (two) times daily. Take with 800 for a total of 2400 mg 60 tablet 11  . UPTRAVI 800 MCG TABS Take 1 tablet (800 mcg total) by mouth 2 (two) times daily. Take with 1600 mcg for a total of 2400 mg 60 tablet 11  . ferrous sulfate 325 (65 FE) MG tablet Take 1 tablet (325 mg total) by mouth daily with breakfast. Morning and night 30 tablet 0  . furosemide (LASIX) 80 MG tablet Take 1 tablet (80 mg total) by mouth 2 (two) times daily. 60 tablet 2  . levothyroxine (SYNTHROID) 125 MCG tablet Take 1 tablet (125 mcg total) by mouth daily before breakfast. 30 tablet 2   No current facility-administered medications on file prior to visit.    BP 108/60 (BP Location: Left Arm, Patient Position: Sitting, Cuff Size: Normal)   Pulse 78   Temp 97.7 F (36.5 C)   Resp 16   Ht 5\' 5"  (1.651 m)   Wt 158 lb (71.7 kg)   SpO2 99%   BMI 26.29 kg/m    Past Medical History:  Diagnosis Date  . Aortic atherosclerosis ([place]) 08/30/2017  . Aortic stenosis 08/30/2017  . CAD (coronary artery disease), native coronary artery 08/30/2017  . CHF (congestive heart failure) ([place])   . Chronic diastolic heart failure ([place]) 08/30/2017  . Diabetes mellitus with peripheral vascular disease ([place]) 07/14/2014  . Hypertensive heart disease without CHF 08/30/2017  . Hypothyroidism (acquired) 08/30/2017  . Pulmonary hypertension ([place]) 07/04/2017     Social History   Socioeconomic History  . Marital status: Divorced    Spouse name: Not on file  .  Number of children: Not on file  . Years of education: Not on file  . Highest education level: Not on file  Occupational History  . Not on file  Tobacco Use  .  Smoking status: Never Smoker  . Smokeless tobacco: Never Used  Vaping Use  . Vaping Use: Never used  Substance and Sexual Activity  . Alcohol use: No  . Drug use: No  . Sexual activity: Never  Other Topics Concern  . Not on file  Social History Narrative  . Not on file   Social Determinants of Health   Financial Resource Strain: Not on file  Food Insecurity: Not on file  Transportation Needs: Not on file  Physical Activity: Not on file  Stress: Not on file  Social Connections: Not on file  Intimate Partner Violence: Not on file    Past Surgical History:  Procedure Laterality Date  . ANKLE SURGERY    . APPENDECTOMY    . DILATION AND CURETTAGE OF UTERUS    . RIGHT HEART CATH N/A 01/01/2019   Procedure: RIGHT HEART CATH;  Surgeon: Larey Dresser, MD;  Location: [facility];  Service: Cardiovascular;  Laterality: N/A;  . RIGHT/LEFT HEART CATH AND CORONARY ANGIOGRAPHY N/A 09/27/2017   Procedure: RIGHT/LEFT HEART CATH AND CORONARY ANGIOGRAPHY;  Surgeon: Sherren Mocha, MD;  Location: [place] [facility];  Service: Cardiovascular;  Laterality: N/A;  . TEE WITHOUT CARDIOVERSION N/A 01/07/2021   Procedure: TRANSESOPHAGEAL ECHOCARDIOGRAM (TEE);  Surgeon: Larey Dresser, MD;  Location: [facility] ENDOSCOPY;  Service: Cardiovascular;  Laterality: N/A;    No family history on file.  Allergies  Allergen Reactions  . Other Palpitations    Surgical metal (stents, pins, plates) keeps pt from healing  . Adhesive [Tape] Other (See Comments)    Irritates skin with rash  . Nickel Rash    Current Outpatient Medications on File Prior to Visit  Medication Sig Dispense Refill  . amLODipine (NORVASC) 5 MG tablet Take 1 tablet (5 mg total) by mouth daily. 90 tablet 3  . atorvastatin (LIPITOR) 20 MG tablet TAKE 1 TABLET BY MOUTH  IN  THE MORNING 90 tablet 3  . cetirizine (ZYRTEC) 10 MG tablet Take 10 mg by mouth daily as needed for allergies.    . fenofibrate 160 MG tablet Take 1 tablet (160 mg total) by mouth every morning. 30 tablet 2  . loperamide (IMODIUM A-D) 2 MG tablet Take 2 mg by mouth daily as needed for diarrhea or loose stools.    Marland Kitchen loratadine (CLARITIN) 10 MG tablet Take 10 mg by mouth daily as needed for allergies.     . metFORMIN (GLUCOPHAGE) 500 MG tablet Take 1 tablet (500 mg total) by mouth 2 (two) times daily with a meal. 180 tablet 1  . metolazone (ZAROXOLYN) 2.5 MG tablet Take 1 tablet (2.5 mg total) by mouth once a week. Take potassium 21meq with this 4 tablet 6  . metoprolol succinate (TOPROL XL) 25 MG 24 hr tablet Take 1 tablet (25 mg total) by mouth 2 (two) times daily. 60 tablet 11  . Multiple Vitamins-Minerals (PRESERVISION AREDS 2 PO) Take 1 capsule by mouth 2 (two) times daily.    . potassium chloride (KLOR-CON) 10 MEQ tablet TAKE 1 TABLET (10 MEQ TOTAL) BY MOUTH DAILY. TAKE AN EXTRA 20MEQ (2 TABLETS) EVERY WEEK WITH METOLAZONE (Patient taking differently: Take 10 mEq by mouth daily.) 114 tablet 1  . Riociguat 2.5 MG TABS Take 2.5 mg by mouth 3 (three) times daily. 270 tablet 0  . traMADol (ULTRAM) 50 MG tablet Take 50 mg by mouth every 6 (six) hours as needed for moderate pain.    Marland Kitchen UPTRAVI 1600 MCG TABS Take 1  tablet (1,600 mcg total) by mouth 2 (two) times daily. Take with 800 for a total of 2400 mg 60 tablet 11  . UPTRAVI 800 MCG TABS Take 1 tablet (800 mcg total) by mouth 2 (two) times daily. Take with 1600 mcg for a total of 2400 mg 60 tablet 11  . ferrous sulfate 325 (65 FE) MG tablet Take 1 tablet (325 mg total) by mouth daily with breakfast. Morning and night 30 tablet 0  . furosemide (LASIX) 80 MG tablet Take 1 tablet (80 mg total) by mouth 2 (two) times daily. 60 tablet 2  . levothyroxine (SYNTHROID) 125 MCG tablet Take 1 tablet (125 mcg total) by mouth daily before breakfast. 30 tablet  2   No current facility-administered medications on file prior to visit.    BP 108/60 (BP Location: Left Arm, Patient Position: Sitting, Cuff Size: Normal)   Pulse 78   Temp 97.7 F (36.5 C)   Resp 16   Ht 5\' 5"  (1.651 m)   Wt 158 lb (71.7 kg)   SpO2 99%   BMI 26.29 kg/m       Objective:   Physical Exam  General Mental Status- Alert. General Appearance- Not in acute distress.   Skin General: Color- Normal Color. Moisture- Normal Moisture.  Neck Carotid Arteries- Normal color. Moisture- Normal Moisture. No carotid bruits. No JVD.  Chest and Lung Exam Auscultation: Breath Sounds:-Normal.  Cardiovascular Auscultation:Rythm- Regular. Murmurs & Other Heart Sounds:Auscultation of the heart reveals- No Murmurs.  Abdomen Inspection:-Inspeection Normal. Palpation/Percussion:Note:No mass. Palpation and Percussion of the abdomen reveal- Non Tender, Non Distended + BS, no rebound or guarding.   Neurologic Cranial Nerve exam:- CN III-XII intact(No nystagmus), symmetric smile. Strength:- 5/5 equal and symmetric strength both upper and lower extremities.  Lower ext- minimal faint symmetric pedal edema bilaterally. Negative homans signs       Assessment & Plan:  History of CHF and clinically well controlled on current diuretic regimen.  Recent chest x-ray look stable and BNP mild elevated.  Continue to follow-up with cardiologist as regular scheduled/in August.  History of CKD and GFR recently in 30 range.  That is a good idea to go ahead and get you established with nephrologist.  Jim Desanctis to hear that you have appointment in June.  History of diabetes with last A1c 7.0.  You do describe that kitchen where you live offers a lot of carbohydrates.  Recommend that you continue with glimepiride 4 mg daily and can take metformin 500 mg just once daily presently.  If you watch carb intake I think your sugars can stay under control.  Dose reduction of metformin might be beneficial for  kidney function.  Check sugars 1-2 times daily and if your sugars are trending above 180 please let me know.  Follow-up week of August 22 or sooner if needed.  We will repeat CMP and A1c on appointment date. Mackie Pai, PA-C

## 2021-02-15 ENCOUNTER — Ambulatory Visit (HOSPITAL_BASED_OUTPATIENT_CLINIC_OR_DEPARTMENT_OTHER)
Admission: RE | Admit: 2021-02-15 | Discharge: 2021-02-15 | Disposition: A | Discharge status: Home / Self Care | Payer: Medicare Other | Source: Ambulatory Visit | Attending: Medical | Admitting: Medical

## 2021-02-15 ENCOUNTER — Other Ambulatory Visit: Payer: Self-pay

## 2021-02-15 DIAGNOSIS — R918 Other nonspecific abnormal finding of lung field: Secondary | ICD-10-CM | POA: Insufficient documentation

## 2021-03-18 ENCOUNTER — Other Ambulatory Visit: Payer: Self-pay | Admitting: Nephrology

## 2021-03-18 DIAGNOSIS — N1832 Chronic kidney disease, stage 3b: Secondary | ICD-10-CM

## 2021-03-22 ENCOUNTER — Other Ambulatory Visit (HOSPITAL_BASED_OUTPATIENT_CLINIC_OR_DEPARTMENT_OTHER): Payer: Self-pay

## 2021-03-22 ENCOUNTER — Encounter (HOSPITAL_BASED_OUTPATIENT_CLINIC_OR_DEPARTMENT_OTHER): Payer: Self-pay | Admitting: Emergency Medicine

## 2021-03-22 ENCOUNTER — Emergency Department (HOSPITAL_BASED_OUTPATIENT_CLINIC_OR_DEPARTMENT_OTHER): Payer: Medicare Other

## 2021-03-22 ENCOUNTER — Emergency Department (HOSPITAL_BASED_OUTPATIENT_CLINIC_OR_DEPARTMENT_OTHER)
Admission: EM | Admit: 2021-03-22 | Discharge: 2021-03-22 | Disposition: A | Discharge status: Home / Self Care | Payer: Medicare Other | Attending: Emergency Medicine | Admitting: Emergency Medicine

## 2021-03-22 ENCOUNTER — Other Ambulatory Visit: Payer: Self-pay

## 2021-03-22 ENCOUNTER — Telehealth: Payer: Self-pay | Admitting: *Deleted

## 2021-03-22 DIAGNOSIS — I2510 Atherosclerotic heart disease of native coronary artery without angina pectoris: Secondary | ICD-10-CM | POA: Insufficient documentation

## 2021-03-22 DIAGNOSIS — M79642 Pain in left hand: Secondary | ICD-10-CM | POA: Insufficient documentation

## 2021-03-22 DIAGNOSIS — Z79899 Other long term (current) drug therapy: Secondary | ICD-10-CM | POA: Insufficient documentation

## 2021-03-22 DIAGNOSIS — E039 Hypothyroidism, unspecified: Secondary | ICD-10-CM | POA: Diagnosis not present

## 2021-03-22 DIAGNOSIS — Z7984 Long term (current) use of oral hypoglycemic drugs: Secondary | ICD-10-CM | POA: Insufficient documentation

## 2021-03-22 DIAGNOSIS — I110 Hypertensive heart disease with heart failure: Secondary | ICD-10-CM | POA: Diagnosis not present

## 2021-03-22 DIAGNOSIS — I5032 Chronic diastolic (congestive) heart failure: Secondary | ICD-10-CM | POA: Insufficient documentation

## 2021-03-22 DIAGNOSIS — E119 Type 2 diabetes mellitus without complications: Secondary | ICD-10-CM | POA: Insufficient documentation

## 2021-03-22 MED ORDER — PREDNISONE 10 MG PO TABS
20.0000 mg | ORAL_TABLET | Freq: Every day | ORAL | 0 refills | Status: DC
  Filled 2021-03-22: qty 10, 5d supply, fill #0

## 2021-03-22 NOTE — Telephone Encounter (Signed)
Who Is Calling Patient / Member / Family / Caregiver Caller Name [name] Caller Phone Number [phone] Patient Name Alisha [name] Patient DOB [birth year] Call Type Message Only Information Provided Reason for Call Request to Schedule Office Appointment Initial Comment Caller states she would like an appt. 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 Patient request to speak to RN No Additional Comment Caller declined triage. Office hours provided and advised to follow up with the office. Disp. Time Disposition Final User 03/22/2021 7:43:12 AM General Information Provided Yes Gaspar Skeeters

## 2021-03-22 NOTE — Discharge Instructions (Addendum)
You were seen in the emergency department for 2 days of left hand and wrist pain.  Your x-ray showed significant arthritis.  We are putting you on a few days of some prednisone.  This may elevate your blood sugars.  You can also apply ice to the affected area.  Splint for comfort.  Contact your primary care doctor for close follow-up.  Return to the emergency department if any worsening or concerning symptoms

## 2021-03-22 NOTE — Telephone Encounter (Signed)
Called pt and states she is heading to the ER due to severe hand pain for 3 days , declined hospital follow up visit.

## 2021-03-22 NOTE — ED Triage Notes (Signed)
Having arthritis pain in the left hand. Had same pain in left elbow last week. Takes tylenol for pain.

## 2021-03-22 NOTE — ED Provider Notes (Signed)
[facility] EMERGENCY DEPARTMENT Provider Note   CSN: [id number] Arrival date & time: 03/22/21  1023     History Chief Complaint  Patient presents with   Hand Pain    Alisha [name] is a 80 y.o. female.  She is here with a complaint of left hand and wrist pain for the last few days.  She denies any trauma.  She said last week it was in her left elbow.  History of arthritis.  No fevers or chills.  She is diabetic.  Pain is worse with movement.  No numbness.  The history is provided by the patient.  Hand Pain This is a new problem. The current episode started 2 days ago. The problem occurs constantly. The problem has not changed since onset.Pertinent negatives include no chest pain, no abdominal pain, no headaches and no shortness of breath. The symptoms are aggravated by bending and twisting. Nothing relieves the symptoms. She has tried nothing for the symptoms. The treatment provided no relief.      Past Medical History:  Diagnosis Date   Aortic atherosclerosis ([place]) 08/30/2017   Aortic stenosis 08/30/2017   CAD (coronary artery disease), native coronary artery 08/30/2017   CHF (congestive heart failure) (HCC)    Chronic diastolic heart failure ([place]) 08/30/2017   Diabetes mellitus with peripheral vascular disease ([place]) 07/14/2014   Hypertensive heart disease without CHF 08/30/2017   Hypothyroidism (acquired) 08/30/2017   Pulmonary hypertension ([place]) 07/04/2017    Patient Active Problem List   Diagnosis Date Noted   CAD (coronary artery disease), native coronary artery 08/30/2017   Dyspnea 08/30/2017   Aortic atherosclerosis ([place]) 08/30/2017   Hypertensive heart disease without CHF 08/30/2017   Aortic stenosis 08/30/2017   Hypothyroidism (acquired) 08/30/2017   Chronic diastolic heart failure ([place]) 08/30/2017   Pulmonary hypertension ([place]) 81/19/1478   Diastolic dysfunction 29/56/2130   Diabetes mellitus with peripheral vascular disease ([place]) 07/14/2014     Past Surgical History:  Procedure Laterality Date   ANKLE SURGERY     APPENDECTOMY     DILATION AND CURETTAGE OF UTERUS     RIGHT HEART CATH N/A 01/01/2019   Procedure: RIGHT HEART CATH;  Surgeon: Larey Dresser, MD;  Location: [facility];  Service: Cardiovascular;  Laterality: N/A;   RIGHT/LEFT HEART CATH AND CORONARY ANGIOGRAPHY N/A 09/27/2017   Procedure: RIGHT/LEFT HEART CATH AND CORONARY ANGIOGRAPHY;  Surgeon: Sherren Mocha, MD;  Location: [facility];  Service: Cardiovascular;  Laterality: N/A;   TEE WITHOUT CARDIOVERSION N/A 01/07/2021   Procedure: TRANSESOPHAGEAL ECHOCARDIOGRAM (TEE);  Surgeon: Larey Dresser, MD;  Location: [facility] ENDOSCOPY;  Service: Cardiovascular;  Laterality: N/A;     OB History   No obstetric history on file.     History reviewed. No pertinent family history.  Social History   Tobacco Use   Smoking status: Never   Smokeless tobacco: Never  Vaping Use   Vaping Use: Never used  Substance Use Topics   Alcohol use: No   Drug use: No    Home Medications Prior to Admission medications   Medication Sig Start Date End Date Taking? Authorizing Provider  amLODipine (NORVASC) 5 MG tablet Take 1 tablet (5 mg total) by mouth daily. 11/17/20   Saguier, Percell Miller, PA-C  atorvastatin (LIPITOR) 20 MG tablet TAKE 1 TABLET BY MOUTH IN  THE MORNING 01/12/21   Saguier, Percell Miller, PA-C  cetirizine (ZYRTEC) 10 MG tablet Take 10 mg by mouth daily as needed for allergies.  [provider]  fenofibrate 160 MG tablet Take 1 tablet (160 mg total) by mouth every morning. 11/17/20   Saguier, Percell Miller, PA-C  ferrous sulfate 325 (65 FE) MG tablet Take 1 tablet (325 mg total) by mouth daily with breakfast. Morning and night 11/17/20 12/17/20  Saguier, Percell Miller, PA-C  furosemide (LASIX) 80 MG tablet Take 1 tablet (80 mg total) by mouth 2 (two) times daily. 11/17/20 12/17/20  Saguier, Percell Miller, PA-C  glimepiride (AMARYL) 4 MG tablet Take 1 tablet (4 mg total) by mouth daily  with breakfast. 02/04/21 03/06/21  Saguier, Percell Miller, PA-C  levothyroxine (SYNTHROID) 125 MCG tablet Take 1 tablet (125 mcg total) by mouth daily before breakfast. 11/17/20 12/17/20  Saguier, Percell Miller, PA-C  loperamide (IMODIUM A-D) 2 MG tablet Take 2 mg by mouth daily as needed for diarrhea or loose stools.    [provider]  loratadine (CLARITIN) 10 MG tablet Take 10 mg by mouth daily as needed for allergies.     [provider]  metFORMIN (GLUCOPHAGE) 500 MG tablet Take 1 tablet (500 mg total) by mouth 2 (two) times daily with a meal. 11/17/20   Saguier, Percell Miller, PA-C  metolazone (ZAROXOLYN) 2.5 MG tablet Take 1 tablet (2.5 mg total) by mouth once a week. Take potassium 29meq with this 06/08/20   Larey Dresser, MD  metoprolol succinate (TOPROL XL) 25 MG 24 hr tablet Take 1 tablet (25 mg total) by mouth 2 (two) times daily. 11/17/20 11/17/21  Saguier, Percell Miller, PA-C  Multiple Vitamins-Minerals (PRESERVISION AREDS 2 PO) Take 1 capsule by mouth 2 (two) times daily.    [provider]  pantoprazole (PROTONIX) 40 MG tablet Take 1 tablet (40 mg total) by mouth 2 (two) times daily. 02/04/21   Saguier, Percell Miller, PA-C  potassium chloride (KLOR-CON) 10 MEQ tablet TAKE 1 TABLET (10 MEQ TOTAL) BY MOUTH DAILY. TAKE AN EXTRA 20MEQ (2 TABLETS) EVERY WEEK WITH METOLAZONE Patient taking differently: Take 10 mEq by mouth daily. 12/11/20   Bensimhon, Shaune Pascal, MD  Riociguat 2.5 MG TABS Take 2.5 mg by mouth 3 (three) times daily. 01/19/21   Larey Dresser, MD  traMADol (ULTRAM) 50 MG tablet Take 50 mg by mouth every 6 (six) hours as needed for moderate pain. 03/02/20   [provider]  UPTRAVI 1600 MCG TABS Take 1 tablet (1,600 mcg total) by mouth 2 (two) times daily. Take with 800 for a total of 2400 mg 01/22/21   Larey Dresser, MD  UPTRAVI 800 MCG TABS Take 1 tablet (800 mcg total) by mouth 2 (two) times daily. Take with 1600 mcg for a total of 2400 mg 01/22/21   Larey Dresser, MD    Allergies     Other, Adhesive [tape], and Nickel  Review of Systems   Review of Systems  Constitutional:  Negative for fever.  HENT:  Negative for sore throat.   Respiratory:  Negative for shortness of breath.   Cardiovascular:  Negative for chest pain.  Gastrointestinal:  Negative for abdominal pain.  Genitourinary:  Negative for dysuria.  Musculoskeletal:  Negative for neck pain.  Skin:  Negative for rash.  Neurological:  Negative for weakness, numbness and headaches.   Physical Exam Updated Vital Signs BP (!) 125/54 (BP Location: Right Arm)   Pulse 83   Temp 98 F (36.7 C) (Oral)   Resp 18   Ht 5\' 5"  (1.651 m)   Wt 70.3 kg   SpO2 98%   BMI 25.79 kg/m   Physical Exam  Constitutional:      Appearance: Normal appearance. She is well-developed.  HENT:     Head: Normocephalic and atraumatic.  Eyes:     Conjunctiva/sclera: Conjunctivae normal.  Musculoskeletal:        General: Tenderness present. No signs of injury.     Cervical back: Neck supple.     Comments: She is diffusely at the base of her thumb and her wrist.  There is slight warmth although no erythema.  No swelling.  Pain is worse with movement.  Distal cap refill brisk.  Sensory and motor intact.  Elbow and shoulder nontender.  Radial pulse 2+.  Skin:    General: Skin is warm and dry.  Neurological:     General: No focal deficit present.     Mental Status: She is alert.     GCS: GCS eye subscore is 4. GCS verbal subscore is 5. GCS motor subscore is 6.    ED Results / Procedures / Treatments   Labs (all labs ordered are listed, but only abnormal results are displayed) Labs Reviewed - No data to display  EKG None  Radiology DG Wrist Complete Left  Result Date: 03/22/2021 CLINICAL DATA:  Wrist pain and swelling for 3 days, no injury. EXAM: LEFT WRIST - COMPLETE 3+ VIEW COMPARISON:  None. FINDINGS: Osteopenia. Marked sclerosis and loss of joint space in the first carpometacarpal joint. Lateral subluxation of the first  metacarpal. Mild subchondral sclerosis in the scaphoid trapezium trapezoid joint. No acute osseous abnormality. Diffuse soft tissue swelling. IMPRESSION: 1. Advanced first carpometacarpal joint osteoarthritis. 2. Mild scaphoid trapezium trapezoid joint osteoarthritis. 3. Osteopenia. 4. Diffuse soft tissue swelling. Electronically Signed   By: Lorin Picket M.D.   On: 03/22/2021 11:21    Procedures Procedures   Medications Ordered in ED Medications - No data to display  ED Course  I have reviewed the triage vital signs and the nursing notes.  Pertinent labs & imaging results that were available during my care of the patient were reviewed by me and considered in my medical decision making (see chart for details).    MDM Rules/Calculators/A&P                          80 year old female with atraumatic left hand and wrist pain.  Diffusely tender at base of thumb and carpometacarpal area.  Mild swelling.  No particular warmth.  Otherwise nontoxic-appearing.  X-rays show some arthritic changes.  No prior history of gout.  Doubt septic arthritis at this time.  Possible gout versus inflammatory reaction.  Will cover with low-dose steroids.  Recommended close follow-up with PCP. Final Clinical Impression(s) / ED Diagnoses Final diagnoses:  Left hand pain    Rx / DC Orders ED Discharge Orders          Ordered    predniSONE (DELTASONE) 10 MG tablet  Daily        03/22/21 1136             Hayden Rasmussen, MD 03/22/21 1758

## 2021-03-30 ENCOUNTER — Ambulatory Visit
Admission: RE | Admit: 2021-03-30 | Discharge: 2021-03-30 | Disposition: A | Discharge status: Home / Self Care | Payer: Medicare Other | Source: Ambulatory Visit | Attending: Nephrology | Admitting: Nephrology

## 2021-03-30 DIAGNOSIS — N1832 Chronic kidney disease, stage 3b: Secondary | ICD-10-CM

## 2021-04-07 ENCOUNTER — Encounter: Payer: Self-pay | Admitting: Medical

## 2021-04-07 ENCOUNTER — Other Ambulatory Visit: Payer: Self-pay | Admitting: Medical

## 2021-04-07 DIAGNOSIS — E1151 Type 2 diabetes mellitus with diabetic peripheral angiopathy without gangrene: Secondary | ICD-10-CM

## 2021-04-07 MED ORDER — ACCU-CHEK AVIVA PLUS VI STRP: ORAL_STRIP | 12 refills | Status: AC

## 2021-04-07 NOTE — Telephone Encounter (Signed)
Test strips sent in already , patient requesting tramadol

## 2021-04-08 MED ORDER — FUROSEMIDE 80 MG PO TABS: 80.0000 mg | ORAL_TABLET | Freq: Two times a day (BID) | ORAL | 2 refills | Status: DC

## 2021-04-27 ENCOUNTER — Other Ambulatory Visit: Payer: Self-pay

## 2021-04-27 ENCOUNTER — Ambulatory Visit (HOSPITAL_COMMUNITY)
Admission: RE | Admit: 2021-04-27 | Discharge: 2021-04-27 | Disposition: A | Discharge status: Home / Self Care | Payer: Medicare Other | Source: Ambulatory Visit | Attending: Cardiology | Admitting: Cardiology

## 2021-04-27 VITALS — BP 156/60 | HR 84 | Wt 158.6 lb

## 2021-04-27 DIAGNOSIS — Z7984 Long term (current) use of oral hypoglycemic drugs: Secondary | ICD-10-CM | POA: Diagnosis not present

## 2021-04-27 DIAGNOSIS — I272 Pulmonary hypertension, unspecified: Secondary | ICD-10-CM | POA: Diagnosis not present

## 2021-04-27 DIAGNOSIS — E1122 Type 2 diabetes mellitus with diabetic chronic kidney disease: Secondary | ICD-10-CM | POA: Diagnosis not present

## 2021-04-27 DIAGNOSIS — E785 Hyperlipidemia, unspecified: Secondary | ICD-10-CM | POA: Insufficient documentation

## 2021-04-27 DIAGNOSIS — Z79899 Other long term (current) drug therapy: Secondary | ICD-10-CM | POA: Insufficient documentation

## 2021-04-27 DIAGNOSIS — Z86718 Personal history of other venous thrombosis and embolism: Secondary | ICD-10-CM | POA: Insufficient documentation

## 2021-04-27 DIAGNOSIS — I5032 Chronic diastolic (congestive) heart failure: Secondary | ICD-10-CM

## 2021-04-27 DIAGNOSIS — Z7952 Long term (current) use of systemic steroids: Secondary | ICD-10-CM | POA: Insufficient documentation

## 2021-04-27 DIAGNOSIS — G4733 Obstructive sleep apnea (adult) (pediatric): Secondary | ICD-10-CM | POA: Diagnosis not present

## 2021-04-27 DIAGNOSIS — I050 Rheumatic mitral stenosis: Secondary | ICD-10-CM

## 2021-04-27 DIAGNOSIS — I130 Hypertensive heart and chronic kidney disease with heart failure and stage 1 through stage 4 chronic kidney disease, or unspecified chronic kidney disease: Secondary | ICD-10-CM | POA: Insufficient documentation

## 2021-04-27 DIAGNOSIS — E1151 Type 2 diabetes mellitus with diabetic peripheral angiopathy without gangrene: Secondary | ICD-10-CM | POA: Diagnosis not present

## 2021-04-27 DIAGNOSIS — N183 Chronic kidney disease, stage 3 unspecified: Secondary | ICD-10-CM | POA: Diagnosis not present

## 2021-04-27 DIAGNOSIS — Z8616 Personal history of COVID-19: Secondary | ICD-10-CM | POA: Diagnosis not present

## 2021-04-27 LAB — CBC
HCT: 42.3 % (ref 36.0–46.0)
Hemoglobin: 13.1 g/dL (ref 12.0–15.0)
MCH: 27.9 pg (ref 26.0–34.0)
MCHC: 31 g/dL (ref 30.0–36.0)
MCV: 90 fL (ref 80.0–100.0)
Platelets: 374 10*3/uL (ref 150–400)
RBC: 4.7 MIL/uL (ref 3.87–5.11)
RDW: 16.2 % — ABNORMAL HIGH (ref 11.5–15.5)
WBC: 8.6 10*3/uL (ref 4.0–10.5)
nRBC: 0 % (ref 0.0–0.2)

## 2021-04-27 LAB — BASIC METABOLIC PANEL
Anion gap: 13 (ref 5–15)
BUN: 38 mg/dL — ABNORMAL HIGH (ref 8–23)
CO2: 25 mmol/L (ref 22–32)
Calcium: 9.5 mg/dL (ref 8.9–10.3)
Chloride: 102 mmol/L (ref 98–111)
Creatinine, Ser: 1.46 mg/dL — ABNORMAL HIGH (ref 0.44–1.00)
GFR, Estimated: 37 mL/min — ABNORMAL LOW (ref >60)
Glucose, Bld: 210 mg/dL — ABNORMAL HIGH (ref 70–99)
Potassium: 3.8 mmol/L (ref 3.5–5.1)
Sodium: 140 mmol/L (ref 135–145)

## 2021-04-27 LAB — BRAIN NATRIURETIC PEPTIDE: B Natriuretic Peptide: 784.9 pg/mL — ABNORMAL HIGH (ref 0.0–100.0)

## 2021-04-27 MED ORDER — AMLODIPINE BESYLATE 10 MG PO TABS: 10.0000 mg | ORAL_TABLET | Freq: Every day | ORAL | 3 refills | Status: DC

## 2021-04-27 MED ORDER — TORSEMIDE 20 MG PO TABS: 60.0000 mg | ORAL_TABLET | Freq: Two times a day (BID) | ORAL | 11 refills | Status: DC

## 2021-04-27 NOTE — Progress Notes (Signed)
ID:  Alisha [name], DOB [birth year], MRN [id number]   Provider location: [facility] Type of Visit: Established patient   PCP:  Mackie Pai, PA-C  Cardiologist:  Dr. Aundra Dubin   History of Present Illness: Alisha [name] is a 80 y.o. female who has a history of diabetes, HTN, PAD, hyperlipidemia. She was referred by Dr. Wynonia Lawman for evaluation of pulmonary hypertension.    For > 1 year, she has had significant exertional dyspnea. It has been steadily worsening, especially over the last few months.  She has been extensively worked up so far.  Echo in 4/18 showed preserved EF 65% with moderate pulmonary hypertension.  She had episodes of syncope in 5/18 and 10/18.  She wore an event monitor in 5/18 with no significant arrhythmia.  LHC/RHC in 1/19 showed normal PCWP and severely elevated PA pressure, no significant CAD.     At initial appointment in 1/19, she was noted to be hypoxemic with exertion and home oxygen was started for use with exertion.  I also started her on Opsumit. She had to stop this after about a week due to significantly increased exertional peripheral edema.  This resolved after Opsumit was stopped.    Echo in 4/19.  This showed EF 60-65%, mild MS, mild AS, moderately dilated RV with mildly decreased systolic function.    She was found to have Fe deficiency anemia, FOBT+.  EGD showed gastritis and c-scope showed diverticulosis and polyps in 4/19.  She get IV Fe.  She is going to have a capsule endoscopy.    She tried to start ambrisentan, but developed worsening dyspnea and peripheral edema and low oxygen saturation.  This improved after ambrisentan was stopped.    She is now taking riociguat and Uptravi.  RHC in 4/20 showed normal filling pressures but severe pulmonary hypertension with PVR 6 WU.  Echo showed moderately dilated RV with normal systolic function, mild-moderate MS, mild AS.  [name] has now been titrated up to 2400 mcg bid and riociguat  to 1.5 mg tid.    She had COVID-19 PNA in 2/21, hospitalized for about a week.   Echo in 4/21 showed EF 03-21%, grade 2 diastolic dysfunction, normal RV, PASP 50 mmHg, probably mild mitral stenosis with mean gradient 7 mmHg and MVA 2 cm^2 by PHT.  Echo in 4/22 showed EF 60-65%, moderate LVH, moderate AS with mean gradient 27 mmHg and AVA 1.06 cm^2, moderate mitral stenosis with mean gradient 7 and MVA 1.2 cm^2, normal RV size and systolic function, PASP 41 mmHg.  TEE was done in 4/22 to more closely assess the mitral and aortic valves, EF 60-65%, d-shaped septum, mild RV enlargement with mildly decreased RV systolic function, peak RV-RA gradient 38 mmHg, moderate AS with AVA 1.31 cm^2 mean gradient 26 mmHg, mild-moderate MR with mild-moderate MS mean gradient 7 with MVA 1.7 cm^2 by VTI .  She returns for followup of CHF and pulmonary hypertension.  Weight is down 4 lbs.  However, she feels more short of breath than in the past.  She uses a walker when going long distances.  She is short of breath walking about a block.  Mild dyspnea walking into the office today.  Mild orthopnea.  No lightheadedness.  No chest pain. Feels like she has been retaining more fluid for about 2 wks.   ECG (personally reviewed): NSR, LPFB  6 minute walk (1/19): 92 m, oxygen saturation dropped to 70%.   6 minute walk (3/19):  122 m 6 minute walk (4/19): 392 m 6 minute walk (9/19): 317 m 6 minute walk (1/20): 320 m 6 minute walk (8/20): 359 m 6 minute walk (10/20): 391 m 6 minute walk (9/21): 335 m, oxygen saturation 90-96% 6 minute walk (1/22): 305 m 6 minute walk (8/22): 311 m, oxygen saturation dropped as low at 79%   Labs (1/19): anti-Jo1 negative, RF borderline positive, SCL-70 negative, HIV negative. ESR 21. BNP 1129.  Labs (3/19): ANA + => anti-dsDNA negative, SSA/SSB negative, anti-Sm negative, anti-RNP negative. K 3.8 => 3.7, creatinine 1.4 => 1.55.  Labs (4/19): K 4.1 => 3.1, creatinine 1.58 => 1.2, BNP 1902,  CCP Ab negative.  Labs (6/19): hgb 9, creatinine 1.66, K 3 Labs (7/19): K 4.9, creatinine 1.7 Labs (8/19): K 3.6, creatinine 1.36 Labs (9/19): K 4.1, creatinine 1.55, BNP 641 Labs (10/19): K 4.7, creatinine 1.88, BNP 595 Labs (1/20): Hgb 12.2 Labs (4/20): K 4.2, creatinine 2.2, hgb 8.4 Labs (7/20): K 3.2, creatinine 2.07, hgb 8 Labs (8/20): K 3.7, creatinine 1.46 Labs (9/20): K 4, creatinine 1.5 Labs (10/20): K 4.5, creatinine 1.75 Labs (2/21): K 4.1, creatinine 1.78 Labs (7/21): K 3.7, creatinine 1.22, hgb 12.2 Labs (1/22): TSH normal, Mg 2.3, creatinine 1.36, K 3, hgb 12.4, BNP 342 Labs (2/22): LDL 66 Labs (3/22): K 3.6, creatinine 1.02 Labs (5/22): K 4.4, creatinine 1.62, BNP 823   PMH: 1. PAD: Non-healing left ankle ulcer.  - ABIs 9/12 were normal.  2. Type 2 diabetes 3. Hypothyroidism 4. CKD stage 3 5. Hyperlipidemia 6. HTN 7. IBS 8. OSA: on CPAP.  9. Event monitor 5/18: No significant abnormality.  10. DVT in 1995 11. Pulmonary hypertension: Echo (4/18) with EF 65%, mild MR, mild TR, mild mitral stenosis, moderate pulmonary hypertension. - CTA chest 10/17: No PE.  - RHC (1/19): mean RA 11, PA 87/31 mean 51, mean PCWP 6, CI 3.58, PVR 7 WU.  - PFTs (1/19): mild obstruction, some restriction => severely decreased DLCO. - High resolution CT chest (1/19): no interstitial lung disease or emphysema. 7 mm nodule RUL.  - anti-Jo1 negative, RF borderline positive, SCL-70 negative, HIV negative. ANA + => anti-dsDNA negative, SSA/SSB negative, anti-Sm negative, anti-RNP negative. - V/Q scan 2/19: No e/o chronic PE.  - Echo (4/19): EF 60-65%, mild LVH, mild aortic stenosis, mild mitral stenosis, moderately dilated RV with mildly decreased systolic function, PASP 36 mmHg (poor envelope, may be underestimated).  - Does not tolerate ERAs (macitentan, ambrisentan).  - RHC (4/20): mean RA 5, PA 85/22 mean 51, mean PCWP 12, CI  3.5, PVR 6 WU.  - Echo (4/20): EF > 65%, moderate LVH, RV  moderately dilated with normal systolic function, moderate pericardial effusion, mild-moderate mitral stenosis with mean gradient 7/MVA by PHT 2.48 cm^2, mild AS mean gradient 12.  - Echo (4/21): EF 84-66%, grade 2 diastolic dysfunction, normal RV, PASP 50 mmHg, probably mild mitral stenosis with mean gradient 7 mmHg and MVA 2 cm^2 by PHT, mild AS.  - Echo (4/22): EF 60-65%, moderate LVH, moderate AS with mean gradient 27 mmHg and AVA 1.06 cm^2, moderate mitral stenosis with mean gradient 7 and MVA 1.2 cm^2, normal RV size and systolic function, PASP 41 mmHg.  - TEE (4/22): EF 60-65%, d-shaped septum, mild RV enlargement with mildly decreased RV systolic function, peak RV-RA gradient 38 mmHg, moderate AS with AVA 1.31 cm^2 mean gradient 26 mmHg, mild-moderate MR with mild-moderate MS mean gradient 7 with MVA 1.7 cm^2 by VTI . 12.  Coronary angiography 11/18 without significant disease.  - Coronary angiography in 1/19 with luminal irregularities 13. Mitral stenosis: Mild by cath in 1/19, mean gradient 2.6 mmHg. Mild on 4/19 echo.  - Mild-moderate by echo in 4/20.  - Mild by echo in 4/21.  - Moderate by echo in 4/22.  - Mild-moderate by TEE in 4/22.  14. Aortic stenosis: Mild on 4/19 echo.  - Mild on 4/20 echo.  - Mild on 4/21 echo.  - Moderate on 4/22 echo.  - Moderate on 4/22 TEE 15. Fe deficiency anemia: EGD showed gastritis and c-scope showed diverticulosis and polyps in 4/19. Capsule endoscopy was unrevealing.  16. Pulmonary nodule: CT chest 7/19 with RUL nodule.  17. Low back pain/sciatica 18. COVID-19 PNA in 2/21.   Current Outpatient Medications  Medication Sig Dispense Refill   atorvastatin (LIPITOR) 20 MG tablet TAKE 1 TABLET BY MOUTH IN  THE MORNING 90 tablet 3   cetirizine (ZYRTEC) 10 MG tablet Take 10 mg by mouth daily as needed for allergies.     fenofibrate 160 MG tablet Take 1 tablet (160 mg total) by mouth every morning. 30 tablet 2   ferrous sulfate 325 (65 FE) MG tablet Take  1 tablet (325 mg total) by mouth daily with breakfast. Morning and night 30 tablet 0   glimepiride (AMARYL) 4 MG tablet Take 1 tablet (4 mg total) by mouth daily with breakfast. 90 tablet 2   glucose blood (ACCU-CHEK AVIVA PLUS) test strip Check blood sugar TID dx: E11.9 100 each 12   levothyroxine (SYNTHROID) 125 MCG tablet Take 1 tablet (125 mcg total) by mouth daily before breakfast. 30 tablet 2   loperamide (IMODIUM A-D) 2 MG tablet Take 2 mg by mouth daily as needed for diarrhea or loose stools.     loratadine (CLARITIN) 10 MG tablet Take 10 mg by mouth daily as needed for allergies.      metFORMIN (GLUCOPHAGE) 500 MG tablet Take 1 tablet (500 mg total) by mouth 2 (two) times daily with a meal. 180 tablet 1   metolazone (ZAROXOLYN) 2.5 MG tablet Take 1 tablet (2.5 mg total) by mouth once a week. Take potassium 61mq with this 4 tablet 6   metoprolol succinate (TOPROL XL) 25 MG 24 hr tablet Take 1 tablet (25 mg total) by mouth 2 (two) times daily. 60 tablet 11   Multiple Vitamins-Minerals (PRESERVISION AREDS 2 PO) Take 1 capsule by mouth 2 (two) times daily.     pantoprazole (PROTONIX) 40 MG tablet TAKE 1 TABLET BY MOUTH  TWICE DAILY 180 tablet 3   potassium chloride (KLOR-CON) 10 MEQ tablet TAKE 1 TABLET (10 MEQ TOTAL) BY MOUTH DAILY. TAKE AN EXTRA 20MEQ (2 TABLETS) EVERY WEEK WITH METOLAZONE (Patient taking differently: Take 10 mEq by mouth daily.) 114 tablet 1   predniSONE (DELTASONE) 10 MG tablet Take 2 tablets (20 mg total) by mouth daily. 10 tablet 0   Riociguat 2.5 MG TABS Take 2.5 mg by mouth 3 (three) times daily. 270 tablet 0   torsemide (DEMADEX) 20 MG tablet Take 3 tablets (60 mg total) by mouth 2 (two) times daily. 180 tablet 11   traMADol (ULTRAM) 50 MG tablet Take 50 mg by mouth every 6 (six) hours as needed for moderate pain.     UPTRAVI 1600 MCG TABS Take 1 tablet (1,600 mcg total) by mouth 2 (two) times daily. Take with 800 for a total of 2400 mg 60 tablet 11   UPTRAVI 800 MCG  TABS Take  1 tablet (800 mcg total) by mouth 2 (two) times daily. Take with 1600 mcg for a total of 2400 mg 60 tablet 11   amLODipine (NORVASC) 10 MG tablet Take 1 tablet (10 mg total) by mouth daily. 90 tablet 3   No current facility-administered medications for this encounter.    Allergies:   Other, Adhesive [tape], and Nickel   Social History:  The patient  reports that she has never smoked. She has never used smokeless tobacco. She reports that she does not drink alcohol and does not use drugs.   Family History:  The patient's family history is not on file.   ROS:  Please see the history of present illness.   All other systems are personally reviewed and negative.   Exam:   BP (!) 156/60   Pulse 84   Wt 71.9 kg (158 lb 9.6 oz)   SpO2 96%   BMI 26.39 kg/m  General: NAD Neck: JVP 8-9 cm with HJR, no thyromegaly or thyroid nodule.  Lungs: Clear to auscultation bilaterally with normal respiratory effort. CV: Nondisplaced PMI.  Heart regular S1/S2, no S3/S4, 3/6 SEM RUSB with clear S2.  1+ edema 1/2 up lower legs bilaterally.  No carotid bruit.  Normal pedal pulses.  Abdomen: Soft, nontender, no hepatosplenomegaly, mild distention.  Skin: Intact without lesions or rashes.  Neurologic: Alert and oriented x 3.  Psych: Normal affect. Extremities: No clubbing or cyanosis.  HEENT: Normal.    Recent Labs: 09/18/2020: Magnesium 2.3; TSH 0.568 01/26/2021: ALT 9; Pro B Natriuretic peptide (BNP) 823.0 04/27/2021: B Natriuretic Peptide 784.9; BUN 38; Creatinine, Ser 1.46; Hemoglobin 13.1; Platelets 374; Potassium 3.8; Sodium 140  Personally reviewed   Wt Readings from Last 3 Encounters:  04/27/21 71.9 kg (158 lb 9.6 oz)  03/22/21 70.3 kg (155 lb)  02/04/21 71.7 kg (158 lb)    ASSESSMENT AND PLAN:  1. Pulmonary hypertension with RV failure:  Severe PAH with PVR 7 WU on 1/19 RHC.  Etiology uncertain.  She has a history of DVT remotely but V/Q scan in 2/19 did not show evidence for chronic  PE. She has OSA and uses CPAP.  Serologic workup showed only positive ANA but when additional serologies were done, they were all negative (see above).  CT chest with no ILD or emphysema.  Valvular heart disease does not appear significant enough to cause this degree of PH.  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  Echo in 4/19 showed that the RV is moderately dilated with mildly decreased systolic function, PA systolic pressure estimation was only 36 mmHg but poor envelope and likely underestimated. She did not tolerate Opsumit or ambrisentan due to significant lower extremity edema and worsening dyspnea.  [place] in 4/20 showed severe pulmonary hypertension with normal filling pressures and preserved cardiac output.  Echo in 4/21 showed normal RV with PASP 50 mmHg, echo 4/22 showed normal RV with PASP estimated 41 mmHg. TEE in 4/22 showed D-shaped septum with mild RV enlargement/mildly decreased RV systolic function.  She is volume overloaded on exam though 6 minute walk is stable.   - Continue Adempas, she is at goal.  [name] is up to 2400 mcg BID.   - She needs to use oxygen with exertion.  2. Chronic diastolic CHF: In setting of pulmonary hypertension and valvular disease.  Worsened symptoms, NYHA class III. She is volume overloaded on exam.   - Stop Lasix, start torsemide 60 mg bid.  BMET today and in 10 days.  - She  will continue metolazone once weekly on Saturdays.  3. Syncope: Suspect related to severe pulmonary hypertension.  None recently.   4. Mitral stenosis: Noted on echo and cath.  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  With heavy calcification, I do not think that she would be a mitral valvuloplasty candidate.  - Would not consider for surgery at this point (she would be high risk).  5. H/o DVT: No chronic PE on prior V/Q scan.   6. HTN: BP elevated, can increase amlodipine to 10 mg daily.   7. OSA: Continue CPAP qHS.  8. CKD: Stage 3.  BMET today.   9. Aortic  stenosis: Moderate by 4/22 TEE.  She would be a TAVR candidate if AS severe, but could not percutaneously fix the mitral valve so may need to consider surgical MVR/AVR in the future.   10. H/o GI bleeding: Slow chronic GI bleed, source has not been found despite extensive workup.  - CBC today.   Followup in 3 wks with APP.    Signed, Loralie Champagne, MD  04/27/2021  [facility] [address] [facility] [place] [address] [phone] (office) [phone] (fax)

## 2021-04-27 NOTE — Progress Notes (Signed)
6MW completed Patient ambulated 1020 Ft (310.44m) without difficulty or rest breaks HR ranged 83-106 O2 Sats ranges 79-97% on room air  -pt does have an order for oxygen to use as needed-advised patient to wear while increased activity/long walks

## 2021-04-27 NOTE — Patient Instructions (Signed)
EKG done today.  6 minute walk test done today.  Labs done today. We will contact you only if your labs are abnormal.  STOP taking Lasix  START Torsemide 60mg  (3 tablets) by mouth 2 times daily.  INCREASE Amlodipine to 10mg  (1 tablet) by mouth daily.  No other medication changes were made. Please continue all current medications as prescribed.  Your physician recommends that you schedule a follow-up appointment in: 10 days for a lab only appointment and in 3 weeks with our [facility] here in our office.  If you have any questions or concerns before your next appointment please send [place] a message through [place] or call our office at [phone].    TO LEAVE A MESSAGE FOR THE NURSE SELECT OPTION 2, PLEASE LEAVE A MESSAGE INCLUDING: YOUR NAME DATE OF BIRTH CALL BACK NUMBER REASON FOR CALL**this is important as we prioritize the call backs  YOU WILL RECEIVE A CALL BACK THE SAME DAY AS LONG AS YOU CALL BEFORE 4:00 PM   Do the following things EVERYDAY: Weigh yourself in the morning before breakfast. Write it down and keep it in a log. Take your medicines as prescribed Eat low salt foods--Limit salt (sodium) to 2000 mg per day.  Stay as active as you can everyday Limit all fluids for the day to less than 2 liters   At the [facility], you and your health needs are our priority. As part of our continuing mission to provide you with exceptional heart care, we have created designated Provider Care Teams. These Care Teams include your primary Cardiologist (physician) and Advanced Practice Providers (APPs- Physician Assistants and Nurse Practitioners) who all work together to provide you with the care you need, when you need it.   You may see any of the following providers on your designated Care Team at your next follow up: Dr Glori Bickers Dr Haynes Kerns, NP Lyda Jester, [place] Audry Riles, PharmD   Please be sure to bring in all your  medications bottles to every appointment.

## 2021-05-04 ENCOUNTER — Ambulatory Visit: Payer: Medicare Other | Admitting: Medical

## 2021-05-10 ENCOUNTER — Other Ambulatory Visit: Payer: Self-pay

## 2021-05-10 ENCOUNTER — Ambulatory Visit (INDEPENDENT_AMBULATORY_CARE_PROVIDER_SITE_OTHER): Payer: Medicare Other | Admitting: Medical

## 2021-05-10 ENCOUNTER — Ambulatory Visit (HOSPITAL_BASED_OUTPATIENT_CLINIC_OR_DEPARTMENT_OTHER)
Admission: RE | Admit: 2021-05-10 | Discharge: 2021-05-10 | Disposition: A | Discharge status: Home / Self Care | Payer: Medicare Other | Source: Ambulatory Visit | Attending: Medical | Admitting: Medical

## 2021-05-10 VITALS — BP 100/64 | HR 66 | Temp 98.4°F | Resp 18 | Ht 65.0 in | Wt 158.4 lb

## 2021-05-10 DIAGNOSIS — I5032 Chronic diastolic (congestive) heart failure: Secondary | ICD-10-CM | POA: Diagnosis not present

## 2021-05-10 DIAGNOSIS — M25572 Pain in left ankle and joints of left foot: Secondary | ICD-10-CM

## 2021-05-10 DIAGNOSIS — E1151 Type 2 diabetes mellitus with diabetic peripheral angiopathy without gangrene: Secondary | ICD-10-CM | POA: Diagnosis not present

## 2021-05-10 DIAGNOSIS — I10 Essential (primary) hypertension: Secondary | ICD-10-CM | POA: Diagnosis not present

## 2021-05-10 DIAGNOSIS — R944 Abnormal results of kidney function studies: Secondary | ICD-10-CM | POA: Diagnosis not present

## 2021-05-10 DIAGNOSIS — M1000 Idiopathic gout, unspecified site: Secondary | ICD-10-CM

## 2021-05-10 DIAGNOSIS — N189 Chronic kidney disease, unspecified: Secondary | ICD-10-CM | POA: Diagnosis not present

## 2021-05-10 DIAGNOSIS — D649 Anemia, unspecified: Secondary | ICD-10-CM

## 2021-05-10 DIAGNOSIS — I350 Nonrheumatic aortic (valve) stenosis: Secondary | ICD-10-CM

## 2021-05-10 LAB — CBC WITH DIFFERENTIAL/PLATELET
Basophils Absolute: 0 10*3/uL (ref 0.0–0.1)
Basophils Relative: 0.3 % (ref 0.0–3.0)
Eosinophils Absolute: 0.1 10*3/uL (ref 0.0–0.7)
Eosinophils Relative: 0.6 % (ref 0.0–5.0)
HCT: 35.3 % — ABNORMAL LOW (ref 36.0–46.0)
Hemoglobin: 11.5 g/dL — ABNORMAL LOW (ref 12.0–15.0)
Lymphocytes Relative: 5.6 % — ABNORMAL LOW (ref 12.0–46.0)
Lymphs Abs: 0.6 10*3/uL — ABNORMAL LOW (ref 0.7–4.0)
MCHC: 32.6 g/dL (ref 30.0–36.0)
MCV: 85 fl (ref 78.0–100.0)
Monocytes Absolute: 0.7 10*3/uL (ref 0.1–1.0)
Monocytes Relative: 6.2 % (ref 3.0–12.0)
Neutro Abs: 10.2 10*3/uL — ABNORMAL HIGH (ref 1.4–7.7)
Neutrophils Relative %: 87.3 % — ABNORMAL HIGH (ref 43.0–77.0)
Platelets: 276 10*3/uL (ref 150.0–400.0)
RBC: 4.15 Mil/uL (ref 3.87–5.11)
RDW: 16.6 % — ABNORMAL HIGH (ref 11.5–15.5)
WBC: 11.7 10*3/uL — ABNORMAL HIGH (ref 4.0–10.5)

## 2021-05-10 LAB — SEDIMENTATION RATE: Sed Rate: 46 mm/hr — ABNORMAL HIGH (ref 0–30)

## 2021-05-10 LAB — URIC ACID: Uric Acid, Serum: 13.4 mg/dL — ABNORMAL HIGH (ref 2.4–7.0)

## 2021-05-10 LAB — HEMOGLOBIN A1C: Hgb A1c MFr Bld: 5.9 % (ref 4.6–6.5)

## 2021-05-10 MED ORDER — TRAMADOL HCL 50 MG PO TABS: 50.0000 mg | ORAL_TABLET | Freq: Four times a day (QID) | ORAL | 0 refills | Status: DC | PRN

## 2021-05-10 MED ORDER — PREDNISONE 10 MG (21) PO TBPK: ORAL_TABLET | ORAL | 0 refills | Status: DC

## 2021-05-10 NOTE — Addendum Note (Signed)
Addended by: Anabel Halon on: 05/10/2021 10:09 PM   Modules accepted: Orders

## 2021-05-10 NOTE — Progress Notes (Signed)
Subjective:    Patient ID: Alisha [name], female    DOB: [birth year], 80 y.o.   MRN: [id number]  HPI   Pt in for follow up.  Pt give me update that most recent hb/hct well controlled. Levels 13.1/42.3. Hx of gi bleed with various transfusion and iron infusions.  Pt has hx of chf. DrDalton A/P below in"  "1. Pulmonary hypertension with RV failure:  Severe PAH with PVR 7 WU on 1/19 RHC.  Etiology uncertain.  She has a history of DVT remotely but V/Q scan in 2/19 did not show evidence for chronic PE. She has OSA and uses CPAP.  Serologic workup showed only positive ANA but when additional serologies were done, they were all negative (see above).  CT chest with no ILD or emphysema.  Valvular heart disease does not appear significant enough to cause this degree of PH.  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  Echo in 4/19 showed that the RV is moderately dilated with mildly decreased systolic function, PA systolic pressure estimation was only 36 mmHg but poor envelope and likely underestimated. She did not tolerate Opsumit or ambrisentan due to significant lower extremity edema and worsening dyspnea.  [place] in 4/20 showed severe pulmonary hypertension with normal filling pressures and preserved cardiac output.  Echo in 4/21 showed normal RV with PASP 50 mmHg, echo 4/22 showed normal RV with PASP estimated 41 mmHg. TEE in 4/22 showed D-shaped septum with mild RV enlargement/mildly decreased RV systolic function.  She is volume overloaded on exam though 6 minute walk is stable.   - Continue Adempas, she is at goal.  [name] is up to 2400 mcg BID.   - She needs to use oxygen with exertion.  2. Chronic diastolic CHF: In setting of pulmonary hypertension and valvular disease.  Worsened symptoms, NYHA class III. She is volume overloaded on exam.   - Stop Lasix, start torsemide 60 mg bid.  BMET today and in 10 days.  - She will continue metolazone once weekly on Saturdays.  3. Syncope: Suspect related to severe pulmonary  hypertension.  None recently.   4. Mitral stenosis: Noted on echo and cath.  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  With heavy calcification, I do not think that she would be a mitral valvuloplasty candidate.  - Would not consider for surgery at this point (she would be high risk).  5. H/o DVT: No chronic PE on prior V/Q scan.   6. HTN: BP elevated, can increase amlodipine to 10 mg daily.   7. OSA: Continue CPAP qHS.  8. CKD: Stage 3.  BMET today.   9. Aortic stenosis: Moderate by 4/22 TEE.  She would be a TAVR candidate if AS severe, but could not percutaneously fix the mitral valve so may need to consider surgical MVR/AVR in the future.   10. H/o GI bleeding: Slow chronic GI bleed, source has not been found despite extensive workup.  - CBC today."     Pt has recent left ankle pain since last night. [place] [employer] [place]. She states walking around last night and did not have pain.  Pt had ED evaluation for left hand and wrist pain early July. She was given low dose prednisone.   About on year ago given tramadol for pain. She states no side effects.    Review of Systems  Constitutional:  Negative for chills, fatigue and fever.  Respiratory:  Negative for cough, chest tightness, shortness  of breath and wheezing.   Cardiovascular:  Negative for chest pain and palpitations.  Gastrointestinal:  Negative for abdominal pain.  Genitourinary:  Negative for dysuria, flank pain and frequency.  Musculoskeletal:  Negative for back pain.       Left ankle pain.  Skin:  Negative for rash.  Neurological:  Negative for dizziness, light-headedness, numbness and headaches.  Hematological:  Negative for adenopathy. Does not bruise/bleed easily.  Psychiatric/Behavioral:  Negative for behavioral problems and decreased concentration.     Past Medical History:  Diagnosis Date   Aortic atherosclerosis ([place]) 08/30/2017   Aortic stenosis 08/30/2017    CAD (coronary artery disease), native coronary artery 08/30/2017   CHF (congestive heart failure) ([facility])    Chronic diastolic heart failure ([place]) 08/30/2017   Diabetes mellitus with peripheral vascular disease ([place]) 07/14/2014   Hypertensive heart disease without CHF 08/30/2017   Hypothyroidism (acquired) 08/30/2017   Pulmonary hypertension ([place]) 07/04/2017     Social History   Socioeconomic History   Marital status: Divorced    Spouse name: Not on file   Number of children: Not on file   Years of education: Not on file   Highest education level: Not on file  Occupational History   Not on file  Tobacco Use   Smoking status: Never   Smokeless tobacco: Never  Vaping Use   Vaping Use: Never used  Substance and Sexual Activity   Alcohol use: No   Drug use: No   Sexual activity: Never  Other Topics Concern   Not on file  Social History Narrative   Not on file   Social Determinants of Health   Financial Resource Strain: Not on file  Food Insecurity: Not on file  Transportation Needs: Not on file  Physical Activity: Not on file  Stress: Not on file  Social Connections: Not on file  Intimate Partner Violence: Not on file    Past Surgical History:  Procedure Laterality Date   [place] CATH N/A 01/01/2019   Procedure: RIGHT HEART CATH;  Surgeon: Larey Dresser, MD;  Location: [facility];  Service: Cardiovascular;  Laterality: N/A;   RIGHT/LEFT HEART CATH AND CORONARY ANGIOGRAPHY N/A 09/27/2017   Procedure: RIGHT/LEFT HEART CATH AND CORONARY ANGIOGRAPHY;  Surgeon: Sherren Mocha, MD;  Location: [place];  Service: Cardiovascular;  Laterality: N/A;   TEE WITHOUT CARDIOVERSION N/A 01/07/2021   Procedure: TRANSESOPHAGEAL ECHOCARDIOGRAM (TEE);  Surgeon: Larey Dresser, MD;  [facility];  Service: Cardiovascular;  Laterality: N/A;    No family history on file.  Allergies   Allergen Reactions   Other Palpitations    Surgical metal (stents, pins, plates) keeps pt from healing   Adhesive [Tape] Other (See Comments)    Irritates skin with rash   Nickel Rash    Current Outpatient Medications on File Prior to Visit  Medication Sig Dispense Refill   amLODipine (NORVASC) 10 MG tablet Take 1 tablet (10 mg total) by mouth daily. 90 tablet 3   atorvastatin (LIPITOR) 20 MG tablet TAKE 1 TABLET BY MOUTH IN  THE MORNING 90 tablet 3   cetirizine (ZYRTEC) 10 MG tablet Take 10 mg by mouth daily as needed for allergies.     fenofibrate 160 MG tablet Take 1 tablet (160 mg total) by mouth every morning. 30 tablet 2   glucose blood (ACCU-CHEK AVIVA PLUS) test strip  Check blood sugar TID dx: E11.9 100 each 12   loperamide (IMODIUM A-D) 2 MG tablet Take 2 mg by mouth daily as needed for diarrhea or loose stools.     loratadine (CLARITIN) 10 MG tablet Take 10 mg by mouth daily as needed for allergies.      metFORMIN (GLUCOPHAGE) 500 MG tablet Take 1 tablet (500 mg total) by mouth 2 (two) times daily with a meal. 180 tablet 1   metolazone (ZAROXOLYN) 2.5 MG tablet Take 1 tablet (2.5 mg total) by mouth once a week. Take potassium 77meq with this 4 tablet 6   metoprolol succinate (TOPROL XL) 25 MG 24 hr tablet Take 1 tablet (25 mg total) by mouth 2 (two) times daily. 60 tablet 11   Multiple Vitamins-Minerals (PRESERVISION AREDS 2 PO) Take 1 capsule by mouth 2 (two) times daily.     pantoprazole (PROTONIX) 40 MG tablet TAKE 1 TABLET BY MOUTH  TWICE DAILY 180 tablet 3   potassium chloride (KLOR-CON) 10 MEQ tablet TAKE 1 TABLET (10 MEQ TOTAL) BY MOUTH DAILY. TAKE AN EXTRA 20MEQ (2 TABLETS) EVERY WEEK WITH METOLAZONE (Patient taking differently: Take 10 mEq by mouth daily.) 114 tablet 1   Riociguat 2.5 MG TABS Take 2.5 mg by mouth 3 (three) times daily. 270 tablet 0   torsemide (DEMADEX) 20 MG tablet Take 3 tablets (60 mg total) by mouth 2 (two) times daily. 180 tablet 11   traMADol  (ULTRAM) 50 MG tablet Take 50 mg by mouth every 6 (six) hours as needed for moderate pain.     UPTRAVI 1600 MCG TABS Take 1 tablet (1,600 mcg total) by mouth 2 (two) times daily. Take with 800 for a total of 2400 mg 60 tablet 11   UPTRAVI 800 MCG TABS Take 1 tablet (800 mcg total) by mouth 2 (two) times daily. Take with 1600 mcg for a total of 2400 mg 60 tablet 11   ferrous sulfate 325 (65 FE) MG tablet Take 1 tablet (325 mg total) by mouth daily with breakfast. Morning and night 30 tablet 0   glimepiride (AMARYL) 4 MG tablet Take 1 tablet (4 mg total) by mouth daily with breakfast. 90 tablet 2   levothyroxine (SYNTHROID) 125 MCG tablet Take 1 tablet (125 mcg total) by mouth daily before breakfast. 30 tablet 2   No current facility-administered medications on file prior to visit.    BP 100/64 (BP Location: Left Arm, Patient Position: Sitting, Cuff Size: Normal)   Pulse 66   Temp 98.4 F (36.9 C) (Oral)   Resp 18   Ht 5\' 5"  (1.651 m)   Wt 158 lb 6.4 oz (71.8 kg)   SpO2 96%   BMI 26.36 kg/m       Objective:   Physical Exam   General Mental Status- Alert. General Appearance- Not in acute distress.    Skin General: Color- Normal Color. Moisture- Normal Moisture.   Neck Carotid Arteries- Normal color. Moisture- Normal Moisture. No carotid bruits. No JVD.   Chest and Lung Exam Auscultation: Breath Sounds:-Normal.   Cardiovascular Auscultation:Rythm- Regular. Murmurs & Other Heart Sounds:Auscultation of the heart reveals- abvious murmur. Has aortic stenosi.   Abdomen Inspection:-Inspeection Normal. Palpation/Percussion:Note:No mass. Palpation and Percussion of the abdomen reveal- Non Tender, Non Distended + BS, no rebound or guarding.     Neurologic Cranial Nerve exam:- CN III-XII intact(No nystagmus), symmetric smile. Strength:- 5/5 equal and symmetric strength both upper and lower extremities.   Lower ext- minimal faint symmetric pedal edema  bilaterally. Negative homans  signs. Left lateral mallous swelling and very tender to palpation.     Assessment & Plan:  1 day of left-sided ankle pain.  Moderate to severe tenderness on light palpation.  Considering possible gout.  Will get uric acid, CBC and sed rate.  Also get left ankle x-ray.  We will wait for the lab and imaging results.  Likely will prescribe low-dose taper of prednisone.  Also going ahead and making a tablet prescription of tramadol available to use sparingly for severe joint pains which you do have in the past intermittently.  Rx advisement given.  History of CHF, hypertension and valvular disease.  Continue current cardiac meds and follow-up with her cardiologist in a couple weeks as planned.  History of anemia with GI bleed.  Good to see that most recent hemoglobin and hematocrit are in normal range.  History of diabetes with A1c 7 more than 3 months ago.  Recent low sugar events intermittently when he first wake.  We will get A1c today.  Advised continue metformin and presently hold/stop Amaryl.  Decreased GFR.  Continue to follow-up with nephrologist as scheduled.  Follow-up date to be determined after lab review.  Mackie Pai, PA-C

## 2021-05-10 NOTE — Patient Instructions (Signed)
1 day of left-sided ankle pain.  Moderate to severe tenderness on light palpation.  Considering possible gout.  Will get uric acid, CBC and sed rate.  Also get left ankle x-ray.  We will wait for the lab and imaging results.  Likely will prescribe low-dose taper of prednisone.  Also going ahead and making a tablet prescription of tramadol available to use sparingly for severe joint pains which you do have in the past intermittently.  Rx advisement given.  History of CHF, hypertension and valvular disease.  Continue current cardiac meds and follow-up with her cardiologist in a couple weeks as planned.  History of anemia with GI bleed.  Good to see that most recent hemoglobin and hematocrit are in normal range.  History of diabetes with A1c 7 more than 3 months ago.  Recent low sugar events intermittently when he first wake.  We will get A1c today.  Advised continue metformin and presently hold/stop Amaryl.  Decreased GFR.  Continue to follow-up with nephrologist as scheduled.  Follow-up date to be determined after lab review.

## 2021-05-11 ENCOUNTER — Ambulatory Visit (HOSPITAL_COMMUNITY)
Admission: RE | Admit: 2021-05-11 | Discharge: 2021-05-11 | Disposition: A | Discharge status: Home / Self Care | Payer: Medicare Other | Source: Ambulatory Visit | Attending: Cardiology | Admitting: Cardiology

## 2021-05-11 DIAGNOSIS — I5032 Chronic diastolic (congestive) heart failure: Secondary | ICD-10-CM | POA: Diagnosis present

## 2021-05-11 LAB — BASIC METABOLIC PANEL
Anion gap: 13 (ref 5–15)
BUN: 51 mg/dL — ABNORMAL HIGH (ref 8–23)
CO2: 21 mmol/L — ABNORMAL LOW (ref 22–32)
Calcium: 8.9 mg/dL (ref 8.9–10.3)
Chloride: 104 mmol/L (ref 98–111)
Creatinine, Ser: 1.83 mg/dL — ABNORMAL HIGH (ref 0.44–1.00)
GFR, Estimated: 28 mL/min — ABNORMAL LOW (ref >60)
Glucose, Bld: 130 mg/dL — ABNORMAL HIGH (ref 70–99)
Potassium: 3.6 mmol/L (ref 3.5–5.1)
Sodium: 138 mmol/L (ref 135–145)

## 2021-05-13 NOTE — Progress Notes (Signed)
Office Visit Note  Patient: Alisha [name]             Date of Birth: [birth year]           MRN: [id number]             PCP: Elise Benne Referring: Elise Benne Visit Date: 05/14/2021  Subjective:  New Patient (Initial Visit) (Patient had recent flare of symptoms in left arm, left hand, and left ankle. Patient finished course of Prednisone recently.)   History of Present Illness: Alisha [name] is a 80 y.o. female here for gout with joint pain and swelling most recently involving left ankle and left wrist. She is on significant loop diuretic treatment for diastolic heart failure, currently torsemide 60 mg PO BID. 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 She usually noticed attacks about once per year but she recently started to suffer increased frequency of inflammation to about every 3 months for over a years and very recently had a left ankle pain and swelling and painful swelling of her left wrist which was never previously affected. Symptoms improved with short duration of prednisone treatment and she does not take any long term maintenance drug. She is a never smoker she does not drink alcohol.  She does not follow very specific dietary modifications for gout though does not generally eat excessive amounts of seafood, red meat, or other particularly high purine diet.  Labs reviewed 04/2021 ESR 46 Uric acid 13.4 BMP eGFR 28  Activities of Daily Living:  Patient reports morning stiffness for 0 minutes.   Patient Denies nocturnal pain.  Difficulty dressing/grooming: Denies Difficulty climbing stairs: Denies Difficulty getting out of chair: Denies Difficulty using hands for taps, buttons, cutlery, and/or writing: Reports  Review of Systems  Constitutional:  Positive for fatigue.  HENT:  Negative for mouth sores, mouth dryness  and nose dryness.   Eyes:  Negative for pain, itching, visual disturbance and dryness.  Respiratory:  Positive for shortness of breath. Negative for cough, hemoptysis and difficulty breathing.   Cardiovascular:  Positive for swelling in legs/feet. Negative for chest pain and palpitations.  Gastrointestinal:  Negative for abdominal pain, blood in stool, constipation and diarrhea.  Endocrine: Negative for increased urination.  Genitourinary:  Negative for painful urination.  Musculoskeletal:  Positive for joint pain, joint pain and muscle weakness. Negative for joint swelling, myalgias, morning stiffness, muscle tenderness and myalgias.  Skin:  Positive for color change. Negative for rash and redness.  Allergic/Immunologic: Negative for susceptible to infections.  Neurological:  Negative for dizziness, numbness, headaches, memory loss and weakness.  Hematological:  Negative for swollen glands.  Psychiatric/Behavioral:  Negative for confusion and sleep disturbance.    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   Iron deficiency anemia due to chronic blood loss 02/21/2018   Tubular adenoma of colon 01/03/2018   Thrombophlebitis of superficial veins of right lower extremity 12/18/2017   CAD (coronary artery disease), native coronary artery 08/30/2017   Dyspnea 08/30/2017   Aortic atherosclerosis ([place]) 08/30/2017   Hypertensive heart disease without CHF 08/30/2017   Aortic stenosis 08/30/2017   Hypothyroidism (acquired) 08/30/2017   Chronic diastolic heart failure ([place]) 08/30/2017  Fungal dermatitis 07/06/2017   Pulmonary hypertension ([place]) 07/04/2017   Cataract, nuclear sclerotic, left eye 97/98/9211   Diastolic dysfunction 94/17/4081   Non-rheumatic mitral valve stenosis 05/17/2016   LAE (left atrial enlargement) 05/17/2016   Chronic  venous insufficiency 04/11/2016   Diabetes mellitus with peripheral vascular disease ([place]) 07/14/2014   Type II or unspecified type diabetes mellitus with peripheral circulatory disorders, not stated as uncontrolled(250.70) 06/05/2014   PAD (peripheral artery disease) ([place]) 05/20/2014   GERD (gastroesophageal reflux disease) 11/12/2012   Chronic kidney disease, stage III (moderate) ([place]) 04/19/2010    Past Medical History:  Diagnosis Date   Aortic atherosclerosis ([place]) 08/30/2017   Aortic stenosis 08/30/2017   CAD (coronary artery disease), native coronary artery 08/30/2017   CHF (congestive heart failure) (H[facility])    Chronic diastolic heart failure ([place]) 08/30/2017   Diabetes mellitus with peripheral vascular disease ([place]) 07/14/2014   Hypertensive heart disease without CHF 08/30/2017   Hypothyroidism (acquired) 08/30/2017   Pulmonary hypertension ([place]) 07/04/2017    Family History  Problem Relation Age of Onset   Diabetes Mother    Bleeding Disorder Mother    Heart disease Father    Alzheimer's disease Father    Diabetes Sister    Diabetes Brother    Heart disease Brother    Healthy Daughter    Healthy Son    Past Surgical History:  Procedure Laterality Date   ANKLE SURGERY     APPENDECTOMY     DILATION AND CURETTAGE OF UTERUS     RIGHT HEART CATH N/A 01/01/2019   Procedure: RIGHT HEART CATH;  Surgeon: Larey Dresser, MD;  Location: [place] [facility];  Service: Cardiovascular;  Laterality: N/A;   RIGHT/LEFT HEART CATH AND CORONARY ANGIOGRAPHY N/A 09/27/2017   Procedure: RIGHT/LEFT HEART CATH AND CORONARY ANGIOGRAPHY;  Surgeon: Sherren Mocha, MD;  Location: [place] [facility];  Service: Cardiovascular;  Laterality: N/A;   TEE WITHOUT CARDIOVERSION N/A 01/07/2021   Procedure: TRANSESOPHAGEAL ECHOCARDIOGRAM (TEE);  Surgeon: Larey Dresser, MD;  Location: [facility];  Service: Cardiovascular;  Laterality: N/A;   Social History   Social History Narrative   Not on file    Immunization History  Administered Date(s) Administered   Influenza-Unspecified 07/03/2019   Janssen (J&J) SARS-COV-2 Vaccination 01/13/2020   Moderna Sars-Covid-2 Vaccination 08/04/2020   Pneumococcal Conjugate-13 12/19/2014   Pneumococcal-Unspecified 07/13/2001, 09/28/2009   Td 04/12/2002   Tdap 05/09/2011     Objective: Vital Signs: BP 112/62 (BP Location: Right Arm, Patient Position: Sitting, Cuff Size: Normal)   Pulse 77   Ht 5' 5.5" (1.664 m)   Wt 154 lb (69.9 kg)   BMI 25.24 kg/m    Physical Exam HENT:     Right Ear: External ear normal.     Left Ear: External ear normal.  Cardiovascular:     Rate and Rhythm: Normal rate and regular rhythm.     Comments: Loud systolic murmur Pulmonary:     Effort: Pulmonary effort is normal.     Comments: Mild inspiratory crackles bilaterally Skin:    General: Skin is warm and dry.     Comments: Lipodermatosclerosis and hyperpigmentation of skin overlying distal shins and ankles  Neurological:     Mental Status: She is alert.  Psychiatric:        Mood and Affect: Mood normal.     Musculoskeletal Exam:  Neck full ROM no tenderness Shoulders mildly decreased extension and abduction no tenderness or swelling Elbows full ROM no tenderness or swelling  Wrists full ROM no tenderness or swelling Severe squaring of 1st CMC joints bilaterally, MCP bony enlargement with joint subluxation and lateral deviation with deceased extension ROM,  Knees full ROM no tenderness or swelling Ankles decreased ROM especially inversion and eversion 1st MTP bony enlargement buunions present with resultant cocked up 2nd toe deformity, callus present under right 1st MTP and on dorsum of 2nd PIP    Investigation: No additional findings.  Imaging: DG Ankle Complete Left  Result Date: 05/10/2021 CLINICAL DATA:  Chronic left ankle pain without known injury. EXAM: LEFT ANKLE COMPLETE - 3+ VIEW COMPARISON:  None. FINDINGS: There is no evidence of acute  fracture, dislocation, or joint effusion. There is no evidence of arthropathy or other focal bone abnormality. Vascular calcifications are noted. IMPRESSION: No acute abnormality is noted. Electronically Signed   By: Marijo Conception M.D.   On: 05/10/2021 12:34    Recent Labs: Lab Results  Component Value Date   WBC 11.7 (H) 05/10/2021   HGB 11.5 (L) 05/10/2021   PLT 276.0 05/10/2021   NA 138 05/11/2021   K 3.6 05/11/2021   CL 104 05/11/2021   CO2 21 (L) 05/11/2021   GLUCOSE 130 (H) 05/11/2021   BUN 51 (H) 05/11/2021   CREATININE 1.83 (H) 05/11/2021   BILITOT 0.4 01/26/2021   ALKPHOS 46 01/26/2021   AST 17 01/26/2021   ALT 9 01/26/2021   PROT 7.1 01/26/2021   ALBUMIN 4.1 01/26/2021   CALCIUM 8.9 05/11/2021   GFRAA 58 (L) 07/03/2020    Speciality Comments: No specialty comments available.  Procedures:  No procedures performed Allergies: Other, Adhesive [tape], and Nickel   Assessment / Plan:     Visit Diagnoses: Chronic gout due to renal impairment of multiple sites without tophus - Plan: HLA-B*58:01 Typing, Glucose 6 phosphate dehydrogenase  Symptoms do appear consistent with chronic nontophaceous gout SPECT increase in exacerbations related to her cardiac and renal comorbidities.  She does not appear to have ever been on a sustained successful urate lowering treatment plan, and past symptoms were not of a frequency to require this.  Allopurinol would still be recommended as first-line treatment but will require low-dose and frequent titration due to her renal function.  Febuxostat is an alternative option though with existing cardiovascular risk factors would not be a first-line choice.  No evidence of erosions or tophi so would not recommend Krystexxa as early choice the medication can be safe with renal disease she would not be able to take concurrent methotrexate which would probably limit the longevity of treatment. She will be at increased risk of complications with  allopurinol treatment we will check HLA-B*58:01 typing screening for risk of hypersensitivity reaction.  She could be candidate for [facility] treatment we will also check G6PD function.  Assuming looks okay would plan to start very low-dose allopurinol with monthly titration, probably no better short-term option for flares than prednisone at this time. Also discussed the concept of gout urate lowering therapy and purines in the diet with the patient with information provided on low purine diet recommendations.  Stage 3b chronic kidney disease ([place]) - Plan: HLA-B*58:01 Typing, Glucose 6 phosphate dehydrogenase  Previous creatinine in stage III the most recent labs from August estimated GFR of 28.  This can certainly drive gout symptoms due to decreased uric acid excretion.  Chronic diastolic heart failure (HCC)  Heart failure contribution to gout through loop diuretic use and narrow therapeutic window without kidney function exacerbation.  High risk medication use  All gout treatment options carry some increased risk in this case.  She has had a recent blood count and metabolic panel reviewed which appears stable though abnormal.   Orders: Orders Placed This Encounter  Procedures   HLA-B*58:01 Typing   Glucose 6 phosphate dehydrogenase    No orders of the defined types were placed in this encounter.   Follow-Up Instructions: No follow-ups on file.   Collier Salina, MD  Note - This record has been created using [employer].  Chart creation errors have been sought, but may not always  have been located. Such creation errors do not reflect on  the standard of medical care.

## 2021-05-14 ENCOUNTER — Encounter: Payer: Self-pay | Admitting: Internal Medicine

## 2021-05-14 ENCOUNTER — Other Ambulatory Visit: Payer: Self-pay

## 2021-05-14 ENCOUNTER — Ambulatory Visit: Payer: Medicare Other | Admitting: Internal Medicine

## 2021-05-14 VITALS — BP 112/62 | HR 77 | Ht 65.5 in | Wt 154.0 lb

## 2021-05-14 DIAGNOSIS — M1A39X0 Chronic gout due to renal impairment, multiple sites, without tophus (tophi): Secondary | ICD-10-CM | POA: Diagnosis not present

## 2021-05-14 DIAGNOSIS — I5032 Chronic diastolic (congestive) heart failure: Secondary | ICD-10-CM | POA: Diagnosis not present

## 2021-05-14 DIAGNOSIS — Z79899 Other long term (current) drug therapy: Secondary | ICD-10-CM | POA: Diagnosis not present

## 2021-05-14 DIAGNOSIS — N1832 Chronic kidney disease, stage 3b: Secondary | ICD-10-CM

## 2021-05-14 DIAGNOSIS — M109 Gout, unspecified: Secondary | ICD-10-CM | POA: Insufficient documentation

## 2021-05-14 NOTE — Patient Instructions (Signed)
Stuff Gout Gout is a condition that causes painful swelling of the joints. Gout is a type of inflammation of the joints (arthritis). This condition is caused by having too much uric acid in the body. Uric acid is a chemical that forms when the body breaks down substances called purines. Purines are important for building body proteins. When the body has too much uric acid, sharp crystals can form and build up inside the joints. This causes pain and swelling. Gout attacks can happen quickly and may be very painful (acute gout). Over time, the attacks can affect more joints and become more frequent (chronic gout). Gout can also cause uric acid to build up under the skin and inside the kidneys. What are the causes? This condition is caused by too much uric acid in your blood. This can happen because: Your kidneys do not remove enough uric acid from your blood. This is the most common cause. Your body makes too much uric acid. This can happen with some cancers and cancer treatments. It can also occur if your body is breaking down too many red blood cells (hemolytic anemia). You eat too many foods that are high in purines. These foods include organ meats and some seafood. Alcohol, especially beer, is also high in purines. A gout attack may be triggered by trauma or stress. What increases the risk? You are more likely to develop this condition if you: Have a family history of gout. Are female and middle-aged. Are female and have gone through menopause. Are obese. Frequently drink alcohol, especially beer. Are dehydrated. Lose weight too quickly. Have an organ transplant. Have lead poisoning. Take certain medicines, including aspirin, cyclosporine, diuretics, levodopa, and niacin. Have kidney disease. Have a skin condition called psoriasis. What are the signs or symptoms? An attack of acute gout happens quickly. It usually occurs in just one joint. The most common place is the big toe. Attacks often  start at night. Other joints that may be affected include joints of the feet, ankle, knee, fingers, wrist, or elbow. Symptoms of this condition may include: Severe pain. Warmth. Swelling. Stiffness. Tenderness. The affected joint may be very painful to touch. Shiny, red, or purple skin. Chills and fever. Chronic gout may cause symptoms more frequently. More joints may be involved. You may also have white or yellow lumps (tophi) on your hands or feet or in other areas near your joints. How is this diagnosed? This condition is diagnosed based on your symptoms, medical history, and physical exam. You may have tests, such as: Blood tests to measure uric acid levels. Removal of joint fluid with a thin needle (aspiration) to look for uric acid crystals. X-rays to look for joint damage. How is this treated? Treatment for this condition has two phases: treating an acute attack and preventing future attacks. Acute gout treatment may include medicines to reduce pain and swelling, including: NSAIDs. Steroids. These are strong anti-inflammatory medicines that can be taken by mouth (orally) or injected into a joint. Colchicine. This medicine relieves pain and swelling when it is taken soon after an attack. It can be given by mouth or through an IV. Preventive treatment may include: Daily use of smaller doses of NSAIDs or colchicine. Use of a medicine that reduces uric acid levels in your blood. Changes to your diet. You may need to see a dietitian about what to eat and drink to prevent gout. Follow these instructions at home: During a gout attack  If directed, put ice on the affected  area: Put ice in a plastic bag. Place a towel between your skin and the bag. Leave the ice on for 20 minutes, 2-3 times a day. Raise (elevate) the affected joint above the level of your heart as often as possible. Rest the joint as much as possible. If the affected joint is in your leg, you may be given crutches to  use. Follow instructions from your health care provider about eating or drinking restrictions. Avoiding future gout attacks Follow a low-purine diet as told by your dietitian or health care provider. Avoid foods and drinks that are high in purines, including liver, kidney, anchovies, asparagus, herring, mushrooms, mussels, and beer. Maintain a healthy weight or lose weight if you are overweight. If you want to lose weight, talk with your health care provider. It is important that you do not lose weight too quickly. Start or maintain an exercise program as told by your health care provider. Eating and drinking Drink enough fluids to keep your urine pale yellow. If you drink alcohol: Limit how much you use to: 0-1 drink a day for women. 0-2 drinks a day for men. Be aware of how much alcohol is in your drink. In the [place] one drink equals one 12 oz bottle of beer (355 mL) one 5 oz glass of wine (148 mL), or one 1 oz glass of hard liquor (44 mL). General instructions Take over-the-counter and prescription medicines only as told by your health care provider. Do not drive or use heavy machinery while taking prescription pain medicine. Return to your normal activities as told by your health care provider. Ask your health care provider what activities are safe for you. Keep all follow-up visits as told by your health care provider. This is important. Contact a health care provider if you have: Another gout attack. Continuing symptoms of a gout attack after 10 days of treatment. Side effects from your medicines. Chills or a fever. Burning pain when you urinate. Pain in your lower back or belly. Get help right away if you: Have severe or uncontrolled pain. Cannot urinate. Summary Gout is painful swelling of the joints caused by inflammation. The most common site of pain is the big toe, but it can affect other joints in the body. Medicines and dietary changes can help to prevent and treat gout  attacks.    Low-Purine Eating Plan A low-purine eating plan involves making food choices to limit your intake of purine. Purine is a kind of uric acid. Too much uric acid in your blood can cause certain conditions, such as gout and kidney stones. Eating a low-purine diet can help control these conditions. What are tips for following this plan? Reading food labels Avoid foods with saturated or Trans fat. Check the ingredient list of grains-based foods, such as bread and cereal, to make sure that they contain whole grains. Check the ingredient list of sauces or soups to make sure they do not contain meat or fish. When choosing soft drinks, check the ingredient list to make sure they do not contain high-fructose corn syrup. Shopping  Buy plenty of fresh fruits and vegetables. Avoid buying canned or fresh fish. Buy dairy products labeled as low-fat or nonfat. Avoid buying premade or processed foods. These foods are often high in fat, salt (sodium), and added sugar. Cooking Use olive oil instead of butter when cooking. Oils like olive oil, canola oil, and sunflower oil contain healthy fats. Meal planning Learn which foods do or do not affect you. If you  find out that a food tends to cause your gout symptoms to flare up, avoid eating that food. You can enjoy foods that do not cause problems. If you have any questions about a food item, talk with your dietitian or health care provider. Limit foods high in fat, especially saturated fat. Fat makes it harder for your body to get rid of uric acid. Choose foods that are lower in fat and are lean sources of protein. General guidelines Limit alcohol intake to no more than 1 drink a day for nonpregnant women and 2 drinks a day for men. One drink equals 12 oz of beer, 5 oz of wine, or 1 oz of hard liquor. Alcohol can affect the way your body gets rid of uric acid. Drink plenty of water to keep your urine clear or pale yellow. Fluids can help remove uric  acid from your body. If directed by your health care provider, take a vitamin C supplement. Work with your health care provider and dietitian to develop a plan to achieve or maintain a healthy weight. Losing weight can help reduce uric acid in your blood. What foods are recommended? The items listed may not be a complete list. Talk with your dietitian about what dietary choices are best for you. Foods low in purines Foods low in purines do not need to be limited. These include: All fruits. All low-purine vegetables, pickles, and olives. Breads, pasta, Alisha [name], cornbread, and popcorn. Cake and other baked goods. All dairy foods. Eggs, nuts, and nut butters. Spices and condiments, such as salt, herbs, and vinegar. Plant oils, butter, and margarine. Water, sugar-free soft drinks, tea, coffee, and cocoa. Vegetable-based soups, broths, sauces, and gravies. Foods moderate in purines Foods moderate in purines should be limited to the amounts listed.  cup of asparagus, cauliflower, spinach, mushrooms, or green peas, each day. 2/3 cup uncooked oatmeal, each day.  cup dry wheat bran or wheat germ, each day. 2-3 ounces of meat or poultry, each day. 4-6 ounces of shellfish, such as crab, lobster, oysters, or shrimp, each day. 1 cup cooked beans, peas, or lentils, each day. Soup, broths, or bouillon made from meat or fish. Limit these foods as much as possible. What foods are not recommended? The items listed may not be a complete list. Talk with your dietitian about what dietary choices are best for you. Limit your intake of foods high in purines, including: Beer and other alcohol. Meat-based gravy or sauce. Canned or fresh fish, such as: Anchovies, sardines, herring, and tuna. Mussels and scallops. Codfish, trout, and haddock. Alisha [name]. Organ meats, such as: Liver or kidney. Tripe. Sweetbreads (thymus gland or pancreas). Wild Clinical biochemist. Yeast or yeast extract supplements. Drinks  sweetened with high-fructose corn syrup. Summary Eating a low-purine diet can help control conditions caused by too much uric acid in the body, such as gout or kidney stones. Choose low-purine foods, limit alcohol, and limit foods high in fat. You will learn over time which foods do or do not affect you. If you find out that a food tends to cause your gout symptoms to flare up, avoid eating that food.

## 2021-05-20 LAB — HLA-B*58:01 TYPING: HLA-B*58:01 Typing: NEGATIVE

## 2021-05-20 LAB — GLUCOSE 6 PHOSPHATE DEHYDROGENASE: G-6PDH: 19.7 U/g Hgb (ref 7.0–20.5)

## 2021-05-21 ENCOUNTER — Encounter (HOSPITAL_COMMUNITY): Payer: Self-pay

## 2021-05-22 ENCOUNTER — Other Ambulatory Visit (HOSPITAL_COMMUNITY): Payer: Self-pay | Admitting: Cardiology

## 2021-05-24 NOTE — Progress Notes (Signed)
ID:  Alisha [name], DOB [birth year], MRN [id number]   Provider location: [facility] Type of Visit: Established patient   PCP:  Mackie Pai, PA-C  Cardiologist:  Dr. Aundra Dubin   History of Present Illness: Alisha [name] is a 80 y.o. female who has a history of diabetes, HTN, PAD, hyperlipidemia. She was referred by Dr. Wynonia Lawman for evaluation of pulmonary hypertension.    For > 1 year, she has had significant exertional dyspnea. It has been steadily worsening, especially over the last few months.  She has been extensively worked up so far.  Echo in 4/18 showed preserved EF 65% with moderate pulmonary hypertension.  She had episodes of syncope in 5/18 and 10/18.  She wore an event monitor in 5/18 with no significant arrhythmia.  LHC/RHC in 1/19 showed normal PCWP and severely elevated PA pressure, no significant CAD.     At initial appointment in 1/19, she was noted to be hypoxemic with exertion and home oxygen was started for use with exertion.  I also started her on Opsumit. She had to stop this after about a week due to significantly increased exertional peripheral edema.  This resolved after Opsumit was stopped.    Echo in 4/19.  This showed EF 60-65%, mild MS, mild AS, moderately dilated RV with mildly decreased systolic function.    She was found to have Fe deficiency anemia, FOBT+.  EGD showed gastritis and c-scope showed diverticulosis and polyps in 4/19.  She get IV Fe.  She is going to have a capsule endoscopy.    She tried to start ambrisentan, but developed worsening dyspnea and peripheral edema and low oxygen saturation.  This improved after ambrisentan was stopped.    She is now taking riociguat and Uptravi.  RHC in 4/20 showed normal filling pressures but severe pulmonary hypertension with PVR 6 WU.  Echo showed moderately dilated RV with normal systolic function, mild-moderate MS, mild AS.  Alisha [name] has now been titrated up to 2400 mcg bid and riociguat  to 1.5 mg tid.    She had COVID-19 PNA in 2/21, hospitalized for about a week.   Echo in 4/21 showed EF 57-32%, grade 2 diastolic dysfunction, normal RV, PASP 50 mmHg, probably mild mitral stenosis with mean gradient 7 mmHg and MVA 2 cm^2 by PHT.  Echo in 4/22 showed EF 60-65%, moderate LVH, moderate AS with mean gradient 27 mmHg and AVA 1.06 cm^2, moderate mitral stenosis with mean gradient 7 and MVA 1.2 cm^2, normal RV size and systolic function, PASP 41 mmHg.  TEE was done in 4/22 to more closely assess the mitral and aortic valves, EF 60-65%, d-shaped septum, mild RV enlargement with mildly decreased RV systolic function, peak RV-RA gradient 38 mmHg, moderate AS with AVA 1.31 cm^2 mean gradient 26 mmHg, mild-moderate MR with mild-moderate MS mean gradient 7 with MVA 1.7 cm^2 by VTI .  Returned for follow up 8/22 and was volume overloaded. Lasix stopped and torsemide started 60 mg bid. Amlodipine increased to 10. Instructed to wear oxygen with exertion.  She returns for follow up with CHF. At last follow up she was instructed to stop lasix and start torsemide 60 mg bid. She has been taking torsemide 20 mg tid. She has not been wearing oxygen consistently for last 2 years, however she went out to lunch last week and felt she needed it. Her pulse Ox was 83-85%, she put on 2L oxygen and increased to 92-95%. Feels swelling has improved  since last visit, but not resolved. Breathing feels worse. Having loose dark stools for the past couple of weeks. Poor energy right now. Taking all medicines. Weight at home 157-160 lbs. Took metolazone Fri and Sat last week after eating chicken terryaki out.   ECG (personally reviewed): none ordered today.  6 minute walk (1/19): 92 m, oxygen saturation dropped to 70%.   6 minute walk (3/19): 122 m 6 minute walk (4/19): 392 m 6 minute walk (9/19): 317 m 6 minute walk (1/20): 320 m 6 minute walk (8/20): 359 m 6 minute walk (10/20): 391 m 6 minute walk (9/21): 335 m,  oxygen saturation 90-96% 6 minute walk (1/22): 305 m 6 minute walk (8/22): 311 m, oxygen saturation dropped as low at 79%   Labs (1/19): anti-Jo1 negative, RF borderline positive, SCL-70 negative, HIV negative. ESR 21. BNP 1129.  Labs (3/19): ANA + => anti-dsDNA negative, SSA/SSB negative, anti-Sm negative, anti-RNP negative. K 3.8 => 3.7, creatinine 1.4 => 1.55.  Labs (4/19): K 4.1 => 3.1, creatinine 1.58 => 1.2, BNP 1902, CCP Ab negative.  Labs (6/19): hgb 9, creatinine 1.66, K 3 Labs (7/19): K 4.9, creatinine 1.7 Labs (8/19): K 3.6, creatinine 1.36 Labs (9/19): K 4.1, creatinine 1.55, BNP 641 Labs (10/19): K 4.7, creatinine 1.88, BNP 595 Labs (1/20): Hgb 12.2 Labs (4/20): K 4.2, creatinine 2.2, hgb 8.4 Labs (7/20): K 3.2, creatinine 2.07, hgb 8 Labs (8/20): K 3.7, creatinine 1.46 Labs (9/20): K 4, creatinine 1.5 Labs (10/20): K 4.5, creatinine 1.75 Labs (2/21): K 4.1, creatinine 1.78 Labs (7/21): K 3.7, creatinine 1.22, hgb 12.2 Labs (1/22): TSH normal, Mg 2.3, creatinine 1.36, K 3, hgb 12.4, BNP 342 Labs (2/22): LDL 66 Labs (3/22): K 3.6, creatinine 1.02 Labs (5/22): K 4.4, creatinine 1.62, BNP 823 Labs (8/22): K 3.6, creatinine 1.83   PMH: 1. PAD: Non-healing left ankle ulcer.  - ABIs 9/12 were normal.  2. Type 2 diabetes 3. Hypothyroidism 4. CKD stage 3 5. Hyperlipidemia 6. HTN 7. IBS 8. OSA: on CPAP.  9. Event monitor 5/18: No significant abnormality.  10. DVT in 1995 11. Pulmonary hypertension: Echo (4/18) with EF 65%, mild MR, mild TR, mild mitral stenosis, moderate pulmonary hypertension. - CTA chest 10/17: No PE.  - RHC (1/19): mean RA 11, PA 87/31 mean 51, mean PCWP 6, CI 3.58, PVR 7 WU.  - PFTs (1/19): mild obstruction, some restriction => severely decreased DLCO. - High resolution CT chest (1/19): no interstitial lung disease or emphysema. 7 mm nodule RUL.  - anti-Jo1 negative, RF borderline positive, SCL-70 negative, HIV negative. ANA + => anti-dsDNA  negative, SSA/SSB negative, anti-Sm negative, anti-RNP negative. - V/Q scan 2/19: No e/o chronic PE.  - Echo (4/19): EF 60-65%, mild LVH, mild aortic stenosis, mild mitral stenosis, moderately dilated RV with mildly decreased systolic function, PASP 36 mmHg (poor envelope, may be underestimated).  - Does not tolerate ERAs (macitentan, ambrisentan).  - RHC (4/20): mean RA 5, PA 85/22 mean 51, mean PCWP 12, CI  3.5, PVR 6 WU.  - Echo (4/20): EF > 65%, moderate LVH, RV moderately dilated with normal systolic function, moderate pericardial effusion, mild-moderate mitral stenosis with mean gradient 7/MVA by PHT 2.48 cm^2, mild AS mean gradient 12.  - Echo (4/21): EF 53-97%, grade 2 diastolic dysfunction, normal RV, PASP 50 mmHg, probably mild mitral stenosis with mean gradient 7 mmHg and MVA 2 cm^2 by PHT, mild AS.  - Echo (4/22): EF 60-65%, moderate LVH, moderate AS with mean  gradient 27 mmHg and AVA 1.06 cm^2, moderate mitral stenosis with mean gradient 7 and MVA 1.2 cm^2, normal RV size and systolic function, PASP 41 mmHg.  - TEE (4/22): EF 60-65%, d-shaped septum, mild RV enlargement with mildly decreased RV systolic function, peak RV-RA gradient 38 mmHg, moderate AS with AVA 1.31 cm^2 mean gradient 26 mmHg, mild-moderate MR with mild-moderate MS mean gradient 7 with MVA 1.7 cm^2 by VTI . 12. Coronary angiography 11/18 without significant disease.  - Coronary angiography in 1/19 with luminal irregularities 13. Mitral stenosis: Mild by cath in 1/19, mean gradient 2.6 mmHg. Mild on 4/19 echo.  - Mild-moderate by echo in 4/20.  - Mild by echo in 4/21.  - Moderate by echo in 4/22.  - Mild-moderate by TEE in 4/22.  14. Aortic stenosis: Mild on 4/19 echo.  - Mild on 4/20 echo.  - Mild on 4/21 echo.  - Moderate on 4/22 echo.  - Moderate on 4/22 TEE 15. Fe deficiency anemia: EGD showed gastritis and c-scope showed diverticulosis and polyps in 4/19. Capsule endoscopy was unrevealing.  16. Pulmonary  nodule: CT chest 7/19 with RUL nodule.  17. Low back pain/sciatica 18. COVID-19 PNA in 2/21.   Current Outpatient Medications  Medication Sig Dispense Refill   ADEMPAS 2.5 MG TABS TAKE 1 TABLET THREE TIMES A DAY 90 tablet 11   amLODipine (NORVASC) 10 MG tablet Take 1 tablet (10 mg total) by mouth daily. 90 tablet 3   atorvastatin (LIPITOR) 20 MG tablet TAKE 1 TABLET BY MOUTH IN  THE MORNING 90 tablet 3   cetirizine (ZYRTEC) 10 MG tablet Take 10 mg by mouth daily as needed for allergies.     fenofibrate 160 MG tablet Take 1 tablet (160 mg total) by mouth every morning. 30 tablet 2   ferrous sulfate 325 (65 FE) MG tablet Take 1 tablet (325 mg total) by mouth daily with breakfast. Morning and night 30 tablet 0   glucose blood (ACCU-CHEK AVIVA PLUS) test strip Check blood sugar TID dx: E11.9 100 each 12   levothyroxine (SYNTHROID) 125 MCG tablet Take 1 tablet (125 mcg total) by mouth daily before breakfast. 30 tablet 2   loperamide (IMODIUM A-D) 2 MG tablet Take 2 mg by mouth daily as needed for diarrhea or loose stools.     loratadine (CLARITIN) 10 MG tablet Take 10 mg by mouth daily as needed for allergies.      metFORMIN (GLUCOPHAGE) 500 MG tablet Take 1 tablet (500 mg total) by mouth 2 (two) times daily with a meal. (Patient taking differently: Take 500 mg by mouth. Patient takes once a day unless her blood sugar reading is 180 or higher.) 180 tablet 1   metFORMIN (GLUCOPHAGE) 500 MG tablet Take 500 mg by mouth. Patient takes 1 tablet a day unless her blood sugar reading is 180 or higher.     metolazone (ZAROXOLYN) 2.5 MG tablet Take 1 tablet (2.5 mg total) by mouth once a week. Take potassium 59mq with this 4 tablet 6   metoprolol succinate (TOPROL XL) 25 MG 24 hr tablet Take 1 tablet (25 mg total) by mouth 2 (two) times daily. 60 tablet 11   Multiple Vitamins-Minerals (PRESERVISION AREDS 2 PO) Take 1 capsule by mouth 2 (two) times daily. As needed     pantoprazole (PROTONIX) 40 MG tablet TAKE  1 TABLET BY MOUTH  TWICE DAILY 180 tablet 3   potassium chloride (KLOR-CON) 10 MEQ tablet TAKE 1 TABLET (10 MEQ TOTAL) BY MOUTH  DAILY. TAKE AN EXTRA 20MEQ (2 TABLETS) EVERY WEEK WITH METOLAZONE (Patient taking differently: Take 10 mEq by mouth daily.) 114 tablet 1   torsemide (DEMADEX) 20 MG tablet Take 3 tablets (60 mg total) by mouth 2 (two) times daily. 180 tablet 11   traMADol (ULTRAM) 50 MG tablet Take 1 tablet (50 mg total) by mouth every 6 (six) hours as needed for moderate pain or severe pain. 8 tablet 0   UPTRAVI 1600 MCG TABS Take 1 tablet (1,600 mcg total) by mouth 2 (two) times daily. Take with 800 for a total of 2400 mg 60 tablet 11   UPTRAVI 800 MCG TABS Take 1 tablet (800 mcg total) by mouth 2 (two) times daily. Take with 1600 mcg for a total of 2400 mg 60 tablet 11   No current facility-administered medications for this encounter.    Allergies:   Other, Adhesive [tape], and Nickel   Social History:  The patient  reports that she has never smoked. She has never used smokeless tobacco. She reports current alcohol use. She reports that she does not use drugs.   Family History:  The patient's family history includes Alzheimer's disease in her father; Bleeding Disorder in her mother; Diabetes in her brother, mother, and sister; Healthy in her daughter and son; Heart disease in her brother and father.   ROS:  Please see the history of present illness.   All other systems are personally reviewed and negative.   Exam:   BP 112/62   Pulse 80   Wt 74.7 kg (164 lb 9.6 oz)   SpO2 97%   BMI 26.97 kg/m   General:  NAD. No resp difficulty HEENT: Normal Neck: Supple. JVP 7-8. Carotids 2+ bilat; no bruits. No lymphadenopathy or thryomegaly appreciated. Cor: PMI nondisplaced. Regular rate & rhythm. No rubs, gallops, 3/6 SEM RUSB Lungs: Clear Abdomen: Soft, nontender, nondistended. No hepatosplenomegaly. No bruits or masses. Good bowel sounds. Extremities: No cyanosis, clubbing, rash, 1+  BLE edema Neuro: Alert & oriented x 3, cranial nerves grossly intact. Moves all 4 extremities w/o difficulty. Affect pleasant.  Recent Labs: 09/18/2020: Magnesium 2.3; TSH 0.568 01/26/2021: ALT 9; Pro B Natriuretic peptide (BNP) 823.0 04/27/2021: B Natriuretic Peptide 784.9 05/10/2021: Hemoglobin 11.5; Platelets 276.0 05/11/2021: BUN 51; Creatinine, Ser 1.83; Potassium 3.6; Sodium 138  Personally reviewed   Wt Readings from Last 3 Encounters:  05/26/21 74.7 kg (164 lb 9.6 oz)  05/14/21 69.9 kg (154 lb)  05/10/21 71.8 kg (158 lb 6.4 oz)    ASSESSMENT AND PLAN:  1. Pulmonary hypertension with RV failure:  Severe PAH with PVR 7 WU on 1/19 RHC.  Etiology uncertain.  She has a history of DVT remotely but V/Q scan in 2/19 did not show evidence for chronic PE. She has OSA and uses CPAP.  Serologic workup showed only positive ANA but when additional serologies were done, they were all negative (see above).  CT chest with no ILD or emphysema.  Valvular heart disease does not appear significant enough to cause this degree of PH.  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  Echo in 4/19 showed that the RV is moderately dilated with mildly decreased systolic function, PA systolic pressure estimation was only 36 mmHg but poor envelope and likely underestimated. She did not tolerate Opsumit or ambrisentan due to significant lower extremity edema and worsening dyspnea.  [place] in 4/20 showed severe pulmonary hypertension with normal filling pressures and preserved cardiac output.  Echo in 4/21 showed normal RV with PASP 50 mmHg,  echo 4/22 showed normal RV with PASP estimated 41 mmHg. TEE in 4/22 showed D-shaped septum with mild RV enlargement/mildly decreased RV systolic function.  She is volume overloaded on exam though 6 minute walk is stable.   - Continue Adempas, she is at goal.  Alisha [name] is up to 2400 mcg BID.   - She needs to use oxygen with exertion.  2. Chronic diastolic CHF: In setting of pulmonary hypertension and valvular  disease.  Worsened symptoms, NYHA class III. She is mildly volume overloaded on exam, ReDs 35%. - Instructed to take torsemide 60 mg bid. BMET & BNP today, repeat BMET in 10 days. - She will continue metolazone once weekly on Saturdays.  3. Syncope: Suspect related to severe pulmonary hypertension.  None recently.   4. Mitral stenosis: Noted on echo and cath.  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  With heavy calcification, I do not think that she would be a mitral valvuloplasty candidate.  - Would not consider for surgery at this point (she would be high risk).  5. H/o DVT: No chronic PE on prior V/Q scan.   6. HTN: Controlled today. Continue current medications. 7. OSA: Continue CPAP qHS.  8. CKD: Stage 3.  BMET today.   9. Aortic stenosis: Moderate by 4/22 TEE.  She would be a TAVR candidate if AS severe, but could not percutaneously fix the mitral valve so may need to consider surgical MVR/AVR in the future.   10. H/o GI bleeding: Slow chronic GI bleed, source has not been found despite extensive workup.  - Now with dark stools. She is followed by Heme/Onc for IDA. - CBC today.   Followup in 3 wks with APP to reassess fluid status.  Signed, Rafael Bihari, FNP  05/26/2021  [facility] [address] [facility] [place] [address] [phone] (office) [phone] (fax)

## 2021-05-26 ENCOUNTER — Encounter (HOSPITAL_COMMUNITY): Payer: Self-pay

## 2021-05-26 ENCOUNTER — Other Ambulatory Visit: Payer: Self-pay

## 2021-05-26 ENCOUNTER — Ambulatory Visit (HOSPITAL_COMMUNITY)
Admission: RE | Admit: 2021-05-26 | Discharge: 2021-05-26 | Disposition: A | Discharge status: Home / Self Care | Payer: Medicare Other | Source: Ambulatory Visit | Attending: Family Medicine | Admitting: Family Medicine

## 2021-05-26 ENCOUNTER — Other Ambulatory Visit (HOSPITAL_BASED_OUTPATIENT_CLINIC_OR_DEPARTMENT_OTHER): Payer: Self-pay

## 2021-05-26 ENCOUNTER — Encounter: Payer: Self-pay | Admitting: Internal Medicine

## 2021-05-26 VITALS — BP 112/62 | HR 80 | Wt 164.6 lb

## 2021-05-26 DIAGNOSIS — N183 Chronic kidney disease, stage 3 unspecified: Secondary | ICD-10-CM | POA: Diagnosis not present

## 2021-05-26 DIAGNOSIS — E1151 Type 2 diabetes mellitus with diabetic peripheral angiopathy without gangrene: Secondary | ICD-10-CM | POA: Insufficient documentation

## 2021-05-26 DIAGNOSIS — E1122 Type 2 diabetes mellitus with diabetic chronic kidney disease: Secondary | ICD-10-CM | POA: Diagnosis not present

## 2021-05-26 DIAGNOSIS — G4733 Obstructive sleep apnea (adult) (pediatric): Secondary | ICD-10-CM | POA: Diagnosis not present

## 2021-05-26 DIAGNOSIS — Z7984 Long term (current) use of oral hypoglycemic drugs: Secondary | ICD-10-CM | POA: Insufficient documentation

## 2021-05-26 DIAGNOSIS — Z79899 Other long term (current) drug therapy: Secondary | ICD-10-CM | POA: Insufficient documentation

## 2021-05-26 DIAGNOSIS — M1A39X0 Chronic gout due to renal impairment, multiple sites, without tophus (tophi): Secondary | ICD-10-CM

## 2021-05-26 DIAGNOSIS — I5032 Chronic diastolic (congestive) heart failure: Secondary | ICD-10-CM

## 2021-05-26 DIAGNOSIS — I350 Nonrheumatic aortic (valve) stenosis: Secondary | ICD-10-CM

## 2021-05-26 DIAGNOSIS — Z8249 Family history of ischemic heart disease and other diseases of the circulatory system: Secondary | ICD-10-CM | POA: Diagnosis not present

## 2021-05-26 DIAGNOSIS — I130 Hypertensive heart and chronic kidney disease with heart failure and stage 1 through stage 4 chronic kidney disease, or unspecified chronic kidney disease: Secondary | ICD-10-CM | POA: Insufficient documentation

## 2021-05-26 DIAGNOSIS — Z8719 Personal history of other diseases of the digestive system: Secondary | ICD-10-CM

## 2021-05-26 DIAGNOSIS — E785 Hyperlipidemia, unspecified: Secondary | ICD-10-CM | POA: Diagnosis not present

## 2021-05-26 DIAGNOSIS — N1832 Chronic kidney disease, stage 3b: Secondary | ICD-10-CM

## 2021-05-26 DIAGNOSIS — Z888 Allergy status to other drugs, medicaments and biological substances status: Secondary | ICD-10-CM | POA: Insufficient documentation

## 2021-05-26 DIAGNOSIS — R55 Syncope and collapse: Secondary | ICD-10-CM

## 2021-05-26 DIAGNOSIS — Z833 Family history of diabetes mellitus: Secondary | ICD-10-CM | POA: Diagnosis not present

## 2021-05-26 DIAGNOSIS — I272 Pulmonary hypertension, unspecified: Secondary | ICD-10-CM

## 2021-05-26 DIAGNOSIS — Z86718 Personal history of other venous thrombosis and embolism: Secondary | ICD-10-CM

## 2021-05-26 DIAGNOSIS — I050 Rheumatic mitral stenosis: Secondary | ICD-10-CM

## 2021-05-26 DIAGNOSIS — I10 Essential (primary) hypertension: Secondary | ICD-10-CM

## 2021-05-26 DIAGNOSIS — Z8616 Personal history of COVID-19: Secondary | ICD-10-CM | POA: Insufficient documentation

## 2021-05-26 LAB — BASIC METABOLIC PANEL
Anion gap: 13 (ref 5–15)
BUN: 47 mg/dL — ABNORMAL HIGH (ref 8–23)
CO2: 22 mmol/L (ref 22–32)
Calcium: 9.2 mg/dL (ref 8.9–10.3)
Chloride: 103 mmol/L (ref 98–111)
Creatinine, Ser: 1.81 mg/dL — ABNORMAL HIGH (ref 0.44–1.00)
GFR, Estimated: 28 mL/min — ABNORMAL LOW (ref >60)
Glucose, Bld: 145 mg/dL — ABNORMAL HIGH (ref 70–99)
Potassium: 3.6 mmol/L (ref 3.5–5.1)
Sodium: 138 mmol/L (ref 135–145)

## 2021-05-26 LAB — CBC
HCT: 35.1 % — ABNORMAL LOW (ref 36.0–46.0)
Hemoglobin: 10.8 g/dL — ABNORMAL LOW (ref 12.0–15.0)
MCH: 27.1 pg (ref 26.0–34.0)
MCHC: 30.8 g/dL (ref 30.0–36.0)
MCV: 88.2 fL (ref 80.0–100.0)
Platelets: 312 10*3/uL (ref 150–400)
RBC: 3.98 MIL/uL (ref 3.87–5.11)
RDW: 16.9 % — ABNORMAL HIGH (ref 11.5–15.5)
WBC: 8.7 10*3/uL (ref 4.0–10.5)
nRBC: 0 % (ref 0.0–0.2)

## 2021-05-26 LAB — BRAIN NATRIURETIC PEPTIDE: B Natriuretic Peptide: 977.8 pg/mL — ABNORMAL HIGH (ref 0.0–100.0)

## 2021-05-26 MED ORDER — ALLOPURINOL 100 MG PO TABS: 100.0000 mg | ORAL_TABLET | Freq: Every day | ORAL | 0 refills | Status: DC

## 2021-05-26 NOTE — Patient Instructions (Signed)
RedsClip done today.  Labs done today. We will contact you only if your labs are abnormal.  Please take your Torsemide 60mg  (3 tablets) by mouth 2 times daily.   No other medication changes were made. Please continue all current medications as prescribed.  Your physician recommends that you schedule a follow-up appointment in: 10 days for a lab only appointment,3 weeks with our [facility] here in our office and in 3 months with Dr. Aundra Dubin.  If you have any questions or concerns before your next appointment please send [place] a message through [place] or call our office at [phone].    TO LEAVE A MESSAGE FOR THE NURSE SELECT OPTION 2, PLEASE LEAVE A MESSAGE INCLUDING: YOUR NAME DATE OF BIRTH CALL BACK NUMBER REASON FOR CALL**this is important as we prioritize the call backs  YOU WILL RECEIVE A CALL BACK THE SAME DAY AS LONG AS YOU CALL BEFORE 4:00 PM   Do the following things EVERYDAY: Weigh yourself in the morning before breakfast. Write it down and keep it in a log. Take your medicines as prescribed Eat low salt foods--Limit salt (sodium) to 2000 mg per day.  Stay as active as you can everyday Limit all fluids for the day to less than 2 liters   At the [facility], you and your health needs are our priority. As part of our continuing mission to provide you with exceptional heart care, we have created designated Provider Care Teams. These Care Teams include your primary Cardiologist (physician) and Advanced Practice Providers (APPs- Physician Assistants and Nurse Practitioners) who all work together to provide you with the care you need, when you need it.   You may see any of the following providers on your designated Care Team at your next follow up: Dr Glori Bickers Dr Haynes Kerns, NP Lyda Jester, [place] Audry Riles, PharmD   Please be sure to bring in all your medications bottles to every appointment.

## 2021-05-26 NOTE — Progress Notes (Signed)
ReDS Vest / Clip - 05/26/21 1200       ReDS Vest / Clip   Station Marker A    Ruler Value 26.5    ReDS Value Range Low volume    ReDS Actual Value 35

## 2021-05-26 NOTE — Telephone Encounter (Signed)
Her labs we checked were negative for high risk features and her recent blood test with cardiology shows stable or improving kidney function numbers. I am sending Rx to [facility] for 100 mg 1 tablet daily.

## 2021-05-27 ENCOUNTER — Encounter (HOSPITAL_COMMUNITY): Payer: Self-pay

## 2021-06-01 ENCOUNTER — Encounter (HOSPITAL_COMMUNITY): Payer: Self-pay

## 2021-06-03 ENCOUNTER — Other Ambulatory Visit: Payer: Self-pay | Admitting: Medical

## 2021-06-05 ENCOUNTER — Encounter: Payer: Self-pay | Admitting: Medical

## 2021-06-08 ENCOUNTER — Other Ambulatory Visit: Payer: Self-pay

## 2021-06-08 ENCOUNTER — Ambulatory Visit (HOSPITAL_COMMUNITY)
Admission: RE | Admit: 2021-06-08 | Discharge: 2021-06-08 | Disposition: A | Discharge status: Home / Self Care | Payer: Medicare Other | Source: Ambulatory Visit | Attending: Internal Medicine | Admitting: Internal Medicine

## 2021-06-08 ENCOUNTER — Telehealth (HOSPITAL_COMMUNITY): Payer: Self-pay | Admitting: Surgery

## 2021-06-08 DIAGNOSIS — I5032 Chronic diastolic (congestive) heart failure: Secondary | ICD-10-CM

## 2021-06-08 LAB — BASIC METABOLIC PANEL
Anion gap: 13 (ref 5–15)
BUN: 60 mg/dL — ABNORMAL HIGH (ref 8–23)
CO2: 22 mmol/L (ref 22–32)
Calcium: 9.2 mg/dL (ref 8.9–10.3)
Chloride: 101 mmol/L (ref 98–111)
Creatinine, Ser: 1.91 mg/dL — ABNORMAL HIGH (ref 0.44–1.00)
GFR, Estimated: 27 mL/min — ABNORMAL LOW (ref >60)
Glucose, Bld: 172 mg/dL — ABNORMAL HIGH (ref 70–99)
Potassium: 3 mmol/L — ABNORMAL LOW (ref 3.5–5.1)
Sodium: 136 mmol/L (ref 135–145)

## 2021-06-08 NOTE — Telephone Encounter (Signed)
-----   Message from Rafael Bihari, [place] sent at 06/08/2021  2:31 PM EDT ----- Potassium is low. Please increase daily KCl supp by 20 mEq and make sure she is taking extra 20 with metolazone doses. Will need repeat BMET in 1 week please

## 2021-06-08 NOTE — Telephone Encounter (Signed)
I called patient and she informs me that she does not take potassium as ordered because she struggles to swallow it and it causes her to gag.  I reviewed methods/ options to help her get it down.  She tells me that she will try harder to take the Potassium as ordered.  Per Allena Katz NP will not make changes to Potassium dosage today given the fact that patient is not taking prescribed amount.  I scheduled a follow-up lab appt next week.

## 2021-06-14 ENCOUNTER — Encounter: Payer: Self-pay | Admitting: Internal Medicine

## 2021-06-14 NOTE — Telephone Encounter (Signed)
I called [facility], medication delivered to patient's home 05/31/2021, I called patient, patient will call [facility] to track medication.

## 2021-06-15 ENCOUNTER — Other Ambulatory Visit (HOSPITAL_COMMUNITY): Payer: Medicare Other

## 2021-06-18 ENCOUNTER — Other Ambulatory Visit: Payer: Self-pay

## 2021-06-18 ENCOUNTER — Encounter (HOSPITAL_COMMUNITY): Payer: Self-pay

## 2021-06-18 ENCOUNTER — Ambulatory Visit (HOSPITAL_COMMUNITY)
Admission: RE | Admit: 2021-06-18 | Discharge: 2021-06-18 | Disposition: A | Discharge status: Home / Self Care | Payer: Medicare Other | Source: Ambulatory Visit | Attending: Physician Assistant | Admitting: Physician Assistant

## 2021-06-18 VITALS — BP 114/55 | HR 86 | Wt 155.0 lb

## 2021-06-18 DIAGNOSIS — Z7984 Long term (current) use of oral hypoglycemic drugs: Secondary | ICD-10-CM | POA: Insufficient documentation

## 2021-06-18 DIAGNOSIS — I272 Pulmonary hypertension, unspecified: Secondary | ICD-10-CM | POA: Insufficient documentation

## 2021-06-18 DIAGNOSIS — Z8616 Personal history of COVID-19: Secondary | ICD-10-CM | POA: Diagnosis not present

## 2021-06-18 DIAGNOSIS — I130 Hypertensive heart and chronic kidney disease with heart failure and stage 1 through stage 4 chronic kidney disease, or unspecified chronic kidney disease: Secondary | ICD-10-CM | POA: Insufficient documentation

## 2021-06-18 DIAGNOSIS — Z79899 Other long term (current) drug therapy: Secondary | ICD-10-CM | POA: Diagnosis not present

## 2021-06-18 DIAGNOSIS — Z79890 Hormone replacement therapy (postmenopausal): Secondary | ICD-10-CM | POA: Insufficient documentation

## 2021-06-18 DIAGNOSIS — D500 Iron deficiency anemia secondary to blood loss (chronic): Secondary | ICD-10-CM | POA: Diagnosis not present

## 2021-06-18 DIAGNOSIS — E1122 Type 2 diabetes mellitus with diabetic chronic kidney disease: Secondary | ICD-10-CM | POA: Insufficient documentation

## 2021-06-18 DIAGNOSIS — Z86718 Personal history of other venous thrombosis and embolism: Secondary | ICD-10-CM | POA: Insufficient documentation

## 2021-06-18 DIAGNOSIS — N183 Chronic kidney disease, stage 3 unspecified: Secondary | ICD-10-CM | POA: Insufficient documentation

## 2021-06-18 DIAGNOSIS — I080 Rheumatic disorders of both mitral and aortic valves: Secondary | ICD-10-CM | POA: Diagnosis not present

## 2021-06-18 DIAGNOSIS — Z888 Allergy status to other drugs, medicaments and biological substances status: Secondary | ICD-10-CM | POA: Diagnosis not present

## 2021-06-18 DIAGNOSIS — Z8719 Personal history of other diseases of the digestive system: Secondary | ICD-10-CM | POA: Insufficient documentation

## 2021-06-18 DIAGNOSIS — E1151 Type 2 diabetes mellitus with diabetic peripheral angiopathy without gangrene: Secondary | ICD-10-CM | POA: Insufficient documentation

## 2021-06-18 DIAGNOSIS — Z9989 Dependence on other enabling machines and devices: Secondary | ICD-10-CM | POA: Diagnosis not present

## 2021-06-18 DIAGNOSIS — G4733 Obstructive sleep apnea (adult) (pediatric): Secondary | ICD-10-CM | POA: Diagnosis not present

## 2021-06-18 DIAGNOSIS — I50810 Right heart failure, unspecified: Secondary | ICD-10-CM | POA: Diagnosis not present

## 2021-06-18 DIAGNOSIS — Z8249 Family history of ischemic heart disease and other diseases of the circulatory system: Secondary | ICD-10-CM | POA: Diagnosis not present

## 2021-06-18 DIAGNOSIS — I350 Nonrheumatic aortic (valve) stenosis: Secondary | ICD-10-CM

## 2021-06-18 DIAGNOSIS — I5032 Chronic diastolic (congestive) heart failure: Secondary | ICD-10-CM | POA: Diagnosis not present

## 2021-06-18 DIAGNOSIS — I10 Essential (primary) hypertension: Secondary | ICD-10-CM

## 2021-06-18 DIAGNOSIS — E785 Hyperlipidemia, unspecified: Secondary | ICD-10-CM | POA: Insufficient documentation

## 2021-06-18 LAB — BASIC METABOLIC PANEL
Anion gap: 10 (ref 5–15)
BUN: 34 mg/dL — ABNORMAL HIGH (ref 8–23)
CO2: 20 mmol/L — ABNORMAL LOW (ref 22–32)
Calcium: 8.4 mg/dL — ABNORMAL LOW (ref 8.9–10.3)
Chloride: 110 mmol/L (ref 98–111)
Creatinine, Ser: 1.65 mg/dL — ABNORMAL HIGH (ref 0.44–1.00)
GFR, Estimated: 32 mL/min — ABNORMAL LOW (ref >60)
Glucose, Bld: 158 mg/dL — ABNORMAL HIGH (ref 70–99)
Potassium: 3.4 mmol/L — ABNORMAL LOW (ref 3.5–5.1)
Sodium: 140 mmol/L (ref 135–145)

## 2021-06-18 LAB — CBC
HCT: 30.5 % — ABNORMAL LOW (ref 36.0–46.0)
Hemoglobin: 9.3 g/dL — ABNORMAL LOW (ref 12.0–15.0)
MCH: 27.4 pg (ref 26.0–34.0)
MCHC: 30.5 g/dL (ref 30.0–36.0)
MCV: 90 fL (ref 80.0–100.0)
Platelets: 269 10*3/uL (ref 150–400)
RBC: 3.39 MIL/uL — ABNORMAL LOW (ref 3.87–5.11)
RDW: 19.3 % — ABNORMAL HIGH (ref 11.5–15.5)
WBC: 8.2 10*3/uL (ref 4.0–10.5)
nRBC: 0 % (ref 0.0–0.2)

## 2021-06-18 LAB — BRAIN NATRIURETIC PEPTIDE: B Natriuretic Peptide: 986.2 pg/mL — ABNORMAL HIGH (ref 0.0–100.0)

## 2021-06-18 NOTE — Progress Notes (Addendum)
ID:  CASSIDEE DEATS, DOB 06-Nov-1940, MRN [id number]   Provider location: [facility] Type of Visit: Established patient   PCP:  Elise Benne  HF Cardiologist:  Dr. Aundra Dubin   History of Present Illness: Alisha [name] is a 80 y.o. female who has a history of diabetes, HTN, PAD, hyperlipidemia. She was referred by Dr. Wynonia Lawman for evaluation of pulmonary hypertension.    For > 1 year, she has had significant exertional dyspnea. It has been steadily worsening, especially over the last few months.  She has been extensively worked up so far.  Echo in 4/18 showed preserved EF 65% with moderate pulmonary hypertension.  She had episodes of syncope in 5/18 and 10/18.  She wore an event monitor in 5/18 with no significant arrhythmia.  LHC/RHC in 1/19 showed normal PCWP and severely elevated PA pressure, no significant CAD.     At initial appointment in 1/19, she was noted to be hypoxemic with exertion and home oxygen was started for use with exertion.  I also started her on Opsumit. She had to stop this after about a week due to significantly increased exertional peripheral edema.  This resolved after Opsumit was stopped.    Echo in 4/19.  This showed EF 60-65%, mild MS, mild AS, moderately dilated RV with mildly decreased systolic function.    She was found to have Fe deficiency anemia, FOBT+.  EGD showed gastritis and c-scope showed diverticulosis and polyps in 4/19.  She get IV Fe.  She is going to have a capsule endoscopy.    She tried to start ambrisentan, but developed worsening dyspnea and peripheral edema and low oxygen saturation.  This improved after ambrisentan was stopped.    She is now taking riociguat and Uptravi.  RHC in 4/20 showed normal filling pressures but severe pulmonary hypertension with PVR 6 WU.  Echo showed moderately dilated RV with normal systolic function, mild-moderate MS, mild AS.  [name] has now been titrated up to 2400 mcg bid and  riociguat to 1.5 mg tid.    She had COVID-19 PNA in 2/21, hospitalized for about a week.   Echo in 4/21 showed EF 34-28%, grade 2 diastolic dysfunction, normal RV, PASP 50 mmHg, probably mild mitral stenosis with mean gradient 7 mmHg and MVA 2 cm^2 by PHT.  Echo in 4/22 showed EF 60-65%, moderate LVH, moderate AS with mean gradient 27 mmHg and AVA 1.06 cm^2, moderate mitral stenosis with mean gradient 7 and MVA 1.2 cm^2, normal RV size and systolic function, PASP 41 mmHg.  TEE was done in 4/22 to more closely assess the mitral and aortic valves, EF 60-65%, d-shaped septum, mild RV enlargement with mildly decreased RV systolic function, peak RV-RA gradient 38 mmHg, moderate AS with AVA 1.31 cm^2 mean gradient 26 mmHg, mild-moderate MR with mild-moderate MS mean gradient 7 with MVA 1.7 cm^2 by VTI .  Returned for follow up 8/22 and was volume overloaded. Lasix stopped and torsemide started 60 mg bid. Amlodipine increased to 10. Instructed to wear oxygen with exertion.  Last seen for f/u on 05/26/21. Had noted intermittent hypoxia and restarted home 02. It was discovered she was taking incorrect dose of Torsemide, increased to 60 mg BID and continued on metolazone once weekly. ReDS 35%.  She is here today for clinic f/u. Weight down 9lb. Dyspnea significantly improved. Recently received iron infusions per hematologist. She was able to wean herself off home oxygen today. Checking 02 sats regularly. No  orthopnea or PND. Some lower extremity edema. No dizziness or syncope.    6 minute walk (1/19): 92 m, oxygen saturation dropped to 70%.   6 minute walk (3/19): 122 m 6 minute walk (4/19): 392 m 6 minute walk (9/19): 317 m 6 minute walk (1/20): 320 m 6 minute walk (8/20): 359 m 6 minute walk (10/20): 391 m 6 minute walk (9/21): 335 m, oxygen saturation 90-96% 6 minute walk (1/22): 305 m 6 minute walk (8/22): 311 m, oxygen saturation dropped as low at 79%   Labs (1/19): anti-Jo1 negative, RF  borderline positive, SCL-70 negative, HIV negative. ESR 21. BNP 1129.  Labs (3/19): ANA + => anti-dsDNA negative, SSA/SSB negative, anti-Sm negative, anti-RNP negative. K 3.8 => 3.7, creatinine 1.4 => 1.55.  Labs (4/19): K 4.1 => 3.1, creatinine 1.58 => 1.2, BNP 1902, CCP Ab negative.  Labs (6/19): hgb 9, creatinine 1.66, K 3 Labs (7/19): K 4.9, creatinine 1.7 Labs (8/19): K 3.6, creatinine 1.36 Labs (9/19): K 4.1, creatinine 1.55, BNP 641 Labs (10/19): K 4.7, creatinine 1.88, BNP 595 Labs (1/20): Hgb 12.2 Labs (4/20): K 4.2, creatinine 2.2, hgb 8.4 Labs (7/20): K 3.2, creatinine 2.07, hgb 8 Labs (8/20): K 3.7, creatinine 1.46 Labs (9/20): K 4, creatinine 1.5 Labs (10/20): K 4.5, creatinine 1.75 Labs (2/21): K 4.1, creatinine 1.78 Labs (7/21): K 3.7, creatinine 1.22, hgb 12.2 Labs (1/22): TSH normal, Mg 2.3, creatinine 1.36, K 3, hgb 12.4, BNP 342 Labs (2/22): LDL 66 Labs (3/22): K 3.6, creatinine 1.02 Labs (5/22): K 4.4, creatinine 1.62, BNP 823 Labs (8/22): K 3.6, creatinine 1.83   PMH: 1. PAD: Non-healing left ankle ulcer.  - ABIs 9/12 were normal.  2. Type 2 diabetes 3. Hypothyroidism 4. CKD stage 3 5. Hyperlipidemia 6. HTN 7. IBS 8. OSA: on CPAP.  9. Event monitor 5/18: No significant abnormality.  10. DVT in 1995 11. Pulmonary hypertension: Echo (4/18) with EF 65%, mild MR, mild TR, mild mitral stenosis, moderate pulmonary hypertension. - CTA chest 10/17: No PE.  - RHC (1/19): mean RA 11, PA 87/31 mean 51, mean PCWP 6, CI 3.58, PVR 7 WU.  - PFTs (1/19): mild obstruction, some restriction => severely decreased DLCO. - High resolution CT chest (1/19): no interstitial lung disease or emphysema. 7 mm nodule RUL.  - anti-Jo1 negative, RF borderline positive, SCL-70 negative, HIV negative. ANA + => anti-dsDNA negative, SSA/SSB negative, anti-Sm negative, anti-RNP negative. - V/Q scan 2/19: No e/o chronic PE.  - Echo (4/19): EF 60-65%, mild LVH, mild aortic stenosis, mild  mitral stenosis, moderately dilated RV with mildly decreased systolic function, PASP 36 mmHg (poor envelope, may be underestimated).  - Does not tolerate ERAs (macitentan, ambrisentan).  - RHC (4/20): mean RA 5, PA 85/22 mean 51, mean PCWP 12, CI  3.5, PVR 6 WU.  - Echo (4/20): EF > 65%, moderate LVH, RV moderately dilated with normal systolic function, moderate pericardial effusion, mild-moderate mitral stenosis with mean gradient 7/MVA by PHT 2.48 cm^2, mild AS mean gradient 12.  - Echo (4/21): EF 92-11%, grade 2 diastolic dysfunction, normal RV, PASP 50 mmHg, probably mild mitral stenosis with mean gradient 7 mmHg and MVA 2 cm^2 by PHT, mild AS.  - Echo (4/22): EF 60-65%, moderate LVH, moderate AS with mean gradient 27 mmHg and AVA 1.06 cm^2, moderate mitral stenosis with mean gradient 7 and MVA 1.2 cm^2, normal RV size and systolic function, PASP 41 mmHg.  - TEE (4/22): EF 60-65%, d-shaped septum, mild RV enlargement  with mildly decreased RV systolic function, peak RV-RA gradient 38 mmHg, moderate AS with AVA 1.31 cm^2 mean gradient 26 mmHg, mild-moderate MR with mild-moderate MS mean gradient 7 with MVA 1.7 cm^2 by VTI . 12. Coronary angiography 11/18 without significant disease.  - Coronary angiography in 1/19 with luminal irregularities 13. Mitral stenosis: Mild by cath in 1/19, mean gradient 2.6 mmHg. Mild on 4/19 echo.  - Mild-moderate by echo in 4/20.  - Mild by echo in 4/21.  - Moderate by echo in 4/22.  - Mild-moderate by TEE in 4/22.  14. Aortic stenosis: Mild on 4/19 echo.  - Mild on 4/20 echo.  - Mild on 4/21 echo.  - Moderate on 4/22 echo.  - Moderate on 4/22 TEE 15. Fe deficiency anemia: EGD showed gastritis and c-scope showed diverticulosis and polyps in 4/19. Capsule endoscopy was unrevealing.  16. Pulmonary nodule: CT chest 7/19 with RUL nodule.  17. Low back pain/sciatica 18. COVID-19 PNA in 2/21.   Current Outpatient Medications  Medication Sig Dispense Refill    ADEMPAS 2.5 MG TABS TAKE 1 TABLET THREE TIMES A DAY 90 tablet 11   allopurinol (ZYLOPRIM) 100 MG tablet Take 1 tablet (100 mg total) by mouth daily. 90 tablet 0   amLODipine (NORVASC) 10 MG tablet Take 1 tablet (10 mg total) by mouth daily. 90 tablet 3   atorvastatin (LIPITOR) 20 MG tablet TAKE 1 TABLET BY MOUTH IN  THE MORNING 90 tablet 3   cetirizine (ZYRTEC) 10 MG tablet Take 10 mg by mouth daily as needed for allergies.     fenofibrate 160 MG tablet Take 1 tablet (160 mg total) by mouth every morning. 30 tablet 2   ferrous sulfate 325 (65 FE) MG tablet Take 1 tablet (325 mg total) by mouth daily with breakfast. Morning and night 30 tablet 0   glucose blood (ACCU-CHEK AVIVA PLUS) test strip Check blood sugar TID dx: E11.9 100 each 12   levothyroxine (SYNTHROID) 125 MCG tablet Take 1 tablet (125 mcg total) by mouth daily before breakfast. 30 tablet 2   loperamide (IMODIUM A-D) 2 MG tablet Take 2 mg by mouth daily as needed for diarrhea or loose stools.     loratadine (CLARITIN) 10 MG tablet Take 10 mg by mouth daily as needed for allergies.      metFORMIN (GLUCOPHAGE) 500 MG tablet Take 1 tablet (500 mg total) by mouth 2 (two) times daily with a meal. (Patient taking differently: Take 500 mg by mouth. Patient takes once a day unless her blood sugar reading is 180 or higher.) 180 tablet 1   metFORMIN (GLUCOPHAGE) 500 MG tablet Take 500 mg by mouth. Patient takes 1 tablet a day unless her blood sugar reading is 180 or higher.     metolazone (ZAROXOLYN) 2.5 MG tablet Take 1 tablet (2.5 mg total) by mouth once a week. Take potassium 14mq with this 4 tablet 6   metoprolol succinate (TOPROL XL) 25 MG 24 hr tablet Take 1 tablet (25 mg total) by mouth 2 (two) times daily. 60 tablet 11   Multiple Vitamins-Minerals (PRESERVISION AREDS 2 PO) Take 1 capsule by mouth 2 (two) times daily. As needed     pantoprazole (PROTONIX) 40 MG tablet TAKE 1 TABLET BY MOUTH  TWICE DAILY 180 tablet 3   potassium chloride  (KLOR-CON) 10 MEQ tablet TAKE 1 TABLET (10 MEQ TOTAL) BY MOUTH DAILY. TAKE AN EXTRA 20MEQ (2 TABLETS) EVERY WEEK WITH METOLAZONE (Patient taking differently: Take 10 mEq by mouth  daily.) 114 tablet 1   torsemide (DEMADEX) 20 MG tablet Take 3 tablets (60 mg total) by mouth 2 (two) times daily. 180 tablet 11   traMADol (ULTRAM) 50 MG tablet Take 1 tablet (50 mg total) by mouth every 6 (six) hours as needed for moderate pain or severe pain. 8 tablet 0   UPTRAVI 1600 MCG TABS Take 1 tablet (1,600 mcg total) by mouth 2 (two) times daily. Take with 800 for a total of 2400 mg 60 tablet 11   UPTRAVI 800 MCG TABS Take 1 tablet (800 mcg total) by mouth 2 (two) times daily. Take with 1600 mcg for a total of 2400 mg 60 tablet 11   No current facility-administered medications for this encounter.    Allergies:   Other, Adhesive [tape], and Nickel   Social History:  The patient  reports that she has never smoked. She has never used smokeless tobacco. She reports current alcohol use. She reports that she does not use drugs.   Family History:  The patient's family history includes Alzheimer's disease in her father; Bleeding Disorder in her mother; Diabetes in her brother, mother, and sister; Healthy in her daughter and son; Heart disease in her brother and father.   ROS:  Please see the history of present illness.   All other systems are personally reviewed and negative.   Exam:   BP (!) 114/55   Pulse 86   Wt 70.3 kg (155 lb)   SpO2 92%   BMI 25.40 kg/m   General:  Well appearing. No resp difficulty HEENT: normal Neck: no JVD. Carotids 2+ bilat; no bruits. No lymphadenopathy or thryomegaly appreciated. Cor: PMI nondisplaced. Regular rate & rhythm. No rubs, gallops, 3/6 SEM RUSB Lungs: clear Abdomen: soft, nontender, nondistended. No hepatosplenomegaly. No bruits or masses. Good bowel sounds. Extremities: no cyanosis, clubbing, rash, 1+ LE edema Neuro: alert & orientedx3, cranial nerves grossly  intact. moves all 4 extremities w/o difficulty. Affect pleasant   Recent Labs: 09/18/2020: Magnesium 2.3; TSH 0.568 01/26/2021: ALT 9; Pro B Natriuretic peptide (BNP) 823.0 06/18/2021: B Natriuretic Peptide 986.2; BUN 34; Creatinine, Ser 1.65; Hemoglobin 9.3; Platelets 269; Potassium 3.4; Sodium 140  Personally reviewed   Wt Readings from Last 3 Encounters:  06/18/21 70.3 kg (155 lb)  05/26/21 74.7 kg (164 lb 9.6 oz)  05/14/21 69.9 kg (154 lb)    ECG  today: Sinus rhythm with sinus arrhythmia, 79 bpm  ASSESSMENT AND PLAN:  1. Pulmonary hypertension with RV failure:  Severe PAH with PVR 7 WU on 1/19 RHC.  Etiology uncertain.  She has a history of DVT remotely but V/Q scan in 2/19 did not show evidence for chronic PE. She has OSA and uses CPAP.  Serologic workup showed only positive ANA but when additional serologies were done, they were all negative (see above).  CT chest with no ILD or emphysema.  Valvular heart disease does not appear significant enough to cause this degree of PH.  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  Echo in 4/19 showed that the RV is moderately dilated with mildly decreased systolic function, PA systolic pressure estimation was only 36 mmHg but poor envelope and likely underestimated. She did not tolerate Opsumit or ambrisentan due to significant lower extremity edema and worsening dyspnea.  [place] in 4/20 showed severe pulmonary hypertension with normal filling pressures and preserved cardiac output.  Echo in 4/21 showed normal RV with PASP 50 mmHg, echo 4/22 showed normal RV with PASP estimated 41 mmHg. TEE in 4/22 showed  D-shaped septum with mild RV enlargement/mildly decreased RV systolic function.  She is volume overloaded on exam though 6 minute walk is stable.   - Continue Adempas, she is at goal.  [name] is up to 2400 mcg BID.   - She has weaned herself off home 02. Recommended she monitor 02 sats closely. Still probably needs 02 with exertion.  2. Chronic diastolic CHF: Improved.  Currently NYHA II. Volume appears stable. Continue Torsemide 60 mg BID + metolazone once weekly. On 10 mEq K daily. BMP/BNP today. Recommended she wear compression stockings at home. 3. Syncope: Suspect related to severe pulmonary hypertension.  None recently.   4. Mitral stenosis: Noted on echo and cath.  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  With heavy calcification, I do not think that she would be a mitral valvuloplasty candidate.  - Would not consider for surgery at this point (she would be high risk).  5. H/o DVT: No chronic PE on prior V/Q scan.   6. HTN: Controlled today. Continue current medications. 7. OSA: Continue CPAP qHS.  8. CKD: Stage 3.  BMET today.   9. Aortic stenosis: Moderate by 4/22 TEE.  She would be a TAVR candidate if AS severe, but could not percutaneously fix the mitral valve so may need to consider surgical MVR/AVR in the future.  Recommend repeat echo 04/23, sooner if worsening symptoms. 10. H/o GI bleeding/iron deficiency anemia: Hx chronic blood loss. Has known AVMs. Recently had 2 Iv iron infusions. Hgb 8.9 10/03 -seen by GI at [facility] a few days ago. Planning to undergo enteroscopy with possible small bowel APC at [facility].   Follow-up 6-8 weeks  Signed, Leata Mouse, PA-C  06/18/2021  [facility] [address] [facility] [place] [address] [phone] (office) [phone] (fax)

## 2021-06-18 NOTE — Patient Instructions (Signed)
It was great to see you today! No medication changes are needed at this time.  Labs today We will only contact you if something comes back abnormal or we need to make some changes. Otherwise no news is good news!  Your physician recommends that you schedule a follow-up appointment in: 6-8 weeks  in the Advanced Practitioners (PA/NP) Clinic    Do the following things EVERYDAY: Weigh yourself in the morning before breakfast. Write it down and keep it in a log. Take your medicines as prescribed Eat low salt foods--Limit salt (sodium) to 2000 mg per day.  Stay as active as you can everyday Limit all fluids for the day to less than 2 liters  At the [facility], you and your health needs are our priority. As part of our continuing mission to provide you with exceptional heart care, we have created designated Provider Care Teams. These Care Teams include your primary Cardiologist (physician) and Advanced Practice Providers (APPs- Physician Assistants and Nurse Practitioners) who all work together to provide you with the care you need, when you need it.   You may see any of the following providers on your designated Care Team at your next follow up: Dr Glori Bickers Dr Loralie Champagne Dr Patrice Paradise, NP Lyda Jester, [place] Ginnie Smart Audry Riles, PharmD   Please be sure to bring in all your medications bottles to every appointment.

## 2021-06-18 NOTE — Addendum Note (Signed)
Encounter addended by: Joette Catching, PA-C on: 06/18/2021 3:20 PM  Actions taken: Clinical Note Signed

## 2021-06-21 ENCOUNTER — Telehealth (HOSPITAL_COMMUNITY): Payer: Self-pay | Admitting: Cardiology

## 2021-06-21 DIAGNOSIS — I5032 Chronic diastolic (congestive) heart failure: Secondary | ICD-10-CM

## 2021-06-21 MED ORDER — POTASSIUM CHLORIDE ER 10 MEQ PO TBCR: 20.0000 meq | EXTENDED_RELEASE_TABLET | Freq: Every day | ORAL | 3 refills | Status: DC

## 2021-06-21 NOTE — Telephone Encounter (Signed)
Pt aware.

## 2021-06-21 NOTE — Telephone Encounter (Signed)
-----   Message from Joette Catching, [place] sent at 06/18/2021  2:15 PM EDT ----- Bnp up but stable. K 3.4. Scr improved. Continue current dose of diuretics. Increase K from 10 to 20 mEq daily. Take extra 20 mEq when taking metolazone. Repeat BMET in 2 weeks.

## 2021-07-05 ENCOUNTER — Telehealth (HOSPITAL_COMMUNITY): Payer: Self-pay | Admitting: Surgery

## 2021-07-05 ENCOUNTER — Ambulatory Visit (HOSPITAL_COMMUNITY)
Admission: RE | Admit: 2021-07-05 | Discharge: 2021-07-05 | Disposition: A | Discharge status: Home / Self Care | Payer: Medicare Other | Source: Ambulatory Visit | Attending: Internal Medicine | Admitting: Internal Medicine

## 2021-07-05 ENCOUNTER — Other Ambulatory Visit: Payer: Self-pay

## 2021-07-05 ENCOUNTER — Other Ambulatory Visit (HOSPITAL_COMMUNITY): Payer: Self-pay | Admitting: *Deleted

## 2021-07-05 DIAGNOSIS — I5032 Chronic diastolic (congestive) heart failure: Secondary | ICD-10-CM | POA: Diagnosis not present

## 2021-07-05 LAB — BASIC METABOLIC PANEL
Anion gap: 13 (ref 5–15)
BUN: 61 mg/dL — ABNORMAL HIGH (ref 8–23)
CO2: 23 mmol/L (ref 22–32)
Calcium: 8.8 mg/dL — ABNORMAL LOW (ref 8.9–10.3)
Chloride: 99 mmol/L (ref 98–111)
Creatinine, Ser: 2.44 mg/dL — ABNORMAL HIGH (ref 0.44–1.00)
GFR, Estimated: 20 mL/min — ABNORMAL LOW (ref >60)
Glucose, Bld: 177 mg/dL — ABNORMAL HIGH (ref 70–99)
Potassium: 3.3 mmol/L — ABNORMAL LOW (ref 3.5–5.1)
Sodium: 135 mmol/L (ref 135–145)

## 2021-07-05 MED ORDER — POTASSIUM CHLORIDE ER 10 MEQ PO TBCR: 20.0000 meq | EXTENDED_RELEASE_TABLET | Freq: Every day | ORAL | 3 refills | Status: DC

## 2021-07-05 MED ORDER — POTASSIUM CHLORIDE ER 10 MEQ PO TBCR: 30.0000 meq | EXTENDED_RELEASE_TABLET | Freq: Every day | ORAL | 3 refills | Status: DC

## 2021-07-05 MED ORDER — TORSEMIDE 20 MG PO TABS: 40.0000 mg | ORAL_TABLET | Freq: Two times a day (BID) | ORAL | 11 refills | Status: DC

## 2021-07-05 NOTE — Telephone Encounter (Signed)
-----   Message from Joette Catching, [place] sent at 07/05/2021 12:26 PM EDT ----- Scr is worse, up to 2.44. K low. Suspect she is getting volume depleted. Decrease torsemide to 40 mg BID and hold this week's metolazone. Increase K to 30 mEq daily. Get repeat BMET on 10/28 or 10/31.  Call with worsening dyspnea or rapid weight gain.

## 2021-07-09 MED ORDER — POTASSIUM CHLORIDE ER 10 MEQ PO TBCR: 30.0000 meq | EXTENDED_RELEASE_TABLET | Freq: Every day | ORAL | 3 refills | Status: DC

## 2021-07-09 NOTE — Addendum Note (Signed)
Addended by: Scarlette Calico on: 07/09/2021 04:50 PM   Modules accepted: Orders

## 2021-07-12 ENCOUNTER — Ambulatory Visit (HOSPITAL_COMMUNITY)
Admission: RE | Admit: 2021-07-12 | Discharge: 2021-07-12 | Disposition: A | Discharge status: Home / Self Care | Payer: Medicare Other | Source: Ambulatory Visit | Attending: Adult Health | Admitting: Adult Health

## 2021-07-12 ENCOUNTER — Other Ambulatory Visit: Payer: Self-pay

## 2021-07-12 DIAGNOSIS — I5032 Chronic diastolic (congestive) heart failure: Secondary | ICD-10-CM | POA: Diagnosis not present

## 2021-07-12 LAB — BASIC METABOLIC PANEL WITH GFR
Anion gap: 14 (ref 5–15)
BUN: 105 mg/dL — ABNORMAL HIGH (ref 8–23)
CO2: 17 mmol/L — ABNORMAL LOW (ref 22–32)
Calcium: 9 mg/dL (ref 8.9–10.3)
Chloride: 108 mmol/L (ref 98–111)
Creatinine, Ser: 3.56 mg/dL — ABNORMAL HIGH (ref 0.44–1.00)
GFR, Estimated: 13 mL/min — ABNORMAL LOW
Glucose, Bld: 197 mg/dL — ABNORMAL HIGH (ref 70–99)
Potassium: 3.8 mmol/L (ref 3.5–5.1)
Sodium: 139 mmol/L (ref 135–145)

## 2021-07-14 ENCOUNTER — Ambulatory Visit: Payer: Medicare Other

## 2021-07-16 ENCOUNTER — Emergency Department (HOSPITAL_COMMUNITY): Payer: Medicare Other

## 2021-07-16 ENCOUNTER — Encounter (HOSPITAL_COMMUNITY): Payer: Self-pay

## 2021-07-16 ENCOUNTER — Ambulatory Visit (HOSPITAL_COMMUNITY)
Admission: RE | Admit: 2021-07-16 | Discharge: 2021-07-16 | Disposition: A | Discharge status: Home / Self Care | Payer: Medicare Other | Source: Ambulatory Visit | Attending: Cardiology | Admitting: Cardiology

## 2021-07-16 ENCOUNTER — Other Ambulatory Visit: Payer: Self-pay

## 2021-07-16 ENCOUNTER — Inpatient Hospital Stay (HOSPITAL_COMMUNITY)
Admission: EM | Admit: 2021-07-16 | Discharge: 2021-07-24 | DRG: 682 | Disposition: A | Discharge status: Home / Self Care | Payer: Medicare Other | Source: Ambulatory Visit | Attending: Family Medicine | Admitting: Family Medicine

## 2021-07-16 DIAGNOSIS — I080 Rheumatic disorders of both mitral and aortic valves: Secondary | ICD-10-CM | POA: Diagnosis present

## 2021-07-16 DIAGNOSIS — Z8249 Family history of ischemic heart disease and other diseases of the circulatory system: Secondary | ICD-10-CM

## 2021-07-16 DIAGNOSIS — Z832 Family history of diseases of the blood and blood-forming organs and certain disorders involving the immune mechanism: Secondary | ICD-10-CM

## 2021-07-16 DIAGNOSIS — E785 Hyperlipidemia, unspecified: Secondary | ICD-10-CM | POA: Diagnosis present

## 2021-07-16 DIAGNOSIS — I2510 Atherosclerotic heart disease of native coronary artery without angina pectoris: Secondary | ICD-10-CM | POA: Diagnosis present

## 2021-07-16 DIAGNOSIS — E1151 Type 2 diabetes mellitus with diabetic peripheral angiopathy without gangrene: Secondary | ICD-10-CM | POA: Diagnosis present

## 2021-07-16 DIAGNOSIS — Z820 Family history of epilepsy and other diseases of the nervous system: Secondary | ICD-10-CM

## 2021-07-16 DIAGNOSIS — R55 Syncope and collapse: Secondary | ICD-10-CM | POA: Diagnosis present

## 2021-07-16 DIAGNOSIS — E039 Hypothyroidism, unspecified: Secondary | ICD-10-CM | POA: Diagnosis present

## 2021-07-16 DIAGNOSIS — Z833 Family history of diabetes mellitus: Secondary | ICD-10-CM

## 2021-07-16 DIAGNOSIS — I5189 Other ill-defined heart diseases: Secondary | ICD-10-CM | POA: Insufficient documentation

## 2021-07-16 DIAGNOSIS — Z8719 Personal history of other diseases of the digestive system: Secondary | ICD-10-CM

## 2021-07-16 DIAGNOSIS — I5084 End stage heart failure: Secondary | ICD-10-CM | POA: Diagnosis present

## 2021-07-16 DIAGNOSIS — I272 Pulmonary hypertension, unspecified: Secondary | ICD-10-CM

## 2021-07-16 DIAGNOSIS — E1143 Type 2 diabetes mellitus with diabetic autonomic (poly)neuropathy: Secondary | ICD-10-CM | POA: Diagnosis present

## 2021-07-16 DIAGNOSIS — Z20822 Contact with and (suspected) exposure to covid-19: Secondary | ICD-10-CM | POA: Diagnosis present

## 2021-07-16 DIAGNOSIS — E1122 Type 2 diabetes mellitus with diabetic chronic kidney disease: Secondary | ICD-10-CM | POA: Diagnosis present

## 2021-07-16 DIAGNOSIS — R9431 Abnormal electrocardiogram [ECG] [EKG]: Secondary | ICD-10-CM | POA: Diagnosis present

## 2021-07-16 DIAGNOSIS — R0600 Dyspnea, unspecified: Secondary | ICD-10-CM

## 2021-07-16 DIAGNOSIS — N179 Acute kidney failure, unspecified: Secondary | ICD-10-CM | POA: Diagnosis not present

## 2021-07-16 DIAGNOSIS — D631 Anemia in chronic kidney disease: Secondary | ICD-10-CM | POA: Diagnosis present

## 2021-07-16 DIAGNOSIS — Z86010 Personal history of colonic polyps: Secondary | ICD-10-CM

## 2021-07-16 DIAGNOSIS — E872 Acidosis, unspecified: Secondary | ICD-10-CM | POA: Diagnosis present

## 2021-07-16 DIAGNOSIS — K3184 Gastroparesis: Secondary | ICD-10-CM | POA: Diagnosis present

## 2021-07-16 DIAGNOSIS — D500 Iron deficiency anemia secondary to blood loss (chronic): Secondary | ICD-10-CM | POA: Diagnosis present

## 2021-07-16 DIAGNOSIS — I5032 Chronic diastolic (congestive) heart failure: Secondary | ICD-10-CM

## 2021-07-16 DIAGNOSIS — E876 Hypokalemia: Secondary | ICD-10-CM | POA: Diagnosis present

## 2021-07-16 DIAGNOSIS — I132 Hypertensive heart and chronic kidney disease with heart failure and with stage 5 chronic kidney disease, or end stage renal disease: Secondary | ICD-10-CM | POA: Diagnosis present

## 2021-07-16 DIAGNOSIS — N184 Chronic kidney disease, stage 4 (severe): Secondary | ICD-10-CM | POA: Diagnosis present

## 2021-07-16 DIAGNOSIS — G4733 Obstructive sleep apnea (adult) (pediatric): Secondary | ICD-10-CM | POA: Diagnosis present

## 2021-07-16 DIAGNOSIS — J9601 Acute respiratory failure with hypoxia: Secondary | ICD-10-CM | POA: Diagnosis not present

## 2021-07-16 DIAGNOSIS — Z7984 Long term (current) use of oral hypoglycemic drugs: Secondary | ICD-10-CM

## 2021-07-16 DIAGNOSIS — I50810 Right heart failure, unspecified: Secondary | ICD-10-CM | POA: Diagnosis present

## 2021-07-16 DIAGNOSIS — I2721 Secondary pulmonary arterial hypertension: Secondary | ICD-10-CM | POA: Diagnosis present

## 2021-07-16 DIAGNOSIS — I5033 Acute on chronic diastolic (congestive) heart failure: Secondary | ICD-10-CM | POA: Diagnosis not present

## 2021-07-16 DIAGNOSIS — N185 Chronic kidney disease, stage 5: Secondary | ICD-10-CM | POA: Diagnosis present

## 2021-07-16 DIAGNOSIS — K5520 Angiodysplasia of colon without hemorrhage: Secondary | ICD-10-CM | POA: Diagnosis present

## 2021-07-16 DIAGNOSIS — Z79890 Hormone replacement therapy (postmenopausal): Secondary | ICD-10-CM

## 2021-07-16 DIAGNOSIS — Z79899 Other long term (current) drug therapy: Secondary | ICD-10-CM

## 2021-07-16 DIAGNOSIS — Z86718 Personal history of other venous thrombosis and embolism: Secondary | ICD-10-CM

## 2021-07-16 DIAGNOSIS — Z66 Do not resuscitate: Secondary | ICD-10-CM | POA: Diagnosis not present

## 2021-07-16 LAB — BASIC METABOLIC PANEL
Anion gap: 14 (ref 5–15)
BUN: 99 mg/dL — ABNORMAL HIGH (ref 8–23)
CO2: 14 mmol/L — ABNORMAL LOW (ref 22–32)
Calcium: 8.6 mg/dL — ABNORMAL LOW (ref 8.9–10.3)
Chloride: 107 mmol/L (ref 98–111)
Creatinine, Ser: 4.37 mg/dL — ABNORMAL HIGH (ref 0.44–1.00)
GFR, Estimated: 10 mL/min — ABNORMAL LOW (ref >60)
Glucose, Bld: 197 mg/dL — ABNORMAL HIGH (ref 70–99)
Potassium: 3.6 mmol/L (ref 3.5–5.1)
Sodium: 135 mmol/L (ref 135–145)

## 2021-07-16 LAB — CBC WITH DIFFERENTIAL/PLATELET
Abs Immature Granulocytes: 0.08 10*3/uL — ABNORMAL HIGH (ref 0.00–0.07)
Basophils Absolute: 0.1 10*3/uL (ref 0.0–0.1)
Basophils Relative: 1 %
Eosinophils Absolute: 0.1 10*3/uL (ref 0.0–0.5)
Eosinophils Relative: 2 %
HCT: 28 % — ABNORMAL LOW (ref 36.0–46.0)
Hemoglobin: 8.8 g/dL — ABNORMAL LOW (ref 12.0–15.0)
Immature Granulocytes: 1 %
Lymphocytes Relative: 7 %
Lymphs Abs: 0.6 10*3/uL — ABNORMAL LOW (ref 0.7–4.0)
MCH: 27.1 pg (ref 26.0–34.0)
MCHC: 31.4 g/dL (ref 30.0–36.0)
MCV: 86.2 fL (ref 80.0–100.0)
Monocytes Absolute: 0.5 10*3/uL (ref 0.1–1.0)
Monocytes Relative: 6 %
Neutro Abs: 7.2 10*3/uL (ref 1.7–7.7)
Neutrophils Relative %: 83 %
Platelets: 339 10*3/uL (ref 150–400)
RBC: 3.25 MIL/uL — ABNORMAL LOW (ref 3.87–5.11)
RDW: 19.3 % — ABNORMAL HIGH (ref 11.5–15.5)
WBC: 8.6 10*3/uL (ref 4.0–10.5)
nRBC: 0 % (ref 0.0–0.2)

## 2021-07-16 LAB — COMPREHENSIVE METABOLIC PANEL
ALT: 9 U/L (ref 0–44)
AST: 12 U/L — ABNORMAL LOW (ref 15–41)
Albumin: 3.4 g/dL — ABNORMAL LOW (ref 3.5–5.0)
Alkaline Phosphatase: 54 U/L (ref 38–126)
Anion gap: 15 (ref 5–15)
BUN: 97 mg/dL — ABNORMAL HIGH (ref 8–23)
CO2: 13 mmol/L — ABNORMAL LOW (ref 22–32)
Calcium: 8.4 mg/dL — ABNORMAL LOW (ref 8.9–10.3)
Chloride: 106 mmol/L (ref 98–111)
Creatinine, Ser: 4.3 mg/dL — ABNORMAL HIGH (ref 0.44–1.00)
GFR, Estimated: 10 mL/min — ABNORMAL LOW (ref >60)
Glucose, Bld: 146 mg/dL — ABNORMAL HIGH (ref 70–99)
Potassium: 3.8 mmol/L (ref 3.5–5.1)
Sodium: 134 mmol/L — ABNORMAL LOW (ref 135–145)
Total Bilirubin: 0.6 mg/dL (ref 0.3–1.2)
Total Protein: 6.4 g/dL — ABNORMAL LOW (ref 6.5–8.1)

## 2021-07-16 NOTE — ED Triage Notes (Signed)
Pt states that she has had decreased urination over the past few days and went for a check up today and told she was in kidney failure

## 2021-07-16 NOTE — ED Provider Notes (Signed)
Emergency Medicine Provider Triage Evaluation Note  Alisha [name] , a 80 y.o. female  was evaluated in triage.  Pt complains of decreased urination and worsening kidney function.  Patient states she has not urinated in 2 to 3 days.  She gets her kidney function monitor, was told today that it is worsening and she needs to come to the ER.  She does report shortness of breath for the past week, this usually improves after she gets a blood transfusion which she had yesterday, however has not yet improved.  No fevers. No confusion  Review of Systems  Positive: Urinary retention, sob Negative: fever  Physical Exam  BP 126/78   Pulse 70   Temp 98.2 F (36.8 C) (Oral)   Resp 18   SpO2 95%  Gen:   Awake Resp:  Tachypneic.  Sats in the mid to low 90s MSK:   Bilateral pitting edema Other:  No ttp of abd  Medical Decision Making  Medically screening exam initiated at 7:57 PM.  Appropriate orders placed.  Alisha [name] was informed that the remainder of the evaluation will be completed by another provider, this initial triage assessment does not replace that evaluation, and the importance of remaining in the ED until their evaluation is complete.  Labs, ct, ua   [place], [place], PA-C 07/16/21 1959    Jeanell Sparrow, DO 07/16/21 2255

## 2021-07-17 ENCOUNTER — Encounter (HOSPITAL_COMMUNITY): Payer: Self-pay | Admitting: Family Medicine

## 2021-07-17 DIAGNOSIS — I50810 Right heart failure, unspecified: Secondary | ICD-10-CM | POA: Diagnosis present

## 2021-07-17 DIAGNOSIS — I3139 Other pericardial effusion (noninflammatory): Secondary | ICD-10-CM | POA: Diagnosis not present

## 2021-07-17 DIAGNOSIS — N179 Acute kidney failure, unspecified: Secondary | ICD-10-CM | POA: Diagnosis present

## 2021-07-17 DIAGNOSIS — R55 Syncope and collapse: Secondary | ICD-10-CM | POA: Diagnosis present

## 2021-07-17 DIAGNOSIS — I2510 Atherosclerotic heart disease of native coronary artery without angina pectoris: Secondary | ICD-10-CM

## 2021-07-17 DIAGNOSIS — N185 Chronic kidney disease, stage 5: Secondary | ICD-10-CM | POA: Diagnosis present

## 2021-07-17 DIAGNOSIS — G4733 Obstructive sleep apnea (adult) (pediatric): Secondary | ICD-10-CM | POA: Diagnosis present

## 2021-07-17 DIAGNOSIS — I340 Nonrheumatic mitral (valve) insufficiency: Secondary | ICD-10-CM | POA: Diagnosis not present

## 2021-07-17 DIAGNOSIS — I5032 Chronic diastolic (congestive) heart failure: Secondary | ICD-10-CM

## 2021-07-17 DIAGNOSIS — I342 Nonrheumatic mitral (valve) stenosis: Secondary | ICD-10-CM | POA: Diagnosis not present

## 2021-07-17 DIAGNOSIS — D631 Anemia in chronic kidney disease: Secondary | ICD-10-CM | POA: Diagnosis present

## 2021-07-17 DIAGNOSIS — N184 Chronic kidney disease, stage 4 (severe): Secondary | ICD-10-CM | POA: Diagnosis not present

## 2021-07-17 DIAGNOSIS — I272 Pulmonary hypertension, unspecified: Secondary | ICD-10-CM | POA: Diagnosis not present

## 2021-07-17 DIAGNOSIS — K5520 Angiodysplasia of colon without hemorrhage: Secondary | ICD-10-CM

## 2021-07-17 DIAGNOSIS — R0609 Other forms of dyspnea: Secondary | ICD-10-CM | POA: Diagnosis not present

## 2021-07-17 DIAGNOSIS — I5043 Acute on chronic combined systolic (congestive) and diastolic (congestive) heart failure: Secondary | ICD-10-CM | POA: Diagnosis not present

## 2021-07-17 DIAGNOSIS — I5084 End stage heart failure: Secondary | ICD-10-CM | POA: Diagnosis present

## 2021-07-17 DIAGNOSIS — E785 Hyperlipidemia, unspecified: Secondary | ICD-10-CM | POA: Diagnosis present

## 2021-07-17 DIAGNOSIS — K3184 Gastroparesis: Secondary | ICD-10-CM | POA: Diagnosis present

## 2021-07-17 DIAGNOSIS — Z20822 Contact with and (suspected) exposure to covid-19: Secondary | ICD-10-CM | POA: Diagnosis present

## 2021-07-17 DIAGNOSIS — I350 Nonrheumatic aortic (valve) stenosis: Secondary | ICD-10-CM | POA: Diagnosis not present

## 2021-07-17 DIAGNOSIS — Z7189 Other specified counseling: Secondary | ICD-10-CM | POA: Diagnosis not present

## 2021-07-17 DIAGNOSIS — E039 Hypothyroidism, unspecified: Secondary | ICD-10-CM | POA: Diagnosis present

## 2021-07-17 DIAGNOSIS — R9431 Abnormal electrocardiogram [ECG] [EKG]: Secondary | ICD-10-CM

## 2021-07-17 DIAGNOSIS — I2721 Secondary pulmonary arterial hypertension: Secondary | ICD-10-CM | POA: Diagnosis present

## 2021-07-17 DIAGNOSIS — R0602 Shortness of breath: Secondary | ICD-10-CM | POA: Diagnosis not present

## 2021-07-17 DIAGNOSIS — E1151 Type 2 diabetes mellitus with diabetic peripheral angiopathy without gangrene: Secondary | ICD-10-CM | POA: Diagnosis present

## 2021-07-17 DIAGNOSIS — D500 Iron deficiency anemia secondary to blood loss (chronic): Secondary | ICD-10-CM | POA: Diagnosis present

## 2021-07-17 DIAGNOSIS — Z515 Encounter for palliative care: Secondary | ICD-10-CM | POA: Diagnosis not present

## 2021-07-17 DIAGNOSIS — I5033 Acute on chronic diastolic (congestive) heart failure: Secondary | ICD-10-CM | POA: Diagnosis not present

## 2021-07-17 DIAGNOSIS — E872 Acidosis, unspecified: Secondary | ICD-10-CM | POA: Diagnosis present

## 2021-07-17 DIAGNOSIS — I080 Rheumatic disorders of both mitral and aortic valves: Secondary | ICD-10-CM | POA: Diagnosis present

## 2021-07-17 DIAGNOSIS — E876 Hypokalemia: Secondary | ICD-10-CM | POA: Diagnosis present

## 2021-07-17 DIAGNOSIS — E1122 Type 2 diabetes mellitus with diabetic chronic kidney disease: Secondary | ICD-10-CM | POA: Diagnosis present

## 2021-07-17 DIAGNOSIS — Z66 Do not resuscitate: Secondary | ICD-10-CM | POA: Diagnosis not present

## 2021-07-17 DIAGNOSIS — J9601 Acute respiratory failure with hypoxia: Secondary | ICD-10-CM | POA: Diagnosis not present

## 2021-07-17 DIAGNOSIS — E1143 Type 2 diabetes mellitus with diabetic autonomic (poly)neuropathy: Secondary | ICD-10-CM | POA: Diagnosis present

## 2021-07-17 DIAGNOSIS — I132 Hypertensive heart and chronic kidney disease with heart failure and with stage 5 chronic kidney disease, or end stage renal disease: Secondary | ICD-10-CM | POA: Diagnosis present

## 2021-07-17 LAB — BASIC METABOLIC PANEL
Anion gap: 12 (ref 5–15)
Anion gap: 13 (ref 5–15)
BUN: 95 mg/dL — ABNORMAL HIGH (ref 8–23)
BUN: 93 mg/dL — ABNORMAL HIGH (ref 8–23)
CO2: 16 mmol/L — ABNORMAL LOW (ref 22–32)
CO2: 15 mmol/L — ABNORMAL LOW (ref 22–32)
Calcium: 8 mg/dL — ABNORMAL LOW (ref 8.9–10.3)
Calcium: 8 mg/dL — ABNORMAL LOW (ref 8.9–10.3)
Chloride: 108 mmol/L (ref 98–111)
Chloride: 108 mmol/L (ref 98–111)
Creatinine, Ser: 4.01 mg/dL — ABNORMAL HIGH (ref 0.44–1.00)
Creatinine, Ser: 4.05 mg/dL — ABNORMAL HIGH (ref 0.44–1.00)
GFR, Estimated: 11 mL/min — ABNORMAL LOW (ref >60)
GFR, Estimated: 11 mL/min — ABNORMAL LOW (ref >60)
Glucose, Bld: 145 mg/dL — ABNORMAL HIGH (ref 70–99)
Glucose, Bld: 225 mg/dL — ABNORMAL HIGH (ref 70–99)
Potassium: 3.6 mmol/L (ref 3.5–5.1)
Potassium: 3.6 mmol/L (ref 3.5–5.1)
Sodium: 136 mmol/L (ref 135–145)
Sodium: 136 mmol/L (ref 135–145)

## 2021-07-17 LAB — RESP PANEL BY RT-PCR (FLU A&B, COVID) ARPGX2
Influenza A by PCR: NEGATIVE
Influenza B by PCR: NEGATIVE
SARS Coronavirus 2 by RT PCR: NEGATIVE

## 2021-07-17 LAB — TROPONIN I (HIGH SENSITIVITY)
Troponin I (High Sensitivity): 17 ng/L (ref <18)
Troponin I (High Sensitivity): 18 ng/L — ABNORMAL HIGH (ref <18)

## 2021-07-17 LAB — BRAIN NATRIURETIC PEPTIDE: B Natriuretic Peptide: 1247.8 pg/mL — ABNORMAL HIGH (ref 0.0–100.0)

## 2021-07-17 MED ORDER — SODIUM BICARBONATE 650 MG PO TABS
650.0000 mg | ORAL_TABLET | Freq: Three times a day (TID) | ORAL | Status: DC
  Administered 2021-07-17 – 2021-07-24 (×21): 650 mg via ORAL
  Filled 2021-07-17 (×21): qty 1

## 2021-07-17 MED ORDER — SELEXIPAG 1600 MCG PO TABS
1600.0000 ug | ORAL_TABLET | Freq: Two times a day (BID) | ORAL | Status: DC
  Administered 2021-07-17 – 2021-07-24 (×14): 1600 ug via ORAL
  Filled 2021-07-17 (×2): qty 1

## 2021-07-17 MED ORDER — ONDANSETRON HCL 4 MG PO TABS: 4.0000 mg | ORAL_TABLET | Freq: Four times a day (QID) | ORAL | Status: DC | PRN

## 2021-07-17 MED ORDER — ONDANSETRON HCL 4 MG/2ML IJ SOLN: 4.0000 mg | Freq: Four times a day (QID) | INTRAMUSCULAR | Status: DC | PRN

## 2021-07-17 MED ORDER — SODIUM CHLORIDE 0.9 % IV SOLN: INTRAVENOUS | Status: AC

## 2021-07-17 MED ORDER — PANTOPRAZOLE SODIUM 40 MG PO TBEC
40.0000 mg | DELAYED_RELEASE_TABLET | Freq: Two times a day (BID) | ORAL | Status: DC
  Administered 2021-07-17 – 2021-07-24 (×13): 40 mg via ORAL
  Filled 2021-07-17 (×14): qty 1

## 2021-07-17 MED ORDER — LEVOTHYROXINE SODIUM 25 MCG PO TABS
125.0000 ug | ORAL_TABLET | Freq: Every day | ORAL | Status: DC
  Administered 2021-07-17 – 2021-07-24 (×8): 125 ug via ORAL
  Filled 2021-07-17 (×8): qty 1

## 2021-07-17 MED ORDER — SODIUM CHLORIDE 0.9% FLUSH
3.0000 mL | Freq: Two times a day (BID) | INTRAVENOUS | Status: DC
  Administered 2021-07-17 – 2021-07-23 (×6): 3 mL via INTRAVENOUS

## 2021-07-17 MED ORDER — SELEXIPAG 800 MCG PO TABS
800.0000 ug | ORAL_TABLET | Freq: Two times a day (BID) | ORAL | Status: DC
  Administered 2021-07-17 – 2021-07-24 (×14): 800 ug via ORAL
  Filled 2021-07-17 (×2): qty 1

## 2021-07-17 MED ORDER — ATORVASTATIN CALCIUM 10 MG PO TABS
20.0000 mg | ORAL_TABLET | Freq: Every day | ORAL | Status: DC
  Administered 2021-07-17 – 2021-07-24 (×7): 20 mg via ORAL
  Filled 2021-07-17 (×7): qty 2

## 2021-07-17 MED ORDER — ACETAMINOPHEN 650 MG RE SUPP: 650.0000 mg | Freq: Four times a day (QID) | RECTAL | Status: DC | PRN

## 2021-07-17 MED ORDER — SODIUM CHLORIDE 0.9 % IV BOLUS
500.0000 mL | Freq: Once | INTRAVENOUS | Status: AC
  Administered 2021-07-17: 500 mL via INTRAVENOUS

## 2021-07-17 MED ORDER — SODIUM CHLORIDE 0.9 % IV SOLN: INTRAVENOUS | Status: DC

## 2021-07-17 MED ORDER — ACETAMINOPHEN 325 MG PO TABS: 650.0000 mg | ORAL_TABLET | Freq: Four times a day (QID) | ORAL | Status: DC | PRN

## 2021-07-17 NOTE — ED Notes (Signed)
THE PTWENT TO THE BR AND VOIDED APPRO 50ML WITH SMALL PIECES OF IRREGULAR SHAPED STOOL  UNABLE TO SENT THE URINE DUE TO THE STOOL

## 2021-07-17 NOTE — ED Notes (Signed)
Pt had a bladder scan  only 2ml of urine in her bladder

## 2021-07-17 NOTE — ED Provider Notes (Signed)
[facility] EMERGENCY DEPARTMENT Provider Note   CSN: [id number] Arrival date & time: 07/16/21  1814     History Chief Complaint  Patient presents with   Abnormal Lab    Alisha [name] is a 80 y.o. female.  Patient with a history of CAD, CHF, aortic stenosis, diabetes, hyperthyroidism presenting at the request of her cardiologist Dr. Aundra Dubin.  She had lab work done today and was told she is in kidney failure.  Patient reports she has not urinated in the past 2 to 3 days without the sensation to need to.  Denies any abdominal pain.  Denies any chest pain.  Does have chronic shortness of breath that is worse with exertion.  She has a history of chronic GI bleeding and did receive a blood transfusion yesterday which she feels is why she was short of breath. Looks like her cardiologist has been adjusting the doses of her diuretics over the past several weeks.  Creatinine has been gradually trending upward.  She is currently on torsemide 40 mg twice daily.  Has not taken Zaroxolyn for several weeks. She was told by her nephrologist to stop this. Feels that her shortness of breath is unchanged.  States she has not had to urinate for the past several days. Did recently receive blood transfusion in [place] for chronic GI bleeding attributed to AVMs. Has noticed some increase in leg swelling and shortness of breath   The history is provided by the patient and a relative.  Abnormal Lab     Past Medical History:  Diagnosis Date   Aortic atherosclerosis ([place]) 08/30/2017   Aortic stenosis 08/30/2017   CAD (coronary artery disease), native coronary artery 08/30/2017   CHF (congestive heart failure) ([place])    Chronic diastolic heart failure ([place]) 08/30/2017   Diabetes mellitus with peripheral vascular disease ([place]) 07/14/2014   Hypertensive heart disease without CHF 08/30/2017   Hypothyroidism (acquired) 08/30/2017   Pulmonary hypertension ([place]) 07/04/2017    Patient  Active Problem List   Diagnosis Date Noted   Gout 05/14/2021   High risk medication use 05/14/2021   AVM (arteriovenous malformation) of small bowel, 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   Iron deficiency anemia due to chronic blood loss 02/21/2018   Tubular adenoma of colon 01/03/2018   Thrombophlebitis of superficial veins of right lower extremity 12/18/2017   CAD (coronary artery disease), native coronary artery 08/30/2017   Dyspnea 08/30/2017   Aortic atherosclerosis ([place]) 08/30/2017   Hypertensive heart disease without CHF 08/30/2017   Aortic stenosis 08/30/2017   Hypothyroidism (acquired) 08/30/2017   Chronic diastolic heart failure ([place]) 08/30/2017   Fungal dermatitis 07/06/2017   Pulmonary hypertension ([place]) 07/04/2017   Cataract, nuclear sclerotic, left eye 01/75/1025   Diastolic dysfunction 85/27/7824   Non-rheumatic mitral valve stenosis 05/17/2016   LAE (left atrial enlargement) 05/17/2016   Chronic venous insufficiency 04/11/2016   Diabetes mellitus with peripheral vascular disease ([place]) 07/14/2014   Type II or unspecified type diabetes mellitus with peripheral circulatory disorders, not stated as uncontrolled(250.70) 06/05/2014   PAD (peripheral artery disease) ([place]) 05/20/2014   GERD (gastroesophageal reflux disease) 11/12/2012   Chronic kidney disease, stage III (moderate) ([place]) 04/19/2010    Past Surgical History:  Procedure Laterality Date   ANKLE SURGERY     APPENDECTOMY     DILATION AND CURETTAGE OF UTERUS     RIGHT HEART CATH N/A 01/01/2019   Procedure: RIGHT HEART CATH;  Surgeon: Larey Dresser, MD;  Location: [place] [facility];  Service: Cardiovascular;  Laterality: N/A;   RIGHT/LEFT HEART CATH AND CORONARY ANGIOGRAPHY N/A 09/27/2017   Procedure: RIGHT/LEFT HEART CATH AND CORONARY ANGIOGRAPHY;  Surgeon: Sherren Mocha, MD;  Location: [place] [facility];  Service: Cardiovascular;  Laterality: N/A;   TEE WITHOUT CARDIOVERSION N/A 01/07/2021   Procedure: TRANSESOPHAGEAL  ECHOCARDIOGRAM (TEE);  Surgeon: Larey Dresser, MD;  Location: [facility] ENDOSCOPY;  Service: Cardiovascular;  Laterality: N/A;     OB History   No obstetric history on file.     Family History  Problem Relation Age of Onset   Diabetes Mother    Bleeding Disorder Mother    Heart disease Father    Alzheimer's disease Father    Diabetes Sister    Diabetes Brother    Heart disease Brother    Healthy Daughter    Healthy Son     Social History   Tobacco Use   Smoking status: Never   Smokeless tobacco: Never  Vaping Use   Vaping Use: Never used  Substance Use Topics   Alcohol use: Yes    Comment: Occasionally   Drug use: No    Home Medications Prior to Admission medications   Medication Sig Start Date End Date Taking? Authorizing Provider  ADEMPAS 2.5 MG TABS TAKE 1 TABLET THREE TIMES A DAY 05/24/21   Larey Dresser, MD  allopurinol (ZYLOPRIM) 100 MG tablet Take 1 tablet (100 mg total) by mouth daily. 05/26/21   Rice, Resa Miner, MD  amLODipine (NORVASC) 10 MG tablet Take 1 tablet (10 mg total) by mouth daily. 04/27/21   Larey Dresser, MD  atorvastatin (LIPITOR) 20 MG tablet TAKE 1 TABLET BY MOUTH IN  THE MORNING 01/12/21   Saguier, Percell Miller, PA-C  cetirizine (ZYRTEC) 10 MG tablet Take 10 mg by mouth daily as needed for allergies.    [provider]  fenofibrate 160 MG tablet Take 1 tablet (160 mg total) by mouth every morning. 11/17/20   Saguier, Percell Miller, PA-C  ferrous sulfate 325 (65 FE) MG tablet Take 1 tablet (325 mg total) by mouth daily with breakfast. Morning and night 11/17/20 05/26/22  Saguier, Percell Miller, PA-C  glucose blood (ACCU-CHEK AVIVA PLUS) test strip Check blood sugar TID dx: E11.9 04/07/21   Saguier, Percell Miller, PA-C  levothyroxine (SYNTHROID) 125 MCG tablet Take 1 tablet (125 mcg total) by mouth daily before breakfast. 11/17/20 05/26/22  Saguier, Percell Miller, PA-C  loperamide (IMODIUM A-D) 2 MG tablet Take 2 mg by mouth daily as needed for diarrhea or loose stools.     [provider]  loratadine (CLARITIN) 10 MG tablet Take 10 mg by mouth daily as needed for allergies.     [provider]  metFORMIN (GLUCOPHAGE) 500 MG tablet Take 1 tablet (500 mg total) by mouth 2 (two) times daily with a meal. Patient taking differently: Take 500 mg by mouth. Patient takes once a day unless her blood sugar reading is 180 or higher. 11/17/20   Saguier, Percell Miller, PA-C  metFORMIN (GLUCOPHAGE) 500 MG tablet Take 500 mg by mouth. Patient takes 1 tablet a day unless her blood sugar reading is 180 or higher.    [provider]  metolazone (ZAROXOLYN) 2.5 MG tablet Take 1 tablet (2.5 mg total) by mouth once a week. Take potassium 54meq with this 06/08/20   Larey Dresser, MD  metoprolol succinate (TOPROL XL) 25 MG 24 hr tablet Take 1 tablet (25 mg total) by mouth 2 (two) times daily. 11/17/20 11/17/21  Saguier, Percell Miller, PA-C  Multiple  Vitamins-Minerals (PRESERVISION AREDS 2 PO) Take 1 capsule by mouth 2 (two) times daily. As needed    [provider]  pantoprazole (PROTONIX) 40 MG tablet TAKE 1 TABLET BY MOUTH  TWICE DAILY 04/07/21   Saguier, Percell Miller, PA-C  potassium chloride (KLOR-CON) 10 MEQ tablet Take 3 tablets (30 mEq total) by mouth daily. Take an extra 58meq with metolazone 07/09/21   Larey Dresser, MD  torsemide (DEMADEX) 20 MG tablet Take 2 tablets (40 mg total) by mouth 2 (two) times daily. 07/05/21   Joette Catching, PA-C  traMADol (ULTRAM) 50 MG tablet Take 1 tablet (50 mg total) by mouth every 6 (six) hours as needed for moderate pain or severe pain. 05/10/21   Saguier, Percell Miller, PA-C  UPTRAVI 1600 MCG TABS Take 1 tablet (1,600 mcg total) by mouth 2 (two) times daily. Take with 800 for a total of 2400 mg 01/22/21   Larey Dresser, MD  UPTRAVI 800 MCG TABS Take 1 tablet (800 mcg total) by mouth 2 (two) times daily. Take with 1600 mcg for a total of 2400 mg 01/22/21   Larey Dresser, MD    Allergies    Other, Adhesive [tape], and  Nickel  Review of Systems   Review of Systems  Constitutional:  Positive for fatigue. Negative for activity change, appetite change and fever.  HENT:  Negative for congestion and sinus pain.   Respiratory:  Positive for shortness of breath. Negative for chest tightness.   Cardiovascular:  Negative for chest pain.  Gastrointestinal:  Negative for abdominal pain, nausea and vomiting.  Genitourinary:  Positive for decreased urine volume and difficulty urinating. Negative for hematuria.  Musculoskeletal:  Negative for arthralgias and myalgias.  Skin:  Negative for wound.  Neurological:  Negative for dizziness, weakness and headaches.   all other systems are negative except as noted in the HPI and PMH.   Physical Exam Updated Vital Signs BP (!) 123/53 (BP Location: Left Arm)   Pulse 76   Temp 97.6 F (36.4 C) (Oral)   Resp 16   SpO2 93%   Physical Exam Vitals and nursing note reviewed.  Constitutional:      General: She is not in acute distress.    Appearance: She is well-developed.  HENT:     Head: Normocephalic and atraumatic.     Mouth/Throat:     Pharynx: No oropharyngeal exudate.  Eyes:     Conjunctiva/sclera: Conjunctivae normal.     Pupils: Pupils are equal, round, and reactive to light.  Neck:     Comments: No meningismus. Cardiovascular:     Rate and Rhythm: Normal rate and regular rhythm.     Heart sounds: Murmur heard.  Pulmonary:     Effort: Pulmonary effort is normal. No respiratory distress.     Breath sounds: Normal breath sounds.  Abdominal:     Palpations: Abdomen is soft.     Tenderness: There is no abdominal tenderness. There is no guarding or rebound.  Musculoskeletal:        General: No tenderness. Normal range of motion.     Cervical back: Normal range of motion and neck supple.     Right lower leg: Edema present.     Left lower leg: Edema present.  Skin:    General: Skin is warm.  Neurological:     Mental Status: She is alert and oriented to  person, place, and time.     Cranial Nerves: No cranial nerve deficit.  Motor: No abnormal muscle tone.     Coordination: Coordination normal.     Comments:  5/5 strength throughout. CN 2-12 intact.Equal grip strength.   Psychiatric:        Behavior: Behavior normal.    ED Results / Procedures / Treatments   Labs (all labs ordered are listed, but only abnormal results are displayed) Labs Reviewed  CBC WITH DIFFERENTIAL/PLATELET - Abnormal; Notable for the following components:      Result Value   RBC 3.25 (*)    Hemoglobin 8.8 (*)    HCT 28.0 (*)    RDW 19.3 (*)    Lymphs Abs 0.6 (*)    Abs Immature Granulocytes 0.08 (*)    All other components within normal limits  COMPREHENSIVE METABOLIC PANEL - Abnormal; Notable for the following components:   Sodium 134 (*)    CO2 13 (*)    Glucose, Bld 146 (*)    BUN 97 (*)    Creatinine, Ser 4.30 (*)    Calcium 8.4 (*)    Total Protein 6.4 (*)    Albumin 3.4 (*)    AST 12 (*)    GFR, Estimated 10 (*)    All other components within normal limits  URINALYSIS, ROUTINE W REFLEX MICROSCOPIC    EKG EKG Interpretation  Date/Time:  Saturday July 17 2021 02:36:23 EDT Ventricular Rate:  80 PR Interval:  155 QRS Duration: 101 QT Interval:  433 QTC Calculation: 500 R Axis:   125 Text Interpretation: Sinus or ectopic atrial rhythm Right axis deviation Borderline prolonged QT interval No significant change was found Confirmed by Ezequiel Essex [phone]) on 07/17/2021 3:04:00 AM  Radiology DG Chest 2 View  Result Date: 07/16/2021 CLINICAL DATA:  Shortness of breath and decreasing urinary function EXAM: CHEST - 2 VIEW COMPARISON:  01/26/2021, CT from 02/15/2021 FINDINGS: Cardiac shadow is mildly prominent. Aortic calcifications are noted. The lungs are well aerated bilaterally. Minimal pleural effusions are seen bilaterally. No acute bony abnormality is seen. Known bilateral pulmonary nodules are not well appreciated on this exam.  IMPRESSION: Small pleural effusions bilaterally. Known pulmonary nodules are not well appreciated. Electronically Signed   By: Inez Catalina M.D.   On: 07/16/2021 20:38   CT Renal Stone Study  Result Date: 07/16/2021 CLINICAL DATA:  Acute renal failure. EXAM: CT ABDOMEN AND PELVIS WITHOUT CONTRAST TECHNIQUE: Multidetector CT imaging of the abdomen and pelvis was performed following the standard protocol without IV contrast. COMPARISON:  None. FINDINGS: Lower chest: Mild atelectasis is seen within the bilateral lung bases. Small bilateral pleural effusions are noted, right greater than left. Hepatobiliary: No focal liver abnormality is seen. Subcentimeter gallstones are seen within the lumen of an otherwise normal-appearing gallbladder. There is no evidence of biliary dilatation. Pancreas: Unremarkable. No pancreatic ductal dilatation or surrounding inflammatory changes. Spleen: Normal in size without focal abnormality. Adrenals/Urinary Tract: Adrenal glands are unremarkable. Kidneys are normal in size, without renal calculi or hydronephrosis. 7 mm and 3 mm foci of fat density are noted within the mid right kidney. A small amount of air is seen within the lumen of the urinary bladder. A very mild amount of surrounding inflammatory fat stranding is seen without evidence of urinary bladder wall thickening Stomach/Bowel: There is a small hiatal hernia. The appendix is surgically absent. No evidence of bowel wall thickening, distention, or inflammatory changes. Noninflamed diverticula are seen throughout the large bowel. Vascular/Lymphatic: Aortic atherosclerosis. No enlarged abdominal or pelvic lymph nodes. Reproductive: Uterus and bilateral adnexa are  unremarkable. Other: No abdominal wall hernia or abnormality. No abdominopelvic ascites. Musculoskeletal: Multilevel degenerative changes seen throughout the lumbar spine, most prominent at the levels of L4-L5 and L5-S1. IMPRESSION: 1. Findings involving the urinary  bladder which may represent sequelae associated with cystitis. Correlation with urinalysis is recommended. 2. Small bilateral pleural effusions, right greater than left. 3. Cholelithiasis. 4. Colonic diverticulosis. 5. Small hiatal hernia. 6. Aortic atherosclerosis. Aortic Atherosclerosis (ICD10-I70.0). Electronically Signed   By: Virgina Norfolk M.D.   On: 07/16/2021 20:58    Procedures Procedures   Medications Ordered in ED Medications  sodium chloride 0.9 % bolus 500 mL (has no administration in time range)    ED Course  I have reviewed the triage vital signs and the nursing notes.  Pertinent labs & imaging results that were available during my care of the patient were reviewed by me and considered in my medical decision making (see chart for details).    MDM Rules/Calculators/A&P                          Patient sent by cardiologist with elevated kidney function.  Decreased urination.  Vital stable, no distress.  Cr Has been trending up from 1.8 in mid-October to 4.4 today. Bladder scan <67mL.  CT done in triage shows no obstruction or hydronephrosis. Gentle hydration begun.  PAtient does have history of CHF and pulmonary hypertension.  Admission for AKI on CKD d/w Dr. Myna Hidalgo. Also with evidence of CHF and volume overload on CXR however. May be developing some component of cardiorenal syndrome.  Final Clinical Impression(s) / ED Diagnoses Final diagnoses:  None    Rx / DC Orders ED Discharge Orders     None        Amanat Hackel, Annie Main, MD 07/17/21 [phone]

## 2021-07-17 NOTE — Progress Notes (Signed)
[facility] PROGRESS NOTE    Progress Note  Alisha [name]  XVQ:[id number] DOB: 08-Aug-1941 DOA: 07/16/2021 PCP: Mackie Pai, PA-C     Brief Narrative:   Alisha [name] is an 80 y.o. 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  She relates that she had her diuretic recently increased as she was becoming short of breath and requiring oxygen.  Upon arrival to the ED she was satting in the low 90s blood pressure 117/58 bicarb of 13 creatinine of 4 with a BUN of 97 (with a baseline creatinine of 1.3-1.5)   Assessment/Plan:   Acute renal failure superimposed on stage 4 chronic kidney disease/anion gap metabolic acidosis: Likely prerenal azotemia in the setting of torsemide and metolazone, with a bicarb of 13. CT of the chest showed small bilateral pleural effusion choledocholithiasis diverticulosis no hydronephrosis. Continue IV fluids for the next 12 hours recheck a basic metabolic panel this afternoon and tomorrow morning. Started on bicarbonate tablets.  Pulmonary hypertension with RV failure/chronic heart failure: Her diuretics were held on 07/09/2021 by direction of her nephrologist, her BMP is slightly elevated.  Normocytic anemia in the setting of chronic GI blood loss: Attributed to AVMs had received 1 packed red blood cells on 07/15/2021. Her hemoglobin on admission is 8.8.,  4 weeks ago was 10.8.  Essential hypertension: Holding antihypertensive medication as her blood pressure was borderline low on admission she was given half a liter of normal saline was started on gentle hydration.  Prolonged QTC: Monitor on telemetry mag level pending.    DVT prophylaxis: lovenox Family Communication:none Status is: Inpatient  Remains inpatient appropriate because: Acute  kidney injury.    Code Status:     Code Status Orders  (From admission, onward)           Start     Ordered   07/17/21 0622  Full code  Continuous        07/17/21 0622           Code Status History     Date Active Date Inactive Code Status Order ID Comments User Context   01/01/2019 1525 01/01/2019 2032 Full Code [id number]  Larey Dresser, MD Inpatient   09/27/2017 1635 09/27/2017 2217 Full Code [id number]  Sherren Mocha, MD Inpatient         IV Access:   Peripheral IV   Procedures and diagnostic studies:   DG Chest 2 View  Result Date: 07/16/2021 CLINICAL DATA:  Shortness of breath and decreasing urinary function EXAM: CHEST - 2 VIEW COMPARISON:  01/26/2021, CT from 02/15/2021 FINDINGS: Cardiac shadow is mildly prominent. Aortic calcifications are noted. The lungs are well aerated bilaterally. Minimal pleural effusions are seen bilaterally. No acute bony abnormality is seen. Known bilateral pulmonary nodules are not well appreciated on this exam. IMPRESSION: Small pleural effusions bilaterally. Known pulmonary nodules are not well appreciated. Electronically Signed   By: Inez Catalina M.D.   On: 07/16/2021 20:38   CT Renal Stone Study  Result Date: 07/16/2021 CLINICAL DATA:  Acute renal failure. EXAM: CT ABDOMEN AND PELVIS WITHOUT CONTRAST TECHNIQUE: Multidetector CT imaging of the abdomen and pelvis was performed following the standard protocol without IV contrast. COMPARISON:  None. FINDINGS: Lower chest: Mild atelectasis is seen within the bilateral lung bases. Small bilateral pleural effusions are noted, right greater than  left. Hepatobiliary: No focal liver abnormality is seen. Subcentimeter gallstones are seen within the lumen of an otherwise normal-appearing gallbladder. There is no evidence of biliary dilatation. Pancreas: Unremarkable. No pancreatic ductal dilatation or surrounding inflammatory changes. Spleen: Normal in size without focal abnormality.  Adrenals/Urinary Tract: Adrenal glands are unremarkable. Kidneys are normal in size, without renal calculi or hydronephrosis. 7 mm and 3 mm foci of fat density are noted within the mid right kidney. A small amount of air is seen within the lumen of the urinary bladder. A very mild amount of surrounding inflammatory fat stranding is seen without evidence of urinary bladder wall thickening Stomach/Bowel: There is a small hiatal hernia. The appendix is surgically absent. No evidence of bowel wall thickening, distention, or inflammatory changes. Noninflamed diverticula are seen throughout the large bowel. Vascular/Lymphatic: Aortic atherosclerosis. No enlarged abdominal or pelvic lymph nodes. Reproductive: Uterus and bilateral adnexa are unremarkable. Other: No abdominal wall hernia or abnormality. No abdominopelvic ascites. Musculoskeletal: Multilevel degenerative changes seen throughout the lumbar spine, most prominent at the levels of L4-L5 and L5-S1. IMPRESSION: 1. Findings involving the urinary bladder which may represent sequelae associated with cystitis. Correlation with urinalysis is recommended. 2. Small bilateral pleural effusions, right greater than left. 3. Cholelithiasis. 4. Colonic diverticulosis. 5. Small hiatal hernia. 6. Aortic atherosclerosis. Aortic Atherosclerosis (ICD10-I70.0). Electronically Signed   By: Virgina Norfolk M.D.   On: 07/16/2021 20:58     Medical Consultants:   None.   Subjective:    Alisha [name] no new complaints loss of appetite  Objective:    Vitals:   07/17/21 0313 07/17/21 0600 07/17/21 0815 07/17/21 0843  BP: (!) 117/58 (!) 120/58 (!) 115/48 (!) 127/53  Pulse: 77 77 79 90  Resp: 18 19 20 18   Temp:   98.1 F (36.7 C) 98 F (36.7 C)  TempSrc:   Oral Oral  SpO2: 95% (!) 88% 97% 94%  Weight:    70.3 kg  Height:    5\' 5"  (1.651 m)   SpO2: 94 % O2 Flow Rate (L/min): 2 L/min  No intake or output data in the 24 hours ending 07/17/21 0931 Filed Weights    07/17/21 0843  Weight: 70.3 kg    Exam: General exam: In no acute distress. Respiratory system: Good air movement and clear to auscultation. Cardiovascular system: S1 & S2 heard, RRR. +JVD Gastrointestinal system: Abdomen is nondistended, soft and nontender.  Extremities: No pedal edema. Skin: No rashes, lesions or ulcers Data Reviewed:    Labs: Basic Metabolic Panel: Recent Labs  Lab 07/12/21 1135 07/16/21 1108 07/16/21 2004  NA 139 135 134*  K 3.8 3.6 3.8  CL 108 107 106  CO2 17* 14* 13*  GLUCOSE 197* 197* 146*  BUN 105* 99* 97*  CREATININE 3.56* 4.37* 4.30*  CALCIUM 9.0 8.6* 8.4*   GFR Estimated Creatinine Clearance: 10.6 mL/min (A) (by C-G formula based on SCr of 4.3 mg/dL (H)). Liver Function Tests: Recent Labs  Lab 07/16/21 2004  AST 12*  ALT 9  ALKPHOS 54  BILITOT 0.6  PROT 6.4*  ALBUMIN 3.4*   No results for input(s): LIPASE, AMYLASE in the last 168 hours. No results for input(s): AMMONIA in the last 168 hours. Coagulation profile No results for input(s): INR, PROTIME in the last 168 hours. COVID-19 Labs  No results for input(s): DDIMER, FERRITIN, LDH, CRP in the last 72 hours.  Lab Results  Component Value Date   [place] NEGATIVE 01/05/2021    CBC: Recent Labs  Lab 07/16/21 2004  WBC 8.6  NEUTROABS 7.2  HGB 8.8*  HCT 28.0*  MCV 86.2  PLT 339   Cardiac Enzymes: No results for input(s): CKTOTAL, CKMB, CKMBINDEX, TROPONINI in the last 168 hours. BNP (last 3 results) Recent Labs    01/26/21 1313  PROBNP 823.0*   CBG: No results for input(s): GLUCAP in the last 168 hours. D-Dimer: No results for input(s): DDIMER in the last 72 hours. Hgb A1c: No results for input(s): HGBA1C in the last 72 hours. Lipid Profile: No results for input(s): CHOL, HDL, LDLCALC, TRIG, CHOLHDL, LDLDIRECT in the last 72 hours. Thyroid function studies: No results for input(s): TSH, T4TOTAL, T3FREE, THYROIDAB in the last 72 hours.  Invalid  input(s): FREET3 Anemia work up: No results for input(s): VITAMINB12, FOLATE, FERRITIN, TIBC, IRON, RETICCTPCT in the last 72 hours. Sepsis Labs: Recent Labs  Lab 07/16/21 2004  WBC 8.6   Microbiology No results found for this or any previous visit (from the past 240 hour(s)).   Medications:    atorvastatin  20 mg Oral Daily   levothyroxine  125 mcg Oral QAC breakfast   pantoprazole  40 mg Oral BID   Selexipag  1,600 mcg Oral BID   Selexipag  800 mcg Oral BID   sodium bicarbonate  650 mg Oral TID   sodium chloride flush  3 mL Intravenous Q12H   Continuous Infusions:    LOS: 0 days   Charlynne Cousins  [facility]  07/17/2021, 9:31 AM

## 2021-07-17 NOTE — ED Notes (Signed)
aDMITTING AT THE BEDSIDE

## 2021-07-17 NOTE — H&P (Signed)
History and Physical    ZACARI RADICK WCB:[id number] DOB: 11-10-1940 DOA: 07/16/2021  PCP: Mackie Pai, PA-C   Patient coming from: Home   Chief Complaint: Abnormal labs   HPI: MINNETTE MERIDA is a pleasant 80 y.o. 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  Patient states that she had recently been requiring home oxygen, had her diuretics temporarily increased, was able to come off of the oxygen, but was then instructed by her nephrologist to hold her diuretics altogether on 07/09/2021 due to concerns on her blood work.  Leg swelling has returned since she has been holding her diuretics, she has become dyspneic with exertion, but attributes the dyspnea to anemia for which she was transfused 1 unit RBC on 07/15/2021.  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  ED Course: Upon arrival to the ED, patient is found to be afebrile, saturating low 90s on room air, and with blood pressure 117/58.  EKG features sinus or ectopic atrial rhythm with QTC 500 ms.  Chemistry panel notable for BUN 97, bicarbonate 13, and creatinine 4.30.  CBC with normocytic anemia, hemoglobin 8.8.  BNP elevated to 1249.  Patient was given 500 cc of saline in the ED.  Review of Systems:  All other systems reviewed and apart from HPI, are negative.  Past Medical History:  Diagnosis Date   Aortic atherosclerosis ([place]) 08/30/2017   Aortic stenosis 08/30/2017   CAD (coronary artery disease), native coronary artery 08/30/2017   CHF (congestive heart failure) (HCC)    Chronic diastolic heart failure ([place]) 08/30/2017   Diabetes mellitus with peripheral vascular disease ([place]) 07/14/2014   Hypertensive heart disease  without CHF 08/30/2017   Hypothyroidism (acquired) 08/30/2017   Pulmonary hypertension ([place]) 07/04/2017    Past Surgical History:  Procedure Laterality Date   ANKLE SURGERY     APPENDECTOMY     DILATION AND CURETTAGE OF UTERUS     RIGHT HEART CATH N/A 01/01/2019   Procedure: RIGHT HEART CATH;  Surgeon: Larey Dresser, MD;  Location: [place] [facility];  Service: Cardiovascular;  Laterality: N/A;   RIGHT/LEFT HEART CATH AND CORONARY ANGIOGRAPHY N/A 09/27/2017   Procedure: RIGHT/LEFT HEART CATH AND CORONARY ANGIOGRAPHY;  Surgeon: Sherren Mocha, MD;  Location: [place] [facility];  Service: Cardiovascular;  Laterality: N/A;   TEE WITHOUT CARDIOVERSION N/A 01/07/2021   Procedure: TRANSESOPHAGEAL ECHOCARDIOGRAM (TEE);  Surgeon: Larey Dresser, MD;  Location[facility] END[facility];  Service: Cardiovascular;  Laterality: N/A;    Social History:   reports that she has never smoked. She has never used smokeless tobacco. She reports current alcohol use. She reports that she does not use drugs.  Allergies  Allergen Reactions   Other Palpitations    Surgical metal (stents, pins, plates) keeps pt from healing   Adhesive [Tape] Other (See Comments)    Irritates skin with rash   Nickel Rash    Family History  Problem Relation Age of Onset   Diabetes Mother    Bleeding Disorder Mother    Heart disease Father    Alzheimer's disease Father    Diabetes Sister    Diabetes Brother    Heart disease Brother    Healthy Daughter    Healthy Son  Prior to Admission medications   Medication Sig Start Date End Date Taking? Authorizing Provider  ADEMPAS 2.5 MG TABS TAKE 1 TABLET THREE TIMES A DAY Patient taking differently: Take 2.5 mg by mouth 3 (three) times daily. 05/24/21  Yes Larey Dresser, MD  amLODipine (NORVASC) 10 MG tablet Take 1 tablet (10 mg total) by mouth daily. 04/27/21  Yes Larey Dresser, MD  atorvastatin (LIPITOR) 20 MG tablet TAKE 1 TABLET BY MOUTH IN  THE MORNING Patient  taking differently: Take 20 mg by mouth daily. 01/12/21  Yes Saguier, Percell Miller, PA-C  cetirizine (ZYRTEC) 10 MG tablet Take 10 mg by mouth daily as needed for allergies.   Yes [provider]  cholecalciferol (VITAMIN D) 25 MCG (1000 UNIT) tablet Take 1,000 Units by mouth daily.   Yes [provider]  fenofibrate 160 MG tablet Take 1 tablet (160 mg total) by mouth every morning. 11/17/20  Yes Saguier, Percell Miller, PA-C  glucose blood (ACCU-CHEK AVIVA PLUS) test strip Check blood sugar TID dx: E11.9 04/07/21  Yes Saguier, Percell Miller, PA-C  levothyroxine (SYNTHROID) 125 MCG tablet Take 1 tablet (125 mcg total) by mouth daily before breakfast. 11/17/20 05/26/22 Yes Saguier, Percell Miller, PA-C  loperamide (IMODIUM A-D) 2 MG tablet Take 2 mg by mouth daily as needed for diarrhea or loose stools.   Yes [provider]  loratadine (CLARITIN) 10 MG tablet Take 10 mg by mouth daily as needed for allergies.    Yes [provider]  metFORMIN (GLUCOPHAGE) 500 MG tablet Take 1 tablet (500 mg total) by mouth 2 (two) times daily with a meal. Patient taking differently: Take 500 mg by mouth See admin instructions. 500 mg every morning 500 mg in the afternoon if  blood sugar reading is 180 or higher. 11/17/20  Yes Saguier, Percell Miller, PA-C  metolazone (ZAROXOLYN) 2.5 MG tablet Take 1 tablet (2.5 mg total) by mouth once a week. Take potassium 62meq with this Patient taking differently: Take 2.5 mg by mouth every Saturday at 6 PM. 06/08/20  Yes Larey Dresser, MD  metoprolol succinate (TOPROL XL) 25 MG 24 hr tablet Take 1 tablet (25 mg total) by mouth 2 (two) times daily. 11/17/20 11/17/21 Yes Saguier, Percell Miller, PA-C  Multiple Vitamins-Minerals (PRESERVISION AREDS 2 PO) Take 1 capsule by mouth 2 (two) times daily.   Yes [provider]  pantoprazole (PROTONIX) 40 MG tablet TAKE 1 TABLET BY MOUTH  TWICE DAILY Patient taking differently: Take 40 mg by mouth 2 (two) times daily. 04/07/21  Yes Saguier, Percell Miller, PA-C   torsemide (DEMADEX) 20 MG tablet Take 2 tablets (40 mg total) by mouth 2 (two) times daily. Patient taking differently: Take 20 mg by mouth 2 (two) times daily. 07/05/21  Yes Joette Catching, PA-C  UPTRAVI 1600 MCG TABS Take 1 tablet (1,600 mcg total) by mouth 2 (two) times daily. Take with 800 for a total of 2400 mg 01/22/21  Yes Larey Dresser, MD  UPTRAVI 800 MCG TABS Take 1 tablet (800 mcg total) by mouth 2 (two) times daily. Take with 1600 mcg for a total of 2400 mg 01/22/21  Yes Larey Dresser, MD  allopurinol (ZYLOPRIM) 100 MG tablet Take 1 tablet (100 mg total) by mouth daily. Patient not taking: No sig reported 05/26/21   Collier Salina, MD  ferrous sulfate 325 (65 FE) MG tablet Take 1 tablet (325 mg total) by mouth daily with breakfast. Morning and night 11/17/20 05/26/22  Saguier, Percell Miller, PA-C  potassium chloride (KLOR-CON)  10 MEQ tablet Take 3 tablets (30 mEq total) by mouth daily. Take an extra 76meq with metolazone Patient taking differently: Take 30 mEq by mouth See admin instructions. 30 meq once daily Take an extra 15meq with metolazone 07/09/21   Larey Dresser, MD  traMADol (ULTRAM) 50 MG tablet Take 1 tablet (50 mg total) by mouth every 6 (six) hours as needed for moderate pain or severe pain. Patient not taking: No sig reported 05/10/21   Saguier, Percell Miller, PA-C  vancomycin (VANCOCIN) 125 MG capsule Take 125 mg by mouth See admin instructions. Qid x 14 days, then 150 mg every third day x 10 days Patient not taking: Reported on 07/17/2021 06/21/21   [provider]    Physical Exam: Vitals:   07/17/21 0200 07/17/21 0245 07/17/21 0313 07/17/21 0600  BP:   (!) 117/58 (!) 120/58  Pulse:  76 77 77  Resp: 17 (!) 21 18 19   Temp:      TempSrc:      SpO2:  95% 95% (!) 88%    Constitutional: NAD, calm  Eyes: PERTLA, lids and conjunctivae normal ENMT: Mucous membranes are moist. Posterior pharynx clear of any exudate or lesions.   Neck: supple, no masses   Respiratory: no wheezing, no crackles. No accessory muscle use.  Cardiovascular: S1 & S2 heard, regular rate and rhythm. Pretibial pitting edema bilaterally.   Abdomen: No distension, no tenderness, soft. Bowel sounds active.  Musculoskeletal: no clubbing / cyanosis. No joint deformity upper and lower extremities.   Skin: no significant rashes, lesions, ulcers. Warm, dry, well-perfused. Neurologic: Hearing deficit, CN 2-12 grossly intact otherwise. Moving all extremities. Alert and oriented.  Psychiatric: Very pleasant. Cooperative.    Labs and Imaging on Admission: I have personally reviewed following labs and imaging studies  CBC: Recent Labs  Lab 07/16/21 2004  WBC 8.6  NEUTROABS 7.2  HGB 8.8*  HCT 28.0*  MCV 86.2  PLT 834   Basic Metabolic Panel: Recent Labs  Lab 07/12/21 1135 07/16/21 1108 07/16/21 2004  NA 139 135 134*  K 3.8 3.6 3.8  CL 108 107 106  CO2 17* 14* 13*  GLUCOSE 197* 197* 146*  BUN 105* 99* 97*  CREATININE 3.56* 4.37* 4.30*  CALCIUM 9.0 8.6* 8.4*   GFR: CrCl cannot be calculated (Unknown ideal weight.). Liver Function Tests: Recent Labs  Lab 07/16/21 2004  AST 12*  ALT 9  ALKPHOS 54  BILITOT 0.6  PROT 6.4*  ALBUMIN 3.4*   No results for input(s): LIPASE, AMYLASE in the last 168 hours. No results for input(s): AMMONIA in the last 168 hours. Coagulation Profile: No results for input(s): INR, PROTIME in the last 168 hours. Cardiac Enzymes: No results for input(s): CKTOTAL, CKMB, CKMBINDEX, TROPONINI in the last 168 hours. BNP (last 3 results) Recent Labs    01/26/21 1313  PROBNP 823.0*   HbA1C: No results for input(s): HGBA1C in the last 72 hours. CBG: No results for input(s): GLUCAP in the last 168 hours. Lipid Profile: No results for input(s): CHOL, HDL, LDLCALC, TRIG, CHOLHDL, LDLDIRECT in the last 72 hours. Thyroid Function Tests: No results for input(s): TSH, T4TOTAL, FREET4, T3FREE, THYROIDAB in the last 72 hours. Anemia  Panel: No results for input(s): VITAMINB12, FOLATE, FERRITIN, TIBC, IRON, RETICCTPCT in the last 72 hours. Urine analysis: No results found for: COLORURINE, APPEARANCEUR, LABSPEC, PHURINE, GLUCOSEU, HGBUR, BILIRUBINUR, KETONESUR, PROTEINUR, UROBILINOGEN, NITRITE, LEUKOCYTESUR Sepsis Labs: @LABRCNTIP (procalcitonin:4,lacticidven:4) )No results found for this or any previous visit (from the past 240  hour(s)).   Radiological Exams on Admission: DG Chest 2 View  Result Date: 07/16/2021 CLINICAL DATA:  Shortness of breath and decreasing urinary function EXAM: CHEST - 2 VIEW COMPARISON:  01/26/2021, CT from 02/15/2021 FINDINGS: Cardiac shadow is mildly prominent. Aortic calcifications are noted. The lungs are well aerated bilaterally. Minimal pleural effusions are seen bilaterally. No acute bony abnormality is seen. Known bilateral pulmonary nodules are not well appreciated on this exam. IMPRESSION: Small pleural effusions bilaterally. Known pulmonary nodules are not well appreciated. Electronically Signed   By: Inez Catalina M.D.   On: 07/16/2021 20:38   CT Renal Stone Study  Result Date: 07/16/2021 CLINICAL DATA:  Acute renal failure. EXAM: CT ABDOMEN AND PELVIS WITHOUT CONTRAST TECHNIQUE: Multidetector CT imaging of the abdomen and pelvis was performed following the standard protocol without IV contrast. COMPARISON:  None. FINDINGS: Lower chest: Mild atelectasis is seen within the bilateral lung bases. Small bilateral pleural effusions are noted, right greater than left. Hepatobiliary: No focal liver abnormality is seen. Subcentimeter gallstones are seen within the lumen of an otherwise normal-appearing gallbladder. There is no evidence of biliary dilatation. Pancreas: Unremarkable. No pancreatic ductal dilatation or surrounding inflammatory changes. Spleen: Normal in size without focal abnormality. Adrenals/Urinary Tract: Adrenal glands are unremarkable. Kidneys are normal in size, without renal calculi  or hydronephrosis. 7 mm and 3 mm foci of fat density are noted within the mid right kidney. A small amount of air is seen within the lumen of the urinary bladder. A very mild amount of surrounding inflammatory fat stranding is seen without evidence of urinary bladder wall thickening Stomach/Bowel: There is a small hiatal hernia. The appendix is surgically absent. No evidence of bowel wall thickening, distention, or inflammatory changes. Noninflamed diverticula are seen throughout the large bowel. Vascular/Lymphatic: Aortic atherosclerosis. No enlarged abdominal or pelvic lymph nodes. Reproductive: Uterus and bilateral adnexa are unremarkable. Other: No abdominal wall hernia or abnormality. No abdominopelvic ascites. Musculoskeletal: Multilevel degenerative changes seen throughout the lumbar spine, most prominent at the levels of L4-L5 and L5-S1. IMPRESSION: 1. Findings involving the urinary bladder which may represent sequelae associated with cystitis. Correlation with urinalysis is recommended. 2. Small bilateral pleural effusions, right greater than left. 3. Cholelithiasis. 4. Colonic diverticulosis. 5. Small hiatal hernia. 6. Aortic atherosclerosis. Aortic Atherosclerosis (ICD10-I70.0). Electronically Signed   By: Virgina Norfolk M.D.   On: 07/16/2021 20:58    EKG: Independently reviewed. Sinus or ectopic atrial rhythm, QTc 500 ms.    Assessment/Plan   1. AKI superimposed on CKD IV; metabolic acidosis  - BUN is 97 and SCr 4.30 on admission, up from 61 & 2.44 on 07/05/21  - Serum bicarbonate is 13  - No hydronephrosis on CT in ED  - She had taken increased diuretics recently but then instructed by nephrologist to hold altogether on 07/09/21  - 500 cc NS given in ED  - Check UA and urine chemistries, continue to hold diuretics for now, start bicarbonate, renally-dose medications, repeat serum chemistries in am    2. Pulmonary HTN with RV failure; chronic diastolic CHF   - [facility]  - EF was preserved in April 2022  - She has been holding her diuretics since 07/09/21 at direction of her nephrologist and  - BNP is higher than priors and peripheral edema has returned since holding diuretics  - Continue to hold diuretics pending repeat chem panel    3. Anemia; chronic GI blood loss  - Pt reports chronic GIB  attributed to AVMs, had 1 unit RBC transfused on 07/15/21  - Hgb 8.8 on admission, no overt bleeding  - Monitor, transfuse as needed    4. Hypertension  - Low-normal BP in ED, will hold scheduled antihypertensives for now  5. Prolonged QT interval  - QTc is 500 ms on admission  - Continue cardiac monitoring, check mag level, minimize QT-prolonging medications    DVT prophylaxis: SCDs  Code Status: Full  Level of Care:  Level of care: Telemetry Cardiac Family Communication: None present  Disposition Plan:  Patient is from: home  Anticipated d/c is to: home  Anticipated d/c date is: 07/20/21 Patient currently: Pending improvement in renal function or inpatient nephrology consult  Consults called: None  Admission status: Inpatient     Vianne Bulls, MD [facility]  07/17/2021, 6:58 AM

## 2021-07-18 ENCOUNTER — Inpatient Hospital Stay (HOSPITAL_COMMUNITY): Payer: Medicare Other

## 2021-07-18 DIAGNOSIS — N179 Acute kidney failure, unspecified: Principal | ICD-10-CM

## 2021-07-18 DIAGNOSIS — I5032 Chronic diastolic (congestive) heart failure: Secondary | ICD-10-CM | POA: Diagnosis not present

## 2021-07-18 DIAGNOSIS — R0609 Other forms of dyspnea: Secondary | ICD-10-CM

## 2021-07-18 DIAGNOSIS — I272 Pulmonary hypertension, unspecified: Secondary | ICD-10-CM

## 2021-07-18 DIAGNOSIS — K5520 Angiodysplasia of colon without hemorrhage: Secondary | ICD-10-CM | POA: Diagnosis not present

## 2021-07-18 DIAGNOSIS — N184 Chronic kidney disease, stage 4 (severe): Secondary | ICD-10-CM | POA: Diagnosis not present

## 2021-07-18 LAB — ECHOCARDIOGRAM COMPLETE
AR max vel: 1.17 cm2
AV Area VTI: 1.11 cm2
AV Area mean vel: 1.04 cm2
AV Mean grad: 27.6 mmHg
AV Peak grad: 44.8 mmHg
Ao pk vel: 3.35 m/s
Area-P 1/2: 3.65 cm2
Height: 65 in
P 1/2 time: 314 msec
S' Lateral: 2.5 cm
Weight: 2488 oz

## 2021-07-18 LAB — BASIC METABOLIC PANEL
Anion gap: 12 (ref 5–15)
BUN: 89 mg/dL — ABNORMAL HIGH (ref 8–23)
CO2: 14 mmol/L — ABNORMAL LOW (ref 22–32)
Calcium: 8.3 mg/dL — ABNORMAL LOW (ref 8.9–10.3)
Chloride: 111 mmol/L (ref 98–111)
Creatinine, Ser: 3.78 mg/dL — ABNORMAL HIGH (ref 0.44–1.00)
GFR, Estimated: 12 mL/min — ABNORMAL LOW (ref >60)
Glucose, Bld: 131 mg/dL — ABNORMAL HIGH (ref 70–99)
Potassium: 3.7 mmol/L (ref 3.5–5.1)
Sodium: 137 mmol/L (ref 135–145)

## 2021-07-18 LAB — CBC
HCT: 24.5 % — ABNORMAL LOW (ref 36.0–46.0)
Hemoglobin: 7.8 g/dL — ABNORMAL LOW (ref 12.0–15.0)
MCH: 27.1 pg (ref 26.0–34.0)
MCHC: 31.8 g/dL (ref 30.0–36.0)
MCV: 85.1 fL (ref 80.0–100.0)
Platelets: 296 10*3/uL (ref 150–400)
RBC: 2.88 MIL/uL — ABNORMAL LOW (ref 3.87–5.11)
RDW: 19.3 % — ABNORMAL HIGH (ref 11.5–15.5)
WBC: 6.6 10*3/uL (ref 4.0–10.5)
nRBC: 0 % (ref 0.0–0.2)

## 2021-07-18 LAB — URINALYSIS, COMPLETE (UACMP) WITH MICROSCOPIC
Bilirubin Urine: NEGATIVE
Glucose, UA: NEGATIVE mg/dL
Hgb urine dipstick: NEGATIVE
Ketones, ur: NEGATIVE mg/dL
Nitrite: NEGATIVE
Protein, ur: NEGATIVE mg/dL
Specific Gravity, Urine: 1.009 (ref 1.005–1.030)
pH: 5 (ref 5.0–8.0)

## 2021-07-18 LAB — CREATININE, URINE, RANDOM: Creatinine, Urine: 73.32 mg/dL

## 2021-07-18 LAB — SODIUM, URINE, RANDOM: Sodium, Ur: <10 mmol/L

## 2021-07-18 LAB — MAGNESIUM: Magnesium: 1.9 mg/dL (ref 1.7–2.4)

## 2021-07-18 MED ORDER — FUROSEMIDE 10 MG/ML IJ SOLN
60.0000 mg | Freq: Once | INTRAMUSCULAR | Status: AC
  Administered 2021-07-18: 60 mg via INTRAVENOUS
  Filled 2021-07-18: qty 6

## 2021-07-18 MED ORDER — RIOCIGUAT 2.5 MG PO TABS
2.5000 mg | ORAL_TABLET | Freq: Three times a day (TID) | ORAL | Status: DC
  Administered 2021-07-18 – 2021-07-24 (×16): 2.5 mg via ORAL

## 2021-07-18 MED ORDER — SODIUM CHLORIDE 0.9% FLUSH
3.0000 mL | Freq: Two times a day (BID) | INTRAVENOUS | Status: DC
  Administered 2021-07-20 – 2021-07-24 (×4): 3 mL via INTRAVENOUS

## 2021-07-18 MED ORDER — METOPROLOL SUCCINATE ER 25 MG PO TB24
25.0000 mg | ORAL_TABLET | Freq: Two times a day (BID) | ORAL | Status: DC
  Administered 2021-07-18 – 2021-07-24 (×13): 25 mg via ORAL
  Filled 2021-07-18 (×13): qty 1

## 2021-07-18 NOTE — Progress Notes (Signed)
Pt has taken PM dose of Selexipag TABS and Riociguat TABS. Pt states that Dr. Aundra Dubin has instructed her to keep them at the bedside and not to send down to pharmacy. Pt states that she is willing to sign a waiver so that she can keep them with her at the bedside. Pt educated on our policies.

## 2021-07-18 NOTE — Progress Notes (Addendum)
Pharmacy Consult for Pulmonary Hypertension Treatment   Indication - Continuation of prior to admission medication   Patient is 80 y.o.  with history of PAH on chronic riociguat PTA and will be continued while hospitalized.   Continuing this medication order as an inpatient requires that monitoring parameters per REMS requirements must be met.  Chronic therapy is under the supervision of Dr. Aundra Dubin who is enrolled in the REMS program and is being notified of continuation of therapy. A staff message in EPIC has been sent notifying the certified prescriber.  Per patient report has previously been educated on Pregnancy Risk and Hepatotoxicity . On admission pregnancy risk has been assessed and no monitoring required. Hepatic function has been evaluated. AST/ALT appropriate to continue medication at this time.  Hepatic Function Latest Ref Rng & Units 07/16/2021 01/26/2021 01/22/2021  Total Protein 6.5 - 8.1 g/dL 6.4(L) 7.1 7.3  Albumin 3.5 - 5.0 g/dL 3.4(L) 4.1 4.1  AST 15 - 41 U/L 12(L) 17 16  ALT 0 - 44 U/L '9 9 7  ' Alk Phosphatase 38 - 126 U/L 54 46 46  Total Bilirubin 0.3 - 1.2 mg/dL 0.6 0.4 0.4   Discussed our home medication policy extensively with patient and daughter at bedside. Due to missed/delayed doses during past hospitalizations, patient will keep riociguat at bedside to ensure timely administration. Nursing aware.  Thank for you allowing [place] to participate in the care of this patient.   Laurey Arrow, PharmD PGY1 Pharmacy Resident 07/18/2021  1:10 PM  Please check [email or web address] for unit-specific pharmacy phone numbers.

## 2021-07-18 NOTE — Progress Notes (Signed)
Patient infusing NS@10ml /hr. Ordered rate 32ml/hr. Per RN report, rate was changed to 69ml due to increasing SOB. MD on dayshift was paged and aware.

## 2021-07-18 NOTE — Progress Notes (Addendum)
[facility] PROGRESS NOTE    Progress Note  Alisha [name]  JOI:[id number] DOB: [birth year] DOA: 07/16/2021 PCP: Mackie Pai, PA-C     Brief Narrative:   Alisha [name] is an 80 y.o. 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  She relates that she had her diuretic recently increased as she was becoming short of breath and requiring oxygen.  Upon arrival to the ED she was satting in the low 90s blood pressure 117/58 bicarb of 13 creatinine of 4 with a BUN of 97 (with a baseline creatinine of 1.3-1.5)  Significant studies: RHC in 4/20 showed normal filling pressures but severe pulmonary hypertension with PVR 6 WU.  Echo showed moderately dilated RV with normal systolic function, mild-moderate MS, mild AS.  TEE was done in 4/22 to more closely assess the mitral and aortic valves, EF 60-65%, d-shaped septum, mild RV enlargement with mildly decreased RV systolic function, peak RV-RA gradient 38 mmHg, moderate AS with AVA 1.31 cm^2 mean gradient 26 mmHg, mild-moderate MR with mild-moderate MS mean gradient 7 with MVA 1.7 cm^2 by VTI .   Assessment/Plan:   Acute renal failure superimposed on stage 4 chronic kidney disease/anion gap metabolic acidosis: Likely prerenal history  in the setting of torsemide and metolazone use. This was held she was given gentle IV fluid hydration. She was started on bicarbonate tablets, basic metabolic panels pending this morning. Diuretics held she was started on IV fluids her creatinine is slowly improving.   Pulmonary hypertension probable  groupwith RV failure/chronic heart failure: Continue to hold her diuretics,held since 07/09/2021 by  her nephrologist,. Basic metabolic panels pending. Now in pulmonary edema consult cards.  Normocytic anemia in  the setting of chronic GI blood loss: Attributed to AVMs had received 1 packed red blood cells on 07/15/2021. Mild drop in Hbg 7.8.   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  Prolonged QTC: Monitor on telemetry mag level pending.    DVT prophylaxis: lovenox Family Communication:none Status is: Inpatient  Remains inpatient appropriate because: Acute kidney injury.    Code Status:     Code Status Orders  (From admission, onward)           Start     Ordered   07/17/21 0622  Full code  Continuous        07/17/21 0622           Code Status History     Date Active Date Inactive Code Status Order ID Comments User Context   01/01/2019 1525 01/01/2019 2032 Full Code [id number]  Larey Dresser, MD Inpatient   09/27/2017 1635 09/27/2017 2217 Full Code [id number]  Sherren Mocha, MD Inpatient         IV Access:   Peripheral IV   Procedures and diagnostic studies:   DG Chest 2 View  Result Date: 07/16/2021 CLINICAL DATA:  Shortness of breath and decreasing urinary function EXAM: CHEST - 2 VIEW COMPARISON:  01/26/2021, CT from 02/15/2021 FINDINGS: Cardiac shadow is mildly prominent. Aortic calcifications are noted. The lungs are well aerated bilaterally. Minimal pleural effusions are  seen bilaterally. No acute bony abnormality is seen. Known bilateral pulmonary nodules are not well appreciated on this exam. IMPRESSION: Small pleural effusions bilaterally. Known pulmonary nodules are not well appreciated. Electronically Signed   By: Inez Catalina M.D.   On: 07/16/2021 20:38   CT Renal Stone Study  Result Date: 07/16/2021 CLINICAL DATA:  Acute renal failure. EXAM: CT ABDOMEN AND PELVIS WITHOUT CONTRAST  TECHNIQUE: Multidetector CT imaging of the abdomen and pelvis was performed following the standard protocol without IV contrast. COMPARISON:  None. FINDINGS: Lower chest: Mild atelectasis is seen within the bilateral lung bases. Small bilateral pleural effusions are noted, right greater than left. Hepatobiliary: No focal liver abnormality is seen. Subcentimeter gallstones are seen within the lumen of an otherwise normal-appearing gallbladder. There is no evidence of biliary dilatation. Pancreas: Unremarkable. No pancreatic ductal dilatation or surrounding inflammatory changes. Spleen: Normal in size without focal abnormality. Adrenals/Urinary Tract: Adrenal glands are unremarkable. Kidneys are normal in size, without renal calculi or hydronephrosis. 7 mm and 3 mm foci of fat density are noted within the mid right kidney. A small amount of air is seen within the lumen of the urinary bladder. A very mild amount of surrounding inflammatory fat stranding is seen without evidence of urinary bladder wall thickening Stomach/Bowel: There is a small hiatal hernia. The appendix is surgically absent. No evidence of bowel wall thickening, distention, or inflammatory changes. Noninflamed diverticula are seen throughout the large bowel. Vascular/Lymphatic: Aortic atherosclerosis. No enlarged abdominal or pelvic lymph nodes. Reproductive: Uterus and bilateral adnexa are unremarkable. Other: No abdominal wall hernia or abnormality. No abdominopelvic ascites. Musculoskeletal: Multilevel degenerative changes seen throughout the lumbar spine, most prominent at the levels of L4-L5 and L5-S1. IMPRESSION: 1. Findings involving the urinary bladder which may represent sequelae associated with cystitis. Correlation with urinalysis is recommended. 2. Small bilateral pleural effusions, right greater than left. 3. Cholelithiasis. 4. Colonic diverticulosis. 5. Small hiatal hernia. 6. Aortic atherosclerosis. Aortic Atherosclerosis (ICD10-I70.0).  Electronically Signed   By: Virgina Norfolk M.D.   On: 07/16/2021 20:58     Medical Consultants:   None.   Subjective:    Alisha [name] she relates her breathing is okay while she sitting but when ambulating she gets short of breath.  Objective:    Vitals:   07/17/21 1939 07/18/21 0052 07/18/21 0352 07/18/21 0800  BP: 116/66 102/60 133/65 135/69  Pulse: 83 87 94 88  Resp: 19 19 19 19   Temp: 98.2 F (36.8 C) 98.4 F (36.9 C) 98.5 F (36.9 C) 97.9 F (36.6 C)  TempSrc: Oral Oral Oral   SpO2: 95% 96% 96% 95%  Weight:   70.5 kg   Height:       SpO2: 95 % O2 Flow Rate (L/min): 4 L/min   Intake/Output Summary (Last 24 hours) at 07/18/2021 0935 Last data filed at 07/18/2021 0800 Gross per 24 hour  Intake 1755.33 ml  Output 500 ml  Net 1255.33 ml   Filed Weights   07/17/21 0843 07/18/21 0352  Weight: 70.3 kg 70.5 kg    Exam: General exam: In no acute distress. Respiratory system: Good air movement and clear to auscultation. Cardiovascular system: S1 & S2 heard, RRR. + JVD. Gastrointestinal system: Abdomen is nondistended, soft and nontender.  Extremities: No pedal edema. Skin: No rashes, lesions or ulcers  Data Reviewed:    Labs: Basic Metabolic Panel: Recent Labs  Lab 07/12/21 1135 07/16/21 1108 07/16/21 2004 07/17/21 0700 07/17/21 1114  NA 139 135 134* 136  136  K 3.8 3.6 3.8 3.6 3.6  CL 108 107 106 108 108  CO2 17* 14* 13* 16* 15*  GLUCOSE 197* 197* 146* 145* 225*  BUN 105* 99* 97* 95* 93*  CREATININE 3.56* 4.37* 4.30* 4.01* 4.05*  CALCIUM 9.0 8.6* 8.4* 8.0* 8.0*    GFR Estimated Creatinine Clearance: 11.3 mL/min (A) (by C-G formula based on SCr of 4.05 mg/dL (H)). Liver Function Tests: Recent Labs  Lab 07/16/21 2004  AST 12*  ALT 9  ALKPHOS 54  BILITOT 0.6  PROT 6.4*  ALBUMIN 3.4*    No results for input(s): LIPASE, AMYLASE in the last 168 hours. No results for input(s): AMMONIA in the last 168 hours. Coagulation profile No  results for input(s): INR, PROTIME in the last 168 hours. COVID-19 Labs  No results for input(s): DDIMER, FERRITIN, LDH, CRP in the last 72 hours.  Lab Results  Component Value Date   SARSCOV2NAA NEGATIVE 07/17/2021   [place] NEGATIVE 01/05/2021    CBC: Recent Labs  Lab 07/16/21 2004 07/18/21 0727  WBC 8.6 6.6  NEUTROABS 7.2  --   HGB 8.8* 7.8*  HCT 28.0* 24.5*  MCV 86.2 85.1  PLT 339 296    Cardiac Enzymes: No results for input(s): CKTOTAL, CKMB, CKMBINDEX, TROPONINI in the last 168 hours. BNP (last 3 results) Recent Labs    01/26/21 1313  PROBNP 823.0*    CBG: No results for input(s): GLUCAP in the last 168 hours. D-Dimer: No results for input(s): DDIMER in the last 72 hours. Hgb A1c: No results for input(s): HGBA1C in the last 72 hours. Lipid Profile: No results for input(s): CHOL, HDL, LDLCALC, TRIG, CHOLHDL, LDLDIRECT in the last 72 hours. Thyroid function studies: No results for input(s): TSH, T4TOTAL, T3FREE, THYROIDAB in the last 72 hours.  Invalid input(s): FREET3 Anemia work up: No results for input(s): VITAMINB12, FOLATE, FERRITIN, TIBC, IRON, RETICCTPCT in the last 72 hours. Sepsis Labs: Recent Labs  Lab 07/16/21 2004 07/18/21 0727  WBC 8.6 6.6    Microbiology Recent Results (from the past 240 hour(s))  Resp Panel by RT-PCR (Flu A&B, Covid) Nasopharyngeal Swab     Status: None   Collection Time: 07/17/21  3:05 AM   Specimen: Nasopharyngeal Swab; Nasopharyngeal(NP) swabs in vial transport medium  Result Value Ref Range Status   SARS Coronavirus 2 by RT PCR NEGATIVE NEGATIVE Final    Comment: (NOTE) SARS-CoV-2 target nucleic acids are NOT DETECTED.  The SARS-CoV-2 RNA is generally detectable in upper respiratory specimens during the acute phase of infection. The lowest concentration of SARS-CoV-2 viral copies this assay can detect is 138 copies/mL. A negative result does not preclude SARS-Cov-2 infection and should not be used as the  sole basis for treatment or other patient management decisions. A negative result may occur with  improper specimen collection/handling, submission of specimen other than nasopharyngeal swab, presence of viral mutation(s) within the areas targeted by this assay, and inadequate number of viral copies(<138 copies/mL). A negative result must be combined with clinical observations, patient history, and epidemiological information. The expected result is Negative.  Fact Sheet for Patients:  EntrepreneurPulse.com.au  Fact Sheet for Healthcare Providers:  IncredibleEmployment.be  This test is no t yet approved or cleared by the [place] FDA and  has been authorized for detection and/or diagnosis of SARS-CoV-2 by FDA under an Emergency Use Authorization (EUA). This EUA will remain  in effect (meaning this test can be used) for the duration of the COVID-19 declaration under Section  564(b)(1) of the Act, 21 U.S.C.section 360bbb-3(b)(1), unless the authorization is terminated  or revoked sooner.       Influenza A by PCR NEGATIVE NEGATIVE Final   Influenza B by PCR NEGATIVE NEGATIVE Final    Comment: (NOTE) The Xpert Xpress SARS-CoV-2/FLU/RSV plus assay is intended as an aid in the diagnosis of influenza from Nasopharyngeal swab specimens and should not be used as a sole basis for treatment. Nasal washings and aspirates are unacceptable for Xpert Xpress SARS-CoV-2/FLU/RSV testing.  Fact Sheet for Patients: EntrepreneurPulse.com.au  Fact Sheet for Healthcare Providers: IncredibleEmployment.be  This test is not yet approved or cleared by the [place] FDA and has been authorized for detection and/or diagnosis of SARS-CoV-2 by FDA under an Emergency Use Authorization (EUA). This EUA will remain in effect (meaning this test can be used) for the duration of the COVID-19 declaration under Section 564(b)(1) of the  Act, 21 U.S.C. section 360bbb-3(b)(1), unless the authorization is terminated or revoked.  Performed at [facility], [place] [address] [place], [place] [address]      Medications:    atorvastatin  20 mg Oral Daily   levothyroxine  125 mcg Oral QAC breakfast   pantoprazole  40 mg Oral BID   Selexipag  1,600 mcg Oral BID   Selexipag  800 mcg Oral BID   sodium bicarbonate  650 mg Oral TID   sodium chloride flush  3 mL Intravenous Q12H   Continuous Infusions:    LOS: 1 day   Charlynne Cousins  [facility]  07/18/2021, 9:35 AM

## 2021-07-18 NOTE — Consult Note (Addendum)
Cardiology Consultation:   Patient ID: Alisha [name] MRN: [id number]; DOB: 12/18/1940  Admit date: 07/16/2021 Date of Consult: 07/18/2021  PCP:  Mackie Pai, PA-C   [facility] Providers Cardiologist:  None  Advanced Heart Failure:  Loralie Champagne, MD   Patient Profile:   Alisha [name] is a 80 y.o. female with a hx of diabetes, gastroparesis, DVT, hypertension, PAD, hyperlipidemia, CKD, AS/MR, and severe pulmonary hypertension who is being seen 07/18/2021 for the evaluation of pulmonary hypertension and edema at the request of Dr. Myna Hidalgo.  History of Present Illness:   Ms. Deberry has followed in the advanced heart failure clinic for pulmonary hypertension.  She was initially seen for worsening exertional dyspnea greater than 1 year duration. Echo in April 2018 showed preserved LVEF of 65% with moderate pulmonary hypertension.  She suffered syncope in May and October 2018.  An event monitor in May 2018 with no significant arrhythmias.  She underwent left and right heart catheterization in January 2019 that showed normal PCWP and severely elevated PA pressure, no significant CAD.  She was treated with home oxygen for use with exertion and started on Opsumit.  Unfortunately she did not tolerate Opsumit due to increased peripheral edema.  She also did not tolerate ambrisentan due to worsening dyspnea and peripheral edema with low oxygen saturations.  She has a history of iron deficiency anemia with GI work-up; noted to have a slow chronic GI bleed.  She has tolerated riociguat 1.5 mg TID and uptravi 2400 mcg BID.  RHC and April 2020 showed normal filling pressures but severe pulmonary hypertension.  Echocardiogram showed moderately dilated RV with normal systolic function, mild to moderate MS, mild AS.  She underwent TEE on 01/07/2021 that showed moderate AAS with VTI 1.31 cm, and a mean gradient of 26 mmHg.  Mitral valve with mild to moderate MR and mild to moderate mitral stenosis with mean  gradient 7 with MVA 1.7 cm by VTI.  She uses CPAP for OSA.  She was last seen by Dr. Aundra Dubin on 04/27/2021 for follow-up of pulmonary hypertension with RV failure.  He noted the etiology of her pulmonary hypertension is uncertain.  She has a history of DVT with negative VQ scan in February 2019.  She is compliant with CPAP.  Rheumatologic evaluation has been unrevealing.  Pulmonary hypertension felt out of proportion for her valvular disease.  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  In clinic, she was volume up and her lasix was switched to torsemide 60 mg BID with continuation of metolazone once weekly (Saturday). Amlodipine was increased to 10 mg. She was seen back in clinic on 05/26/21 and reportedly was taking torsemide 20 mg TID and had not been wearing oxygen consistently and reported breathing was worse. She was encouraged to take the correct dose of torsemide and use O2. On follow up 06/18/21, her edema and dyspnea had improved and she self-weaned off of O2. However. sCr was rising. Nephrology was reducing her torsemide and on 07/09/21 asked her to take a 2 day diuretic holiday. The following wee, she only took torsemide on Monday. She attributed her worsening shortness of breath to anemia and needing a blood transfusion, which she received on 07/15/21. Unfortunately, she did not have the expected improvement of DOE. She had reduced urination on 07/16/21 and panic attack due to not being able to breathe. Labs were checked and showed rising creatinine and she was instructed to proceed to the ER.   She presented to [facility] ED on 07/17/2021.  After holding diuretics for about 1 week, she had leg swelling and struggled with worsening DOE. 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 Last transfusion was 07/15/21; DOE did not improve. Bladder scan was negative for urinary retention.  Of note, she was diagnosed with C diff on 06/16/21 and completed oral  vanc. She also underwent endoscopy with GI on 06/24/21 for melenic stools x 6-7 days since early Sept 2022. No AVMs noted on endoscopy.  sCr: 06/21/21 - 1.40 07/05/21 - 2.44 07/16/21 - 4.37  Cardiology was consulted for pulmonary hypertension and diuretic regimen.   BNP 1248 in the setting of renal failure CXR with small pleural effusions and known pulmonary nodules  She has not receive diuretic this admission. During my interview, she describes a panic attack prior to admission due to feeling very dyspneic. She is still very dyspneic with minimal exertion (coming back from the bathroom)   Past Medical History:  Diagnosis Date   Aortic atherosclerosis ([place]) 08/30/2017   Aortic stenosis 08/30/2017   CAD (coronary artery disease), native coronary artery 08/30/2017   CHF (congestive heart failure) (HCC)    Chronic diastolic heart failure ([place]) 08/30/2017   Diabetes mellitus with peripheral vascular disease ([place]) 07/14/2014   Hypertensive heart disease without CHF 08/30/2017   Hypothyroidism (acquired) 08/30/2017   Pulmonary hypertension ([place]) 07/04/2017    Past Surgical History:  Procedure Laterality Date   ANKLE SURGERY     APPENDECTOMY     DILATION AND CURETTAGE OF UTERUS     RIGHT HEART CATH N/A 01/01/2019   Procedure: RIGHT HEART CATH;  Surgeon: Larey Dresser, MD;  Location: [place] [facility];  Service: Cardiovascular;  Laterality: N/A;   RIGHT/LEFT HEART CATH AND CORONARY ANGIOGRAPHY N/A 09/27/2017   Procedure: RIGHT/LEFT HEART CATH AND CORONARY ANGIOGRAPHY;  Surgeon: Sherren Mocha, MD;  Location: [place] [facility];  Service: Cardiovascular;  Laterality: N/A;   TEE WITHOUT CARDIOVERSION N/A 01/07/2021   Procedure: TRANSESOPHAGEAL ECHOCARDIOGRAM (TEE);  Surgeon: Larey Dresser, MD;  [facility];  Service: Cardiovascular;  Laterality: N/A;     Home Medications:  Prior to Admission medications   Medication Sig Start Date End Date Taking? Authorizing Provider   ADEMPAS 2.5 MG TABS TAKE 1 TABLET THREE TIMES A DAY Patient taking differently: Take 2.5 mg by mouth 3 (three) times daily. 05/24/21  Yes Larey Dresser, MD  amLODipine (NORVASC) 10 MG tablet Take 1 tablet (10 mg total) by mouth daily. 04/27/21  Yes Larey Dresser, MD  atorvastatin (LIPITOR) 20 MG tablet TAKE 1 TABLET BY MOUTH IN  THE MORNING Patient taking differently: Take 20 mg by mouth daily. 01/12/21  Yes Saguier, Percell Miller, PA-C  cetirizine (ZYRTEC) 10 MG tablet Take 10 mg by mouth daily as needed for allergies.   Yes [provider]  cholecalciferol (VITAMIN D) 25 MCG (1000 UNIT) tablet Take 1,000 Units by mouth daily.   Yes [provider]  fenofibrate 160 MG tablet Take 1 tablet (160 mg total) by mouth every morning. 11/17/20  Yes Saguier, Percell Miller, PA-C  glucose blood (ACCU-CHEK AVIVA PLUS) test strip Check blood sugar TID dx: E11.9 04/07/21  Yes Saguier, Percell Miller, PA-C  levothyroxine (SYNTHROID) 125 MCG tablet Take 1 tablet (125 mcg total) by mouth daily before breakfast. 11/17/20 05/26/22 Yes Saguier, Percell Miller, PA-C  loperamide (IMODIUM A-D) 2 MG tablet Take 2 mg by mouth daily as needed for diarrhea or loose stools.  Yes [provider]  loratadine (CLARITIN) 10 MG tablet Take 10 mg by mouth daily as needed for allergies.    Yes [provider]  metFORMIN (GLUCOPHAGE) 500 MG tablet Take 1 tablet (500 mg total) by mouth 2 (two) times daily with a meal. Patient taking differently: Take 500 mg by mouth See admin instructions. 500 mg every morning 500 mg in the afternoon if  blood sugar reading is 180 or higher. 11/17/20  Yes Saguier, Percell Miller, PA-C  metolazone (ZAROXOLYN) 2.5 MG tablet Take 1 tablet (2.5 mg total) by mouth once a week. Take potassium 34mq with this Patient taking differently: Take 2.5 mg by mouth every Saturday at 6 PM. 06/08/20  Yes MLarey Dresser MD  metoprolol succinate (TOPROL XL) 25 MG 24 hr tablet Take 1 tablet (25 mg total) by mouth 2 (two)  times daily. 11/17/20 11/17/21 Yes Saguier, EPercell Miller PA-C  Multiple Vitamins-Minerals (PRESERVISION AREDS 2 PO) Take 1 capsule by mouth 2 (two) times daily.   Yes [provider]  pantoprazole (PROTONIX) 40 MG tablet TAKE 1 TABLET BY MOUTH  TWICE DAILY Patient taking differently: Take 40 mg by mouth 2 (two) times daily. 04/07/21  Yes Saguier, EPercell Miller PA-C  torsemide (DEMADEX) 20 MG tablet Take 2 tablets (40 mg total) by mouth 2 (two) times daily. Patient taking differently: Take 20 mg by mouth 2 (two) times daily. 07/05/21  Yes FJoette Catching PA-C  UPTRAVI 1600 MCG TABS Take 1 tablet (1,600 mcg total) by mouth 2 (two) times daily. Take with 800 for a total of 2400 mg 01/22/21  Yes MLarey Dresser MD  UPTRAVI 800 MCG TABS Take 1 tablet (800 mcg total) by mouth 2 (two) times daily. Take with 1600 mcg for a total of 2400 mg 01/22/21  Yes MLarey Dresser MD  allopurinol (ZYLOPRIM) 100 MG tablet Take 1 tablet (100 mg total) by mouth daily. Patient not taking: No sig reported 05/26/21   RCollier Salina MD  ferrous sulfate 325 (65 FE) MG tablet Take 1 tablet (325 mg total) by mouth daily with breakfast. Morning and night 11/17/20 05/26/22  Saguier, EPercell Miller PA-C  potassium chloride (KLOR-CON) 10 MEQ tablet Take 3 tablets (30 mEq total) by mouth daily. Take an extra 269m with metolazone Patient taking differently: Take 30 mEq by mouth See admin instructions. 30 meq once daily Take an extra 2036mwith metolazone 07/09/21   McLLarey DresserD  traMADol (ULTRAM) 50 MG tablet Take 1 tablet (50 mg total) by mouth every 6 (six) hours as needed for moderate pain or severe pain. Patient not taking: No sig reported 05/10/21   SagMackie PaiA-C    Inpatient Medications: Scheduled Meds:  atorvastatin  20 mg Oral Daily   levothyroxine  125 mcg Oral QAC breakfast   metoprolol succinate  25 mg Oral BID   pantoprazole  40 mg Oral BID   Selexipag  1,600 mcg Oral BID   Selexipag  800 mcg Oral BID    sodium bicarbonate  650 mg Oral TID   sodium chloride flush  3 mL Intravenous Q12H   Continuous Infusions:  PRN Meds: acetaminophen **OR** acetaminophen  Allergies:    Allergies  Allergen Reactions   Other Palpitations    Surgical metal (stents, pins, plates) keeps pt from healing   Adhesive [Tape] Other (See Comments)    Irritates skin with rash   Nickel Rash    Social History:   Social History   Socioeconomic History  Marital status: Divorced    Spouse name: Not on file   Number of children: Not on file   Years of education: Not on file   Highest education level: Not on file  Occupational History   Not on file  Tobacco Use   Smoking status: Never   Smokeless tobacco: Never  Vaping Use   Vaping Use: Never used  Substance and Sexual Activity   Alcohol use: Yes    Comment: Occasionally   Drug use: No   Sexual activity: Never  Other Topics Concern   Not on file  Social History Narrative   Not on file   Social Determinants of Health   Financial Resource Strain: Not on file  Food Insecurity: Not on file  Transportation Needs: Not on file  Physical Activity: Not on file  Stress: Not on file  Social Connections: Not on file  Intimate Partner Violence: Not on file    Family History:    Family History  Problem Relation Age of Onset   Diabetes Mother    Bleeding Disorder Mother    Heart disease Father    Alzheimer's disease Father    Diabetes Sister    Diabetes Brother    Heart disease Brother    Healthy Daughter    Healthy Son      ROS:  Please see the history of present illness.   All other ROS reviewed and negative.     Physical Exam/Data:   Vitals:   07/17/21 1939 07/18/21 0052 07/18/21 0352 07/18/21 0800  BP: 116/66 102/60 133/65 135/69  Pulse: 83 87 94 88  Resp: _0 Temp: 98.2 F (36.8 C) 98.4 F (36.9 C) 98.5 F (36.9 C) 97.9 F (36.6 C)  TempSrc: Oral Oral Oral   SpO2: 95% 96% 96% 95%  Weight:   70.5 kg   Height:         Intake/Output Summary (Last 24 hours) at 07/18/2021 1026 Last data filed at 07/18/2021 0800 Gross per 24 hour  Intake 1755.33 ml  Output 500 ml  Net 1255.33 ml   Last 3 Weights 07/18/2021 07/17/2021 06/18/2021  Weight (lbs) 155 lb 8 oz 154 lb 14.4 oz 155 lb  Weight (kg) 70.534 kg 70.262 kg 70.308 kg     Body mass index is 25.88 kg/m.  General:  Well nourished, well developed, in no acute distress HEENT: normal Neck: no JVD Vascular: No carotid bruits; Distal pulses 2+ bilaterally Cardiac:  normal S1; RRR; 4/6 systolic murmur Lungs:  coarse sounds throughout Abd: soft, nontender, no hepatomegaly  Ext: no edema Musculoskeletal:  No deformities, BUE and BLE strength normal and equal Skin: warm and dry  Neuro:  CNs 2-12 intact, no focal abnormalities noted Psych:  Normal affect   EKG:  The EKG was personally reviewed and demonstrates:  sinus rhythm with HR 79, left posterior fascicular block Telemetry:  Telemetry was personally reviewed and demonstrates:  sinus rhythm in the 80-90s, narrow complex tachycardia with HR up to the 140s with minimal movement (bathroom)  Relevant CV Studies:  TEE 01/07/21: 1. Left ventricular ejection fraction, by estimation, is 60 to 65%. The  left ventricle has normal function. The left ventricle has no regional  wall motion abnormalities. There is mild left ventricular hypertrophy.   2. Mildly D-shaped interventricular septum suggestive of a degree of RV  pressure/volume overload. Right ventricular systolic function is normal.  The right ventricular size is mildly enlarged. Peak RV-RA gradient 38  mmHg.  3. Left atrial size was moderately dilated. No left atrial/left atrial  appendage thrombus was detected.   4. No PFO or ASD by color doppler.   5. The aortic valve is tricuspid. Aortic valve regurgitation is mild.  Moderate aortic valve stenosis. Aortic valve area, by VTI measures 1.31  cm. Aortic valve mean gradient measures 26.0 mmHg.   6.  The mitral valve is degenerative. Mild to moderate mitral valve  regurgitation. Mild to moderate mitral stenosis, mean gradient 7 mmHg with  MVA 1.7 cm^2 by VTI (2.5 cm^2 by PHT). Moderate mitral annular and  valvular calcification with restriction of  the leaflets.   7. Normal caliber thoracic aorta with grade 3 plaque in descending  thoracic aorta.   Laboratory Data:  High Sensitivity Troponin:   Recent Labs  Lab 07/17/21 0336 07/17/21 0601  TROPONINIHS 17 18*     Chemistry Recent Labs  Lab 07/16/21 2004 07/17/21 0700 07/17/21 1114  NA 134* 136 136  K 3.8 3.6 3.6  CL 106 108 108  CO2 13* 16* 15*  GLUCOSE 146* 145* 225*  BUN 97* 95* 93*  CREATININE 4.30* 4.01* 4.05*  CALCIUM 8.4* 8.0* 8.0*  GFRNONAA 10* 11* 11*  ANIONGAP _0 Recent Labs  Lab 07/16/21 2004  PROT 6.4*  ALBUMIN 3.4*  AST 12*  ALT 9  ALKPHOS 54  BILITOT 0.6   Lipids No results for input(s): CHOL, TRIG, HDL, LABVLDL, LDLCALC, CHOLHDL in the last 168 hours.  Hematology Recent Labs  Lab 07/16/21 2004 07/18/21 0727  WBC 8.6 6.6  RBC 3.25* 2.88*  HGB 8.8* 7.8*  HCT 28.0* 24.5*  MCV 86.2 85.1  MCH 27.1 27.1  MCHC 31.4 31.8  RDW 19.3* 19.3*  PLT 339 296   Thyroid No results for input(s): TSH, FREET4 in the last 168 hours.  BNP Recent Labs  Lab 07/17/21 0336  BNP 1,247.8*    DDimer No results for input(s): DDIMER in the last 168 hours.   Radiology/Studies:  DG Chest 2 View  Result Date: 07/16/2021 CLINICAL DATA:  Shortness of breath and decreasing urinary function EXAM: CHEST - 2 VIEW COMPARISON:  01/26/2021, CT from 02/15/2021 FINDINGS: Cardiac shadow is mildly prominent. Aortic calcifications are noted. The lungs are well aerated bilaterally. Minimal pleural effusions are seen bilaterally. No acute bony abnormality is seen. Known bilateral pulmonary nodules are not well appreciated on this exam. IMPRESSION: Small pleural effusions bilaterally. Known pulmonary nodules are not  well appreciated. Electronically Signed   By: Inez Catalina M.D.   On: 07/16/2021 20:38   CT Renal Stone Study  Result Date: 07/16/2021 CLINICAL DATA:  Acute renal failure. EXAM: CT ABDOMEN AND PELVIS WITHOUT CONTRAST TECHNIQUE: Multidetector CT imaging of the abdomen and pelvis was performed following the standard protocol without IV contrast. COMPARISON:  None. FINDINGS: Lower chest: Mild atelectasis is seen within the bilateral lung bases. Small bilateral pleural effusions are noted, right greater than left. Hepatobiliary: No focal liver abnormality is seen. Subcentimeter gallstones are seen within the lumen of an otherwise normal-appearing gallbladder. There is no evidence of biliary dilatation. Pancreas: Unremarkable. No pancreatic ductal dilatation or surrounding inflammatory changes. Spleen: Normal in size without focal abnormality. Adrenals/Urinary Tract: Adrenal glands are unremarkable. Kidneys are normal in size, without renal calculi or hydronephrosis. 7 mm and 3 mm foci of fat density are noted within the mid right kidney. A small amount of air is seen within the lumen of the urinary bladder. A very mild amount  of surrounding inflammatory fat stranding is seen without evidence of urinary bladder wall thickening Stomach/Bowel: There is a small hiatal hernia. The appendix is surgically absent. No evidence of bowel wall thickening, distention, or inflammatory changes. Noninflamed diverticula are seen throughout the large bowel. Vascular/Lymphatic: Aortic atherosclerosis. No enlarged abdominal or pelvic lymph nodes. Reproductive: Uterus and bilateral adnexa are unremarkable. Other: No abdominal wall hernia or abnormality. No abdominopelvic ascites. Musculoskeletal: Multilevel degenerative changes seen throughout the lumbar spine, most prominent at the levels of L4-L5 and L5-S1. IMPRESSION: 1. Findings involving the urinary bladder which may represent sequelae associated with cystitis. Correlation with  urinalysis is recommended. 2. Small bilateral pleural effusions, right greater than left. 3. Cholelithiasis. 4. Colonic diverticulosis. 5. Small hiatal hernia. 6. Aortic atherosclerosis. Aortic Atherosclerosis (ICD10-I70.0). Electronically Signed   By: Virgina Norfolk M.D.   On: 07/16/2021 20:58     Assessment and Plan:   Severe pulmonary hypertension-group 1 RV failure Chronic diastolic heart failure Etiology felt uncertain.  She has had work-up for PE in the past that was negative.  She is compliant with CPAP at home.  She had a positive ANA but additional serologies were negative.  CT chest negative for ILD or emphysema.  She does have valvular heart disease but felt out of proportion to her degree of pulmonary hypertension. She did not tolerate opsumit or ambrisentan due to signficant lower extremity edema and worsening dyspnea.  She is maintained on Adempas and Uptravi as well as home O2 for exertion. - exacerbation may have been due to 1 week off of diuretic vs superimposed infection (C diff) and ABX use with AoCKD - continue to monitor renal function - slowly reinstate diuretic, but hold off today - BNP elevated in the setting or AoCKD, CXR without significant volume overload  - would treat anemia first   Acute on CKD stage IV - sCr 4.37 on 07/16/21 --> now downtrending - baseline creatinine 1.6-1.9 - unclear if this was precipitated by an increase in diuretic vs dehydration with ongoing diarrhea vs oral vanc - consider nephrology consult   Narrow complex tachycardia - HR in the 140s with bathroom trips - on BB, limited by pulmonary status - continue to monitor - will likely improve as pulmonary status improves   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 Diff - treated with oral vanc    Risk Assessment/Risk Scores:    New York Heart Association (NYHA) Functional Class NYHA Class IV        For questions or updates, please contact [place] Please consult [email or web address] for contact info under    Signed, Ledora Bottcher, [place]  07/18/2021 10:26 AM  Attending note  Patient seen and discussed with PA [facility], I agree with her documentation. 80 yo female history of DM2, HTN, PAD, HL, severe pulmonary HTN, gastritis, Fe deficient anemia, chronic hypoxia, CKD 3, admitted initialyl with worsening renal function. During admission after IVFs has developed some SOB/DOE, signs of fluid overload.    Pulm  HTN macitentan caused LE edema. Ambrisentan caused LE edema, dyspnea. Has been on riociguat and selexipag.   Pulmonary hypertension: Echo (4/18) with EF 65%, mild MR, mild TR, mild mitral stenosis, moderate pulmonary hypertension. - CTA chest 10/17: No PE.  - RHC (1/19): mean RA 11, PA 87/31 mean 51, mean PCWP 6, CI 3.58, PVR 7 WU.  - PFTs (1/19): mild obstruction, some restriction => severely decreased DLCO. - High resolution CT chest (1/19): no interstitial lung disease or emphysema. 7 mm nodule RUL.  - anti-Jo1 negative, RF borderline positive, SCL-70 negative, HIV negative. ANA + => anti-dsDNA negative, SSA/SSB negative, anti-Sm negative, anti-RNP negative. - V/Q scan 2/19: No e/o chronic PE.  - Echo (4/19): EF 60-65%, mild LVH, mild aortic stenosis, mild mitral stenosis, moderately dilated RV with mildly decreased systolic function, PASP 36 mmHg (poor envelope, may be underestimated).  - Does not tolerate ERAs (macitentan, ambrisentan).  - RHC (4/20): mean RA 5, PA 85/22 mean 51, mean PCWP 12, CI  3.5, PVR 6 WU.  -  Echo (4/20): EF > 65%, moderate LVH, RV moderately dilated with normal systolic function, moderate pericardial effusion, mild-moderate mitral stenosis with mean gradient 7/MVA by PHT 2.48 cm^2, mild AS mean gradient 12.  - Echo (4/21): EF 13-08%, grade 2 diastolic dysfunction, normal RV, PASP 50 mmHg, probably mild mitral stenosis with mean gradient 7 mmHg and MVA 2 cm^2 by PHT, mild AS.  - Echo (4/22): EF 60-65%, moderate LVH, moderate AS with mean gradient 27 mmHg and AVA 1.06 cm^2, moderate mitral stenosis with mean gradient 7 and MVA 1.2 cm^2, normal RV size and systolic function, PASP 41 mmHg.  - TEE (4/22): EF 60-65%, d-shaped septum, mild RV enlargement with mildly decreased RV systolic function, peak RV-RA gradient 38 mmHg, moderate AS with AVA 1.31 cm^2 mean gradient 26 mmHg, mild-moderate MR with mild-moderate MS mean gradient 7 with MVA 1.7 cm^2 by VTI .    Admit data Weight 154 lbs. 06/18/21 155 lbs  BNP 1247 K 3.8 BUN 97 Cr 4.3  Trop 17-->18 CXR small bilateral effusions CT renal stones: no acute renal findings.     Presented with AKI on CKD IV. OVer 2 weeks Cr 2.44-->3.50-->4.3. Baseline 1.6 to 1.9 Diuretics held since 07/09/21. Has received IVFs this admission, some improvement in Cr but still above 4 and now increased SOB with evidence of volume overload by exam and CXR, BNP above baseline. Concern would be possible worsening of RV function leading to low output, f/u repeat echo. Would avoid any additional IVFs. May require repeat RHC this admission after CHF team eavluation   History of severe pulm HTN/Chronic diastolic HF followed by CHF clinic. Exact etiology unclear, complex workup as reported above, suspected group 1 PAH. With evidence of volume overload, sinus tachycardia,  and renal dysfunction concern for potential further decline of RV function. F/u repeat echo. Hold any additional IVFs. Would dose IV lasix 1m x 1. If stable RV findings other scenario could be possible  AKI from other mechanism. - have placed pharmacy consult to see if can get inpatient riociguat, she is on her selexipag  Valvular heart diseae - f/u repeat echo.   Chronic anemia - Received 1 unit prbcs 07/15/21   JCarlyle DollyMD

## 2021-07-18 NOTE — Progress Notes (Signed)
  Echocardiogram 2D Echocardiogram has been performed.  Alisha [name] 07/18/2021, 2:26 PM

## 2021-07-19 ENCOUNTER — Encounter (HOSPITAL_COMMUNITY)
Admission: EM | Disposition: A | Discharge status: Home / Self Care | Payer: Self-pay | Source: Home / Self Care | Attending: Family Medicine

## 2021-07-19 DIAGNOSIS — I5032 Chronic diastolic (congestive) heart failure: Secondary | ICD-10-CM | POA: Diagnosis not present

## 2021-07-19 DIAGNOSIS — K5520 Angiodysplasia of colon without hemorrhage: Secondary | ICD-10-CM | POA: Diagnosis not present

## 2021-07-19 DIAGNOSIS — I5043 Acute on chronic combined systolic (congestive) and diastolic (congestive) heart failure: Secondary | ICD-10-CM | POA: Diagnosis not present

## 2021-07-19 DIAGNOSIS — N179 Acute kidney failure, unspecified: Secondary | ICD-10-CM | POA: Diagnosis not present

## 2021-07-19 DIAGNOSIS — I272 Pulmonary hypertension, unspecified: Secondary | ICD-10-CM | POA: Diagnosis not present

## 2021-07-19 HISTORY — PX: RIGHT HEART CATH: CATH118263

## 2021-07-19 LAB — POCT I-STAT EG7
Acid-base deficit: 7 mmol/L — ABNORMAL HIGH (ref 0.0–2.0)
Acid-base deficit: 8 mmol/L — ABNORMAL HIGH (ref 0.0–2.0)
Bicarbonate: 16.3 mmol/L — ABNORMAL LOW (ref 20.0–28.0)
Bicarbonate: 17 mmol/L — ABNORMAL LOW (ref 20.0–28.0)
Calcium, Ion: 1.15 mmol/L (ref 1.15–1.40)
Calcium, Ion: 1.19 mmol/L (ref 1.15–1.40)
HCT: 22 % — ABNORMAL LOW (ref 36.0–46.0)
HCT: 33 % — ABNORMAL LOW (ref 36.0–46.0)
Hemoglobin: 11.2 g/dL — ABNORMAL LOW (ref 12.0–15.0)
Hemoglobin: 7.5 g/dL — ABNORMAL LOW (ref 12.0–15.0)
O2 Saturation: 58 %
O2 Saturation: 61 %
Potassium: 3.5 mmol/L (ref 3.5–5.1)
Potassium: 3.6 mmol/L (ref 3.5–5.1)
Sodium: 139 mmol/L (ref 135–145)
Sodium: 140 mmol/L (ref 135–145)
TCO2: 17 mmol/L — ABNORMAL LOW (ref 22–32)
TCO2: 18 mmol/L — ABNORMAL LOW (ref 22–32)
pCO2, Ven: 26.6 mmHg — ABNORMAL LOW (ref 44.0–60.0)
pCO2, Ven: 28.1 mmHg — ABNORMAL LOW (ref 44.0–60.0)
pH, Ven: 7.389 (ref 7.250–7.430)
pH, Ven: 7.396 (ref 7.250–7.430)
pO2, Ven: 30 mmHg — CL (ref 32.0–45.0)
pO2, Ven: 31 mmHg — CL (ref 32.0–45.0)

## 2021-07-19 LAB — CBC
HCT: 23.5 % — ABNORMAL LOW (ref 36.0–46.0)
Hemoglobin: 7.4 g/dL — ABNORMAL LOW (ref 12.0–15.0)
MCH: 27 pg (ref 26.0–34.0)
MCHC: 31.5 g/dL (ref 30.0–36.0)
MCV: 85.8 fL (ref 80.0–100.0)
Platelets: 287 10*3/uL (ref 150–400)
RBC: 2.74 MIL/uL — ABNORMAL LOW (ref 3.87–5.11)
RDW: 19.5 % — ABNORMAL HIGH (ref 11.5–15.5)
WBC: 7.7 10*3/uL (ref 4.0–10.5)
nRBC: 0 % (ref 0.0–0.2)

## 2021-07-19 LAB — BASIC METABOLIC PANEL
Anion gap: 9 (ref 5–15)
BUN: 89 mg/dL — ABNORMAL HIGH (ref 8–23)
CO2: 18 mmol/L — ABNORMAL LOW (ref 22–32)
Calcium: 8.2 mg/dL — ABNORMAL LOW (ref 8.9–10.3)
Chloride: 109 mmol/L (ref 98–111)
Creatinine, Ser: 3.43 mg/dL — ABNORMAL HIGH (ref 0.44–1.00)
GFR, Estimated: 13 mL/min — ABNORMAL LOW (ref >60)
Glucose, Bld: 141 mg/dL — ABNORMAL HIGH (ref 70–99)
Potassium: 3.3 mmol/L — ABNORMAL LOW (ref 3.5–5.1)
Sodium: 136 mmol/L (ref 135–145)

## 2021-07-19 LAB — BRAIN NATRIURETIC PEPTIDE: B Natriuretic Peptide: 1758.6 pg/mL — ABNORMAL HIGH (ref 0.0–100.0)

## 2021-07-19 LAB — UREA NITROGEN, URINE: Urea Nitrogen, Ur: 580 mg/dL

## 2021-07-19 SURGERY — RIGHT HEART CATH
Anesthesia: LOCAL

## 2021-07-19 MED ORDER — POTASSIUM CHLORIDE CRYS ER 20 MEQ PO TBCR
40.0000 meq | EXTENDED_RELEASE_TABLET | Freq: Three times a day (TID) | ORAL | Status: AC
  Administered 2021-07-19 (×2): 40 meq via ORAL
  Filled 2021-07-19 (×2): qty 2

## 2021-07-19 MED ORDER — HYDRALAZINE HCL 20 MG/ML IJ SOLN: 10.0000 mg | INTRAMUSCULAR | Status: AC | PRN

## 2021-07-19 MED ORDER — HEPARIN (PORCINE) IN NACL 1000-0.9 UT/500ML-% IV SOLN
INTRAVENOUS | Status: DC | PRN
  Administered 2021-07-19: 500 mL

## 2021-07-19 MED ORDER — LIDOCAINE HCL (PF) 1 % IJ SOLN
INTRAMUSCULAR | Status: AC
  Filled 2021-07-19: qty 30

## 2021-07-19 MED ORDER — HEPARIN (PORCINE) IN NACL 1000-0.9 UT/500ML-% IV SOLN
INTRAVENOUS | Status: AC
  Filled 2021-07-19: qty 1000

## 2021-07-19 MED ORDER — LIDOCAINE HCL (PF) 1 % IJ SOLN
INTRAMUSCULAR | Status: DC | PRN
  Administered 2021-07-19: 2 mL

## 2021-07-19 MED ORDER — SODIUM CHLORIDE 0.9% FLUSH
3.0000 mL | Freq: Two times a day (BID) | INTRAVENOUS | Status: DC
  Administered 2021-07-19 – 2021-07-24 (×10): 3 mL via INTRAVENOUS

## 2021-07-19 MED ORDER — SODIUM CHLORIDE 0.9% FLUSH: 3.0000 mL | INTRAVENOUS | Status: DC | PRN

## 2021-07-19 MED ORDER — SODIUM CHLORIDE 0.9 % IV SOLN: INTRAVENOUS | Status: DC

## 2021-07-19 MED ORDER — LABETALOL HCL 5 MG/ML IV SOLN: 10.0000 mg | INTRAVENOUS | Status: AC | PRN

## 2021-07-19 MED ORDER — ACETAMINOPHEN 325 MG PO TABS: 650.0000 mg | ORAL_TABLET | ORAL | Status: DC | PRN

## 2021-07-19 MED ORDER — ONDANSETRON HCL 4 MG/2ML IJ SOLN: 4.0000 mg | Freq: Four times a day (QID) | INTRAMUSCULAR | Status: DC | PRN

## 2021-07-19 MED ORDER — SODIUM CHLORIDE 0.9 % IV SOLN: 250.0000 mL | INTRAVENOUS | Status: DC | PRN

## 2021-07-19 MED ORDER — FUROSEMIDE 10 MG/ML IJ SOLN
80.0000 mg | Freq: Two times a day (BID) | INTRAMUSCULAR | Status: DC
  Administered 2021-07-19 – 2021-07-22 (×8): 80 mg via INTRAVENOUS
  Filled 2021-07-19 (×9): qty 8

## 2021-07-19 SURGICAL SUPPLY — 6 items
CATH BALLN WEDGE 5F 110CM (CATHETERS) ×2 IMPLANT
KIT HEART LEFT (KITS) ×2 IMPLANT
PACK CARDIAC CATHETERIZATION (CUSTOM PROCEDURE TRAY) ×2 IMPLANT
SHEATH GLIDE SLENDER 4/5FR (SHEATH) ×2 IMPLANT
TRANSDUCER W/STOPCOCK (MISCELLANEOUS) ×2 IMPLANT
WIRE EMERALD 3MM-J .025X260CM (WIRE) IMPLANT

## 2021-07-19 NOTE — H&P (View-Only) (Signed)
Advanced Heart Failure Team Consult Note   Primary Physician: Mackie Pai, PA-C PCP-Cardiologist:  None Hematologist: Dr Roseanne Reno   Reason for Consultation: Heart Failure   HPI:    Alisha [name] is seen today for evaluation of heart failure at the request of Dr Venetia Constable.   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   Extensive PAH work up. VQ scan 10/2017 negative for PE. Most recent RHC  12/2020--> mean RA 5, PA 85/22 mean 51, mean PCWP 12, CI  3.5, PVR 6 WU.   PAH - on dual therapy with UPTRAVI 2400 mcg + adempas 2.5 mg tid. Intolerant ambrisentan and opsumit.   On 06/16/21 she had C DIFF. Treated with oral vancomycin.   Followed closed in the HF clinic. Last seen  06/18/21. She was was continued on torsemide 60 mg twice a day and metolazone weekly.    Over the next few week creatinine worsening. Torsemide was cut back to 40 mg twice a day and metolazone was stopped .   Diuretics have held by Nephrology 10//28/22.    On 07/15/21 given 1UPRBCs . H/O anemia and AVMs. Followed at [facility]   repeat BMET on 07/16/21 and creatinine was up to 4.4. She was instructed to go the ED.   Admitted with AKI and increased shortness of breath. Diuretics held. Given IV fluids. Yesterday Cardiology consulted. CXR suggestive pulmonary edema. Volume overloaded. Given 60 mg IV lasix x1.   Echo this admit - mitral stenosis worse.   Pertinent Labs SARs 2 Flu negative.  Bicarb on admit 13. Creatinine 4.4>4>3.8>3.4 BNP 1247>1758  Hgb 7.4    Cardiac Testing  Echo (4/18) with EF 65%, mild MR, mild TR, mild mitral stenosis, moderate pulmonary hypertension. - CTA chest 10/17: No PE.  - RHC (1/19): mean RA 11, PA 87/31 mean 51, mean PCWP 6, CI 3.58, PVR 7 WU.  - PFTs (1/19): mild obstruction, some restriction => severely decreased DLCO. - High resolution CT  chest (1/19): no interstitial lung disease or emphysema. 7 mm nodule RUL.  - anti-Jo1 negative, RF borderline positive, SCL-70 negative, HIV negative. ANA + => anti-dsDNA negative, SSA/SSB negative, anti-Sm negative, anti-RNP negative. - V/Q scan 2/19: No e/o chronic PE.  - Echo (4/19): EF 60-65%, mild LVH, mild aortic stenosis, mild mitral stenosis, moderately dilated RV with mildly decreased systolic function, PASP 36 mmHg (poor envelope, may be underestimated).  - Does not tolerate ERAs (macitentan, ambrisentan).  - RHC (4/20): mean RA 5, PA 85/22 mean 51, mean PCWP 12, CI  3.5, PVR 6 WU.  - Echo (4/20): EF > 65%, moderate LVH, RV moderately dilated with normal systolic function, moderate pericardial effusion, mild-moderate mitral stenosis with mean gradient 7/MVA by PHT 2.48 cm^2, mild AS mean gradient 12.  - Echo (4/21): EF 87-56%, grade 2 diastolic dysfunction, normal RV, PASP 50 mmHg, probably mild mitral stenosis with mean gradient 7 mmHg and MVA 2 cm^2 by PHT, mild AS.  - Echo (4/22): EF 60-65%, moderate LVH, moderate AS with mean gradient 27 mmHg and AVA 1.06 cm^2, moderate mitral stenosis with mean gradient 7 and MVA 1.2 cm^2, normal RV size and systolic function, PASP 41 mmHg.  - TEE (4/22): EF 60-65%, d-shaped septum, mild RV enlargement with mildly decreased RV systolic function, peak RV-RA gradient 38 mmHg, moderate AS with  AVA 1.31 cm^2 mean gradient 26 mmHg, mild-moderate MR with mild-moderate MS mean gradient 7 with MVA 1.7 cm^2 by VTI . Review of Systems: [y] = yes, [ ]  = no   General: Weight gain [ ] ; Weight loss [ ] ; Anorexia [ ] ; Fatigue [ ] ; Fever [ ] ; Chills [ ] ; Weakness [ ]   Cardiac: Chest pain/pressure [ ] ; Resting SOB [ ] ; Exertional SOB [Y ]; Orthopnea [ ] ; Pedal Edema [ ] ; Palpitations [ ] ; Syncope [ ] ; Presyncope [ ] ; Paroxysmal nocturnal dyspnea[ ]   Pulmonary: Cough [ ] ; Wheezing[ ] ; Hemoptysis[ ] ; Sputum [ ] ; Snoring [ ]   GI: Vomiting[ ] ; Dysphagia[ ] ; Melena[ ] ;  Hematochezia [ ] ; Heartburn[ ] ; Abdominal pain [ ] ; Constipation [ ] ; Diarrhea [ ] ; BRBPR [ ]   GU: Hematuria[ ] ; Dysuria [ ] ; Nocturia[ ]   Vascular: Pain in legs with walking [ ] ; Pain in feet with lying flat [ ] ; Non-healing sores [ ] ; Stroke [ ] ; TIA [ ] ; Slurred speech [ ] ;  Neuro: Headaches[ ] ; Vertigo[ ] ; Seizures[ ] ; Paresthesias[ ] ;Blurred vision [ ] ; Diplopia [ ] ; Vision changes [ ]   Ortho/Skin: Arthritis [ ] ; Joint pain [ ] ; Muscle pain [ ] ; Joint swelling [ ] ; Back Pain [ ] ; Rash [ ]   Psych: Depression[ ] ; Anxiety[ ]   Heme: Bleeding problems [ ] ; Clotting disorders [ ] ; Anemia [ Y]  Endocrine: Diabetes [ ] ; Thyroid dysfunction[ Y]  Home Medications Prior to Admission medications   Medication Sig Start Date End Date Taking? Authorizing Provider  ADEMPAS 2.5 MG TABS TAKE 1 TABLET THREE TIMES A DAY Patient taking differently: Take 2.5 mg by mouth 3 (three) times daily. 05/24/21  Yes Larey Dresser, MD  amLODipine (NORVASC) 10 MG tablet Take 1 tablet (10 mg total) by mouth daily. 04/27/21  Yes Larey Dresser, MD  atorvastatin (LIPITOR) 20 MG tablet TAKE 1 TABLET BY MOUTH IN  THE MORNING Patient taking differently: Take 20 mg by mouth daily. 01/12/21  Yes Saguier, Percell Miller, PA-C  cetirizine (ZYRTEC) 10 MG tablet Take 10 mg by mouth daily as needed for allergies.   Yes [provider]  cholecalciferol (VITAMIN D) 25 MCG (1000 UNIT) tablet Take 1,000 Units by mouth daily.   Yes [provider]  fenofibrate 160 MG tablet Take 1 tablet (160 mg total) by mouth every morning. 11/17/20  Yes Saguier, Percell Miller, PA-C  glucose blood (ACCU-CHEK AVIVA PLUS) test strip Check blood sugar TID dx: E11.9 04/07/21  Yes Saguier, Percell Miller, PA-C  levothyroxine (SYNTHROID) 125 MCG tablet Take 1 tablet (125 mcg total) by mouth daily before breakfast. 11/17/20 05/26/22 Yes Saguier, Percell Miller, PA-C  loperamide (IMODIUM A-D) 2 MG tablet Take 2 mg by mouth daily as needed for diarrhea or loose stools.   Yes  [provider]  loratadine (CLARITIN) 10 MG tablet Take 10 mg by mouth daily as needed for allergies.    Yes [provider]  metFORMIN (GLUCOPHAGE) 500 MG tablet Take 1 tablet (500 mg total) by mouth 2 (two) times daily with a meal. Patient taking differently: Take 500 mg by mouth See admin instructions. 500 mg every morning 500 mg in the afternoon if  blood sugar reading is 180 or higher. 11/17/20  Yes Saguier, Percell Miller, PA-C  metolazone (ZAROXOLYN) 2.5 MG tablet Take 1 tablet (2.5 mg total) by mouth once a week. Take potassium 37meq with this Patient taking differently: Take 2.5 mg by mouth every Saturday at 6 PM. 06/08/20  Yes Larey Dresser,  MD  metoprolol succinate (TOPROL XL) 25 MG 24 hr tablet Take 1 tablet (25 mg total) by mouth 2 (two) times daily. 11/17/20 11/17/21 Yes Saguier, Percell Miller, PA-C  Multiple Vitamins-Minerals (PRESERVISION AREDS 2 PO) Take 1 capsule by mouth 2 (two) times daily.   Yes [provider]  pantoprazole (PROTONIX) 40 MG tablet TAKE 1 TABLET BY MOUTH  TWICE DAILY Patient taking differently: Take 40 mg by mouth 2 (two) times daily. 04/07/21  Yes Saguier, Percell Miller, PA-C  torsemide (DEMADEX) 20 MG tablet Take 2 tablets (40 mg total) by mouth 2 (two) times daily. Patient taking differently: Take 20 mg by mouth 2 (two) times daily. 07/05/21  Yes Joette Catching, PA-C  UPTRAVI 1600 MCG TABS Take 1 tablet (1,600 mcg total) by mouth 2 (two) times daily. Take with 800 for a total of 2400 mg 01/22/21  Yes Larey Dresser, MD  UPTRAVI 800 MCG TABS Take 1 tablet (800 mcg total) by mouth 2 (two) times daily. Take with 1600 mcg for a total of 2400 mg 01/22/21  Yes Larey Dresser, MD  allopurinol (ZYLOPRIM) 100 MG tablet Take 1 tablet (100 mg total) by mouth daily. Patient not taking: No sig reported 05/26/21   Collier Salina, MD  ferrous sulfate 325 (65 FE) MG tablet Take 1 tablet (325 mg total) by mouth daily with breakfast. Morning and night 11/17/20  05/26/22  Saguier, Percell Miller, PA-C  potassium chloride (KLOR-CON) 10 MEQ tablet Take 3 tablets (30 mEq total) by mouth daily. Take an extra 58meq with metolazone Patient taking differently: Take 30 mEq by mouth See admin instructions. 30 meq once daily Take an extra 87meq with metolazone 07/09/21   Larey Dresser, MD  traMADol (ULTRAM) 50 MG tablet Take 1 tablet (50 mg total) by mouth every 6 (six) hours as needed for moderate pain or severe pain. Patient not taking: No sig reported 05/10/21   Mackie Pai, PA-C    Past Medical History: Past Medical History:  Diagnosis Date   Aortic atherosclerosis ([place]) 08/30/2017   Aortic stenosis 08/30/2017   CAD (coronary artery disease), native coronary artery 08/30/2017   CHF (congestive heart failure) ([facility])    Chronic diastolic heart failure ([place]) 08/30/2017   Diabetes mellitus with peripheral vascular disease ([place]) 07/14/2014   Hypertensive heart disease without CHF 08/30/2017   Hypothyroidism (acquired) 08/30/2017   Pulmonary hypertension ([place]) 07/04/2017    Past Surgical History: Past Surgical History:  Procedure Laterality Date   ANKLE SURGERY     APPENDECTOMY     DILATION AND CURETTAGE OF UTERUS     RIGHT HEART CATH N/A 01/01/2019   Procedure: RIGHT HEART CATH;  Surgeon: Larey Dresser, MD;  Location: [facility];  Service: Cardiovascular;  Laterality: N/A;   RIGHT/LEFT HEART CATH AND CORONARY ANGIOGRAPHY N/A 09/27/2017   Procedure: RIGHT/LEFT HEART CATH AND CORONARY ANGIOGRAPHY;  Surgeon: Sherren Mocha, MD;  Location: [facility];  Service: Cardiovascular;  Laterality: N/A;   TEE WITHOUT CARDIOVERSION N/A 01/07/2021   Procedure: TRANSESOPHAGEAL ECHOCARDIOGRAM (TEE);  Surgeon: Larey Dresser, MD;  Location: [facility] END[facility];  Service: Cardiovascular;  Laterality: N/A;    Family History: Family History  Problem Relation Age of Onset   Diabetes Mother    Bleeding Disorder Mother    Heart disease Father    Alzheimer's  disease Father    Diabetes Sister    Diabetes Brother    Heart disease Brother    Healthy Daughter    Healthy  Son     Social History: Social History   Socioeconomic History   Marital status: Divorced    Spouse name: Not on file   Number of children: Not on file   Years of education: Not on file   Highest education level: Not on file  Occupational History   Not on file  Tobacco Use   Smoking status: Never   Smokeless tobacco: Never  Vaping Use   Vaping Use: Never used  Substance and Sexual Activity   Alcohol use: Yes    Comment: Occasionally   Drug use: No   Sexual activity: Never  Other Topics Concern   Not on file  Social History Narrative   Not on file   Social Determinants of Health   Financial Resource Strain: Not on file  Food Insecurity: Not on file  Transportation Needs: Not on file  Physical Activity: Not on file  Stress: Not on file  Social Connections: Not on file    Allergies:  Allergies  Allergen Reactions   Other Palpitations    Surgical metal (stents, pins, plates) keeps pt from healing   Adhesive [Tape] Other (See Comments)    Irritates skin with rash   Nickel Rash    Objective:    Vital Signs:   Temp:  [97.9 F (36.6 C)-98.3 F (36.8 C)] 98.3 F (36.8 C) (11/07 0313) Pulse Rate:  [84-93] 93 (11/07 0314) Resp:  [19] 19 (11/07 0314) BP: (119-130)/(57-63) 130/63 (11/07 0314) SpO2:  [95 %-96 %] 96 % (11/07 0314) Weight:  [70.6 kg] 70.6 kg (11/07 0310) Last BM Date: 07/19/21  Weight change: Filed Weights   07/17/21 0843 07/18/21 0352 07/19/21 0310  Weight: 70.3 kg 70.5 kg 70.6 kg    Intake/Output:   Intake/Output Summary (Last 24 hours) at 07/19/2021 0816 Last data filed at 07/19/2021 0318 Gross per 24 hour  Intake 480 ml  Output --  Net 480 ml      Physical Exam    General:   No resp difficulty HEENT: normal Neck: supple. JVP to jaw  Carotids 2+ bilat; no bruits. No lymphadenopathy or thyromegaly appreciated. Cor: PMI  nondisplaced. Regular rate & rhythm. No rubs, gallops or murmurs. Lungs: clear Abdomen: soft, nontender, nondistended. No hepatosplenomegaly. No bruits or masses. Good bowel sounds. Extremities: no cyanosis, clubbing, rash, edema Neuro: alert & orientedx3, cranial nerves grossly intact. moves all 4 extremities w/o difficulty. Affect pleasant   Telemetry   SR 70-80s   EKG    07/18/21 SR 79 bpm   Labs   Basic Metabolic Panel: Recent Labs  Lab 07/16/21 2004 07/17/21 0700 07/17/21 1114 07/18/21 0342 07/19/21 0103  NA 134* 136 136 137 136  K 3.8 3.6 3.6 3.7 3.3*  CL 106 108 108 111 109  CO2 13* 16* 15* 14* 18*  GLUCOSE 146* 145* 225* 131* 141*  BUN 97* 95* 93* 89* 89*  CREATININE 4.30* 4.01* 4.05* 3.78* 3.43*  CALCIUM 8.4* 8.0* 8.0* 8.3* 8.2*  MG  --   --   --  1.9  --     Liver Function Tests: Recent Labs  Lab 07/16/21 2004  AST 12*  ALT 9  ALKPHOS 54  BILITOT 0.6  PROT 6.4*  ALBUMIN 3.4*   No results for input(s): LIPASE, AMYLASE in the last 168 hours. No results for input(s): AMMONIA in the last 168 hours.  CBC: Recent Labs  Lab 07/16/21 2004 07/18/21 0727 07/19/21 0103  WBC 8.6 6.6 7.7  NEUTROABS 7.2  --   --  HGB 8.8* 7.8* 7.4*  HCT 28.0* 24.5* 23.5*  MCV 86.2 85.1 85.8  PLT 339 296 287    Cardiac Enzymes: No results for input(s): CKTOTAL, CKMB, CKMBINDEX, TROPONINI in the last 168 hours.  BNP: BNP (last 3 results) Recent Labs    06/18/21 1225 07/17/21 0336 07/19/21 0103  BNP 986.2* 1,247.8* 1,758.6*    ProBNP (last 3 results) Recent Labs    01/26/21 1313  PROBNP 823.0*     CBG: No results for input(s): GLUCAP in the last 168 hours.  Coagulation Studies: No results for input(s): LABPROT, INR in the last 72 hours.   Imaging   DG CHEST PORT 1 VIEW  Result Date: 07/18/2021 CLINICAL DATA:  Dyspnea EXAM: PORTABLE CHEST 1 VIEW COMPARISON:  July 16, 2021 FINDINGS: Enlarged cardiac silhouette. Tortuosity and calcific  atherosclerotic disease of the aorta. Streaky bilateral airspace opacities with lower lobe predominance. Possible bilateral pleural effusions, small. Osseous structures are without acute abnormality. Soft tissues are grossly normal. IMPRESSION: 1. Streaky bilateral airspace opacities and increased interstitial markings may represent pulmonary edema, with lower lobe atelectasis and small pleural effusions. 2. Enlarged cardiac silhouette. Electronically Signed   By: Fidela Salisbury M.D.   On: 07/18/2021 12:12   ECHOCARDIOGRAM COMPLETE  Result Date: 07/18/2021    ECHOCARDIOGRAM REPORT   Patient Name:   Alisha [name] [facility] Date of Exam: 07/18/2021 Medical Rec #:  [id number]      Height:       65.0 in Accession #:    [id number]     Weight:       155.5 lb Date of Birth:  August 09, 1941     BSA:          1.777 m Patient Age:    12 years       BP:           135/69 mmHg Patient Gender: F              HR:           95 bpm. Exam Location:  Inpatient Procedure: 2D Echo, Color Doppler and Cardiac Doppler Indications:    Dyspnea  History:        Patient has prior history of Echocardiogram examinations, most                 recent 01/07/2021. CHF, CAD, Pulmonary HTN; Risk                 Factors:Diabetes.  Sonographer:    Bernadene Person RDCS Referring Phys: [place]  1. Left ventricular ejection fraction, by estimation, is >75%. The left ventricle has hyperdynamic function. The left ventricle has no regional wall motion abnormalities. There is moderate left ventricular hypertrophy. Left ventricular diastolic parameters are consistent with Grade II diastolic dysfunction (pseudonormalization). Elevated left atrial pressure.  2. The ventricular septum is flattened in systole and diastole consistent with RV pressure and volume overload. Relatively preserved RV anular vertical motion however decreased free wall contraction in the horizontal plane, TAPSE and S' likely poor indicators for RV function. . Right  ventricular systolic function is moderately reduced. The right ventricular size is moderately enlarged. Tricuspid regurgitation signal is inadequate for assessing PA pressure.  3. Left atrial size was moderately dilated.  4. A small pericardial effusion is present. The pericardial effusion is circumferential.  5. The mitral valve is abnormal. Mild to moderate mitral valve regurgitation. Moderate to severe mitral stenosis. Moderate mitral annular calcification. The mean mitral valve  gradient is 10.0 mmHg.  6. Tricuspid valve regurgitation is mild to moderate.  7. The aortic valve is tricuspid. There is moderate calcification of the aortic valve. There is moderate thickening of the aortic valve. Aortic valve regurgitation is mild. Moderate aortic valve stenosis.  8. The inferior vena cava is normal in size with <50% respiratory variability, suggesting right atrial pressure of 8 mmHg. FINDINGS  Left Ventricle: Left ventricular ejection fraction, by estimation, is >75%. The left ventricle has hyperdynamic function. The left ventricle has no regional wall motion abnormalities. The left ventricular internal cavity size was normal in size. There is moderate left ventricular hypertrophy. Left ventricular diastolic parameters are consistent with Grade II diastolic dysfunction (pseudonormalization). Elevated left atrial pressure. Right Ventricle: The ventricular septum is flattened in systole and diastole consistent with RV pressure and volume overload. Relatively preserved RV anular vertical motion however decreased free wall contraction in the horizontal plane, TAPSE and S' likely poor indicators for RV function. The right ventricular size is moderately enlarged. Right vetricular wall thickness was not well visualized. Right ventricular systolic function is moderately reduced. Tricuspid regurgitation signal is inadequate for assessing PA pressure. Left Atrium: Left atrial size was moderately dilated. Right Atrium: Right  atrial size was not well visualized. Pericardium: A small pericardial effusion is present. The pericardial effusion is circumferential. Mitral Valve: The mitral valve is abnormal. There is moderate thickening of the mitral valve leaflet(s). There is moderate calcification of the mitral valve leaflet(s). Moderate mitral annular calcification. Mild to moderate mitral valve regurgitation. Moderate to severe mitral valve stenosis. The mean mitral valve gradient is 10.0 mmHg. Tricuspid Valve: The tricuspid valve is not well visualized. Tricuspid valve regurgitation is mild to moderate. No evidence of tricuspid stenosis. Aortic Valve: The aortic valve is tricuspid. There is moderate calcification of the aortic valve. There is moderate thickening of the aortic valve. There is moderate aortic valve annular calcification. Aortic valve regurgitation is mild. Aortic regurgitation PHT measures 314 msec. Moderate aortic stenosis is present. Aortic valve mean gradient measures 27.6 mmHg. Aortic valve peak gradient measures 44.8 mmHg. Aortic valve area, by VTI measures 1.11 cm. Pulmonic Valve: The pulmonic valve was not well visualized. Pulmonic valve regurgitation is not visualized. No evidence of pulmonic stenosis. Aorta: The aortic root is normal in size and structure. Venous: The inferior vena cava is normal in size with less than 50% respiratory variability, suggesting right atrial pressure of 8 mmHg. IAS/Shunts: The interatrial septum was not well visualized.  LEFT VENTRICLE PLAX 2D LVIDd:         3.80 cm   Diastology LVIDs:         2.50 cm   LV e' medial:    4.45 cm/s LV PW:         1.30 cm   LV E/e' medial:  48.5 LV IVS:        1.20 cm   LV e' lateral:   5.35 cm/s LVOT diam:     1.90 cm   LV E/e' lateral: 40.4 LV SV:         82 LV SV Index:   46 LVOT Area:     2.84 cm  RIGHT VENTRICLE RV S prime:     11.20 cm/s TAPSE (M-mode): 1.6 cm LEFT ATRIUM             Index        RIGHT ATRIUM           Index LA diam:  5.10  cm 2.87 cm/m   RA Area:     14.60 cm LA Vol (A2C):   74.9 ml 42.14 ml/m  RA Volume:   34.40 ml  19.35 ml/m LA Vol (A4C):   78.0 ml 43.88 ml/m LA Biplane Vol: 75.8 ml 42.64 ml/m  AORTIC VALVE AV Area (Vmax):    1.17 cm AV Area (Vmean):   1.04 cm AV Area (VTI):     1.11 cm AV Vmax:           334.80 cm/s AV Vmean:          247.600 cm/s AV VTI:            0.740 m AV Peak Grad:      44.8 mmHg AV Mean Grad:      27.6 mmHg LVOT Vmax:         138.00 cm/s LVOT Vmean:        91.000 cm/s LVOT VTI:          0.290 m LVOT/AV VTI ratio: 0.39 AI PHT:            314 msec  AORTA Ao Root diam: 3.30 cm Ao Asc diam:  3.20 cm MITRAL VALVE MV Area (PHT): 3.65 cm     SHUNTS MV Mean grad:  10.0 mmHg    Systemic VTI:  0.29 m MV Decel Time: 208 msec     Systemic Diam: 1.90 cm MV E velocity: 216.00 cm/s MV A velocity: 145.00 cm/s MV E/A ratio:  1.49 Carlyle Dolly MD Electronically signed by Carlyle Dolly MD Signature Date/Time: 07/18/2021/3:50:11 PM    Final      Medications:     Current Medications:  atorvastatin  20 mg Oral Daily   levothyroxine  125 mcg Oral QAC breakfast   metoprolol succinate  25 mg Oral BID   pantoprazole  40 mg Oral BID   Riociguat  2.5 mg Oral TID   Selexipag  1,600 mcg Oral BID   Selexipag  800 mcg Oral BID   sodium bicarbonate  650 mg Oral TID   sodium chloride flush  3 mL Intravenous Q12H   sodium chloride flush  3 mL Intravenous Q12H    Infusions:     Patient Profile   Ms Robicheaux is a 80 year old with history of   of diabetes, HTN, PAD, hyperlipidemia, IDA, DVT, OSA, 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   Admitted with AKI.  Assessment/Plan   AKI on CKD Stage IV Creatinine baseline ~ 1.8 Had C Diff on 10/5 ? Dehydration.  -Admit 4.3---> today 3.4 -Follow bmet daily   2. A/C Diastolic HF/ RV Failure  Echo - ->EF 75% Grade II DD, RV moderately reduced, MVR mod-severe MS, MVG 10  -Given IV fluids on admit due to AKI. Developed volume  overload. Given dose of IV lasix. I/O not accurate.  -BNP > 1700. Remains volume overloaded. Start 80 mg IV lasix twice a day.  -RHC to further assess hemodynamics.   3. PAH  Severe PAH with PVR 7 WU on 1/19 RHC.  Etiology uncertain.  She has a history of DVT remotely but V/Q scan in 2/19 did not show evidence for chronic PE. She has OSA and uses CPAP.  Serologic workup showed only positive ANA but when additional serologies were done, they were all negative (see above).  CT chest with no ILD or emphysema.  Valvular heart disease does not appear significant enough to  cause this degree of PH.  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  Echo in 4/19 showed that the RV is moderately dilated with mildly decreased systolic function, PA systolic pressure estimation was only 36 mmHg but poor envelope and likely underestimated. She did not tolerate Opsumit or ambrisentan due to significant lower extremity edema and worsening dyspnea.  [place] in 4/20 showed severe pulmonary hypertension with normal filling pressures and preserved cardiac output.  Echo in 4/21 showed normal RV with PASP 50 mmHg, echo 4/22 showed normal RV with PASP estimated 41 mmHg. TEE in 4/22 showed D-shaped septum with mild RV enlargement/mildly decreased RV systolic function. On combination therapy- UPTRAVI 2400 mcg + adempas 2.5 mg tid. Intolerant ambrisentan and opsumit.  RHC today to further assess.   4. Anemia  -H/O AVMs -Most recent transfusion was 11/3  -Hgb today 7.4  5. Mitral Valve Stenosis  - ECHO this admit with worsening mitral valve stenosis. MVR mod-severe MS, MVG 10  -Will need TEE later this week.  6. H/O C Diff 06/16/2021  On enteric precautions.   7.Aortic Stenosis  Moderate by TEE 12/2020   8. OSA Continue CPAP nightly   RHC today. The patient understands that risks included but are not limited to stroke (1 in 1000), death (1 in 15), kidney failure [usually temporary] (1 in 500), bleeding (1 in 200), allergic reaction [possibly  serious] (1 in 200).  The patient understands and agrees to proceed.    Length of Stay: 2  Darrick Grinder, NP  07/19/2021, 8:16 AM  Advanced Heart Failure Team Pager [phone] (M-F; 7a - 5p)  Please contact [facility] for night-coverage after hours (4p -7a ) and weekends on [email or web address]  Patient seen with NP, agree with the above note.    Complicated history as noted above.  Has had group 1 PAH, valvular heart disease (in the past, moderate AS and mild-moderate MS most recently assessed by TEE in 4/22), CKD stage 3, and GI bleeding from AVMs.   More recently, has had worsening renal dysfunction and torsemide/metolazone had been held since the end of October.  She became steadily more short of breath and was admitted.  Admission creatinine was 4.4 (prior baseline around 1.6), hgb has been trending down as well (7.4 today).   She received 1 dose of IV Lasix yesterday.  She remains short of breath and mildly tachypneic.  Echo done yesterday showed normal LV EF but is concerning for worsening valvular heart disease with at least moderate (versus mod-severe) aortic stenosis and moderate-severe mitral stenosis.  Creatinine down to 3.43 today.    General: NAD Neck: JVP 12-14 cm, no thyromegaly or thyroid nodule.  Lungs: Clear to auscultation bilaterally with normal respiratory effort. CV: Nondisplaced PMI.  Heart regular S1/S2, no S3/S4, 3/6 SEM RUSB with decreased S2.  No peripheral edema.  No carotid bruit.  Normal pedal pulses.  Abdomen: Soft, nontender, no hepatosplenomegaly, mild distention.  Skin: Intact without lesions or rashes.  Neurologic: Alert and oriented x 3.  Psych: Normal affect. Extremities: No clubbing or cyanosis.  HEENT: Normal.   Assessment/Plan: 1. Pulmonary hypertension with RV failure:  Severe PAH with PVR 7 WU on 1/19 RHC.  Etiology uncertain.  She has a history of DVT remotely but V/Q scan in 2/19 did not show evidence for chronic PE. She has OSA and uses CPAP.  Serologic  workup showed only positive ANA but when additional serologies were done, they were all negative (see above).  CT chest with no ILD or  emphysema.  Valvular heart disease does not appear significant enough to cause this degree of PH.  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  Echo in 4/19 showed that the RV is moderately dilated with mildly decreased systolic function, PA systolic pressure estimation was only 36 mmHg but poor envelope and likely underestimated. She did not tolerate Opsumit or ambrisentan due to significant lower extremity edema and worsening dyspnea.  [place] in 4/20 showed severe pulmonary hypertension with normal filling pressures and preserved cardiac output.  Echo in 4/21 showed normal RV with PASP 50 mmHg, echo 4/22 showed normal RV with PASP estimated 41 mmHg. TEE in 4/22 showed D-shaped septum with mild RV enlargement/mildly decreased RV systolic function.  At baseline, has oxygen for exertion.  Echo this admission with D-shaped septum, moderate RVE and moderate RV dysfunction, also concerning for worsening valvular HD.  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   - Continue Adempas.  - Continue Uptravi 2400 mcg BID.   - RHC today to assess PA pressure and filling pressures.  Discussed risks/benefits with patient and she agrees to procedure.  2. Acute on chronic diastolic CHF: In setting of pulmonary hypertension and valvular disease.  Worsened symptoms with volume overload on exam.  Unfortunately, also appears to have developed progressive cardiorenal syndrome.  - Lasix 80 mg IV bid for now, RHC as above.  3. Syncope: Suspect related to severe pulmonary hypertension.  None recently.   4. 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  With heavy calcification, I do not think that she would be a mitral valvuloplasty candidate.  AS also has been moderate in the past.  Echo this admission reviewed, I am worried for progressive  valvular heart disease leading to worsening volume status as well as worsening cardiorenal syndrome.  Echo this admission with moderate-severe aortic stenosis (mean gradient 34 mmHg with AVA around 1.0 cm^2) and moderate-severe mitral stenosis (mean gradient 10 mmHg).  - Once she is better diuresed, she will need TEE to assess the valves.  - Could potentially undergo TAVR for aortic valve but MV looks too calcified for valvuloplasty.  She would be a very high risk surgical patient.  Our options are likely going to be limited.  5. H/o DVT: No chronic PE on prior V/Q scan.   6. OSA: Continue CPAP qHS.  7. AKI on CKD Stage 3: Prior baseline creatinine has been around 1.6, recently with significant increase.  Admitted with creatinine at 4.4, now down to 3.43.  Concern for progressive cardiorenal syndrome possibly with worsening valvular HD.  8. H/o GI bleeding: Recurrent.  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 Hgb down to 7.4.  - With CHF/volume overload currently, would transfuse hgb < 7.  - Would involve GI, likely will need repeat scope when volume status improved.   Loralie Champagne 07/19/2021 9:43 AM

## 2021-07-19 NOTE — Consult Note (Addendum)
Advanced Heart Failure Team Consult Note   Primary Physician: Mackie Pai, PA-C PCP-Cardiologist:  None Hematologist: Dr Roseanne Reno   Reason for Consultation: Heart Failure   HPI:    Alisha [name] is seen today for evaluation of heart failure at the request of Dr Venetia Constable.   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   Extensive PAH work up. VQ scan 10/2017 negative for PE. Most recent RHC  12/2020--> mean RA 5, PA 85/22 mean 51, mean PCWP 12, CI  3.5, PVR 6 WU.   PAH - on dual therapy with UPTRAVI 2400 mcg + adempas 2.5 mg tid. Intolerant ambrisentan and opsumit.   On 06/16/21 she had C DIFF. Treated with oral vancomycin.   Followed closed in the HF clinic. Last seen  06/18/21. She was was continued on torsemide 60 mg twice a day and metolazone weekly.    Over the next few week creatinine worsening. Torsemide was cut back to 40 mg twice a day and metolazone was stopped .   Diuretics have held by Nephrology 10//28/22.    On 07/15/21 given 1UPRBCs . H/O anemia and AVMs. Followed at [facility]   repeat BMET on 07/16/21 and creatinine was up to 4.4. She was instructed to go the ED.   Admitted with AKI and increased shortness of breath. Diuretics held. Given IV fluids. Yesterday Cardiology consulted. CXR suggestive pulmonary edema. Volume overloaded. Given 60 mg IV lasix x1.   Echo this admit - mitral stenosis worse.   Pertinent Labs SARs 2 Flu negative.  Bicarb on admit 13. Creatinine 4.4>4>3.8>3.4 BNP 1247>1758  Hgb 7.4    Cardiac Testing  Echo (4/18) with EF 65%, mild MR, mild TR, mild mitral stenosis, moderate pulmonary hypertension. - CTA chest 10/17: No PE.  - RHC (1/19): mean RA 11, PA 87/31 mean 51, mean PCWP 6, CI 3.58, PVR 7 WU.  - PFTs (1/19): mild obstruction, some restriction => severely decreased DLCO. - High resolution CT  chest (1/19): no interstitial lung disease or emphysema. 7 mm nodule RUL.  - anti-Jo1 negative, RF borderline positive, SCL-70 negative, HIV negative. ANA + => anti-dsDNA negative, SSA/SSB negative, anti-Sm negative, anti-RNP negative. - V/Q scan 2/19: No e/o chronic PE.  - Echo (4/19): EF 60-65%, mild LVH, mild aortic stenosis, mild mitral stenosis, moderately dilated RV with mildly decreased systolic function, PASP 36 mmHg (poor envelope, may be underestimated).  - Does not tolerate ERAs (macitentan, ambrisentan).  - RHC (4/20): mean RA 5, PA 85/22 mean 51, mean PCWP 12, CI  3.5, PVR 6 WU.  - Echo (4/20): EF > 65%, moderate LVH, RV moderately dilated with normal systolic function, moderate pericardial effusion, mild-moderate mitral stenosis with mean gradient 7/MVA by PHT 2.48 cm^2, mild AS mean gradient 12.  - Echo (4/21): EF 25-05%, grade 2 diastolic dysfunction, normal RV, PASP 50 mmHg, probably mild mitral stenosis with mean gradient 7 mmHg and MVA 2 cm^2 by PHT, mild AS.  - Echo (4/22): EF 60-65%, moderate LVH, moderate AS with mean gradient 27 mmHg and AVA 1.06 cm^2, moderate mitral stenosis with mean gradient 7 and MVA 1.2 cm^2, normal RV size and systolic function, PASP 41 mmHg.  - TEE (4/22): EF 60-65%, d-shaped septum, mild RV enlargement with mildly decreased RV systolic function, peak RV-RA gradient 38 mmHg, moderate AS with  AVA 1.31 cm^2 mean gradient 26 mmHg, mild-moderate MR with mild-moderate Alisha mean gradient 7 with MVA 1.7 cm^2 by VTI . Review of Systems: [y] = yes, [ ]  = no   General: Weight gain [ ] ; Weight loss [ ] ; Anorexia [ ] ; Fatigue [ ] ; Fever [ ] ; Chills [ ] ; Weakness [ ]   Cardiac: Chest pain/pressure [ ] ; Resting SOB [ ] ; Exertional SOB [Y ]; Orthopnea [ ] ; Pedal Edema [ ] ; Palpitations [ ] ; Syncope [ ] ; Presyncope [ ] ; Paroxysmal nocturnal dyspnea[ ]   Pulmonary: Cough [ ] ; Wheezing[ ] ; Hemoptysis[ ] ; Sputum [ ] ; Snoring [ ]   GI: Vomiting[ ] ; Dysphagia[ ] ; Melena[ ] ;  Hematochezia [ ] ; Heartburn[ ] ; Abdominal pain [ ] ; Constipation [ ] ; Diarrhea [ ] ; BRBPR [ ]   GU: Hematuria[ ] ; Dysuria [ ] ; Nocturia[ ]   Vascular: Pain in legs with walking [ ] ; Pain in feet with lying flat [ ] ; Non-healing sores [ ] ; Stroke [ ] ; TIA [ ] ; Slurred speech [ ] ;  Neuro: Headaches[ ] ; Vertigo[ ] ; Seizures[ ] ; Paresthesias[ ] ;Blurred vision [ ] ; Diplopia [ ] ; Vision changes [ ]   Ortho/Skin: Arthritis [ ] ; Joint pain [ ] ; Muscle pain [ ] ; Joint swelling [ ] ; Back Pain [ ] ; Rash [ ]   Psych: Depression[ ] ; Anxiety[ ]   Heme: Bleeding problems [ ] ; Clotting disorders [ ] ; Anemia [ Y]  Endocrine: Diabetes [ ] ; Thyroid dysfunction[ Y]  Home Medications Prior to Admission medications   Medication Sig Start Date End Date Taking? Authorizing Provider  ADEMPAS 2.5 MG TABS TAKE 1 TABLET THREE TIMES A DAY Patient taking differently: Take 2.5 mg by mouth 3 (three) times daily. 05/24/21  Yes Larey Dresser, MD  amLODipine (NORVASC) 10 MG tablet Take 1 tablet (10 mg total) by mouth daily. 04/27/21  Yes Larey Dresser, MD  atorvastatin (LIPITOR) 20 MG tablet TAKE 1 TABLET BY MOUTH IN  THE MORNING Patient taking differently: Take 20 mg by mouth daily. 01/12/21  Yes Saguier, Percell Miller, PA-C  cetirizine (ZYRTEC) 10 MG tablet Take 10 mg by mouth daily as needed for allergies.   Yes [provider]  cholecalciferol (VITAMIN D) 25 MCG (1000 UNIT) tablet Take 1,000 Units by mouth daily.   Yes [provider]  fenofibrate 160 MG tablet Take 1 tablet (160 mg total) by mouth every morning. 11/17/20  Yes Saguier, Percell Miller, PA-C  glucose blood (ACCU-CHEK AVIVA PLUS) test strip Check blood sugar TID dx: E11.9 04/07/21  Yes Saguier, Percell Miller, PA-C  levothyroxine (SYNTHROID) 125 MCG tablet Take 1 tablet (125 mcg total) by mouth daily before breakfast. 11/17/20 05/26/22 Yes Saguier, Percell Miller, PA-C  loperamide (IMODIUM A-D) 2 MG tablet Take 2 mg by mouth daily as needed for diarrhea or loose stools.   Yes  [provider]  loratadine (CLARITIN) 10 MG tablet Take 10 mg by mouth daily as needed for allergies.    Yes [provider]  metFORMIN (GLUCOPHAGE) 500 MG tablet Take 1 tablet (500 mg total) by mouth 2 (two) times daily with a meal. Patient taking differently: Take 500 mg by mouth See admin instructions. 500 mg every morning 500 mg in the afternoon if  blood sugar reading is 180 or higher. 11/17/20  Yes Saguier, Percell Miller, PA-C  metolazone (ZAROXOLYN) 2.5 MG tablet Take 1 tablet (2.5 mg total) by mouth once a week. Take potassium 32meq with this Patient taking differently: Take 2.5 mg by mouth every Saturday at 6 PM. 06/08/20  Yes Larey Dresser,  MD  metoprolol succinate (TOPROL XL) 25 MG 24 hr tablet Take 1 tablet (25 mg total) by mouth 2 (two) times daily. 11/17/20 11/17/21 Yes Saguier, Percell Miller, PA-C  Multiple Vitamins-Minerals (PRESERVISION AREDS 2 PO) Take 1 capsule by mouth 2 (two) times daily.   Yes [provider]  pantoprazole (PROTONIX) 40 MG tablet TAKE 1 TABLET BY MOUTH  TWICE DAILY Patient taking differently: Take 40 mg by mouth 2 (two) times daily. 04/07/21  Yes Saguier, Percell Miller, PA-C  torsemide (DEMADEX) 20 MG tablet Take 2 tablets (40 mg total) by mouth 2 (two) times daily. Patient taking differently: Take 20 mg by mouth 2 (two) times daily. 07/05/21  Yes Joette Catching, PA-C  UPTRAVI 1600 MCG TABS Take 1 tablet (1,600 mcg total) by mouth 2 (two) times daily. Take with 800 for a total of 2400 mg 01/22/21  Yes Larey Dresser, MD  UPTRAVI 800 MCG TABS Take 1 tablet (800 mcg total) by mouth 2 (two) times daily. Take with 1600 mcg for a total of 2400 mg 01/22/21  Yes Larey Dresser, MD  allopurinol (ZYLOPRIM) 100 MG tablet Take 1 tablet (100 mg total) by mouth daily. Patient not taking: No sig reported 05/26/21   Collier Salina, MD  ferrous sulfate 325 (65 FE) MG tablet Take 1 tablet (325 mg total) by mouth daily with breakfast. Morning and night 11/17/20  05/26/22  Saguier, Percell Miller, PA-C  potassium chloride (KLOR-CON) 10 MEQ tablet Take 3 tablets (30 mEq total) by mouth daily. Take an extra 42meq with metolazone Patient taking differently: Take 30 mEq by mouth See admin instructions. 30 meq once daily Take an extra 31meq with metolazone 07/09/21   Larey Dresser, MD  traMADol (ULTRAM) 50 MG tablet Take 1 tablet (50 mg total) by mouth every 6 (six) hours as needed for moderate pain or severe pain. Patient not taking: No sig reported 05/10/21   Mackie Pai, PA-C    Past Medical History: Past Medical History:  Diagnosis Date   Aortic atherosclerosis ([place]) 08/30/2017   Aortic stenosis 08/30/2017   CAD (coronary artery disease), native coronary artery 08/30/2017   CHF (congestive heart failure) (H[facility])    Chronic diastolic heart failure ([place]) 08/30/2017   Diabetes mellitus with peripheral vascular disease ([place]) 07/14/2014   Hypertensive heart disease without CHF 08/30/2017   Hypothyroidism (acquired) 08/30/2017   Pulmonary hypertension ([place]) 07/04/2017    Past Surgical History: Past Surgical History:  Procedure Laterality Date   ANKLE SURGERY     APPENDECTOMY     DILATION AND CURETTAGE OF UTERUS     RIGHT HEART CATH N/A 01/01/2019   Procedure: RIGHT HEART CATH;  Surgeon: Larey Dresser, MD;  Location: [place] [facility];  Service: Cardiovascular;  Laterality: N/A;   RIGHT/LEFT HEART CATH AND CORONARY ANGIOGRAPHY N/A 09/27/2017   Procedure: RIGHT/LEFT HEART CATH AND CORONARY ANGIOGRAPHY;  Surgeon: Sherren Mocha, MD;  Location: [place] [facility];  Service: Cardiovascular;  Laterality: N/A;   TEE WITHOUT CARDIOVERSION N/A 01/07/2021   Procedure: TRANSESOPHAGEAL ECHOCARDIOGRAM (TEE);  Surgeon: Larey Dresser, MD;  Location[facility] ENDOSCOPY;  Service: Cardiovascular;  Laterality: N/A;    Family History: Family History  Problem Relation Age of Onset   Diabetes Mother    Bleeding Disorder Mother    Heart disease Father    Alzheimer's  disease Father    Diabetes Sister    Diabetes Brother    Heart disease Brother    Healthy Daughter    Healthy  Son     Social History: Social History   Socioeconomic History   Marital status: Divorced    Spouse name: Not on file   Number of children: Not on file   Years of education: Not on file   Highest education level: Not on file  Occupational History   Not on file  Tobacco Use   Smoking status: Never   Smokeless tobacco: Never  Vaping Use   Vaping Use: Never used  Substance and Sexual Activity   Alcohol use: Yes    Comment: Occasionally   Drug use: No   Sexual activity: Never  Other Topics Concern   Not on file  Social History Narrative   Not on file   Social Determinants of Health   Financial Resource Strain: Not on file  Food Insecurity: Not on file  Transportation Needs: Not on file  Physical Activity: Not on file  Stress: Not on file  Social Connections: Not on file    Allergies:  Allergies  Allergen Reactions   Other Palpitations    Surgical metal (stents, pins, plates) keeps pt from healing   Adhesive [Tape] Other (See Comments)    Irritates skin with rash   Nickel Rash    Objective:    Vital Signs:   Temp:  [97.9 F (36.6 C)-98.3 F (36.8 C)] 98.3 F (36.8 C) (11/07 0313) Pulse Rate:  [84-93] 93 (11/07 0314) Resp:  [19] 19 (11/07 0314) BP: (119-130)/(57-63) 130/63 (11/07 0314) SpO2:  [95 %-96 %] 96 % (11/07 0314) Weight:  [70.6 kg] 70.6 kg (11/07 0310) Last BM Date: 07/19/21  Weight change: Filed Weights   07/17/21 0843 07/18/21 0352 07/19/21 0310  Weight: 70.3 kg 70.5 kg 70.6 kg    Intake/Output:   Intake/Output Summary (Last 24 hours) at 07/19/2021 0816 Last data filed at 07/19/2021 0318 Gross per 24 hour  Intake 480 ml  Output --  Net 480 ml      Physical Exam    General:   No resp difficulty HEENT: normal Neck: supple. JVP to jaw  Carotids 2+ bilat; no bruits. No lymphadenopathy or thyromegaly appreciated. Cor: PMI  nondisplaced. Regular rate & rhythm. No rubs, gallops or murmurs. Lungs: clear Abdomen: soft, nontender, nondistended. No hepatosplenomegaly. No bruits or masses. Good bowel sounds. Extremities: no cyanosis, clubbing, rash, edema Neuro: alert & orientedx3, cranial nerves grossly intact. moves all 4 extremities w/o difficulty. Affect pleasant   Telemetry   SR 70-80s   EKG    07/18/21 SR 79 bpm   Labs   Basic Metabolic Panel: Recent Labs  Lab 07/16/21 2004 07/17/21 0700 07/17/21 1114 07/18/21 0342 07/19/21 0103  NA 134* 136 136 137 136  K 3.8 3.6 3.6 3.7 3.3*  CL 106 108 108 111 109  CO2 13* 16* 15* 14* 18*  GLUCOSE 146* 145* 225* 131* 141*  BUN 97* 95* 93* 89* 89*  CREATININE 4.30* 4.01* 4.05* 3.78* 3.43*  CALCIUM 8.4* 8.0* 8.0* 8.3* 8.2*  MG  --   --   --  1.9  --     Liver Function Tests: Recent Labs  Lab 07/16/21 2004  AST 12*  ALT 9  ALKPHOS 54  BILITOT 0.6  PROT 6.4*  ALBUMIN 3.4*   No results for input(s): LIPASE, AMYLASE in the last 168 hours. No results for input(s): AMMONIA in the last 168 hours.  CBC: Recent Labs  Lab 07/16/21 2004 07/18/21 0727 07/19/21 0103  WBC 8.6 6.6 7.7  NEUTROABS 7.2  --   --  HGB 8.8* 7.8* 7.4*  HCT 28.0* 24.5* 23.5*  MCV 86.2 85.1 85.8  PLT 339 296 287    Cardiac Enzymes: No results for input(s): CKTOTAL, CKMB, CKMBINDEX, TROPONINI in the last 168 hours.  BNP: BNP (last 3 results) Recent Labs    06/18/21 1225 07/17/21 0336 07/19/21 0103  BNP 986.2* 1,247.8* 1,758.6*    ProBNP (last 3 results) Recent Labs    01/26/21 1313  PROBNP 823.0*     CBG: No results for input(s): GLUCAP in the last 168 hours.  Coagulation Studies: No results for input(s): LABPROT, INR in the last 72 hours.   Imaging   DG CHEST PORT 1 VIEW  Result Date: 07/18/2021 CLINICAL DATA:  Dyspnea EXAM: PORTABLE CHEST 1 VIEW COMPARISON:  July 16, 2021 FINDINGS: Enlarged cardiac silhouette. Tortuosity and calcific  atherosclerotic disease of the aorta. Streaky bilateral airspace opacities with lower lobe predominance. Possible bilateral pleural effusions, small. Osseous structures are without acute abnormality. Soft tissues are grossly normal. IMPRESSION: 1. Streaky bilateral airspace opacities and increased interstitial markings may represent pulmonary edema, with lower lobe atelectasis and small pleural effusions. 2. Enlarged cardiac silhouette. Electronically Signed   By: Fidela Salisbury M.D.   On: 07/18/2021 12:12   ECHOCARDIOGRAM COMPLETE  Result Date: 07/18/2021    ECHOCARDIOGRAM REPORT   Patient Name:   Alisha [name] [facility] Date of Exam: 07/18/2021 Medical Rec #:  [id number]      Height:       65.0 in Accession #:    [id number]     Weight:       155.5 lb Date of Birth:  Oct 08, 1940     BSA:          1.777 m Patient Age:    35 years       BP:           135/69 mmHg Patient Gender: F              HR:           95 bpm. Exam Location:  Inpatient Procedure: 2D Echo, Color Doppler and Cardiac Doppler Indications:    Dyspnea  History:        Patient has prior history of Echocardiogram examinations, most                 recent 01/07/2021. CHF, CAD, Pulmonary HTN; Risk                 Factors:Diabetes.  Sonographer:    Bernadene Person RDCS Referring Phys: [place]  1. Left ventricular ejection fraction, by estimation, is >75%. The left ventricle has hyperdynamic function. The left ventricle has no regional wall motion abnormalities. There is moderate left ventricular hypertrophy. Left ventricular diastolic parameters are consistent with Grade II diastolic dysfunction (pseudonormalization). Elevated left atrial pressure.  2. The ventricular septum is flattened in systole and diastole consistent with RV pressure and volume overload. Relatively preserved RV anular vertical motion however decreased free wall contraction in the horizontal plane, TAPSE and S' likely poor indicators for RV function. . Right  ventricular systolic function is moderately reduced. The right ventricular size is moderately enlarged. Tricuspid regurgitation signal is inadequate for assessing PA pressure.  3. Left atrial size was moderately dilated.  4. A small pericardial effusion is present. The pericardial effusion is circumferential.  5. The mitral valve is abnormal. Mild to moderate mitral valve regurgitation. Moderate to severe mitral stenosis. Moderate mitral annular calcification. The mean mitral valve  gradient is 10.0 mmHg.  6. Tricuspid valve regurgitation is mild to moderate.  7. The aortic valve is tricuspid. There is moderate calcification of the aortic valve. There is moderate thickening of the aortic valve. Aortic valve regurgitation is mild. Moderate aortic valve stenosis.  8. The inferior vena cava is normal in size with <50% respiratory variability, suggesting right atrial pressure of 8 mmHg. FINDINGS  Left Ventricle: Left ventricular ejection fraction, by estimation, is >75%. The left ventricle has hyperdynamic function. The left ventricle has no regional wall motion abnormalities. The left ventricular internal cavity size was normal in size. There is moderate left ventricular hypertrophy. Left ventricular diastolic parameters are consistent with Grade II diastolic dysfunction (pseudonormalization). Elevated left atrial pressure. Right Ventricle: The ventricular septum is flattened in systole and diastole consistent with RV pressure and volume overload. Relatively preserved RV anular vertical motion however decreased free wall contraction in the horizontal plane, TAPSE and S' likely poor indicators for RV function. The right ventricular size is moderately enlarged. Right vetricular wall thickness was not well visualized. Right ventricular systolic function is moderately reduced. Tricuspid regurgitation signal is inadequate for assessing PA pressure. Left Atrium: Left atrial size was moderately dilated. Right Atrium: Right  atrial size was not well visualized. Pericardium: A small pericardial effusion is present. The pericardial effusion is circumferential. Mitral Valve: The mitral valve is abnormal. There is moderate thickening of the mitral valve leaflet(s). There is moderate calcification of the mitral valve leaflet(s). Moderate mitral annular calcification. Mild to moderate mitral valve regurgitation. Moderate to severe mitral valve stenosis. The mean mitral valve gradient is 10.0 mmHg. Tricuspid Valve: The tricuspid valve is not well visualized. Tricuspid valve regurgitation is mild to moderate. No evidence of tricuspid stenosis. Aortic Valve: The aortic valve is tricuspid. There is moderate calcification of the aortic valve. There is moderate thickening of the aortic valve. There is moderate aortic valve annular calcification. Aortic valve regurgitation is mild. Aortic regurgitation PHT measures 314 msec. Moderate aortic stenosis is present. Aortic valve mean gradient measures 27.6 mmHg. Aortic valve peak gradient measures 44.8 mmHg. Aortic valve area, by VTI measures 1.11 cm. Pulmonic Valve: The pulmonic valve was not well visualized. Pulmonic valve regurgitation is not visualized. No evidence of pulmonic stenosis. Aorta: The aortic root is normal in size and structure. Venous: The inferior vena cava is normal in size with less than 50% respiratory variability, suggesting right atrial pressure of 8 mmHg. IAS/Shunts: The interatrial septum was not well visualized.  LEFT VENTRICLE PLAX 2D LVIDd:         3.80 cm   Diastology LVIDs:         2.50 cm   LV e' medial:    4.45 cm/s LV PW:         1.30 cm   LV E/e' medial:  48.5 LV IVS:        1.20 cm   LV e' lateral:   5.35 cm/s LVOT diam:     1.90 cm   LV E/e' lateral: 40.4 LV SV:         82 LV SV Index:   46 LVOT Area:     2.84 cm  RIGHT VENTRICLE RV S prime:     11.20 cm/s TAPSE (M-mode): 1.6 cm LEFT ATRIUM             Index        RIGHT ATRIUM           Index LA diam:  5.10  cm 2.87 cm/m   RA Area:     14.60 cm LA Vol (A2C):   74.9 ml 42.14 ml/m  RA Volume:   34.40 ml  19.35 ml/m LA Vol (A4C):   78.0 ml 43.88 ml/m LA Biplane Vol: 75.8 ml 42.64 ml/m  AORTIC VALVE AV Area (Vmax):    1.17 cm AV Area (Vmean):   1.04 cm AV Area (VTI):     1.11 cm AV Vmax:           334.80 cm/s AV Vmean:          247.600 cm/s AV VTI:            0.740 m AV Peak Grad:      44.8 mmHg AV Mean Grad:      27.6 mmHg LVOT Vmax:         138.00 cm/s LVOT Vmean:        91.000 cm/s LVOT VTI:          0.290 m LVOT/AV VTI ratio: 0.39 AI PHT:            314 msec  AORTA Ao Root diam: 3.30 cm Ao Asc diam:  3.20 cm MITRAL VALVE MV Area (PHT): 3.65 cm     SHUNTS MV Mean grad:  10.0 mmHg    Systemic VTI:  0.29 m MV Decel Time: 208 msec     Systemic Diam: 1.90 cm MV E velocity: 216.00 cm/s MV A velocity: 145.00 cm/s MV E/A ratio:  1.49 Carlyle Dolly MD Electronically signed by Carlyle Dolly MD Signature Date/Time: 07/18/2021/3:50:11 PM    Final      Medications:     Current Medications:  atorvastatin  20 mg Oral Daily   levothyroxine  125 mcg Oral QAC breakfast   metoprolol succinate  25 mg Oral BID   pantoprazole  40 mg Oral BID   Riociguat  2.5 mg Oral TID   Selexipag  1,600 mcg Oral BID   Selexipag  800 mcg Oral BID   sodium bicarbonate  650 mg Oral TID   sodium chloride flush  3 mL Intravenous Q12H   sodium chloride flush  3 mL Intravenous Q12H    Infusions:     Patient Profile   Alisha Stopher is a 80 year old with history of   of diabetes, HTN, PAD, hyperlipidemia, IDA, DVT, OSA, 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   Admitted with AKI.  Assessment/Plan   AKI on CKD Stage IV Creatinine baseline ~ 1.8 Had C Diff on 10/5 ? Dehydration.  -Admit 4.3---> today 3.4 -Follow bmet daily   2. A/C Diastolic HF/ RV Failure  Echo - ->EF 75% Grade II DD, RV moderately reduced, MVR mod-severe Alisha, MVG 10  -Given IV fluids on admit due to AKI. Developed volume  overload. Given dose of IV lasix. I/O not accurate.  -BNP > 1700. Remains volume overloaded. Start 80 mg IV lasix twice a day.  -RHC to further assess hemodynamics.   3. PAH  Severe PAH with PVR 7 WU on 1/19 RHC.  Etiology uncertain.  She has a history of DVT remotely but V/Q scan in 2/19 did not show evidence for chronic PE. She has OSA and uses CPAP.  Serologic workup showed only positive ANA but when additional serologies were done, they were all negative (see above).  CT chest with no ILD or emphysema.  Valvular heart disease does not appear significant enough to  cause this degree of PH.  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  Echo in 4/19 showed that the RV is moderately dilated with mildly decreased systolic function, PA systolic pressure estimation was only 36 mmHg but poor envelope and likely underestimated. She did not tolerate Opsumit or ambrisentan due to significant lower extremity edema and worsening dyspnea.  [place] in 4/20 showed severe pulmonary hypertension with normal filling pressures and preserved cardiac output.  Echo in 4/21 showed normal RV with PASP 50 mmHg, echo 4/22 showed normal RV with PASP estimated 41 mmHg. TEE in 4/22 showed D-shaped septum with mild RV enlargement/mildly decreased RV systolic function. On combination therapy- UPTRAVI 2400 mcg + adempas 2.5 mg tid. Intolerant ambrisentan and opsumit.  RHC today to further assess.   4. Anemia  -H/O AVMs -Most recent transfusion was 11/3  -Hgb today 7.4  5. Mitral Valve Stenosis  - ECHO this admit with worsening mitral valve stenosis. MVR mod-severe Alisha, MVG 10  -Will need TEE later this week.  6. H/O C Diff 06/16/2021  On enteric precautions.   7.Aortic Stenosis  Moderate by TEE 12/2020   8. OSA Continue CPAP nightly   RHC today. The patient understands that risks included but are not limited to stroke (1 in 1000), death (1 in 43), kidney failure [usually temporary] (1 in 500), bleeding (1 in 200), allergic reaction [possibly  serious] (1 in 200).  The patient understands and agrees to proceed.    Length of Stay: 2  Darrick Grinder, NP  07/19/2021, 8:16 AM  Advanced Heart Failure Team Pager [phone] (M-F; 7a - 5p)  Please contact [facility] for night-coverage after hours (4p -7a ) and weekends on [email or web address]  Patient seen with NP, agree with the above note.    Complicated history as noted above.  Has had group 1 PAH, valvular heart disease (in the past, moderate AS and mild-moderate Alisha most recently assessed by TEE in 4/22), CKD stage 3, and GI bleeding from AVMs.   More recently, has had worsening renal dysfunction and torsemide/metolazone had been held since the end of October.  She became steadily more short of breath and was admitted.  Admission creatinine was 4.4 (prior baseline around 1.6), hgb has been trending down as well (7.4 today).   She received 1 dose of IV Lasix yesterday.  She remains short of breath and mildly tachypneic.  Echo done yesterday showed normal LV EF but is concerning for worsening valvular heart disease with at least moderate (versus mod-severe) aortic stenosis and moderate-severe mitral stenosis.  Creatinine down to 3.43 today.    General: NAD Neck: JVP 12-14 cm, no thyromegaly or thyroid nodule.  Lungs: Clear to auscultation bilaterally with normal respiratory effort. CV: Nondisplaced PMI.  Heart regular S1/S2, no S3/S4, 3/6 SEM RUSB with decreased S2.  No peripheral edema.  No carotid bruit.  Normal pedal pulses.  Abdomen: Soft, nontender, no hepatosplenomegaly, mild distention.  Skin: Intact without lesions or rashes.  Neurologic: Alert and oriented x 3.  Psych: Normal affect. Extremities: No clubbing or cyanosis.  HEENT: Normal.   Assessment/Plan: 1. Pulmonary hypertension with RV failure:  Severe PAH with PVR 7 WU on 1/19 RHC.  Etiology uncertain.  She has a history of DVT remotely but V/Q scan in 2/19 did not show evidence for chronic PE. She has OSA and uses CPAP.  Serologic  workup showed only positive ANA but when additional serologies were done, they were all negative (see above).  CT chest with no ILD or  emphysema.  Valvular heart disease does not appear significant enough to cause this degree of PH.  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  Echo in 4/19 showed that the RV is moderately dilated with mildly decreased systolic function, PA systolic pressure estimation was only 36 mmHg but poor envelope and likely underestimated. She did not tolerate Opsumit or ambrisentan due to significant lower extremity edema and worsening dyspnea.  [facility] in 4/20 showed severe pulmonary hypertension with normal filling pressures and preserved cardiac output.  Echo in 4/21 showed normal RV with PASP 50 mmHg, echo 4/22 showed normal RV with PASP estimated 41 mmHg. TEE in 4/22 showed D-shaped septum with mild RV enlargement/mildly decreased RV systolic function.  At baseline, has oxygen for exertion.  Echo this admission with D-shaped septum, moderate RVE and moderate RV dysfunction, also concerning for worsening valvular HD.  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   - Continue Adempas.  - Continue Uptravi 2400 mcg BID.   - RHC today to assess PA pressure and filling pressures.  Discussed risks/benefits with patient and she agrees to procedure.  2. Acute on chronic diastolic CHF: In setting of pulmonary hypertension and valvular disease.  Worsened symptoms with volume overload on exam.  Unfortunately, also appears to have developed progressive cardiorenal syndrome.  - Lasix 80 mg IV bid for now, RHC as above.  3. Syncope: Suspect related to severe pulmonary hypertension.  None recently.   4. Mitral stenosis/aortic stenosis: TEE in 4/22 showed mild-moderate MR and mild-moderate Alisha, possibly rheumatic.  With heavy calcification, I do not think that she would be a mitral valvuloplasty candidate.  AS also has been moderate in the past.  Echo this admission reviewed, I am worried for progressive  valvular heart disease leading to worsening volume status as well as worsening cardiorenal syndrome.  Echo this admission with moderate-severe aortic stenosis (mean gradient 34 mmHg with AVA around 1.0 cm^2) and moderate-severe mitral stenosis (mean gradient 10 mmHg).  - Once she is better diuresed, she will need TEE to assess the valves.  - Could potentially undergo TAVR for aortic valve but MV looks too calcified for valvuloplasty.  She would be a very high risk surgical patient.  Our options are likely going to be limited.  5. H/o DVT: No chronic PE on prior V/Q scan.   6. OSA: Continue CPAP qHS.  7. AKI on CKD Stage 3: Prior baseline creatinine has been around 1.6, recently with significant increase.  Admitted with creatinine at 4.4, now down to 3.43.  Concern for progressive cardiorenal syndrome possibly with worsening valvular HD.  8. H/o GI bleeding: Recurrent.  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 Hgb down to 7.4.  - With CHF/volume overload currently, would transfuse hgb < 7.  - Would involve GI, likely will need repeat scope when volume status improved.   Loralie Champagne 07/19/2021 9:43 AM

## 2021-07-19 NOTE — Progress Notes (Addendum)
[facility] PROGRESS NOTE    Progress Note  Alisha [name]  OZH:[id number] DOB: Mar 29, 1941 DOA: 07/16/2021 PCP: Mackie Pai, PA-C     Brief Narrative:   Alisha [name] is an 80 y.o. 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  She relates that she had her diuretic recently increased as she was becoming short of breath and requiring oxygen.  Upon arrival to the ED she was satting in the low 90s blood pressure 117/58 bicarb of 13 creatinine of 4 with a BUN of 97 (with a baseline creatinine of 1.3-1.5)  Significant studies: RHC in 4/20 showed normal filling pressures but severe pulmonary hypertension with PVR 6 WU.  Echo showed moderately dilated RV with normal systolic function, mild-moderate MS, mild AS.  TEE was done in 4/22 to more closely assess the mitral and aortic valves, EF 60-65%, d-shaped septum, mild RV enlargement with mildly decreased RV systolic function, peak RV-RA gradient 38 mmHg, moderate AS with AVA 1.31 cm^2 mean gradient 26 mmHg, mild-moderate MR with mild-moderate MS mean gradient 7 with MVA 1.7 cm^2 by VTI .   Assessment/Plan:   Acute renal failure superimposed on stage 4 chronic kidney disease/anion gap metabolic acidosis: Likely prerenal history  in the setting of torsemide and metolazone use. This was held she was given gentle IV fluid hydration.  Creatinine improved to 3.4 Chest x-ray shows pulmonary edema, with a BNP of 1700 Cardiology was consulted who recommended a right heart cath.  Pulmonary hypertension probable  groupwith RV failure/chronic heart failure: Continue to hold her diuretics,held since 07/09/2021 by  her nephrologist,. Basic metabolic panels pending. Now in pulmonary edema, cardiology was consulted recommended a right heart  cath. Will have to get palliative care involved over the next several days.  Normocytic anemia in the setting of chronic GI blood loss: Attributed to AVMs had received 1 packed red blood cells on 07/15/2021. 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 We will go ahead and discuss with cardiology to see if we need to get GI involved after right heart cath. Not a good candidate for anticoagulation  Essential hypertension: Holding antihypertensive medication as her blood pressure was borderline low on admission she was given half a liter of normal saline was started on gentle hydration.  Prolonged QTC: Monitor on telemetry mag level pending.  History of mitral valve stenosis: 2D echo as above.  Moderate aortic stenosis:  2D echo as above  DVT prophylaxis: lovenox Family Communication:none Status is: Inpatient  Remains inpatient appropriate because: Acute kidney injury.    Code Status:     Code Status Orders  (From admission, onward)           Start     Ordered   07/17/21 0622  Full code  Continuous        07/17/21 0622           Code Status History     Date Active Date Inactive Code Status Order ID Comments User Context   01/01/2019 1525 01/01/2019 2032 Full Code [id number]  Larey Dresser, MD Inpatient   09/27/2017 1635 09/27/2017 2217 Full Code [id number]  Sherren Mocha, MD Inpatient         IV Access:   Peripheral IV   Procedures and diagnostic studies:   DG CHEST  PORT 1 VIEW  Result Date: 07/18/2021 CLINICAL DATA:  Dyspnea EXAM: PORTABLE CHEST 1 VIEW COMPARISON:  July 16, 2021 FINDINGS: Enlarged cardiac silhouette. Tortuosity and calcific atherosclerotic disease of the aorta. Streaky bilateral airspace opacities with lower lobe predominance. Possible bilateral pleural effusions, small. Osseous structures are without acute abnormality. Soft tissues are grossly normal. IMPRESSION: 1. Streaky bilateral airspace  opacities and increased interstitial markings may represent pulmonary edema, with lower lobe atelectasis and small pleural effusions. 2. Enlarged cardiac silhouette. Electronically Signed   By: Fidela Salisbury M.D.   On: 07/18/2021 12:12   ECHOCARDIOGRAM COMPLETE  Result Date: 07/18/2021    ECHOCARDIOGRAM REPORT   Patient Name:   Alisha [name] [facility] Date of Exam: 07/18/2021 Medical Rec #:  [id number]      Height:       65.0 in Accession #:    [id number]     Weight:       155.5 lb Date of Birth:  [birth year]     BSA:          1.777 m Patient Age:    13 years       BP:           135/69 mmHg Patient Gender: F              HR:           95 bpm. Exam Location:  Inpatient Procedure: 2D Echo, Color Doppler and Cardiac Doppler Indications:    Dyspnea  History:        Patient has prior history of Echocardiogram examinations, most                 recent 01/07/2021. CHF, CAD, Pulmonary HTN; Risk                 Factors:Diabetes.  Sonographer:    Bernadene Person RDCS Referring Phys: [place]  1. Left ventricular ejection fraction, by estimation, is >75%. The left ventricle has hyperdynamic function. The left ventricle has no regional wall motion abnormalities. There is moderate left ventricular hypertrophy. Left ventricular diastolic parameters are consistent with Grade II diastolic dysfunction (pseudonormalization). Elevated left atrial pressure.  2. The ventricular septum is flattened in systole and diastole consistent with RV pressure and volume overload. Relatively preserved RV anular vertical motion however decreased free wall contraction in the horizontal plane, TAPSE and S' likely poor indicators for RV function. . Right ventricular systolic function is moderately reduced. The right ventricular size is moderately enlarged. Tricuspid regurgitation signal is inadequate for assessing PA pressure.  3. Left atrial size was moderately dilated.  4. A small pericardial effusion is present. The  pericardial effusion is circumferential.  5. The mitral valve is abnormal. Mild to moderate mitral valve regurgitation. Moderate to severe mitral stenosis. Moderate mitral annular calcification. The mean mitral valve gradient is 10.0 mmHg.  6. Tricuspid valve regurgitation is mild to moderate.  7. The aortic valve is tricuspid. There is moderate calcification of the aortic valve. There is moderate thickening of the aortic valve. Aortic valve regurgitation is mild. Moderate aortic valve stenosis.  8. The inferior vena cava is normal in size with <50% respiratory variability, suggesting right atrial pressure of 8 mmHg. FINDINGS  Left Ventricle: Left ventricular ejection fraction, by estimation, is >75%. The left ventricle has hyperdynamic function. The left ventricle has no regional wall motion abnormalities. The left ventricular internal cavity size was normal in size. There is moderate left ventricular hypertrophy. Left ventricular diastolic  parameters are consistent with Grade II diastolic dysfunction (pseudonormalization). Elevated left atrial pressure. Right Ventricle: The ventricular septum is flattened in systole and diastole consistent with RV pressure and volume overload. Relatively preserved RV anular vertical motion however decreased free wall contraction in the horizontal plane, TAPSE and S' likely poor indicators for RV function. The right ventricular size is moderately enlarged. Right vetricular wall thickness was not well visualized. Right ventricular systolic function is moderately reduced. Tricuspid regurgitation signal is inadequate for assessing PA pressure. Left Atrium: Left atrial size was moderately dilated. Right Atrium: Right atrial size was not well visualized. Pericardium: A small pericardial effusion is present. The pericardial effusion is circumferential. Mitral Valve: The mitral valve is abnormal. There is moderate thickening of the mitral valve leaflet(s). There is moderate calcification  of the mitral valve leaflet(s). Moderate mitral annular calcification. Mild to moderate mitral valve regurgitation. Moderate to severe mitral valve stenosis. The mean mitral valve gradient is 10.0 mmHg. Tricuspid Valve: The tricuspid valve is not well visualized. Tricuspid valve regurgitation is mild to moderate. No evidence of tricuspid stenosis. Aortic Valve: The aortic valve is tricuspid. There is moderate calcification of the aortic valve. There is moderate thickening of the aortic valve. There is moderate aortic valve annular calcification. Aortic valve regurgitation is mild. Aortic regurgitation PHT measures 314 msec. Moderate aortic stenosis is present. Aortic valve mean gradient measures 27.6 mmHg. Aortic valve peak gradient measures 44.8 mmHg. Aortic valve area, by VTI measures 1.11 cm. Pulmonic Valve: The pulmonic valve was not well visualized. Pulmonic valve regurgitation is not visualized. No evidence of pulmonic stenosis. Aorta: The aortic root is normal in size and structure. Venous: The inferior vena cava is normal in size with less than 50% respiratory variability, suggesting right atrial pressure of 8 mmHg. IAS/Shunts: The interatrial septum was not well visualized.  LEFT VENTRICLE PLAX 2D LVIDd:         3.80 cm   Diastology LVIDs:         2.50 cm   LV e' medial:    4.45 cm/s LV PW:         1.30 cm   LV E/e' medial:  48.5 LV IVS:        1.20 cm   LV e' lateral:   5.35 cm/s LVOT diam:     1.90 cm   LV E/e' lateral: 40.4 LV SV:         82 LV SV Index:   46 LVOT Area:     2.84 cm  RIGHT VENTRICLE RV S prime:     11.20 cm/s TAPSE (M-mode): 1.6 cm LEFT ATRIUM             Index        RIGHT ATRIUM           Index LA diam:        5.10 cm 2.87 cm/m   RA Area:     14.60 cm LA Vol (A2C):   74.9 ml 42.14 ml/m  RA Volume:   34.40 ml  19.35 ml/m LA Vol (A4C):   78.0 ml 43.88 ml/m LA Biplane Vol: 75.8 ml 42.64 ml/m  AORTIC VALVE AV Area (Vmax):    1.17 cm AV Area (Vmean):   1.04 cm AV Area (VTI):      1.11 cm AV Vmax:           334.80 cm/s AV Vmean:          247.600 cm/s AV VTI:  0.740 m AV Peak Grad:      44.8 mmHg AV Mean Grad:      27.6 mmHg LVOT Vmax:         138.00 cm/s LVOT Vmean:        91.000 cm/s LVOT VTI:          0.290 m LVOT/AV VTI ratio: 0.39 AI PHT:            314 msec  AORTA Ao Root diam: 3.30 cm Ao Asc diam:  3.20 cm MITRAL VALVE MV Area (PHT): 3.65 cm     SHUNTS MV Mean grad:  10.0 mmHg    Systemic VTI:  0.29 m MV Decel Time: 208 msec     Systemic Diam: 1.90 cm MV E velocity: 216.00 cm/s MV A velocity: 145.00 cm/s MV E/A ratio:  1.49 Carlyle Dolly MD Electronically signed by Carlyle Dolly MD Signature Date/Time: 07/18/2021/3:50:11 PM    Final      Medical Consultants:   None.   Subjective:    Alisha [name] relates her breathing is better today compared to yesterday.  Objective:    Vitals:   07/18/21 2020 07/19/21 0310 07/19/21 0313 07/19/21 0314  BP: (!) 119/57   130/63  Pulse: 91  84 93  Resp: 19  19 19   Temp: 97.9 F (36.6 C)  98.3 F (36.8 C)   TempSrc: Oral  Oral   SpO2: 95%   96%  Weight:  70.6 kg    Height:       SpO2: 96 % O2 Flow Rate (L/min): 4 L/min   Intake/Output Summary (Last 24 hours) at 07/19/2021 0910 Last data filed at 07/19/2021 0318 Gross per 24 hour  Intake 480 ml  Output --  Net 480 ml    Filed Weights   07/17/21 0843 07/18/21 0352 07/19/21 0310  Weight: 70.3 kg 70.5 kg 70.6 kg    Exam: General exam: In no acute distress. Respiratory system: Good air movement and crackles bilaterally Cardiovascular system: S1 & S2 heard, RRR.  Positive JVD Gastrointestinal system: Abdomen is nondistended, soft and nontender.  Extremities: No pedal edema. Skin: No rashes, lesions or ulcers Psychiatry: Judgement and insight appear normal. Mood & affect appropriate.  Data Reviewed:    Labs: Basic Metabolic Panel: Recent Labs  Lab 07/16/21 2004 07/17/21 0700 07/17/21 1114 07/18/21 0342 07/19/21 0103  NA 134* 136 136  137 136  K 3.8 3.6 3.6 3.7 3.3*  CL 106 108 108 111 109  CO2 13* 16* 15* 14* 18*  GLUCOSE 146* 145* 225* 131* 141*  BUN 97* 95* 93* 89* 89*  CREATININE 4.30* 4.01* 4.05* 3.78* 3.43*  CALCIUM 8.4* 8.0* 8.0* 8.3* 8.2*  MG  --   --   --  1.9  --     GFR Estimated Creatinine Clearance: 13.3 mL/min (A) (by C-G formula based on SCr of 3.43 mg/dL (H)). Liver Function Tests: Recent Labs  Lab 07/16/21 2004  AST 12*  ALT 9  ALKPHOS 54  BILITOT 0.6  PROT 6.4*  ALBUMIN 3.4*    No results for input(s): LIPASE, AMYLASE in the last 168 hours. No results for input(s): AMMONIA in the last 168 hours. Coagulation profile No results for input(s): INR, PROTIME in the last 168 hours. COVID-19 Labs  No results for input(s): DDIMER, FERRITIN, LDH, CRP in the last 72 hours.  Lab Results  Component Value Date   SARSCOV2NAA NEGATIVE 07/17/2021   [place] NEGATIVE 01/05/2021    CBC: Recent Labs  Lab 07/16/21 2004 07/18/21 0727 07/19/21 0103  WBC 8.6 6.6 7.7  NEUTROABS 7.2  --   --   HGB 8.8* 7.8* 7.4*  HCT 28.0* 24.5* 23.5*  MCV 86.2 85.1 85.8  PLT 339 296 287    Cardiac Enzymes: No results for input(s): CKTOTAL, CKMB, CKMBINDEX, TROPONINI in the last 168 hours. BNP (last 3 results) Recent Labs    01/26/21 1313  PROBNP 823.0*    CBG: No results for input(s): GLUCAP in the last 168 hours. D-Dimer: No results for input(s): DDIMER in the last 72 hours. Hgb A1c: No results for input(s): HGBA1C in the last 72 hours. Lipid Profile: No results for input(s): CHOL, HDL, LDLCALC, TRIG, CHOLHDL, LDLDIRECT in the last 72 hours. Thyroid function studies: No results for input(s): TSH, T4TOTAL, T3FREE, THYROIDAB in the last 72 hours.  Invalid input(s): FREET3 Anemia work up: No results for input(s): VITAMINB12, FOLATE, FERRITIN, TIBC, IRON, RETICCTPCT in the last 72 hours. Sepsis Labs: Recent Labs  Lab 07/16/21 2004 07/18/21 0727 07/19/21 0103  WBC 8.6 6.6 7.7     Microbiology Recent Results (from the past 240 hour(s))  Resp Panel by RT-PCR (Flu A&B, Covid) Nasopharyngeal Swab     Status: None   Collection Time: 07/17/21  3:05 AM   Specimen: Nasopharyngeal Swab; Nasopharyngeal(NP) swabs in vial transport medium  Result Value Ref Range Status   SARS Coronavirus 2 by RT PCR NEGATIVE NEGATIVE Final    Comment: (NOTE) SARS-CoV-2 target nucleic acids are NOT DETECTED.  The SARS-CoV-2 RNA is generally detectable in upper respiratory specimens during the acute phase of infection. The lowest concentration of SARS-CoV-2 viral copies this assay can detect is 138 copies/mL. A negative result does not preclude SARS-Cov-2 infection and should not be used as the sole basis for treatment or other patient management decisions. A negative result may occur with  improper specimen collection/handling, submission of specimen other than nasopharyngeal swab, presence of viral mutation(s) within the areas targeted by this assay, and inadequate number of viral copies(<138 copies/mL). A negative result must be combined with clinical observations, patient history, and epidemiological information. The expected result is Negative.  Fact Sheet for Patients:  EntrepreneurPulse.com.au  Fact Sheet for Healthcare Providers:  IncredibleEmployment.be  This test is no t yet approved or cleared by the [place] FDA and  has been authorized for detection and/or diagnosis of SARS-CoV-2 by FDA under an Emergency Use Authorization (EUA). This EUA will remain  in effect (meaning this test can be used) for the duration of the COVID-19 declaration under Section 564(b)(1) of the Act, 21 U.S.C.section 360bbb-3(b)(1), unless the authorization is terminated  or revoked sooner.       Influenza A by PCR NEGATIVE NEGATIVE Final   Influenza B by PCR NEGATIVE NEGATIVE Final    Comment: (NOTE) The Xpert Xpress SARS-CoV-2/FLU/RSV plus assay is  intended as an aid in the diagnosis of influenza from Nasopharyngeal swab specimens and should not be used as a sole basis for treatment. Nasal washings and aspirates are unacceptable for Xpert Xpress SARS-CoV-2/FLU/RSV testing.  Fact Sheet for Patients: EntrepreneurPulse.com.au  Fact Sheet for Healthcare Providers: IncredibleEmployment.be  This test is not yet approved or cleared by the [place] FDA and has been authorized for detection and/or diagnosis of SARS-CoV-2 by FDA under an Emergency Use Authorization (EUA). This EUA will remain in effect (meaning this test can be used) for the duration of the COVID-19 declaration under Section 564(b)(1) of the Act, 21 U.S.C. section 360bbb-3(b)(1), unless the  authorization is terminated or revoked.  Performed at [facility], [place] [address] [place], [place] [address]      Medications:    atorvastatin  20 mg Oral Daily   furosemide  80 mg Intravenous BID   levothyroxine  125 mcg Oral QAC breakfast   metoprolol succinate  25 mg Oral BID   pantoprazole  40 mg Oral BID   potassium chloride  40 mEq Oral Q8H   Riociguat  2.5 mg Oral TID   Selexipag  1,600 mcg Oral BID   Selexipag  800 mcg Oral BID   sodium bicarbonate  650 mg Oral TID   sodium chloride flush  3 mL Intravenous Q12H   sodium chloride flush  3 mL Intravenous Q12H   Continuous Infusions:    LOS: 2 days   Charlynne Cousins  [facility]  07/19/2021, 9:10 AM

## 2021-07-19 NOTE — Interval H&P Note (Signed)
History and Physical Interval Note:  07/19/2021 1:58 PM  Alisha [name]  has presented today for surgery, with the diagnosis of heart failure.  The various methods of treatment have been discussed with the patient and family. After consideration of risks, benefits and other options for treatment, the patient has consented to  Procedure(s): RIGHT HEART CATH (N/A) as a surgical intervention.  The patient's history has been reviewed, patient examined, no change in status, stable for surgery.  I have reviewed the patient's chart and labs.  Questions were answered to the patient's satisfaction.     Quin Mathenia [employer]

## 2021-07-20 ENCOUNTER — Encounter (HOSPITAL_COMMUNITY): Payer: Self-pay | Admitting: Cardiology

## 2021-07-20 DIAGNOSIS — I5032 Chronic diastolic (congestive) heart failure: Secondary | ICD-10-CM | POA: Diagnosis not present

## 2021-07-20 DIAGNOSIS — N179 Acute kidney failure, unspecified: Secondary | ICD-10-CM | POA: Diagnosis not present

## 2021-07-20 DIAGNOSIS — I272 Pulmonary hypertension, unspecified: Secondary | ICD-10-CM | POA: Diagnosis not present

## 2021-07-20 DIAGNOSIS — K5520 Angiodysplasia of colon without hemorrhage: Secondary | ICD-10-CM | POA: Diagnosis not present

## 2021-07-20 LAB — BASIC METABOLIC PANEL
Anion gap: 13 (ref 5–15)
BUN: 83 mg/dL — ABNORMAL HIGH (ref 8–23)
CO2: 17 mmol/L — ABNORMAL LOW (ref 22–32)
Calcium: 8.5 mg/dL — ABNORMAL LOW (ref 8.9–10.3)
Chloride: 108 mmol/L (ref 98–111)
Creatinine, Ser: 3.45 mg/dL — ABNORMAL HIGH (ref 0.44–1.00)
GFR, Estimated: 13 mL/min — ABNORMAL LOW (ref >60)
Glucose, Bld: 137 mg/dL — ABNORMAL HIGH (ref 70–99)
Potassium: 3.7 mmol/L (ref 3.5–5.1)
Sodium: 138 mmol/L (ref 135–145)

## 2021-07-20 LAB — CBC
HCT: 26.5 % — ABNORMAL LOW (ref 36.0–46.0)
Hemoglobin: 8.1 g/dL — ABNORMAL LOW (ref 12.0–15.0)
MCH: 26.6 pg (ref 26.0–34.0)
MCHC: 30.6 g/dL (ref 30.0–36.0)
MCV: 87.2 fL (ref 80.0–100.0)
Platelets: 345 10*3/uL (ref 150–400)
RBC: 3.04 MIL/uL — ABNORMAL LOW (ref 3.87–5.11)
RDW: 19.5 % — ABNORMAL HIGH (ref 11.5–15.5)
WBC: 8.6 10*3/uL (ref 4.0–10.5)
nRBC: 0 % (ref 0.0–0.2)

## 2021-07-20 LAB — MAGNESIUM: Magnesium: 2.1 mg/dL (ref 1.7–2.4)

## 2021-07-20 LAB — IRON AND TIBC
Iron: 27 ug/dL — ABNORMAL LOW (ref 28–170)
Saturation Ratios: 14 % (ref 10.4–31.8)
TIBC: 189 ug/dL — ABNORMAL LOW (ref 250–450)
UIBC: 162 ug/dL

## 2021-07-20 LAB — FERRITIN: Ferritin: 60 ng/mL (ref 11–307)

## 2021-07-20 MED ORDER — METOLAZONE 2.5 MG PO TABS
2.5000 mg | ORAL_TABLET | Freq: Once | ORAL | Status: AC
  Administered 2021-07-20: 2.5 mg via ORAL
  Filled 2021-07-20: qty 1

## 2021-07-20 MED ORDER — SODIUM CHLORIDE 0.9 % IV SOLN: INTRAVENOUS | Status: DC

## 2021-07-20 NOTE — Progress Notes (Addendum)
Advanced Heart Failure Rounding Note  PCP-Cardiologist: Dr. Aundra Dubin   Subjective:    Reports good subjective response to IV Lasix but only 1L in UOP charted yesterday. ? If accurate. Wt down 2 lb. Feels better today. Slept extremely well last night. No dyspnea.   SCr stable, 3.43>>3.45  Hgb 7.5>>8.1   RHC Procedural Findings: Hemodynamics (mmHg) RA mean 10 RV 86/14 PA 90/29, mean 53 PCWP mean 20  Oxygen saturations: PA 61% AO 95%  Cardiac Output (Fick) 6.93  Cardiac Index (Fick) 3.89 PVR 4.8 WU   Objective:   Weight Range: 69.8 kg Body mass index is 25.61 kg/m.   Vital Signs:   Temp:  [98.2 F (36.8 C)-98.5 F (36.9 C)] 98.5 F (36.9 C) (11/08 0409) Pulse Rate:  [70-139] 70 (11/08 0409) Resp:  [0-24] 20 (11/08 0409) BP: (115-129)/(52-64) 116/54 (11/08 0409) SpO2:  [0 %-100 %] 96 % (11/08 0409) Weight:  [69.8 kg] 69.8 kg (11/08 0409) Last BM Date: 07/19/21  Weight change: Filed Weights   07/18/21 0352 07/19/21 0310 07/20/21 0409  Weight: 70.5 kg 70.6 kg 69.8 kg    Intake/Output:   Intake/Output Summary (Last 24 hours) at 07/20/2021 0804 Last data filed at 07/20/2021 0423 Gross per 24 hour  Intake 240 ml  Output 1000 ml  Net -760 ml      Physical Exam    General:  Well appearing. No resp difficulty HEENT: Normal Neck: Supple. JVD 10 cm . Carotids 2+ bilat; no bruits. No lymphadenopathy or thyromegaly appreciated. Cor: PMI nondisplaced. Regular rate & rhythm. 3/6 SEM throughout the precordium, loudest LSB Lungs: Clear Abdomen: Soft, nontender, nondistended. No hepatosplenomegaly. No bruits or masses. Good bowel sounds. Extremities: No cyanosis, clubbing, rash, edema Neuro: Alert & orientedx3, cranial nerves grossly intact. moves all 4 extremities w/o difficulty. Affect pleasant   Telemetry   NSR 60s, personally reviewed   EKG    No new EKG to review   Labs    CBC Recent Labs    07/19/21 0103 07/19/21 1513 07/20/21 0428  WBC  7.7  --  8.6  HGB 7.4* 11.2*  7.5* 8.1*  HCT 23.5* 33.0*  22.0* 26.5*  MCV 85.8  --  87.2  PLT 287  --  606   Basic Metabolic Panel Recent Labs    07/18/21 0342 07/19/21 0103 07/19/21 1513 07/20/21 0428  NA 137 136 139  140 138  K 3.7 3.3* 3.6  3.5 3.7  CL 111 109  --  108  CO2 14* 18*  --  17*  GLUCOSE 131* 141*  --  137*  BUN 89* 89*  --  83*  CREATININE 3.78* 3.43*  --  3.45*  CALCIUM 8.3* 8.2*  --  8.5*  MG 1.9  --   --  2.1   Liver Function Tests No results for input(s): AST, ALT, ALKPHOS, BILITOT, PROT, ALBUMIN in the last 72 hours. No results for input(s): LIPASE, AMYLASE in the last 72 hours. Cardiac Enzymes No results for input(s): CKTOTAL, CKMB, CKMBINDEX, TROPONINI in the last 72 hours.  BNP: BNP (last 3 results) Recent Labs    06/18/21 1225 07/17/21 0336 07/19/21 0103  BNP 986.2* 1,247.8* 1,758.6*    ProBNP (last 3 results) Recent Labs    01/26/21 1313  PROBNP 823.0*     D-Dimer No results for input(s): DDIMER in the last 72 hours. Hemoglobin A1C No results for input(s): HGBA1C in the last 72 hours. Fasting Lipid Panel No results for input(s): CHOL, HDL,  LDLCALC, TRIG, CHOLHDL, LDLDIRECT in the last 72 hours. Thyroid Function Tests No results for input(s): TSH, T4TOTAL, T3FREE, THYROIDAB in the last 72 hours.  Invalid input(s): FREET3  Other results:   Imaging    CARDIAC CATHETERIZATION  Result Date: 07/19/2021 1. Mild to moderately elevated right and left heart filling pressures. 2. Preserved cardiac output. 3. Severe primarily pulmonary arterial hypertension. 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     Medications:     Scheduled Medications:  atorvastatin  20 mg Oral Daily   furosemide  80 mg Intravenous BID   levothyroxine  125 mcg Oral QAC breakfast   metoprolol succinate  25 mg Oral BID   pantoprazole  40 mg Oral BID   Riociguat  2.5 mg Oral TID   Selexipag  1,600 mcg Oral BID   Selexipag  800 mcg  Oral BID   sodium bicarbonate  650 mg Oral TID   sodium chloride flush  3 mL Intravenous Q12H   sodium chloride flush  3 mL Intravenous Q12H   sodium chloride flush  3 mL Intravenous Q12H    Infusions:  sodium chloride      PRN Medications: sodium chloride, acetaminophen, ondansetron (ZOFRAN) IV, sodium chloride flush   Assessment/Plan   Assessment/Plan: 1. Pulmonary hypertension with RV failure:  Severe PAH with PVR 7 WU on 1/19 RHC.  Etiology uncertain.  She has a history of DVT remotely but V/Q scan in 2/19 did not show evidence for chronic PE. She has OSA and uses CPAP.  Serologic workup showed only positive ANA but when additional serologies were done, they were all negative.  CT chest with no ILD or emphysema.  Valvular heart disease does not appear significant enough to cause this degree of PH.  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  Echo in 4/19 showed that the RV is moderately dilated with mildly decreased systolic function, PA systolic pressure estimation was only 36 mmHg but poor envelope and likely underestimated. She did not tolerate Opsumit or ambrisentan due to significant lower extremity edema and worsening dyspnea.  [place] in 4/20 showed severe pulmonary hypertension with normal filling pressures and preserved cardiac output.  Echo in 4/21 showed normal RV with PASP 50 mmHg, echo 4/22 showed normal RV with PASP estimated 41 mmHg. TEE in 4/22 showed D-shaped septum with mild RV enlargement/mildly decreased RV systolic function.  At baseline, has oxygen for exertion.  Echo this admission with D-shaped septum, moderate RVE and moderate RV dysfunction, also concerning for worsening valvular HD.  RHC this admit showed mild-moderately elevated right and left filling pressures, preserved cardiac output and severe primarily pulmonary arterial hypertension. PVR 4.8 WU. 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  - Continue Adempas 2.5 mg tid.  - Continue Uptravi 2400 mcg BID.   2. Acute on chronic  diastolic CHF: In setting of pulmonary hypertension and valvular disease.  Worsened symptoms with volume overload on exam.  Unfortunately, also appears to have developed progressive cardiorenal syndrome. RHC showed mild-moderately elevated right and left filling pressures, preserved cardiac output, mRA 10, mPCWP, CI 3.89  - continue IV Lasix 80 mg bid  3. Syncope: Suspect related to severe pulmonary hypertension.  None recently.   4. 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  With heavy calcification, I do not think that she would be a mitral valvuloplasty candidate.  AS also has been moderate in the past.  Echo this admission reviewed,  I am worried for progressive valvular heart disease leading to worsening volume status as well as worsening cardiorenal syndrome.  Echo this admission with moderate-severe aortic stenosis (mean gradient 34 mmHg with AVA around 1.0 cm^2) and moderate-severe mitral stenosis (mean gradient 10 mmHg).  - Once she is better diuresed, she will need TEE to assess the valves. Likely tomorrow  - Could potentially undergo TAVR for aortic valve but MV looks too calcified for valvuloplasty.  She would be a very high risk surgical patient.  Our options are likely going to be limited.  5. H/o DVT: No chronic PE on prior V/Q scan.   6. OSA: Continue CPAP qHS.  7. AKI on CKD Stage 3: Prior baseline creatinine has been around 1.6, recently with significant increase.  Admitted with creatinine at 4.4, now down to 3.45.  Concern for progressive cardiorenal syndrome possibly with worsening valvular HD.  8. H/o GI bleeding: Recurrent.  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 Hgb down to 7.4 this admit. Trending up, 8.1 today  - With CHF/volume overload currently, would transfuse hgb < 7.  - Would involve GI, had deep endoscopy in 10/22 without source of bleeding found.    Length of Stay: [address], PA-C   07/20/2021, 8:04 AM  Advanced Heart Failure Team Pager [phone] (M-F; 7a - 5p)  Please contact [facility] for night-coverage after hours (5p -7a ) and weekends on [email or web address]  Patient seen with PA, agree with the above note.   Filling pressures elevated on RHC yesterday but not markedly elevated.  Still with severe PAH.    Some diuresis overnight with weight down 2 lbs though not reflected in I/Os.  Creatinine stable at 3.45.    Still short of breath walking to bathroom.   Hgb higher at 8.1.   General: NAD Neck: JVP 12 cm, no thyromegaly or thyroid nodule.  Lungs: Clear to auscultation bilaterally with normal respiratory effort. CV: Nondisplaced PMI.  Heart regular S1/S2, no S3/S4, 3/6 SEM RUSB with clear S2.  No peripheral edema.  Abdomen: Soft, nontender, no hepatosplenomegaly, no distention.  Skin: Intact without lesions or rashes.  Neurologic: Alert and oriented x 3.  Psych: Normal affect. Extremities: No clubbing or cyanosis.  HEENT: Normal.   Still with volume overload on exam, creatinine elevated but stable.  Continue Lasix 80 mg IV bid, will give metolazone 2.5 with pm Lasix.  Follow creatinine closely.    Still with severe PAH, continue Adempas and Uptravi.    Hgb 8.1, would not transfuse unless hgb < 7.5.  She had deep endoscopy last month without bleeding source found, not sure that repeat study is going to be helpful.  Check Fe studies, give feraheme if low .  Difficult situation.  I am concerned that worsening valvular heart disease (moderate-severe AS and moderate-severe MS on echo this admission) may have triggered worsening cardiorenal syndrome and volume overload.  Poor candidate for surgical valve replacement with chronic anemia, severe pulmonary hypertension and advanced renal failure.  - I will arrange for TEE tomorrow to more carefully assess valves.  Discussed risks/benefits with patient and she agrees to study.   - If aortic valve looks like it is the  main problem, TAVR may be an option.  However, I do not think that she would be a mitral valvuloplasty candidate (would have to be surgical replacement).  Loralie Champagne 07/20/2021 12:21 PM

## 2021-07-20 NOTE — Progress Notes (Signed)
[facility] PROGRESS NOTE    Progress Note  Alisha [name]  UEA:[id number] DOB: 09/26/1940 DOA: 07/16/2021 PCP: Mackie Pai, PA-C     Brief Narrative:   Alisha [name] is an 80 y.o. 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  She relates that she had her diuretic recently increased as she was becoming short of breath and requiring oxygen.  Upon arrival to the ED she was satting in the low 90s blood pressure 117/58 bicarb of 13 creatinine of 4 with a BUN of 97 (with a baseline creatinine of 1.3-1.5)  Significant studies: RHC in 4/20 showed normal filling pressures but severe pulmonary hypertension with PVR 6 WU.  Echo showed moderately dilated RV with normal systolic function, mild-moderate MS, mild AS.  Right heart cath on 07/19/2021 showed elevated right and left heart filling pressure preserved cardiac output severe primary arterial hypertension  TEE 07/19/2021 pending  TEE was done in 4/22 to more closely assess the mitral and aortic valves, EF 60-65%, d-shaped septum, mild RV enlargement with mildly decreased RV systolic function, peak RV-RA gradient 38 mmHg, moderate AS with AVA 1.31 cm^2 mean gradient 26 mmHg, mild-moderate MR with mild-moderate MS mean gradient 7 with MVA 1.7 cm^2 by VTI .   Assessment/Plan:   Acute renal failure superimposed on stage 4 chronic kidney disease/anion gap metabolic acidosis: Likely prerenal history  in the setting of torsemide and metolazone use. This was held she was given gentle IV fluid hydration.  Creatinine improved to 3.4, unfortunately she developed pulmonary edema seen on chest x-ray and BNP of 1700. Cardiology was consulted recommended right heart cath with results as below  Pulmonary hypertension probable  groupwith RV  failure/chronic heart failure: Continue to hold her diuretics,held since 07/09/2021 by  her nephrologist,. Continue Adempas 2.5 mg tid.  Continue Uptravi 2400 mcg BID Unfortunately right heart cath showed elevated right and left filling pressures with preserved cardiac output. Cardiology recommended to continue IV Lasix.  TEE scheduled for today 07/19/2021  Normocytic anemia in the setting of chronic GI blood loss: Attributed to AVMs had received 1 packed red blood cells on 07/15/2021. 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 We will go ahead and discuss with cardiology to see if we need to get GI involved after right heart cath. Not a good candidate for anticoagulation  Essential hypertension: Holding antihypertensive medication as her blood pressure was borderline low on admission she was given half a liter of normal saline was started on gentle hydration.  Prolonged QTC: Monitor on telemetry mag level pending.  History of mitral valve stenosis: 2D echo as above.  Moderate aortic stenosis:  2D echo as above  DVT prophylaxis: lovenox Family Communication:none Status is: Inpatient  Remains inpatient appropriate because: Acute kidney injury.    Code Status:     Code Status Orders  (From admission, onward)           Start     Ordered   07/17/21 0622  Full code  Continuous        07/17/21 0622           Code Status History     Date Active Date Inactive Code Status Order ID Comments User Context   01/01/2019 1525 01/01/2019 2032 Full Code [id number]  Larey Dresser,  MD Inpatient   09/27/2017 1635 09/27/2017 2217 Full Code [id number]  Sherren Mocha, MD Inpatient         IV Access:   Peripheral IV   Procedures and diagnostic studies:   CARDIAC CATHETERIZATION  Result Date: 07/19/2021 1. Mild to moderately elevated right and left heart filling pressures. 2. Preserved cardiac output. 3. Severe primarily pulmonary  arterial hypertension. 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   ECHOCARDIOGRAM COMPLETE  Result Date: 07/18/2021    ECHOCARDIOGRAM REPORT   Patient Name:   Alisha [name] [facility] Date of Exam: 07/18/2021 Medical Rec #:  [id number]      Height:       65.0 in Accession #:    [id number]     Weight:       155.5 lb Date of Birth:  March 13, 1941     BSA:          1.777 m Patient Age:    39 years       BP:           135/69 mmHg Patient Gender: F              HR:           95 bpm. Exam Location:  Inpatient Procedure: 2D Echo, Color Doppler and Cardiac Doppler Indications:    Dyspnea  History:        Patient has prior history of Echocardiogram examinations, most                 recent 01/07/2021. CHF, CAD, Pulmonary HTN; Risk                 Factors:Diabetes.  Sonographer:    Bernadene Person RDCS Referring Phys: [place]  1. Left ventricular ejection fraction, by estimation, is >75%. The left ventricle has hyperdynamic function. The left ventricle has no regional wall motion abnormalities. There is moderate left ventricular hypertrophy. Left ventricular diastolic parameters are consistent with Grade II diastolic dysfunction (pseudonormalization). Elevated left atrial pressure.  2. The ventricular septum is flattened in systole and diastole consistent with RV pressure and volume overload. Relatively preserved RV anular vertical motion however decreased free wall contraction in the horizontal plane, TAPSE and S' likely poor indicators for RV function. . Right ventricular systolic function is moderately reduced. The right ventricular size is moderately enlarged. Tricuspid regurgitation signal is inadequate for assessing PA pressure.  3. Left atrial size was moderately dilated.  4. A small pericardial effusion is present. The pericardial effusion is circumferential.  5. The mitral valve is abnormal. Mild to moderate mitral valve regurgitation. Moderate to severe mitral stenosis. Moderate mitral  annular calcification. The mean mitral valve gradient is 10.0 mmHg.  6. Tricuspid valve regurgitation is mild to moderate.  7. The aortic valve is tricuspid. There is moderate calcification of the aortic valve. There is moderate thickening of the aortic valve. Aortic valve regurgitation is mild. Moderate aortic valve stenosis.  8. The inferior vena cava is normal in size with <50% respiratory variability, suggesting right atrial pressure of 8 mmHg. FINDINGS  Left Ventricle: Left ventricular ejection fraction, by estimation, is >75%. The left ventricle has hyperdynamic function. The left ventricle has no regional wall motion abnormalities. The left ventricular internal cavity size was normal in size. There is moderate left ventricular hypertrophy. Left ventricular diastolic parameters are consistent with Grade II diastolic dysfunction (pseudonormalization). Elevated left atrial pressure. Right Ventricle: The ventricular septum is flattened in systole  and diastole consistent with RV pressure and volume overload. Relatively preserved RV anular vertical motion however decreased free wall contraction in the horizontal plane, TAPSE and S' likely poor indicators for RV function. The right ventricular size is moderately enlarged. Right vetricular wall thickness was not well visualized. Right ventricular systolic function is moderately reduced. Tricuspid regurgitation signal is inadequate for assessing PA pressure. Left Atrium: Left atrial size was moderately dilated. Right Atrium: Right atrial size was not well visualized. Pericardium: A small pericardial effusion is present. The pericardial effusion is circumferential. Mitral Valve: The mitral valve is abnormal. There is moderate thickening of the mitral valve leaflet(s). There is moderate calcification of the mitral valve leaflet(s). Moderate mitral annular calcification. Mild to moderate mitral valve regurgitation. Moderate to severe mitral valve stenosis. The mean  mitral valve gradient is 10.0 mmHg. Tricuspid Valve: The tricuspid valve is not well visualized. Tricuspid valve regurgitation is mild to moderate. No evidence of tricuspid stenosis. Aortic Valve: The aortic valve is tricuspid. There is moderate calcification of the aortic valve. There is moderate thickening of the aortic valve. There is moderate aortic valve annular calcification. Aortic valve regurgitation is mild. Aortic regurgitation PHT measures 314 msec. Moderate aortic stenosis is present. Aortic valve mean gradient measures 27.6 mmHg. Aortic valve peak gradient measures 44.8 mmHg. Aortic valve area, by VTI measures 1.11 cm. Pulmonic Valve: The pulmonic valve was not well visualized. Pulmonic valve regurgitation is not visualized. No evidence of pulmonic stenosis. Aorta: The aortic root is normal in size and structure. Venous: The inferior vena cava is normal in size with less than 50% respiratory variability, suggesting right atrial pressure of 8 mmHg. IAS/Shunts: The interatrial septum was not well visualized.  LEFT VENTRICLE PLAX 2D LVIDd:         3.80 cm   Diastology LVIDs:         2.50 cm   LV e' medial:    4.45 cm/s LV PW:         1.30 cm   LV E/e' medial:  48.5 LV IVS:        1.20 cm   LV e' lateral:   5.35 cm/s LVOT diam:     1.90 cm   LV E/e' lateral: 40.4 LV SV:         82 LV SV Index:   46 LVOT Area:     2.84 cm  RIGHT VENTRICLE RV S prime:     11.20 cm/s TAPSE (M-mode): 1.6 cm LEFT ATRIUM             Index        RIGHT ATRIUM           Index LA diam:        5.10 cm 2.87 cm/m   RA Area:     14.60 cm LA Vol (A2C):   74.9 ml 42.14 ml/m  RA Volume:   34.40 ml  19.35 ml/m LA Vol (A4C):   78.0 ml 43.88 ml/m LA Biplane Vol: 75.8 ml 42.64 ml/m  AORTIC VALVE AV Area (Vmax):    1.17 cm AV Area (Vmean):   1.04 cm AV Area (VTI):     1.11 cm AV Vmax:           334.80 cm/s AV Vmean:          247.600 cm/s AV VTI:            0.740 m AV Peak Grad:      44.8 mmHg AV Mean  Grad:      27.6 mmHg LVOT Vmax:          138.00 cm/s LVOT Vmean:        91.000 cm/s LVOT VTI:          0.290 m LVOT/AV VTI ratio: 0.39 AI PHT:            314 msec  AORTA Ao Root diam: 3.30 cm Ao Asc diam:  3.20 cm MITRAL VALVE MV Area (PHT): 3.65 cm     SHUNTS MV Mean grad:  10.0 mmHg    Systemic VTI:  0.29 m MV Decel Time: 208 msec     Systemic Diam: 1.90 cm MV E velocity: 216.00 cm/s MV A velocity: 145.00 cm/s MV E/A ratio:  1.49 Carlyle Dolly MD Electronically signed by Carlyle Dolly MD Signature Date/Time: 07/18/2021/3:50:11 PM    Final      Medical Consultants:   None.   Subjective:    MALANI LEES relates her breathing is about the same but slightly improved as admission  Objective:    Vitals:   07/19/21 1900 07/19/21 2100 07/20/21 0409 07/20/21 0915  BP: (!) 129/55 (!) 115/52 (!) 116/54 (!) 132/96  Pulse: 78 73 70 76  Resp: (!) 21  20   Temp: 98.2 F (36.8 C)  98.5 F (36.9 C)   TempSrc: Oral  Oral   SpO2: 91% 95% 96%   Weight:   69.8 kg   Height:       SpO2: 96 % O2 Flow Rate (L/min): 4 L/min   Intake/Output Summary (Last 24 hours) at 07/20/2021 1026 Last data filed at 07/20/2021 0423 Gross per 24 hour  Intake 240 ml  Output 1000 ml  Net -760 ml    Filed Weights   07/18/21 0352 07/19/21 0310 07/20/21 0409  Weight: 70.5 kg 70.6 kg 69.8 kg    Exam: General exam: In no acute distress. Respiratory system: Good air movement and clear to auscultation. Cardiovascular system: S1 & S2 heard, RRR. + JVD Gastrointestinal system: Abdomen is nondistended, soft and nontender.  Extremities: No pedal edema. Skin: No rashes, lesions or ulcers Psychiatry: Judgement and insight appear normal. Mood & affect appropriate.  Data Reviewed:    Labs: Basic Metabolic Panel: Recent Labs  Lab 07/17/21 0700 07/17/21 1114 07/18/21 0342 07/19/21 0103 07/19/21 1513 07/20/21 0428  NA 136 136 137 136 139  140 138  K 3.6 3.6 3.7 3.3* 3.6  3.5 3.7  CL 108 108 111 109  --  108  CO2 16* 15* 14* 18*  --   17*  GLUCOSE 145* 225* 131* 141*  --  137*  BUN 95* 93* 89* 89*  --  83*  CREATININE 4.01* 4.05* 3.78* 3.43*  --  3.45*  CALCIUM 8.0* 8.0* 8.3* 8.2*  --  8.5*  MG  --   --  1.9  --   --  2.1    GFR Estimated Creatinine Clearance: 13.2 mL/min (A) (by C-G formula based on SCr of 3.45 mg/dL (H)). Liver Function Tests: Recent Labs  Lab 07/16/21 2004  AST 12*  ALT 9  ALKPHOS 54  BILITOT 0.6  PROT 6.4*  ALBUMIN 3.4*    No results for input(s): LIPASE, AMYLASE in the last 168 hours. No results for input(s): AMMONIA in the last 168 hours. Coagulation profile No results for input(s): INR, PROTIME in the last 168 hours. COVID-19 Labs  No results for input(s): DDIMER, FERRITIN, LDH, CRP in the last 72 hours.  Lab Results  Component Value Date   SARSCOV2NAA NEGATIVE 07/17/2021   [place] NEGATIVE 01/05/2021    CBC: Recent Labs  Lab 07/16/21 2004 07/18/21 0727 07/19/21 0103 07/19/21 1513 07/20/21 0428  WBC 8.6 6.6 7.7  --  8.6  NEUTROABS 7.2  --   --   --   --   HGB 8.8* 7.8* 7.4* 11.2*  7.5* 8.1*  HCT 28.0* 24.5* 23.5* 33.0*  22.0* 26.5*  MCV 86.2 85.1 85.8  --  87.2  PLT 339 296 287  --  345    Cardiac Enzymes: No results for input(s): CKTOTAL, CKMB, CKMBINDEX, TROPONINI in the last 168 hours. BNP (last 3 results) Recent Labs    01/26/21 1313  PROBNP 823.0*    CBG: No results for input(s): GLUCAP in the last 168 hours. D-Dimer: No results for input(s): DDIMER in the last 72 hours. Hgb A1c: No results for input(s): HGBA1C in the last 72 hours. Lipid Profile: No results for input(s): CHOL, HDL, LDLCALC, TRIG, CHOLHDL, LDLDIRECT in the last 72 hours. Thyroid function studies: No results for input(s): TSH, T4TOTAL, T3FREE, THYROIDAB in the last 72 hours.  Invalid input(s): FREET3 Anemia work up: No results for input(s): VITAMINB12, FOLATE, FERRITIN, TIBC, IRON, RETICCTPCT in the last 72 hours. Sepsis Labs: Recent Labs  Lab 07/16/21 2004  07/18/21 0727 07/19/21 0103 07/20/21 0428  WBC 8.6 6.6 7.7 8.6    Microbiology Recent Results (from the past 240 hour(s))  Resp Panel by RT-PCR (Flu A&B, Covid) Nasopharyngeal Swab     Status: None   Collection Time: 07/17/21  3:05 AM   Specimen: Nasopharyngeal Swab; Nasopharyngeal(NP) swabs in vial transport medium  Result Value Ref Range Status   SARS Coronavirus 2 by RT PCR NEGATIVE NEGATIVE Final    Comment: (NOTE) SARS-CoV-2 target nucleic acids are NOT DETECTED.  The SARS-CoV-2 RNA is generally detectable in upper respiratory specimens during the acute phase of infection. The lowest concentration of SARS-CoV-2 viral copies this assay can detect is 138 copies/mL. A negative result does not preclude SARS-Cov-2 infection and should not be used as the sole basis for treatment or other patient management decisions. A negative result may occur with  improper specimen collection/handling, submission of specimen other than nasopharyngeal swab, presence of viral mutation(s) within the areas targeted by this assay, and inadequate number of viral copies(<138 copies/mL). A negative result must be combined with clinical observations, patient history, and epidemiological information. The expected result is Negative.  Fact Sheet for Patients:  EntrepreneurPulse.com.au  Fact Sheet for Healthcare Providers:  IncredibleEmployment.be  This test is no t yet approved or cleared by the [place] FDA and  has been authorized for detection and/or diagnosis of SARS-CoV-2 by FDA under an Emergency Use Authorization (EUA). This EUA will remain  in effect (meaning this test can be used) for the duration of the COVID-19 declaration under Section 564(b)(1) of the Act, 21 U.S.C.section 360bbb-3(b)(1), unless the authorization is terminated  or revoked sooner.       Influenza A by PCR NEGATIVE NEGATIVE Final   Influenza B by PCR NEGATIVE NEGATIVE Final     Comment: (NOTE) The Xpert Xpress SARS-CoV-2/FLU/RSV plus assay is intended as an aid in the diagnosis of influenza from Nasopharyngeal swab specimens and should not be used as a sole basis for treatment. Nasal washings and aspirates are unacceptable for Xpert Xpress SARS-CoV-2/FLU/RSV testing.  Fact Sheet for Patients: EntrepreneurPulse.com.au  Fact Sheet for Healthcare Providers: IncredibleEmployment.be  This test is not yet approved  or cleared by the [place] and has been authorized for detection and/or diagnosis of SARS-CoV-2 by FDA under an Emergency Use Authorization (EUA). This EUA will remain in effect (meaning this test can be used) for the duration of the COVID-19 declaration under Section 564(b)(1) of the Act, 21 U.S.C. section 360bbb-3(b)(1), unless the authorization is terminated or revoked.  Performed at [facility], [place] [address] [place], [place] [address]      Medications:    atorvastatin  20 mg Oral Daily   furosemide  80 mg Intravenous BID   levothyroxine  125 mcg Oral QAC breakfast   metolazone  2.5 mg Oral Once   metoprolol succinate  25 mg Oral BID   pantoprazole  40 mg Oral BID   Riociguat  2.5 mg Oral TID   Selexipag  1,600 mcg Oral BID   Selexipag  800 mcg Oral BID   sodium bicarbonate  650 mg Oral TID   sodium chloride flush  3 mL Intravenous Q12H   sodium chloride flush  3 mL Intravenous Q12H   sodium chloride flush  3 mL Intravenous Q12H   Continuous Infusions:  sodium chloride        LOS: 3 days   Charlynne Cousins  [facility]  07/20/2021, 10:26 AM

## 2021-07-21 ENCOUNTER — Inpatient Hospital Stay (HOSPITAL_COMMUNITY): Payer: Medicare Other

## 2021-07-21 ENCOUNTER — Encounter (HOSPITAL_COMMUNITY): Payer: Self-pay | Admitting: Family Medicine

## 2021-07-21 ENCOUNTER — Encounter (HOSPITAL_COMMUNITY)
Admission: EM | Disposition: A | Discharge status: Home / Self Care | Payer: Self-pay | Source: Home / Self Care | Attending: Family Medicine

## 2021-07-21 ENCOUNTER — Inpatient Hospital Stay (HOSPITAL_COMMUNITY): Payer: Medicare Other | Admitting: Certified Registered Nurse Anesthetist

## 2021-07-21 DIAGNOSIS — I342 Nonrheumatic mitral (valve) stenosis: Secondary | ICD-10-CM

## 2021-07-21 DIAGNOSIS — N179 Acute kidney failure, unspecified: Secondary | ICD-10-CM | POA: Diagnosis not present

## 2021-07-21 DIAGNOSIS — I5033 Acute on chronic diastolic (congestive) heart failure: Secondary | ICD-10-CM

## 2021-07-21 DIAGNOSIS — R0602 Shortness of breath: Secondary | ICD-10-CM | POA: Diagnosis not present

## 2021-07-21 DIAGNOSIS — Z7189 Other specified counseling: Secondary | ICD-10-CM

## 2021-07-21 DIAGNOSIS — I350 Nonrheumatic aortic (valve) stenosis: Secondary | ICD-10-CM | POA: Diagnosis not present

## 2021-07-21 DIAGNOSIS — Z515 Encounter for palliative care: Secondary | ICD-10-CM

## 2021-07-21 DIAGNOSIS — I340 Nonrheumatic mitral (valve) insufficiency: Secondary | ICD-10-CM

## 2021-07-21 DIAGNOSIS — I3139 Other pericardial effusion (noninflammatory): Secondary | ICD-10-CM

## 2021-07-21 DIAGNOSIS — N184 Chronic kidney disease, stage 4 (severe): Secondary | ICD-10-CM | POA: Diagnosis not present

## 2021-07-21 HISTORY — PX: TEE WITHOUT CARDIOVERSION: SHX5443

## 2021-07-21 LAB — PREPARE RBC (CROSSMATCH)

## 2021-07-21 LAB — ECHO TEE
AR max vel: 0.89 cm2
AV Area VTI: 0.87 cm2
AV Area mean vel: 0.8 cm2
AV Mean grad: 46 mmHg
AV Peak grad: 74.1 mmHg
Ao pk vel: 4.31 m/s
Area-P 1/2: 1.83 cm2
MV VTI: 1.3 cm2

## 2021-07-21 LAB — ABO/RH: ABO/RH(D): A POS

## 2021-07-21 LAB — CBC
HCT: 22.3 % — ABNORMAL LOW (ref 36.0–46.0)
Hemoglobin: 6.8 g/dL — CL (ref 12.0–15.0)
MCH: 27 pg (ref 26.0–34.0)
MCHC: 30.5 g/dL (ref 30.0–36.0)
MCV: 88.5 fL (ref 80.0–100.0)
Platelets: 298 10*3/uL (ref 150–400)
RBC: 2.52 MIL/uL — ABNORMAL LOW (ref 3.87–5.11)
RDW: 19.5 % — ABNORMAL HIGH (ref 11.5–15.5)
WBC: 7.9 10*3/uL (ref 4.0–10.5)
nRBC: 0 % (ref 0.0–0.2)

## 2021-07-21 LAB — BASIC METABOLIC PANEL
Anion gap: 11 (ref 5–15)
BUN: 83 mg/dL — ABNORMAL HIGH (ref 8–23)
CO2: 19 mmol/L — ABNORMAL LOW (ref 22–32)
Calcium: 8.2 mg/dL — ABNORMAL LOW (ref 8.9–10.3)
Chloride: 108 mmol/L (ref 98–111)
Creatinine, Ser: 3.4 mg/dL — ABNORMAL HIGH (ref 0.44–1.00)
GFR, Estimated: 13 mL/min — ABNORMAL LOW (ref >60)
Glucose, Bld: 113 mg/dL — ABNORMAL HIGH (ref 70–99)
Potassium: 3.4 mmol/L — ABNORMAL LOW (ref 3.5–5.1)
Sodium: 138 mmol/L (ref 135–145)

## 2021-07-21 LAB — POCT I-STAT, CHEM 8
BUN: 87 mg/dL — ABNORMAL HIGH (ref 8–23)
Calcium, Ion: 1.25 mmol/L (ref 1.15–1.40)
Chloride: 108 mmol/L (ref 98–111)
Creatinine, Ser: 3.5 mg/dL — ABNORMAL HIGH (ref 0.44–1.00)
Glucose, Bld: 147 mg/dL — ABNORMAL HIGH (ref 70–99)
HCT: 25 % — ABNORMAL LOW (ref 36.0–46.0)
Hemoglobin: 8.5 g/dL — ABNORMAL LOW (ref 12.0–15.0)
Potassium: 3.3 mmol/L — ABNORMAL LOW (ref 3.5–5.1)
Sodium: 143 mmol/L (ref 135–145)
TCO2: 19 mmol/L — ABNORMAL LOW (ref 22–32)

## 2021-07-21 LAB — HEMOGLOBIN AND HEMATOCRIT, BLOOD
HCT: 29.4 % — ABNORMAL LOW (ref 36.0–46.0)
Hemoglobin: 9.4 g/dL — ABNORMAL LOW (ref 12.0–15.0)

## 2021-07-21 SURGERY — ECHOCARDIOGRAM, TRANSESOPHAGEAL
Anesthesia: Monitor Anesthesia Care

## 2021-07-21 MED ORDER — EPHEDRINE SULFATE 50 MG/ML IJ SOLN
INTRAMUSCULAR | Status: DC | PRN
  Administered 2021-07-21: 5 mg via INTRAVENOUS

## 2021-07-21 MED ORDER — PHENYLEPHRINE 40 MCG/ML (10ML) SYRINGE FOR IV PUSH (FOR BLOOD PRESSURE SUPPORT)
PREFILLED_SYRINGE | INTRAVENOUS | Status: DC | PRN
  Administered 2021-07-21: 80 ug via INTRAVENOUS

## 2021-07-21 MED ORDER — SODIUM CHLORIDE 0.9% IV SOLUTION: Freq: Once | INTRAVENOUS | Status: AC

## 2021-07-21 MED ORDER — PROPOFOL 500 MG/50ML IV EMUL
INTRAVENOUS | Status: DC | PRN
  Administered 2021-07-21: 100 ug/kg/min via INTRAVENOUS

## 2021-07-21 MED ORDER — LIDOCAINE 2% (20 MG/ML) 5 ML SYRINGE
INTRAMUSCULAR | Status: DC | PRN
  Administered 2021-07-21: 50 mg via INTRAVENOUS

## 2021-07-21 MED ORDER — PROPOFOL 10 MG/ML IV BOLUS
INTRAVENOUS | Status: DC | PRN
  Administered 2021-07-21: 10 mg via INTRAVENOUS
  Administered 2021-07-21 (×2): 20 mg via INTRAVENOUS

## 2021-07-21 MED ORDER — SODIUM CHLORIDE 0.9 % IV SOLN: INTRAVENOUS | Status: DC | PRN

## 2021-07-21 MED ORDER — PHENYLEPHRINE HCL-NACL 20-0.9 MG/250ML-% IV SOLN
INTRAVENOUS | Status: DC | PRN
  Administered 2021-07-21: 25 ug/min via INTRAVENOUS

## 2021-07-21 MED ORDER — POTASSIUM CHLORIDE CRYS ER 20 MEQ PO TBCR
40.0000 meq | EXTENDED_RELEASE_TABLET | Freq: Once | ORAL | Status: AC
  Administered 2021-07-21: 40 meq via ORAL
  Filled 2021-07-21 (×2): qty 2

## 2021-07-21 MED ORDER — METOLAZONE 2.5 MG PO TABS
2.5000 mg | ORAL_TABLET | Freq: Once | ORAL | Status: AC
  Administered 2021-07-21: 2.5 mg via ORAL
  Filled 2021-07-21 (×2): qty 1

## 2021-07-21 NOTE — Evaluation (Signed)
Occupational Therapy Evaluation Patient Details Name: Alisha [name] MRN: [id number] DOB: [birth year] Today's Date: 07/21/2021   History of Present Illness 80 y.o. F admitted on 11/5 due to SoB, stage 4 kidney failure, and end stage CHF. PMH significnat for CKD, Chronic GI Bleed, HTN, CHF.   Clinical Impression   Pt admitted for concerns listed above. PTA pt reported that she had been very independent with all ADL's and IADL's, living alone in an independent living facility. At this time, due to her prognosis, pt reported that she will be going home to live with her daughter, whose home set up is listed below. Pt continues to demonstrate independent functional mobility and ADL performance, just with decreased activity tolerance. She has no OT needs at this time and acute OT will sign off.       Recommendations for follow up therapy are one component of a multi-disciplinary discharge planning process, led by the attending physician.  Recommendations may be updated based on patient status, additional functional criteria and insurance authorization.   Follow Up Recommendations  No OT follow up    Assistance Recommended at Discharge Set up Supervision/Assistance  Functional Status Assessment  Patient has not had a recent decline in their functional status  Equipment Recommendations  Tub/shower bench    Recommendations for Other Services       Precautions / Restrictions Precautions Precautions: Fall Precaution Comments: Watch O2 Restrictions Weight Bearing Restrictions: No      Mobility Bed Mobility Overal bed mobility: Independent             General bed mobility comments: No difficulties noted    Transfers Overall transfer level: Modified independent Equipment used: None               General transfer comment: Use of rails around toilert      Balance Overall balance assessment: No apparent balance deficits (not formally assessed)                                          ADL either performed or assessed with clinical judgement   ADL Overall ADL's : Modified independent;At baseline                                       General ADL Comments: Pt becomes windede quicker, however was able to complete toileting, grooming, LB dressing and mini clean up in bathroom with no assist. All transfers are Independent as well .     Vision Baseline Vision/History: 1 Wears glasses Ability to See in Adequate Light: 0 Adequate Patient Visual Report: No change from baseline Vision Assessment?: No apparent visual deficits     Perception     Praxis      Pertinent Vitals/Pain Pain Assessment: No/denies pain     Hand Dominance Right   Extremity/Trunk Assessment Upper Extremity Assessment Upper Extremity Assessment: Overall WFL for tasks assessed   Lower Extremity Assessment Lower Extremity Assessment: Defer to PT evaluation   Cervical / Trunk Assessment Cervical / Trunk Assessment: Normal   Communication Communication Communication: No difficulties   Cognition Arousal/Alertness: Awake/alert Behavior During Therapy: WFL for tasks assessed/performed Overall Cognitive Status: Within Functional Limits for tasks assessed  General Comments  O2 98-100% on 3L    Exercises     Shoulder Instructions      Home Living Family/patient expects to be discharged to:: Private residence Living Arrangements: Children (daughter) Available Help at Discharge: Family;Available 24 hours/day Type of Home: House Home Access: Stairs to enter [employer] of Steps: 2-3 mini steps Entrance Stairs-Rails: None Home Layout: One level     Bathroom Shower/Tub: Teacher, early years/pre: Standard Bathroom Accessibility: Yes How Accessible: Accessible via walker Home Equipment: Cane - single point          Prior Functioning/Environment Prior Level of  Function : Independent/Modified Independent             Mobility Comments: Pt reported last week she was ambulating with no DME ADLs Comments: Pt reported last week she was independent with all ADLs and IADLs        OT Problem List: Decreased strength;Decreased activity tolerance;Cardiopulmonary status limiting activity      OT Treatment/Interventions:      OT Goals(Current goals can be found in the care plan section) Acute Rehab OT Goals Patient Stated Goal: To go home to be with her family OT Goal Formulation: With patient Time For Goal Achievement: 07/21/21 Potential to Achieve Goals: Good  OT Frequency:     Barriers to D/C:            Co-evaluation              AM-PAC OT "6 Clicks" Daily Activity     Outcome Measure Help from another person eating meals?: None Help from another person taking care of personal grooming?: None Help from another person toileting, which includes using toliet, bedpan, or urinal?: None Help from another person bathing (including washing, rinsing, drying)?: None Help from another person to put on and taking off regular upper body clothing?: None Help from another person to put on and taking off regular lower body clothing?: None 6 Click Score: 24   End of Session Equipment Utilized During Treatment: Oxygen Nurse Communication: Mobility status  Activity Tolerance: Patient tolerated treatment well Patient left: in bed;with call bell/phone within reach  OT Visit Diagnosis: Muscle weakness (generalized) (M62.81);Unsteadiness on feet (R26.81)                Time: 1713-1740 OT Time Calculation (min): 27 min Charges:  OT General Charges $OT Visit: 1 Visit OT Evaluation $OT Eval Low Complexity: 1 Low OT Treatments $Self Care/Home Management : 8-22 mins  Chenika Nevils H., OTR/L Acute Rehabilitation  [place] Aizlyn Schifano 07/21/2021, 6:06 PM

## 2021-07-21 NOTE — Anesthesia Procedure Notes (Signed)
Procedure Name: MAC Date/Time: 07/21/2021 1:24 PM Performed by: Leonor Liv, CRNA Pre-anesthesia Checklist: Patient identified, Emergency Drugs available, Suction available, Patient being monitored and Timeout performed Patient Re-evaluated:Patient Re-evaluated prior to induction Oxygen Delivery Method: Simple face mask Airway Equipment and Method: Bite block Placement Confirmation: positive ETCO2 Dental Injury: Teeth and Oropharynx as per pre-operative assessment  Comments: POM utilized

## 2021-07-21 NOTE — Anesthesia Preprocedure Evaluation (Signed)
Anesthesia Evaluation  Patient identified by MRN, date of birth, ID band Patient awake    Reviewed: Allergy & Precautions, NPO status , Patient's Chart, lab work & pertinent test results  Airway Mallampati: II  TM Distance: >3 FB Neck ROM: Full    Dental no notable dental hx.    Pulmonary neg pulmonary ROS,    Pulmonary exam normal        Cardiovascular hypertension, Pt. on medications and Pt. on home beta blockers + CAD, + Peripheral Vascular Disease and +CHF  + Valvular Problems/Murmurs AS  Rhythm:Regular Rate:Normal     Neuro/Psych negative neurological ROS  negative psych ROS   GI/Hepatic Neg liver ROS, GERD  Medicated,  Endo/Other  diabetes, Oral Hypoglycemic AgentsHypothyroidism   Renal/GU negative Renal ROS  negative genitourinary   Musculoskeletal negative musculoskeletal ROS (+)   Abdominal Normal abdominal exam  (+)   Peds  Hematology  (+) anemia ,   Anesthesia Other Findings   Reproductive/Obstetrics                             Anesthesia Physical Anesthesia Plan  ASA: 3  Anesthesia Plan: MAC   Post-op Pain Management:    Induction: Intravenous  PONV Risk Score and Plan: 2 and Propofol infusion and Treatment may vary due to age or medical condition  Airway Management Planned: Natural Airway, Simple Face Mask and Nasal Cannula  Additional Equipment: None  Intra-op Plan:   Post-operative Plan:   Informed Consent: I have reviewed the patients History and Physical, chart, labs and discussed the procedure including the risks, benefits and alternatives for the proposed anesthesia with the patient or authorized representative who has indicated his/her understanding and acceptance.     Dental advisory given  Plan Discussed with: CRNA  Anesthesia Plan Comments: (DOS note: Hgb 6.8, just completed 1U PRBC prior to procedure, will check post.  Lab Results       Component                Value               Date                      WBC                      7.9                 07/21/2021                HGB                      6.8 (LL)            07/21/2021                HCT                      22.3 (L)            07/21/2021                MCV                      88.5                07/21/2021  PLT                      298                 07/21/2021           Lab Results      Component                Value               Date                      NA                       138                 07/21/2021                K                        3.4 (L)             07/21/2021                CO2                      19 (L)              07/21/2021                GLUCOSE                  113 (H)             07/21/2021                BUN                      83 (H)              07/21/2021                CREATININE               3.40 (H)            07/21/2021                CALCIUM                  8.2 (L)             07/21/2021                GFRNONAA                 13 (L)              07/21/2021          )        Anesthesia Quick Evaluation

## 2021-07-21 NOTE — Interval H&P Note (Signed)
History and Physical Interval Note:  07/21/2021 1:27 PM  Alisha [name]  has presented today for surgery, with the diagnosis of aortic stenosis, mitral stenosis.  The various methods of treatment have been discussed with the patient and family. After consideration of risks, benefits and other options for treatment, the patient has consented to  Procedure(s): TRANSESOPHAGEAL ECHOCARDIOGRAM (TEE) (N/A) as a surgical intervention.  The patient's history has been reviewed, patient examined, no change in status, stable for surgery.  I have reviewed the patient's chart and labs.  Questions were answered to the patient's satisfaction.     Ziaire Hagos [employer]

## 2021-07-21 NOTE — H&P (View-Only) (Signed)
Advanced Heart Failure Rounding Note  PCP-Cardiologist: Dr. Aundra Dubin   Subjective:    -1L yesterday with IV lasix 80 mg BID. However, weight down another 2 lb. Reports good output.   Short of breath with minimal exertion. O2 sats stable on 4L [place]  Scr stable at 3.5 yesterday. No BMET this am, STAT draw pending.  Hgb 6.8 this am, receiving 1 U PRBCs  Going for TEE today to better assess severity of AS and MS  RHC Procedural Findings: Hemodynamics (mmHg) RA mean 10 RV 86/14 PA 90/29, mean 53 PCWP mean 20  Oxygen saturations: PA 61% AO 95%  Cardiac Output (Fick) 6.93  Cardiac Index (Fick) 3.89 PVR 4.8 WU   Objective:   Weight Range: 72.1 kg Body mass index is 26.45 kg/m.   Vital Signs:   Temp:  [97.5 F (36.4 C)-98.4 F (36.9 C)] 97.9 F (36.6 C) (11/09 0430) Pulse Rate:  [72-76] 72 (11/09 0430) Resp:  [18-19] 19 (11/08 1932) BP: (114-132)/(47-96) 128/53 (11/09 0430) SpO2:  [94 %-98 %] 94 % (11/09 0430) Weight:  [72.1 kg] 72.1 kg (11/09 0430) Last BM Date: 07/20/21  Weight change: Filed Weights   07/19/21 0310 07/20/21 0409 07/21/21 0430  Weight: 70.6 kg 69.8 kg 72.1 kg    Intake/Output:   Intake/Output Summary (Last 24 hours) at 07/21/2021 0752 Last data filed at 07/21/2021 0500 Gross per 24 hour  Intake 720 ml  Output 1150 ml  Net -430 ml      Physical Exam    General:  Lying in bed. No acute distress.  HEENT: Normal Neck: Supple. JVD 10 cm . Carotids 2+ bilat; no bruits. No lymphadenopathy or thyromegaly appreciated. Cor: PMI nondisplaced. Regular rate & rhythm. 3/6 SEM RUSB Lungs: Diminished Abdomen: Soft, nontender, nondistended. No hepatosplenomegaly.  Extremities: No cyanosis, clubbing, rash, edema Neuro: Alert & orientedx3, cranial nerves grossly intact. moves all 4 extremities w/o difficulty. Affect pleasant   Telemetry   NSR 70s-80s, personally reviewed   Labs    CBC Recent Labs    07/20/21 0428 07/21/21 0259  WBC 8.6  7.9  HGB 8.1* 6.8*  HCT 26.5* 22.3*  MCV 87.2 88.5  PLT 345 322   Basic Metabolic Panel Recent Labs    07/19/21 0103 07/19/21 1513 07/20/21 0428  NA 136 139  140 138  K 3.3* 3.6  3.5 3.7  CL 109  --  108  CO2 18*  --  17*  GLUCOSE 141*  --  137*  BUN 89*  --  83*  CREATININE 3.43*  --  3.45*  CALCIUM 8.2*  --  8.5*  MG  --   --  2.1   Liver Function Tests No results for input(s): AST, ALT, ALKPHOS, BILITOT, PROT, ALBUMIN in the last 72 hours. No results for input(s): LIPASE, AMYLASE in the last 72 hours. Cardiac Enzymes No results for input(s): CKTOTAL, CKMB, CKMBINDEX, TROPONINI in the last 72 hours.  BNP: BNP (last 3 results) Recent Labs    06/18/21 1225 07/17/21 0336 07/19/21 0103  BNP 986.2* 1,247.8* 1,758.6*    ProBNP (last 3 results) Recent Labs    01/26/21 1313  PROBNP 823.0*     D-Dimer No results for input(s): DDIMER in the last 72 hours. Hemoglobin A1C No results for input(s): HGBA1C in the last 72 hours. Fasting Lipid Panel No results for input(s): CHOL, HDL, LDLCALC, TRIG, CHOLHDL, LDLDIRECT in the last 72 hours. Thyroid Function Tests No results for input(s): TSH, T4TOTAL, T3FREE, THYROIDAB in  the last 72 hours.  Invalid input(s): FREET3  Other results:   Imaging    No results found.   Medications:     Scheduled Medications:  sodium chloride   Intravenous Once   atorvastatin  20 mg Oral Daily   furosemide  80 mg Intravenous BID   levothyroxine  125 mcg Oral QAC breakfast   metoprolol succinate  25 mg Oral BID   pantoprazole  40 mg Oral BID   Riociguat  2.5 mg Oral TID   Selexipag  1,600 mcg Oral BID   Selexipag  800 mcg Oral BID   sodium bicarbonate  650 mg Oral TID   sodium chloride flush  3 mL Intravenous Q12H   sodium chloride flush  3 mL Intravenous Q12H   sodium chloride flush  3 mL Intravenous Q12H    Infusions:  sodium chloride     sodium chloride      PRN Medications: sodium chloride, acetaminophen,  ondansetron (ZOFRAN) IV, sodium chloride flush   Assessment/Plan   Assessment/Plan: 1. Pulmonary hypertension with RV failure:  Severe PAH with PVR 7 WU on 1/19 RHC.  Etiology uncertain.  She has a history of DVT remotely but V/Q scan in 2/19 did not show evidence for chronic PE. She has OSA and uses CPAP.  Serologic workup showed only positive ANA but when additional serologies were done, they were all negative.  CT chest with no ILD or emphysema.  Valvular heart disease does not appear significant enough to cause this degree of PH.  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  Echo in 4/19 showed that the RV is moderately dilated with mildly decreased systolic function, PA systolic pressure estimation was only 36 mmHg but poor envelope and likely underestimated. She did not tolerate Opsumit or ambrisentan due to significant lower extremity edema and worsening dyspnea.  [place] in 4/20 showed severe pulmonary hypertension with normal filling pressures and preserved cardiac output.  Echo in 4/21 showed normal RV with PASP 50 mmHg, echo 4/22 showed normal RV with PASP estimated 41 mmHg. TEE in 4/22 showed D-shaped septum with mild RV enlargement/mildly decreased RV systolic function.  At baseline, has oxygen for exertion.  Echo this admission with D-shaped septum, moderate RVE and moderate RV dysfunction, also concerning for worsening valvular HD.  RHC this admit showed mild-moderately elevated right and left filling pressures, preserved cardiac output and severe primarily pulmonary arterial hypertension. PVR 4.8 WU. 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  - Continue Adempas 2.5 mg tid.  - Continue Uptravi 2400 mcg BID.   2. Acute on chronic diastolic CHF: In setting of pulmonary hypertension and valvular disease.  Worsened symptoms with volume overload on exam.  Unfortunately, also appears to have developed progressive cardiorenal syndrome. RHC showed mild-moderately elevated right and left filling pressures, preserved  cardiac output, mRA 10, mPCWP 20, CI 3.89  - continue IV Lasix 80 mg bid. Is/Os reflect only 1L output but reports good diuresis. Weight down another 2 lb. STAT bmet this am. 3. Syncope: Suspect related to severe pulmonary hypertension.  None recently.   4. 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  With heavy calcification, I do not think that she would be a mitral valvuloplasty candidate.  AS also has been moderate in the past.  Echo this admission reviewed, I am worried for progressive valvular heart disease leading to worsening volume status as well as worsening cardiorenal syndrome.  Echo this admission with moderate-severe  aortic stenosis (mean gradient 34 mmHg with AVA around 1.0 cm^2) and moderate-severe mitral stenosis (mean gradient 10 mmHg).  - TEE planned for this afternoon - Could potentially undergo TAVR for aortic valve but MV looks too calcified for valvuloplasty.  She would be a very high risk surgical patient.  Our options are likely going to be limited.  5. H/o DVT: No chronic PE on prior V/Q scan.   6. OSA: Continue CPAP qHS.  7. AKI on CKD Stage 3: Prior baseline creatinine has been around 1.6, recently with significant increase.  Admitted with creatinine at 4.4, now down to 3.45.  Concern for progressive cardiorenal syndrome possibly with worsening valvular HD.  8. H/o GI bleeding: Recurrent.  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 Hgb down to 7.4 this admit. Down further to 6.8 this am. To get 1 U pRBCs - Would involve GI, had deep endoscopy in 10/22 without source of bleeding found.  - Iron deficient. Had been receiving IV iron through [facility] consulted  PT/OT, CR   Length of Stay: [place], [place], PA-C  07/21/2021, 7:52 AM  Advanced Heart Failure Team Pager [phone] (M-F; 7a - 5p)  Please contact [facility] for night-coverage after hours (5p -7a ) and weekends on  [email or web address]  Patient seen with PA, agree with the above note.   Filling pressures elevated on RHC this admission but not markedly elevated.  Still with severe PAH.     Diuresis with weight down another 2 lbs though not reflected in I/Os.  Creatinine stable at 3.45 => 3.4.     Still short of breath walking to bathroom.    Hgb 6.8 today, down from yesterday.  No overt GI bleeding.    General: NAD Neck: JVP 10 cm, no thyromegaly or thyroid nodule.  Lungs: Clear to auscultation bilaterally with normal respiratory effort. CV: Nondisplaced PMI.  Heart regular S1/S2, no S3/S4, 3/6 SEM with some obscuring of S2.  No peripheral edema.   Abdomen: Soft, nontender, no hepatosplenomegaly, no distention.  Skin: Intact without lesions or rashes.  Neurologic: Alert and oriented x 3.  Psych: Normal affect. Extremities: No clubbing or cyanosis.  HEENT: Normal.   Still with volume overload on exam, creatinine elevated but stable.  Continue Lasix 80 mg IV bid, will give metolazone 2.5 again with pm Lasix.  Follow creatinine closely.     Still with severe PAH, continue Adempas and Uptravi.    Hgb 6.8. She had deep endoscopy last month without bleeding source found, not sure that repeat study is going to be helpful.  I will give her 1 unit PRBCs today.   Difficult situation.  I am concerned that worsening valvular heart disease (moderate-severe AS and moderate-severe MS on echo this admission) may have triggered worsening cardiorenal syndrome and volume overload.  Poor candidate for surgical valve replacement with chronic anemia, severe pulmonary hypertension and advanced renal failure.  - TEE today to more carefully assess valves.  Discussed risks/benefits with patient and she agrees to study.   - If aortic valve looks like it is the main problem, TAVR may be an option.  However, I do not think that she would be a mitral valvuloplasty candidate (would have to be surgical replacement).  Loralie Champagne 07/21/2021 11:30 AM

## 2021-07-21 NOTE — Progress Notes (Addendum)
Advanced Heart Failure Rounding Note  PCP-Cardiologist: Dr. Aundra Dubin   Subjective:    -1L yesterday with IV lasix 80 mg BID. However, weight down another 2 lb. Reports good output.   Short of breath with minimal exertion. O2 sats stable on 4L [place]  Scr stable at 3.5 yesterday. No BMET this am, STAT draw pending.  Hgb 6.8 this am, receiving 1 U PRBCs  Going for TEE today to better assess severity of AS and MS  RHC Procedural Findings: Hemodynamics (mmHg) RA mean 10 RV 86/14 PA 90/29, mean 53 PCWP mean 20  Oxygen saturations: PA 61% AO 95%  Cardiac Output (Fick) 6.93  Cardiac Index (Fick) 3.89 PVR 4.8 WU   Objective:   Weight Range: 72.1 kg Body mass index is 26.45 kg/m.   Vital Signs:   Temp:  [97.5 F (36.4 C)-98.4 F (36.9 C)] 97.9 F (36.6 C) (11/09 0430) Pulse Rate:  [72-76] 72 (11/09 0430) Resp:  [18-19] 19 (11/08 1932) BP: (114-132)/(47-96) 128/53 (11/09 0430) SpO2:  [94 %-98 %] 94 % (11/09 0430) Weight:  [72.1 kg] 72.1 kg (11/09 0430) Last BM Date: 07/20/21  Weight change: Filed Weights   07/19/21 0310 07/20/21 0409 07/21/21 0430  Weight: 70.6 kg 69.8 kg 72.1 kg    Intake/Output:   Intake/Output Summary (Last 24 hours) at 07/21/2021 0752 Last data filed at 07/21/2021 0500 Gross per 24 hour  Intake 720 ml  Output 1150 ml  Net -430 ml      Physical Exam    General:  Lying in bed. No acute distress.  HEENT: Normal Neck: Supple. JVD 10 cm . Carotids 2+ bilat; no bruits. No lymphadenopathy or thyromegaly appreciated. Cor: PMI nondisplaced. Regular rate & rhythm. 3/6 SEM RUSB Lungs: Diminished Abdomen: Soft, nontender, nondistended. No hepatosplenomegaly.  Extremities: No cyanosis, clubbing, rash, edema Neuro: Alert & orientedx3, cranial nerves grossly intact. moves all 4 extremities w/o difficulty. Affect pleasant   Telemetry   NSR 70s-80s, personally reviewed   Labs    CBC Recent Labs    07/20/21 0428 07/21/21 0259  WBC 8.6  7.9  HGB 8.1* 6.8*  HCT 26.5* 22.3*  MCV 87.2 88.5  PLT 345 932   Basic Metabolic Panel Recent Labs    07/19/21 0103 07/19/21 1513 07/20/21 0428  NA 136 139  140 138  K 3.3* 3.6  3.5 3.7  CL 109  --  108  CO2 18*  --  17*  GLUCOSE 141*  --  137*  BUN 89*  --  83*  CREATININE 3.43*  --  3.45*  CALCIUM 8.2*  --  8.5*  MG  --   --  2.1   Liver Function Tests No results for input(s): AST, ALT, ALKPHOS, BILITOT, PROT, ALBUMIN in the last 72 hours. No results for input(s): LIPASE, AMYLASE in the last 72 hours. Cardiac Enzymes No results for input(s): CKTOTAL, CKMB, CKMBINDEX, TROPONINI in the last 72 hours.  BNP: BNP (last 3 results) Recent Labs    06/18/21 1225 07/17/21 0336 07/19/21 0103  BNP 986.2* 1,247.8* 1,758.6*    ProBNP (last 3 results) Recent Labs    01/26/21 1313  PROBNP 823.0*     D-Dimer No results for input(s): DDIMER in the last 72 hours. Hemoglobin A1C No results for input(s): HGBA1C in the last 72 hours. Fasting Lipid Panel No results for input(s): CHOL, HDL, LDLCALC, TRIG, CHOLHDL, LDLDIRECT in the last 72 hours. Thyroid Function Tests No results for input(s): TSH, T4TOTAL, T3FREE, THYROIDAB in  the last 72 hours.  Invalid input(s): FREET3  Other results:   Imaging    No results found.   Medications:     Scheduled Medications:  sodium chloride   Intravenous Once   atorvastatin  20 mg Oral Daily   furosemide  80 mg Intravenous BID   levothyroxine  125 mcg Oral QAC breakfast   metoprolol succinate  25 mg Oral BID   pantoprazole  40 mg Oral BID   Riociguat  2.5 mg Oral TID   Selexipag  1,600 mcg Oral BID   Selexipag  800 mcg Oral BID   sodium bicarbonate  650 mg Oral TID   sodium chloride flush  3 mL Intravenous Q12H   sodium chloride flush  3 mL Intravenous Q12H   sodium chloride flush  3 mL Intravenous Q12H    Infusions:  sodium chloride     sodium chloride      PRN Medications: sodium chloride, acetaminophen,  ondansetron (ZOFRAN) IV, sodium chloride flush   Assessment/Plan   Assessment/Plan: 1. Pulmonary hypertension with RV failure:  Severe PAH with PVR 7 WU on 1/19 RHC.  Etiology uncertain.  She has a history of DVT remotely but V/Q scan in 2/19 did not show evidence for chronic PE. She has OSA and uses CPAP.  Serologic workup showed only positive ANA but when additional serologies were done, they were all negative.  CT chest with no ILD or emphysema.  Valvular heart disease does not appear significant enough to cause this degree of PH.  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  Echo in 4/19 showed that the RV is moderately dilated with mildly decreased systolic function, PA systolic pressure estimation was only 36 mmHg but poor envelope and likely underestimated. She did not tolerate Opsumit or ambrisentan due to significant lower extremity edema and worsening dyspnea.  [place] in 4/20 showed severe pulmonary hypertension with normal filling pressures and preserved cardiac output.  Echo in 4/21 showed normal RV with PASP 50 mmHg, echo 4/22 showed normal RV with PASP estimated 41 mmHg. TEE in 4/22 showed D-shaped septum with mild RV enlargement/mildly decreased RV systolic function.  At baseline, has oxygen for exertion.  Echo this admission with D-shaped septum, moderate RVE and moderate RV dysfunction, also concerning for worsening valvular HD.  RHC this admit showed mild-moderately elevated right and left filling pressures, preserved cardiac output and severe primarily pulmonary arterial hypertension. PVR 4.8 WU. 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  - Continue Adempas 2.5 mg tid.  - Continue Uptravi 2400 mcg BID.   2. Acute on chronic diastolic CHF: In setting of pulmonary hypertension and valvular disease.  Worsened symptoms with volume overload on exam.  Unfortunately, also appears to have developed progressive cardiorenal syndrome. RHC showed mild-moderately elevated right and left filling pressures, preserved  cardiac output, mRA 10, mPCWP 20, CI 3.89  - continue IV Lasix 80 mg bid. Is/Os reflect only 1L output but reports good diuresis. Weight down another 2 lb. STAT bmet this am. 3. Syncope: Suspect related to severe pulmonary hypertension.  None recently.   4. 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  With heavy calcification, I do not think that she would be a mitral valvuloplasty candidate.  AS also has been moderate in the past.  Echo this admission reviewed, I am worried for progressive valvular heart disease leading to worsening volume status as well as worsening cardiorenal syndrome.  Echo this admission with moderate-severe  aortic stenosis (mean gradient 34 mmHg with AVA around 1.0 cm^2) and moderate-severe mitral stenosis (mean gradient 10 mmHg).  - TEE planned for this afternoon - Could potentially undergo TAVR for aortic valve but MV looks too calcified for valvuloplasty.  She would be a very high risk surgical patient.  Our options are likely going to be limited.  5. H/o DVT: No chronic PE on prior V/Q scan.   6. OSA: Continue CPAP qHS.  7. AKI on CKD Stage 3: Prior baseline creatinine has been around 1.6, recently with significant increase.  Admitted with creatinine at 4.4, now down to 3.45.  Concern for progressive cardiorenal syndrome possibly with worsening valvular HD.  8. H/o GI bleeding: Recurrent.  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 Hgb down to 7.4 this admit. Down further to 6.8 this am. To get 1 U pRBCs - Would involve GI, had deep endoscopy in 10/22 without source of bleeding found.  - Iron deficient. Had been receiving IV iron through [facility] consulted  PT/OT, CR   Length of Stay: [place], [place], PA-C  07/21/2021, 7:52 AM  Advanced Heart Failure Team Pager [phone] (M-F; 7a - 5p)  Please contact [facility] for night-coverage after hours (5p -7a ) and weekends on  [email or web address]  Patient seen with PA, agree with the above note.   Filling pressures elevated on RHC this admission but not markedly elevated.  Still with severe PAH.     Diuresis with weight down another 2 lbs though not reflected in I/Os.  Creatinine stable at 3.45 => 3.4.     Still short of breath walking to bathroom.    Hgb 6.8 today, down from yesterday.  No overt GI bleeding.    General: NAD Neck: JVP 10 cm, no thyromegaly or thyroid nodule.  Lungs: Clear to auscultation bilaterally with normal respiratory effort. CV: Nondisplaced PMI.  Heart regular S1/S2, no S3/S4, 3/6 SEM with some obscuring of S2.  No peripheral edema.   Abdomen: Soft, nontender, no hepatosplenomegaly, no distention.  Skin: Intact without lesions or rashes.  Neurologic: Alert and oriented x 3.  Psych: Normal affect. Extremities: No clubbing or cyanosis.  HEENT: Normal.   Still with volume overload on exam, creatinine elevated but stable.  Continue Lasix 80 mg IV bid, will give metolazone 2.5 again with pm Lasix.  Follow creatinine closely.     Still with severe PAH, continue Adempas and Uptravi.    Hgb 6.8. She had deep endoscopy last month without bleeding source found, not sure that repeat study is going to be helpful.  I will give her 1 unit PRBCs today.   Difficult situation.  I am concerned that worsening valvular heart disease (moderate-severe AS and moderate-severe MS on echo this admission) may have triggered worsening cardiorenal syndrome and volume overload.  Poor candidate for surgical valve replacement with chronic anemia, severe pulmonary hypertension and advanced renal failure.  - TEE today to more carefully assess valves.  Discussed risks/benefits with patient and she agrees to study.   - If aortic valve looks like it is the main problem, TAVR may be an option.  However, I do not think that she would be a mitral valvuloplasty candidate (would have to be surgical replacement).  Loralie Champagne 07/21/2021 11:30 AM

## 2021-07-21 NOTE — Progress Notes (Signed)
  Echocardiogram Echocardiogram Transesophageal has been performed.  Alisha [name] 07/21/2021, 2:24 PM

## 2021-07-21 NOTE — Consult Note (Addendum)
HEART AND [place] VALVE TEAM  Cardiology Consultation:   [name] ID: Alisha [name] MRN: [id number]; DOB: 1941/04/20  Admit date: 07/16/2021 Date of Consult: 07/21/2021  Primary Care Provider: Elise Benne [facility] Cardiologist: Dr. Loralie Champagne, MD   [name] Profile:   Alisha [name] is a 80 y.o. female with a hx of DM2, HTN, PAD, hyperlipidemia, severe PAH, hx of remote DVT, prior syncope, moderate mitral stenosis, chronic diastolic HF with RV failure due to [place], HTN, OSA on CPAP, lung nodule, CKD III followed by nephrology, prior AVMs with blood loss anemia, and progressive aortic stenosis who is being seen today for Alisha evaluation of aortic stenosis and possible TAVR at Alisha request of Dr. Aundra Dubin.   History of Present Illness:   Alisha [name] is divorced and lives in an independent retirement community alone in [place]. She has three children, one who is at Alisha bedside and one who is listening via telephone today. She states that her health was good until about 2019 when she was referred to Dr. Aundra Dubin for DOE and chest pain found to have severe PAH as outlined below. She has also had intermittent issues with GI bleeding requiring iron and PRBC infusions over Alisha years. She follows with OP GI and hematology for this. She has also had CKD stage III for years and more recently has been having issues with worsening kidney function. She follows with nephrology. These seem to be her biggest concerns today. She states that she is able to perform her ADL/IADLs without issues such as SOB, chest pain, or dizziness. Her son states that last week she was driving to her own appointments. She tends to Alisha community garden in her retirement [place] with no issues. She will sometimes use a walker for ambulation but mainly for knee pain and stability assurance.   She was initially referred to Dr. Aundra Dubin in 09/2017 for persistent exertional dyspnea and hypoxemia  felt to be secondary to pulmonary hypertension. LHC/RHC at that time showed normal PCWP and severely elevated PA pressures consistent with severe group I PAH. High resolution chest CT with no ILD. She was started on pulmonary vasodilators and diuretics and underwent serological workup which was unrevealing. Echocardiogram from 11/2017 showed normal EF, mild MS, mild AS and moderately dilated RV. She has had issues with Fe deficiency anemia with + FOBT. EGD showed gastritis and c-scope showed diverticulosis and polyps 4/19 which she was ultimately treated with treated with IV Fe and no clear etiology for her bleeding. Echo from 4/21 showed EF 44-03%, grade 2 diastolic dysfunction, normal RV, PASP 50 mmHg, mild mitral stenosis with mean gradient 7 mmHg and MVA 2 cm^2 by PHT, and mild AS with a mean gradient measures 14.7 mmHg, peak gradient measures 24.2 mmHg with an AVA by VTI measures 1.34 cm.   She then saw Dr. Aundra Dubin 12/24/20 and her weight was up 10lbs from baseline. Echo at that time showed worsening aortic stenosis with a normal LVEF at 60-65%, G2DD, RV normal with PASP estimated 41 mmHg, moderate mitral stenosis with a mean gradient 7 mmHg with MVA by VTI 1.2 cm^2 and moderate aortic valve stenosis with an AVA by VTI measuring 1.06 cm2 and mean gradient at 27.0 mmHg. She was subsequently scheduled for TEE to better assess MS and AS which was performed 01/07/21 that confirmed moderate aortic valve stenosis with AVA by VTI measuring 1.31 cm, and mean gradient measures 26.0 mmHg.  More recently she has  been followed with OP nephrology who also helps with diuretic adjustments. Due to a recent increase in creatinine, her torsemide was reduced with a diuretic holiday on 07/09/21. She additionally had an issue with anemia and required a blood transfusion 07/15/21. Prior to transfusion, she was seen by GI and underwent an endoscopy that showed no AVMs and was essentially unrevealing. Unfortunately, she had  difficulty with urination in Alisha post transfusion setting with labs showing a creatinine at 4.37. At that time, she was instructed to come to Alisha ED for further evaluation. General cardiology was consulted and ultimately advanced heart failure team with Dr. Aundra Dubin. Echocardiogram 07/18/21 showed an LVEF at >75%, G2DD, ventricular septum is flattened in systole and diastole consistent with RV pressure and volume overload, mild to moderate mitral valve regurgitation, moderate to severe mitral stenosis with a mean mitral valve gradient is 10.0 mmHg, ticuspid valve regurgitation is mild to moderate and moderate aortic valve stenosis with a mean gradient measures 27.6 mmHg, peak gradient measures 44.8 mmHg and AVA by VTI measures 1.11 cm. [place] 07/19/21 showed mild to moderately elevated right and left heart filling pressures, preserved cardiac output and severe primarily pulmonary arterial hypertension. She then underwent a TEE 07/21/21 to better define her mitral and aortic stenosis.This showed an LVEF at 60 to 65%, moderate mitral stenosis with a mean mitral valve gradient is 6.0 mmHg with MVA 1.3 cm^2 by VTI, severe aortic valve stenosis with an AVA by VTI at 0.87 cm mean gradient measuring 46.0 mmHg.  Past Medical History:  Diagnosis Date   Aortic atherosclerosis ([place]) 08/30/2017   Aortic stenosis 08/30/2017   CAD (coronary artery disease), native coronary artery 08/30/2017   CHF (congestive heart failure) (HCC)    Chronic diastolic heart failure ([place]) 08/30/2017   Diabetes mellitus with peripheral vascular disease ([place]) 07/14/2014   Hypertensive heart disease without CHF 08/30/2017   Hypothyroidism (acquired) 08/30/2017   Pulmonary hypertension ([place]) 07/04/2017    Past Surgical History:  Procedure Laterality Date   ANKLE SURGERY     APPENDECTOMY     DILATION AND CURETTAGE OF UTERUS     RIGHT HEART CATH N/A 01/01/2019   Procedure: RIGHT HEART CATH;  Surgeon: Larey Dresser, MD;  Location: [place] [facility];  Service: Cardiovascular;  Laterality: N/A;   RIGHT HEART CATH N/A 07/19/2021   Procedure: RIGHT HEART CATH;  Surgeon: Larey Dresser, MD;  Location: [place] [facility];  Service: Cardiovascular;  Laterality: N/A;   RIGHT/LEFT HEART CATH AND CORONARY ANGIOGRAPHY N/A 09/27/2017   Procedure: RIGHT/LEFT HEART CATH AND CORONARY ANGIOGRAPHY;  Surgeon: Sherren Mocha, MD;  Location: [place] [facility];  Service: Cardiovascular;  Laterality: N/A;   TEE WITHOUT CARDIOVERSION N/A 01/07/2021   Procedure: TRANSESOPHAGEAL ECHOCARDIOGRAM (TEE);  Surgeon: Larey Dresser, MD;  Location: [facility] ENDOSCOPY;  Service: Cardiovascular;  Laterality: N/A;   Inpatient Medications: Scheduled Meds:  [MAR Hold] atorvastatin  20 mg Oral Daily   [MAR Hold] furosemide  80 mg Intravenous BID   [MAR Hold] levothyroxine  125 mcg Oral QAC breakfast   [MAR Hold] metolazone  2.5 mg Oral Once   [MAR Hold] metoprolol succinate  25 mg Oral BID   [MAR Hold] pantoprazole  40 mg Oral BID   [MAR Hold] potassium chloride  40 mEq Oral Once   [MAR Hold] Riociguat  2.5 mg Oral TID   [MAR Hold] Selexipag  1,600 mcg Oral BID   [MAR Hold] Selexipag  800 mcg Oral BID   [  MAR Hold] sodium bicarbonate  650 mg Oral TID   [MAR Hold] sodium chloride flush  3 mL Intravenous Q12H   [MAR Hold] sodium chloride flush  3 mL Intravenous Q12H   [MAR Hold] sodium chloride flush  3 mL Intravenous Q12H   Continuous Infusions:  [MAR Hold] sodium chloride     sodium chloride     PRN Meds: [MAR Hold] sodium chloride, [MAR Hold] acetaminophen, [MAR Hold] ondansetron (ZOFRAN) IV, [MAR Hold] sodium chloride flush  Allergies:    Allergies  Allergen Reactions   Other Palpitations    Surgical metal (stents, pins, plates) keeps pt from healing   Adhesive [Tape] Other (See Comments)    Irritates skin with rash   Macitentan Other (See Comments)    Lower extremity edema    Nickel Rash    Social History:   Social History    Socioeconomic History   Marital status: Divorced    Spouse name: Not on file   Number of children: Not on file   Years of education: Not on file   Highest education level: Not on file  Occupational History   Not on file  Tobacco Use   Smoking status: Never   Smokeless tobacco: Never  Vaping Use   Vaping Use: Never used  Substance and Sexual Activity   Alcohol use: Yes    Comment: Occasionally   Drug use: No   Sexual activity: Never  Other Topics Concern   Not on file  Social History Narrative   Not on file   Social Determinants of Health   Financial Resource Strain: Not on file  Food Insecurity: Not on file  Transportation Needs: Not on file  Physical Activity: Not on file  Stress: Not on file  Social Connections: Not on file  Intimate Partner Violence: Not on file    Family History:    Family History  Problem Relation Age of Onset   Diabetes Mother    Bleeding Disorder Mother    Heart disease Father    Alzheimer's disease Father    Diabetes Sister    Diabetes Brother    Heart disease Brother    Healthy Daughter    Healthy Son     ROS:  Please see Alisha history of present illness.   All other ROS reviewed and negative.     Physical Exam/Data:   Vitals:   07/21/21 1305 07/21/21 1410 07/21/21 1420 07/21/21 1430  BP: (!) 134/49 (!) (P) 95/41 (!) 119/44 (!) (P) 113/39  Pulse: 73  72   Resp: 16 (P) 20 (!) 21   Temp: 97.8 F (36.6 C) (!) (P) 97.4 F (36.3 C)    TempSrc: Temporal (P) Temporal    SpO2: 100%  97%   Weight:      Height:        Intake/Output Summary (Last 24 hours) at 07/21/2021 1443 Last data filed at 07/21/2021 1401 Gross per 24 hour  Intake 888 ml  Output 1350 ml  Net -462 ml   Last 3 Weights 07/21/2021 07/21/2021 07/21/2021  Weight (lbs) 151 lb 3.8 oz 151 lb 4.8 oz 158 lb 15.2 oz  Weight (kg) 68.6 kg 68.629 kg 72.1 kg     Body mass index is 25.17 kg/m.   General: Elderly, NAD Neck: Negative for carotid bruits. No  JVD Lungs:Clear to ausculation bilaterally. No wheezes, rales, or rhonchi. Breathing is unlabored. Cardiovascular: RRR with S1 S2. + murmur Abdomen: Soft, non-tender, non-distended. No obvious abdominal masses. Extremities: No edema.  Neuro: Alert and oriented. No focal deficits. No facial asymmetry. MAE spontaneously. Psych: Responds to questions appropriately with normal affect.    EKG:  Alisha EKG was personally reviewed and demonstrates: 07/18/21 NSR with HR 76bpm  Telemetry:  Telemetry was personally reviewed and demonstrates: 07/22/21 NSR with HR 60-70's  Relevant CV Studies:  TEE 07/21/21:    1. Left ventricular ejection fraction, by estimation, is 60 to 65%. Alisha  left ventricle has normal function. Alisha left ventricle has no regional  wall motion abnormalities. There is mild left ventricular hypertrophy.   2. Peak RV-RA gradient 40 mmHg. Right ventricular systolic function is  mildly reduced. Alisha right ventricular size is moderately enlarged.   3. Left atrial size was moderately dilated. No left atrial/left atrial  appendage thrombus was detected.   4. Right atrial size was moderately dilated.   5. 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 Mild to moderate  mitral valve regurgitation. Moderate mitral stenosis. Alisha mean mitral  valve gradient is 6.0 mmHg with MVA 1.3 cm^2 by VTI. Moderate to severe  mitral annular calcification.   6. Alisha aortic valve is tricuspid. Aortic valve regurgitation is mild.  Severe aortic valve stenosis. Aortic valve area, by VTI measures 0.87 cm.  Aortic valve mean gradient measures 46.0 mmHg.   7. A small pericardial effusion is present.   Echocardiogram 07/18/21:   Left ventricular ejection fraction, by estimation, is >75%. Alisha left ventricle has hyperdynamic function. Alisha left ventricle has no regional wall motion abnormalities. There is moderate left ventricular hypertrophy. Left ventricular diastolic parameters are consistent with Grade  II diastolic dysfunction (pseudonormalization). Elevated left atrial pressure. 1. Alisha ventricular septum is flattened in systole and diastole consistent with RV pressure and volume overload. Relatively preserved RV anular vertical motion however decreased free wall contraction in Alisha horizontal plane, TAPSE and S' likely poor indicators for RV function. . Right ventricular systolic function is moderately reduced. Alisha right ventricular size is moderately enlarged. Tricuspid regurgitation signal is inadequate for assessing PA pressure. 2. 3. Left atrial size was moderately dilated. 4. A small pericardial effusion is present. Alisha pericardial effusion is circumferential. Alisha mitral valve is abnormal. Mild to moderate mitral valve regurgitation. Moderate to severe mitral stenosis. Moderate mitral annular calcification. Alisha mean mitral valve gradient is 10.0 mmHg. 5. 6. Tricuspid valve regurgitation is mild to moderate. Alisha aortic valve is tricuspid. There is moderate calcification of Alisha aortic valve. There is moderate thickening of Alisha aortic valve. Aortic valve regurgitation is mild. Moderate aortic valve stenosis. 7. Alisha inferior vena cava is normal in size with <50% respiratory variability, suggesting right atrial pressure of 8 mmHg.  [place] 07/19/21:   Mild to moderately elevated right and left heart filling pressures.  2. Preserved cardiac output.  3. Severe primarily pulmonary arterial hypertension.    Suspect primarily group 1 [place] with component of group 2.   Echocardiogram 12/24/20:  Left ventricular ejection fraction, by estimation, is 60 to 65%. Alisha left ventricle has normal function. Alisha left ventricle has no regional wall motion abnormalities. There is moderate left ventricular hypertrophy. Left ventricular diastolic parameters are consistent with Grade II diastolic dysfunction (pseudonormalization). 2. Right ventricular systolic function is normal. Alisha right ventricular size  is normal. There is mildly elevated pulmonary artery systolic pressure. Alisha estimated right ventricular systolic pressure is 29.5 mmHg. 3. Left atrial size was severely dilated. 4. Right atrial size was mildly dilated. 5. Alisha mitral valve is degenerative. Trivial mitral valve regurgitation. Moderate mitral  stenosis, mean gradient 7 mmHg with MVA by VTI 1.2 cm^2. 6. Alisha aortic valve is tricuspid. Aortic valve regurgitation is trivial. Moderate aortic valve stenosis. Aortic valve area, by VTI measures 1.06 cm. Aortic valve mean gradient measures 27.0 mmHg. 7. Alisha inferior vena cava is normal in size with <50% respiratory variability, suggesting right atrial pressure of 8 mmHg.  Laboratory Data:  High Sensitivity Troponin:   Recent Labs  Lab 07/17/21 0336 07/17/21 0601  TROPONINIHS 17 18*     Chemistry Recent Labs  Lab 07/19/21 0103 07/19/21 1513 07/20/21 0428 07/21/21 0259  NA 136 139  140 138 138  K 3.3* 3.6  3.5 3.7 3.4*  CL 109  --  108 108  CO2 18*  --  17* 19*  GLUCOSE 141*  --  137* 113*  BUN 89*  --  83* 83*  CREATININE 3.43*  --  3.45* 3.40*  CALCIUM 8.2*  --  8.5* 8.2*  GFRNONAA 13*  --  13* 13*  ANIONGAP 9  --  13 11    Recent Labs  Lab 07/16/21 2004  PROT 6.4*  ALBUMIN 3.4*  AST 12*  ALT 9  ALKPHOS 54  BILITOT 0.6   Hematology Recent Labs  Lab 07/19/21 0103 07/19/21 1513 07/20/21 0428 07/21/21 0259  WBC 7.7  --  8.6 7.9  RBC 2.74*  --  3.04* 2.52*  HGB 7.4* 11.2*  7.5* 8.1* 6.8*  HCT 23.5* 33.0*  22.0* 26.5* 22.3*  MCV 85.8  --  87.2 88.5  MCH 27.0  --  26.6 27.0  MCHC 31.5  --  30.6 30.5  RDW 19.5*  --  19.5* 19.5*  PLT 287  --  345 298   BNP Recent Labs  Lab 07/17/21 0336 07/19/21 0103  BNP 1,247.8* 1,758.6*    DDimer No results for input(s): DDIMER in Alisha last 168 hours.   Radiology/Studies:  CARDIAC CATHETERIZATION  Result Date: 07/19/2021 1. Mild to moderately elevated right and left heart filling pressures. 2.  Preserved cardiac output. 3. Severe primarily pulmonary arterial hypertension. 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   DG CHEST PORT 1 VIEW  Result Date: 07/18/2021 CLINICAL DATA:  Dyspnea EXAM: PORTABLE CHEST 1 VIEW COMPARISON:  July 16, 2021 FINDINGS: Enlarged cardiac silhouette. Tortuosity and calcific atherosclerotic disease of Alisha aorta. Streaky bilateral airspace opacities with lower lobe predominance. Possible bilateral pleural effusions, small. Osseous structures are without acute abnormality. Soft tissues are grossly normal. IMPRESSION: 1. Streaky bilateral airspace opacities and increased interstitial markings may represent pulmonary edema, with lower lobe atelectasis and small pleural effusions. 2. Enlarged cardiac silhouette. Electronically Signed   By: Fidela Salisbury M.D.   On: 07/18/2021 12:12   ECHOCARDIOGRAM COMPLETE  Result Date: 07/18/2021    ECHOCARDIOGRAM REPORT   [name] Name:   Alisha [name] [facility] Date of Exam: 07/18/2021 Medical Rec #:  [id number]      Height:       65.0 in Accession #:    [id number]     Weight:       155.5 lb Date of Birth:  19-Nov-1940     BSA:          1.777 m [name] Age:    18 years       BP:           135/69 mmHg [name] Gender: F              HR:  95 bpm. Exam Location:  Inpatient Procedure: 2D Echo, Color Doppler and Cardiac Doppler Indications:    Dyspnea  History:        [name] has prior history of Echocardiogram examinations, most                 recent 01/07/2021. CHF, CAD, Pulmonary HTN; Risk                 Factors:Diabetes.  Sonographer:    Bernadene Person RDCS Referring Phys: [place]  1. Left ventricular ejection fraction, by estimation, is >75%. Alisha left ventricle has hyperdynamic function. Alisha left ventricle has no regional wall motion abnormalities. There is moderate left ventricular hypertrophy. Left ventricular diastolic parameters are consistent with Grade II diastolic dysfunction  (pseudonormalization). Elevated left atrial pressure.  2. Alisha ventricular septum is flattened in systole and diastole consistent with RV pressure and volume overload. Relatively preserved RV anular vertical motion however decreased free wall contraction in Alisha horizontal plane, TAPSE and S' likely poor indicators for RV function. . Right ventricular systolic function is moderately reduced. Alisha right ventricular size is moderately enlarged. Tricuspid regurgitation signal is inadequate for assessing PA pressure.  3. Left atrial size was moderately dilated.  4. A small pericardial effusion is present. Alisha pericardial effusion is circumferential.  5. Alisha mitral valve is abnormal. Mild to moderate mitral valve regurgitation. Moderate to severe mitral stenosis. Moderate mitral annular calcification. Alisha mean mitral valve gradient is 10.0 mmHg.  6. Tricuspid valve regurgitation is mild to moderate.  7. Alisha aortic valve is tricuspid. There is moderate calcification of Alisha aortic valve. There is moderate thickening of Alisha aortic valve. Aortic valve regurgitation is mild. Moderate aortic valve stenosis.  8. Alisha inferior vena cava is normal in size with <50% respiratory variability, suggesting right atrial pressure of 8 mmHg. FINDINGS  Left Ventricle: Left ventricular ejection fraction, by estimation, is >75%. Alisha left ventricle has hyperdynamic function. Alisha left ventricle has no regional wall motion abnormalities. Alisha left ventricular internal cavity size was normal in size. There is moderate left ventricular hypertrophy. Left ventricular diastolic parameters are consistent with Grade II diastolic dysfunction (pseudonormalization). Elevated left atrial pressure. Right Ventricle: Alisha ventricular septum is flattened in systole and diastole consistent with RV pressure and volume overload. Relatively preserved RV anular vertical motion however decreased free wall contraction in Alisha horizontal plane, TAPSE and S' likely poor  indicators for RV function. Alisha right ventricular size is moderately enlarged. Right vetricular wall thickness was not well visualized. Right ventricular systolic function is moderately reduced. Tricuspid regurgitation signal is inadequate for assessing PA pressure. Left Atrium: Left atrial size was moderately dilated. Right Atrium: Right atrial size was not well visualized. Pericardium: A small pericardial effusion is present. Alisha pericardial effusion is circumferential. Mitral Valve: Alisha mitral valve is abnormal. There is moderate thickening of Alisha mitral valve leaflet(s). There is moderate calcification of Alisha mitral valve leaflet(s). Moderate mitral annular calcification. Mild to moderate mitral valve regurgitation. Moderate to severe mitral valve stenosis. Alisha mean mitral valve gradient is 10.0 mmHg. Tricuspid Valve: Alisha tricuspid valve is not well visualized. Tricuspid valve regurgitation is mild to moderate. No evidence of tricuspid stenosis. Aortic Valve: Alisha aortic valve is tricuspid. There is moderate calcification of Alisha aortic valve. There is moderate thickening of Alisha aortic valve. There is moderate aortic valve annular calcification. Aortic valve regurgitation is mild. Aortic regurgitation PHT measures 314 msec. Moderate aortic stenosis is present. Aortic valve mean gradient  measures 27.6 mmHg. Aortic valve peak gradient measures 44.8 mmHg. Aortic valve area, by VTI measures 1.11 cm. Pulmonic Valve: Alisha pulmonic valve was not well visualized. Pulmonic valve regurgitation is not visualized. No evidence of pulmonic stenosis. Aorta: Alisha aortic root is normal in size and structure. Venous: Alisha inferior vena cava is normal in size with less than 50% respiratory variability, suggesting right atrial pressure of 8 mmHg. IAS/Shunts: Alisha interatrial septum was not well visualized.  LEFT VENTRICLE PLAX 2D LVIDd:         3.80 cm   Diastology LVIDs:         2.50 cm   LV e' medial:    4.45 cm/s LV PW:         1.30  cm   LV E/e' medial:  48.5 LV IVS:        1.20 cm   LV e' lateral:   5.35 cm/s LVOT diam:     1.90 cm   LV E/e' lateral: 40.4 LV SV:         82 LV SV Index:   46 LVOT Area:     2.84 cm  RIGHT VENTRICLE RV S prime:     11.20 cm/s TAPSE (M-mode): 1.6 cm LEFT ATRIUM             Index        RIGHT ATRIUM           Index LA diam:        5.10 cm 2.87 cm/m   RA Area:     14.60 cm LA Vol (A2C):   74.9 ml 42.14 ml/m  RA Volume:   34.40 ml  19.35 ml/m LA Vol (A4C):   78.0 ml 43.88 ml/m LA Biplane Vol: 75.8 ml 42.64 ml/m  AORTIC VALVE AV Area (Vmax):    1.17 cm AV Area (Vmean):   1.04 cm AV Area (VTI):     1.11 cm AV Vmax:           334.80 cm/s AV Vmean:          247.600 cm/s AV VTI:            0.740 m AV Peak Grad:      44.8 mmHg AV Mean Grad:      27.6 mmHg LVOT Vmax:         138.00 cm/s LVOT Vmean:        91.000 cm/s LVOT VTI:          0.290 m LVOT/AV VTI ratio: 0.39 AI PHT:            314 msec  AORTA Ao Root diam: 3.30 cm Ao Asc diam:  3.20 cm MITRAL VALVE MV Area (PHT): 3.65 cm     SHUNTS MV Mean grad:  10.0 mmHg    Systemic VTI:  0.29 m MV Decel Time: 208 msec     Systemic Diam: 1.90 cm MV E velocity: 216.00 cm/s MV A velocity: 145.00 cm/s MV E/A ratio:  1.49 Carlyle Dolly MD Electronically signed by Carlyle Dolly MD Signature Date/Time: 07/18/2021/3:50:11 PM    Final    ECHO TEE  Result Date: 07/21/2021    TRANSESOPHOGEAL ECHO REPORT   [name] Name:   Alisha [name] [facility] Date of Exam: 07/21/2021 Medical Rec #:  [id number]      Height:       65.0 in Accession #:    [id number]     Weight:       151.2 lb  Date of Birth:  1940/11/03     BSA:          1.757 m [name] Age:    15 years       BP:           105/60 mmHg [name] Gender: F              HR:           70 bpm. Exam Location:  Inpatient Procedure: Transesophageal Echo, Color Doppler, 3D Echo and Cardiac Doppler Indications:    AS, MS  History:        [name] has prior history of Echocardiogram examinations.  Sonographer:    Dustin Flock RDCS Referring  Phys: [phone] Wilmer Floor SIMMONS PROCEDURE: Alisha transesophogeal probe was passed without difficulty through Alisha esophogus of Alisha [name]. Sedation performed by different physician. Alisha [name] developed no complications during Alisha procedure. IMPRESSIONS  1. Left ventricular ejection fraction, by estimation, is 60 to 65%. Alisha left ventricle has normal function. Alisha left ventricle has no regional wall motion abnormalities. There is mild left ventricular hypertrophy.  2. Peak RV-RA gradient 40 mmHg. Right ventricular systolic function is mildly reduced. Alisha right ventricular size is moderately enlarged.  3. Left atrial size was moderately dilated. No left atrial/left atrial appendage thrombus was detected.  4. Right atrial size was moderately dilated.  5. 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 Mild to moderate mitral valve regurgitation. Moderate mitral stenosis. Alisha mean mitral valve gradient is 6.0 mmHg with MVA 1.3 cm^2 by VTI. Moderate to severe mitral annular calcification.  6. Alisha aortic valve is tricuspid. Aortic valve regurgitation is mild. Severe aortic valve stenosis. Aortic valve area, by VTI measures 0.87 cm. Aortic valve mean gradient measures 46.0 mmHg.  7. A small pericardial effusion is present. FINDINGS  Left Ventricle: Left ventricular ejection fraction, by estimation, is 60 to 65%. Alisha left ventricle has normal function. Alisha left ventricle has no regional wall motion abnormalities. Alisha left ventricular internal cavity size was normal in size. There is  mild left ventricular hypertrophy. Right Ventricle: Peak RV-RA gradient 40 mmHg. Alisha right ventricular size is moderately enlarged. No increase in right ventricular wall thickness. Right ventricular systolic function is mildly reduced. Left Atrium: Left atrial size was moderately dilated. No left atrial/left atrial appendage thrombus was detected. Right Atrium: Right atrial size was moderately dilated. Pericardium: A small pericardial  effusion is present. 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 There is moderate calcification of Alisha mitral valve leaflet(s). Moderate to severe mitral annular calcification. Mild to moderate mitral valve regurgitation. Moderate mitral valve stenosis. MV peak gradient, 16.2 mmHg. Alisha mean mitral valve gradient is 6.0 mmHg. Tricuspid Valve: Alisha tricuspid valve is normal in structure. Tricuspid valve regurgitation is mild. Aortic Valve: Alisha aortic valve is tricuspid. Aortic valve regurgitation is mild. Severe aortic stenosis is present. Aortic valve mean gradient measures 46.0 mmHg. Aortic valve peak gradient measures 74.1 mmHg. Aortic valve area, by VTI measures 0.87 cm. Pulmonic Valve: Alisha pulmonic valve was normal in structure. Pulmonic valve regurgitation is not visualized. Aorta: Alisha aortic root is normal in size and structure. IAS/Shunts: No atrial level shunt detected by color flow Doppler.  LEFT VENTRICLE PLAX 2D LVOT diam:     2.00 cm LV SV:         83 LV SV Index:   47 LVOT Area:     3.14 cm  AORTIC VALVE AV Area (Vmax):    0.89 cm AV  Area (Vmean):   0.80 cm AV Area (VTI):     0.87 cm AV Vmax:           430.50 cm/s AV Vmean:          303.500 cm/s AV VTI:            0.953 m AV Peak Grad:      74.1 mmHg AV Mean Grad:      46.0 mmHg LVOT Vmax:         122.00 cm/s LVOT Vmean:        76.900 cm/s LVOT VTI:          0.264 m LVOT/AV VTI ratio: 0.28 MITRAL VALVE               TRICUSPID VALVE MV Area (PHT): 1.83 cm    TR Peak grad:   39.7 mmHg MV Area VTI:   1.30 cm    TR Vmax:        315.00 cm/s MV Peak grad:  16.2 mmHg MV Mean grad:  6.0 mmHg    SHUNTS MV Vmax:       2.01 m/s    Systemic VTI:  0.26 m MV Vmean:      113.3 cm/s  Systemic Diam: 2.00 cm Dalton McleanMD Electronically signed by Franki Monte Signature Date/Time: 07/21/2021/2:35:24 PM    Final     STS Risk Calculator: Procedure: AV Replacement  Risk of Mortality: 4.373% Renal Failure: 25.738% Permanent Stroke: 2.703% Prolonged  Ventilation: 14.403% DSW Infection: 0.085% Reoperation: 4.650% Morbidity or Mortality: 36.733% Short Length of Stay: 19.198% Long Length of Stay: 12.655%  [facility] Cardiomyopathy Questionnaire  KCCQ-12 07/22/2021  1 a. Ability to shower/bathe Not at all limited  1 b. Ability to walk 1 block Slightly limited  1 c. Ability to hurry/jog Moderately limited  2. Edema feet/ankles/legs Less than once a week  3. Limited by fatigue Never over Alisha past 2 weeks  4. Limited by dyspnea Less than once a week  5. Sitting up / on 3+ pillows Never over Alisha past 2 weeks  6. Limited enjoyment of life Not limited at all  7. Rest of life w/ symptoms Mostly satisfied  8 a. Participation in hobbies Did not limit at all  8 b. Participation in chores Did not limit at all  8 c. Visiting family/friends Did not limit at all      Assessment and Plan:   Alisha [name] is a 80 y.o. female with symptoms of severe, stage D3 aortic stenosis with NYHA Class II symptoms. I have reviewed Alisha [name]'s recent echocardiogram which is notable for normal LV systolic function at >35% and severe aortic stenosis with peak gradient of 44.36mHg and mean transvalvular gradient of 27.65mg. Alisha [name]'s dimensionless index is 0.39 and calculated aortic valve area is 1.11cm. Alisha [name] subsequently underwent TEE to better define her aortic stenosis that showed severe AS with AVA by VTI at 0.87cm2, mean gradient at 46.30m56m with a peak gradient at 74.1mm830m Also noted was a heavily calcified rheumatic mitral valve with moderate MS with a gradient at 6mmH13mith MVA at 1.3cm2 by VTI.     I have reviewed Alisha natural history of aortic stenosis with Alisha [name]. We have discussed Alisha limitations of medical therapy and Alisha poor prognosis associated with symptomatic aortic stenosis. We have reviewed potential treatment options, including palliative medical therapy, conventional surgical aortic valve replacement, and transcatheter  aortic valve replacement. We discussed treatment options in Alisha context  of this [name]'s specific comorbid medical conditions.    Alisha [name]'s predicted risk of mortality with conventional aortic valve replacement is 4.373% primarily based on acute on chronic kidney disease stage IV, poorly defined blood loss anemia, and chronic diastolic CHF requiring high dose diuretics. Other significant comorbid conditions include severe pulmonary hypertension. Ms. Borelli is fairly functional at home with no significant shortness of breath or dyspnea. She has had more recent issues with GI bleeds requiring iron and PRBC infusions with no clear etiology and declining renal function. This places her at significant risk to proceed with pre-TAVR imaging and will include a significant risk for temporary or permanent dialysis. Additionally, she will be required to stay on lifelong ASA therapy in Alisha post TAVR setting, placing her at high risk for further GI bleeding complications. This was communicated with Alisha [name] and family at bedside. Dr. Burt Knack is to follow with final recommendations for proceeding versus continuing with medical palliation for her aortic stenosis.    For questions or updates, please contact [place] Please consult [email or web address] for contact info under    Signed, Kathyrn Drown, NP  07/21/2021 2:43 PM   [name] seen, examined. Available data reviewed. Agree with findings, assessment, and plan as outlined by Kathyrn Drown, NP. Alisha [name] is independently interviewed and examined. Her son is at Alisha bedside and her daughter is conferenced in over Alisha telephone. Alisha [name] has a history of pulmonary HTN maintained on chronic therapy with Adempas and Uptravi. She has undergone RHC initially in 2019 with a PCWP of 6 mmHg and mean PAP of 51 mmHg. Recent cath earlier this year with similar findings (PCWP 12, mean PAP 51). She has recently struggled with progressive heart failure, anemia, and worsening  renal function. She is now admitted and treated for heart failure. She has undergone echo and TEE studies during this admission. Pertinent findings from these studies include D-shaped septum, RV enlargement and a D-shaped septum consistent with her known hx of pulmonary HTN. Aortic stenosis has now progressed and is in Alisha severe range with moderately calcified and restricted leaflet motion, and TEE demonstrating a mean transvalvular gradient as high as 46 mmHg with calculated AVA 0.87. Alisha mitral valve annulus is severely calcified and there is moderate MS. Creatinine peaked at 4.3 and now is down to 3.2 (eGFR 14). HgB was as low as 6.8 and now is at 8.5 after PRBC transfusion. On my exam, she is alert, oriented, in NAD on O2 per [place]. HEENT normal, JVP elevated, lungs diminished in Alisha bases, heart RRR With 3/6 harsh mid to late peaking systolic murmur at Alisha RUSB, A2 present but diminished. Abd: soft, NT. Ext: no edema. I discussed Alisha natural hx of aortic stenosis in Alisha context of Alisha [name]'s multiple medical problems. We reviewed treatment options of TAVR versus palliative medical therapy. She wouldn't be a candidate for cardiac surgery under any circumstances. Unfortunately, with her kidney disease, pulmonary HTN, right heart failure, and GI bleeding, I do not think she would do well with TAVR evaluation and treatment. I think her risk of renal failure or significant bleeding would likely outweigh Alisha benefit of Alisha procedure. She has been clear about not wanting dialysis if renal failure were to occur, and I agree that she would do poorly with dialysis. After our discussion, we all agree that a palliative medical approach to her care is most appropriate.   Sherren Mocha, M.D. 07/22/2021 7:04 PM

## 2021-07-21 NOTE — NC FL2 (Signed)
[place] LEVEL OF CARE SCREENING TOOL     IDENTIFICATION  Patient Name: Alisha [name] Birthdate: December 11, 1940 Sex: female Admission Date (Current Location): 07/16/2021  [facility] and [place] Number:  Herbalist and Address:  [facility]. [place], [place] [address] [place], [place] [address]      Provider Number: [id number]  Attending Physician Name and Address:  Darliss Cheney, MD  Relative Name and Phone Number:       Current Level of Care: Hospital Recommended Level of Care: [place] Prior Approval Number:    Date Approved/Denied:   PASRR Number: [id number]  Discharge Plan: SNF    Current Diagnoses: Patient Active Problem List   Diagnosis Date Noted   Acute renal failure superimposed on stage 4 chronic kidney disease ([place]) 07/17/2021   Prolonged QT interval 07/17/2021   Gout 05/14/2021   High risk medication use 05/14/2021   AVM (arteriovenous malformation) of small bowel, 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   Iron deficiency anemia due to chronic blood loss 02/21/2018   Tubular adenoma of colon 01/03/2018   Thrombophlebitis of superficial veins of right lower extremity 12/18/2017   CAD (coronary artery disease), native coronary artery 08/30/2017   Dyspnea 08/30/2017   Aortic atherosclerosis ([place]) 08/30/2017   Hypertensive heart disease without CHF 08/30/2017   Aortic stenosis 08/30/2017   Hypothyroidism (acquired) 08/30/2017   Chronic diastolic heart failure ([place]) 08/30/2017   Fungal dermatitis 07/06/2017   Pulmonary hypertension, unspecified ([place]) 07/04/2017   Cataract, nuclear sclerotic, left eye 38/18/2993   Diastolic dysfunction 71/69/6789   Non-rheumatic mitral valve stenosis 05/17/2016   LAE (left atrial enlargement) 05/17/2016   Chronic venous insufficiency 04/11/2016   Diabetes mellitus with peripheral vascular disease ([place]) 07/14/2014   Type II or unspecified type diabetes mellitus with peripheral circulatory  disorders, not stated as uncontrolled(250.70) 06/05/2014   PAD (peripheral artery disease) ([place]) 05/20/2014   GERD (gastroesophageal reflux disease) 11/12/2012   Chronic kidney disease, stage III (moderate) ([place]) 04/19/2010    Orientation RESPIRATION BLADDER Height & Weight     Self, Time, Situation, Place  O2 (4L) Continent, External catheter Weight: 151 lb 3.8 oz (68.6 kg) Height:  5\' 5"  (165.1 cm)  BEHAVIORAL SYMPTOMS/MOOD NEUROLOGICAL BOWEL NUTRITION STATUS      Continent Diet (Please see DC Summary)  AMBULATORY STATUS COMMUNICATION OF NEEDS Skin   Limited Assist Verbally Normal                       Personal Care Assistance Level of Assistance  Bathing, Feeding, Dressing Bathing Assistance: Limited assistance Feeding assistance: Limited assistance Dressing Assistance: Limited assistance     Functional Limitations Info  Sight, Hearing, Speech Sight Info: Impaired Hearing Info: Impaired Speech Info: Adequate    SPECIAL CARE FACTORS FREQUENCY  PT (By licensed PT), OT (By licensed OT)     PT Frequency: 5x/week OT Frequency: 5x/week            Contractures Contractures Info: Not present    Additional Factors Info  Code Status, Allergies Code Status Info: DNR Allergies Info: Other, Adhesive (Tape), Macitentan, Nickel           Current Medications (07/21/2021):  This is the current hospital active medication list Current Facility-Administered Medications  Medication Dose Route Frequency Provider Last Rate Last Admin   0.9 %  sodium chloride infusion  250 mL Intravenous PRN Larey Dresser, MD       acetaminophen (TYLENOL) tablet 650 mg  650 mg Oral Q4H PRN Larey Dresser, MD       atorvastatin (LIPITOR) tablet 20 mg  20 mg Oral Daily Opyd, Ilene Qua, MD   20 mg at 07/21/21 0825   furosemide (LASIX) injection 80 mg  80 mg Intravenous BID Larey Dresser, MD   80 mg at 07/21/21 0825   levothyroxine (SYNTHROID) tablet 125 mcg  125 mcg Oral QAC breakfast  Vianne Bulls, MD   125 mcg at 07/21/21 0544   metolazone (ZAROXOLYN) tablet 2.5 mg  2.5 mg Oral Once Larey Dresser, MD       metoprolol succinate (TOPROL-XL) 24 hr tablet 25 mg  25 mg Oral BID Charlynne Cousins, MD   25 mg at 07/21/21 0826   ondansetron (ZOFRAN) injection 4 mg  4 mg Intravenous Q6H PRN Larey Dresser, MD       pantoprazole (PROTONIX) EC tablet 40 mg  40 mg Oral BID Vianne Bulls, MD   40 mg at 07/21/21 2585   Riociguat TABS 2.5 mg  2.5 mg Oral TID Arnoldo Lenis, MD   2.5 mg at 07/21/21 0827   Selexipag TABS 1,600 mcg  1,600 mcg Oral BID Vianne Bulls, MD   1,600 mcg at 07/21/21 0828   Selexipag TABS 800 mcg  800 mcg Oral BID Vianne Bulls, MD   800 mcg at 07/21/21 2778   sodium bicarbonate tablet 650 mg  650 mg Oral TID Vianne Bulls, MD   650 mg at 07/21/21 1542   sodium chloride flush (NS) 0.9 % injection 3 mL  3 mL Intravenous Q12H Opyd, Ilene Qua, MD   3 mL at 07/20/21 0924   sodium chloride flush (NS) 0.9 % injection 3 mL  3 mL Intravenous Q12H Larey Dresser, MD   3 mL at 07/21/21 0829   sodium chloride flush (NS) 0.9 % injection 3 mL  3 mL Intravenous Q12H Larey Dresser, MD   3 mL at 07/21/21 0829   sodium chloride flush (NS) 0.9 % injection 3 mL  3 mL Intravenous PRN Larey Dresser, MD         Discharge Medications: Please see discharge summary for a list of discharge medications.  Relevant Imaging Results:  Relevant Lab Results:   Additional Information SSN#: [id number] Levan Hurst COVID-19 Vaccine 08/04/2020 Pfizer Covid-19 Vaccine Bivalent Booster 06/01/2021  Sasan Wilkie, [employer]

## 2021-07-21 NOTE — Progress Notes (Signed)
PROGRESS NOTE    TIAHNA CURE  ZOX:[id number] DOB: 05/13/1941 DOA: 07/16/2021 PCP: Mackie Pai, PA-C   Brief Narrative:  Alisha [name] is an 80 y.o. 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  She relates that she had her diuretic recently increased as she was becoming short of breath and requiring oxygen.  Upon arrival to the ED she was satting in the low 90s blood pressure 117/58 bicarb of 13 creatinine of 4 with a BUN of 97 (with a baseline creatinine of 1.3-1.5)   Significant studies: RHC in 4/20 showed normal filling pressures but severe pulmonary hypertension with PVR 6 WU.  Echo showed moderately dilated RV with normal systolic function, mild-moderate MS, mild AS.   Right heart cath on 07/19/2021 showed elevated right and left heart filling pressure preserved cardiac output severe primary arterial hypertension   TEE 07/19/2021 pending   TEE was done in 4/22 to more closely assess the mitral and aortic valves, EF 60-65%, d-shaped septum, mild RV enlargement with mildly decreased RV systolic function, peak RV-RA gradient 38 mmHg, moderate AS with AVA 1.31 cm^2 mean gradient 26 mmHg, mild-moderate MR with mild-moderate MS mean gradient 7 with MVA 1.7 cm^2 by VTI .    Assessment & Plan:   Principal Problem:   Acute renal failure superimposed on stage 4 chronic kidney disease (HCC) Active Problems:   Pulmonary hypertension, unspecified (HCC)   CAD (coronary artery disease), native coronary artery   Chronic diastolic heart failure (HCC)   AVM (arteriovenous malformation) of small bowel, acquired   Prolonged QT interval  Acute renal failure superimposed on stage 4 chronic kidney disease/anion gap metabolic acidosis: Baseline creatinine seems to be around 1.8-1.9.  Presented with  4.3. Likely prerenal history  in the setting of torsemide and metolazone use. This was held she was given gentle IV fluid hydration.  Creatinine improved to 3.4, unfortunately she developed pulmonary edema seen on chest x-ray and BNP of 1700. Cardiology was consulted recommended right heart cath with results as below.  She is on bicarb tablets.  Acidosis improving.  CO2 19 today.  Hypokalemia: 3.4.  Will replace.   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  Echo this admission with moderate-severe aortic stenosis (mean gradient 34 mmHg with AVA around 1.0 cm^2) and moderate-severe mitral stenosis (mean gradient 10 mmHg). Could potentially undergo TAVR for aortic valve but MV looks too calcified for valvuloplasty.  She would be a very high risk surgical patient Continue Adempas 2.5 mg tid.  Continue Uptravi 2400 mcg BID Unfortunately right heart cath showed elevated right and left filling pressures with preserved cardiac output. Cardiology recommended to continue IV Lasix and currently she is on 80 mg IV twice daily.  TEE scheduled today.  She was currently on 4 L.  Not taking deep breaths even when I asked her to, perhaps atelectasis also causing some hypoxia.  Incentive spirometry was at the bedside but she is not using it.  I highly encouraged her to use that.   Normocytic chronic blood loss anemia in the setting of chronic GI blood loss: She has a history of AVMs.  She has had received 1 packed red blood cells  on 07/15/2021. Her hemoglobin continues to slowly drift down, went down to 6.8 this morning.  1 unit of PRBC transfusion ordered by night hospitalist. Not a good candidate for anticoagulation   Essential hypertension: Blood pressure was low but now within normal range, home dose  of amlodipine on hold but continue Toprol-XL.   Prolonged QTC: Monitor on telemetry mag level pending.  Cardiology on board.  History of mitral valve stenosis: 2D echo as above.   DVT prophylaxis: SCD's Start: 07/19/21 1439 SCDs Start: 07/17/21 0622   Code Status: Full Code  Family Communication: Daughter at bedside.  Plan of care discussed with patient and daughter.  Status is: Inpatient  Remains inpatient appropriate because: Needs further work-up.  Estimated body mass index is 25.17 kg/m as calculated from the following:   Height as of this encounter: 5\' 5"  (1.651 m).   Weight as of this encounter: 68.6 kg.     Nutritional Assessment: Body mass index is 25.17 kg/m.Marland Kitchen Seen by dietician.  I agree with the assessment and plan as outlined below: Nutrition Status:        .  Skin Assessment: I have examined the patient's skin and I agree with the wound assessment as performed by the wound care RN as outlined below:    Consultants:  Cardiology  Procedures:  As above  Antimicrobials:  Anti-infectives (From admission, onward)    None          Subjective: Patient seen and examined this morning.  She still complains of shortness of breath even with minimal exertion in the bed.  Daughter at the bedside.  She has no other complaints.  Objective: Vitals:   07/21/21 1012 07/21/21 1052 07/21/21 1302 07/21/21 1305  BP: (!) 109/51 109/68 (!) 137/52 (!) 134/49  Pulse: 74 76 74 73  Resp: 19 18 18 16   Temp: 98.5 F (36.9 C) 98.6 F (37 C) 97.7 F (36.5 C) 97.8 F (36.6 C)  TempSrc: Oral Oral Temporal Temporal  SpO2: 94% 94% 99% 100%  Weight:   68.6 kg   Height:   5\' 5"  (1.651 m)     Intake/Output Summary (Last 24 hours) at 07/21/2021 1358 Last data filed at 07/21/2021 1300 Gross per 24 hour  Intake 588 ml  Output 1350 ml  Net -762 ml   Filed Weights   07/21/21 0430 07/21/21 0811 07/21/21 1302  Weight: 72.1 kg 68.6 kg 68.6 kg    Examination:  General  exam: Appears calm and comfortable  Respiratory system: Diminished breath sounds due to poor inspiratory effort Cardiovascular system: S1 & S2 heard, RRR. No JVD, murmurs, rubs, gallops or clicks. No pedal edema. Gastrointestinal system: Abdomen is nondistended, soft and nontender. No organomegaly or masses felt. Normal bowel sounds heard. Central nervous system: Alert and oriented. No focal neurological deficits. Extremities: Symmetric 5 x 5 power. Skin: No rashes, lesions or ulcers Psychiatry: Judgement and insight appear normal. Mood & affect appropriate.    Data Reviewed: I have personally reviewed following labs and imaging studies  CBC: Recent Labs  Lab 07/16/21 2004 07/18/21 0727 07/19/21 0103 07/19/21 1513 07/20/21 0428 07/21/21 0259  WBC 8.6 6.6 7.7  --  8.6 7.9  NEUTROABS 7.2  --   --   --   --   --   HGB 8.8* 7.8* 7.4* 11.2*  7.5* 8.1* 6.8*  HCT 28.0* 24.5* 23.5* 33.0*  22.0* 26.5* 22.3*  MCV 86.2 85.1 85.8  --  87.2 88.5  PLT 339 296 287  --  345 161   Basic Metabolic Panel: Recent Labs  Lab 07/17/21 1114 07/18/21 0342 07/19/21 0103 07/19/21 1513 07/20/21 0428 07/21/21 0259  NA 136 137 136 139  140 138 138  K 3.6 3.7 3.3* 3.6  3.5 3.7 3.4*  CL 108 111 109  --  108 108  CO2 15* 14* 18*  --  17* 19*  GLUCOSE 225* 131* 141*  --  137* 113*  BUN 93* 89* 89*  --  83* 83*  CREATININE 4.05* 3.78* 3.43*  --  3.45* 3.40*  CALCIUM 8.0* 8.3* 8.2*  --  8.5* 8.2*  MG  --  1.9  --   --  2.1  --    GFR: Estimated Creatinine Clearance: 13.3 mL/min (A) (by C-G formula based on SCr of 3.4 mg/dL (H)). Liver Function Tests: Recent Labs  Lab 07/16/21 2004  AST 12*  ALT 9  ALKPHOS 54  BILITOT 0.6  PROT 6.4*  ALBUMIN 3.4*   No results for input(s): LIPASE, AMYLASE in the last 168 hours. No results for input(s): AMMONIA in the last 168 hours. Coagulation Profile: No results for input(s): INR, PROTIME in the last 168 hours. Cardiac Enzymes: No results for  input(s): CKTOTAL, CKMB, CKMBINDEX, TROPONINI in the last 168 hours. BNP (last 3 results) Recent Labs    01/26/21 1313  PROBNP 823.0*   HbA1C: No results for input(s): HGBA1C in the last 72 hours. CBG: No results for input(s): GLUCAP in the last 168 hours. Lipid Profile: No results for input(s): CHOL, HDL, LDLCALC, TRIG, CHOLHDL, LDLDIRECT in the last 72 hours. Thyroid Function Tests: No results for input(s): TSH, T4TOTAL, FREET4, T3FREE, THYROIDAB in the last 72 hours. Anemia Panel: Recent Labs    07/20/21 1752  FERRITIN 60  TIBC 189*  IRON 27*   Sepsis Labs: No results for input(s): PROCALCITON, LATICACIDVEN in the last 168 hours.  Recent Results (from the past 240 hour(s))  Resp Panel by RT-PCR (Flu A&B, Covid) Nasopharyngeal Swab     Status: None   Collection Time: 07/17/21  3:05 AM   Specimen: Nasopharyngeal Swab; Nasopharyngeal(NP) swabs in vial transport medium  Result Value Ref Range Status   SARS Coronavirus 2 by RT PCR NEGATIVE NEGATIVE Final    Comment: (NOTE) SARS-CoV-2 target nucleic acids are NOT DETECTED.  The SARS-CoV-2 RNA is generally detectable in upper respiratory specimens during the acute phase of infection. The lowest concentration of SARS-CoV-2 viral copies this assay can detect is 138 copies/mL. A negative result does not preclude SARS-Cov-2 infection and should not be used as the sole basis for treatment or other patient management decisions. A negative result may occur with  improper specimen collection/handling, submission of specimen other than nasopharyngeal swab, presence of viral mutation(s) within the areas targeted by this assay, and inadequate number of viral copies(<138 copies/mL). A negative result must be combined with clinical observations, patient history, and epidemiological information. The expected result is Negative.  Fact Sheet for Patients:  EntrepreneurPulse.com.au  Fact Sheet for Healthcare Providers:   IncredibleEmployment.be  This test is no t yet approved or cleared by the [place] FDA and  has been authorized for detection and/or diagnosis of SARS-CoV-2 by FDA under an Emergency Use Authorization (EUA). This EUA will remain  in effect (meaning this test can be used) for the duration of the COVID-19 declaration under Section 564(b)(1) of the Act, 21 U.S.C.section 360bbb-3(b)(1), unless the authorization is terminated  or revoked sooner.       Influenza A by  PCR NEGATIVE NEGATIVE Final   Influenza B by PCR NEGATIVE NEGATIVE Final    Comment: (NOTE) The Xpert Xpress SARS-CoV-2/FLU/RSV plus assay is intended as an aid in the diagnosis of influenza from Nasopharyngeal swab specimens and should not be used as a sole basis for treatment. Nasal washings and aspirates are unacceptable for Xpert Xpress SARS-CoV-2/FLU/RSV testing.  Fact Sheet for Patients: EntrepreneurPulse.com.au  Fact Sheet for Healthcare Providers: IncredibleEmployment.be  This test is not yet approved or cleared by the [place] FDA and has been authorized for detection and/or diagnosis of SARS-CoV-2 by FDA under an Emergency Use Authorization (EUA). This EUA will remain in effect (meaning this test can be used) for the duration of the COVID-19 declaration under Section 564(b)(1) of the Act, 21 U.S.C. section 360bbb-3(b)(1), unless the authorization is terminated or revoked.  Performed at [facility], [place] [address] [place], [place] [address]       Radiology Studies: CARDIAC CATHETERIZATION  Result Date: 07/19/2021 1. Mild to moderately elevated right and left heart filling pressures. 2. Preserved cardiac output. 3. Severe primarily pulmonary arterial hypertension. 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    Scheduled Meds:  [MAR Hold] atorvastatin  20 mg Oral Daily   [MAR Hold] furosemide  80 mg Intravenous BID    [MAR Hold] levothyroxine  125 mcg Oral QAC breakfast   [MAR Hold] metolazone  2.5 mg Oral Once   [MAR Hold] metoprolol succinate  25 mg Oral BID   [MAR Hold] pantoprazole  40 mg Oral BID   [MAR Hold] potassium chloride  40 mEq Oral Once   [MAR Hold] Riociguat  2.5 mg Oral TID   [MAR Hold] Selexipag  1,600 mcg Oral BID   [MAR Hold] Selexipag  800 mcg Oral BID   [MAR Hold] sodium bicarbonate  650 mg Oral TID   [MAR Hold] sodium chloride flush  3 mL Intravenous Q12H   [MAR Hold] sodium chloride flush  3 mL Intravenous Q12H   [MAR Hold] sodium chloride flush  3 mL Intravenous Q12H   Continuous Infusions:  [MAR Hold] sodium chloride     sodium chloride       LOS: 4 days   Time spent: 38 minutes   Darliss Cheney, MD [facility]  07/21/2021, 1:58 PM  Please page via [place] and do not message via secure chat for anything urgent. Secure chat can be used for anything non urgent.  How to contact the [facility] Attending or Consulting provider [place] or covering provider during after hours [place], for this patient?  Check the care team in [facility] and look for a) attending/consulting [facility] provider listed and b) the [facility] team listed. Page or secure chat [phone]. Log into [email or web address] and use [place]'s universal password to access. If you do not have the password, please contact the hospital operator. Locate the [facility] provider you are looking for under [facility] and page to a number that you can be directly reached. If you still have difficulty reaching the provider, please page the [facility] (Director on Call) for the Hospitalists listed on amion for assistance.

## 2021-07-21 NOTE — Progress Notes (Signed)
[place] )[facility]) Hospital Liaison Note  Notified by [facility] manager Edwin Cap of patient/family request for [facility] palliative services at LTC after discharge.   [facility] hospital liaison will follow patient for discharge disposition.   Please call with any hospice or outpatient palliative care related questions.   Thank you for the opportunity to participate in the patient's care.   Zollie Pee [facility] Liaison [phone]

## 2021-07-21 NOTE — Care Management Important Message (Signed)
Important Message  Patient Details  Name: Alisha [name] MRN: [id number] Date of Birth: 1941/02/08   Medicare Important Message Given:  Yes     Shelda Altes 07/21/2021, 10:36 AM

## 2021-07-21 NOTE — Transfer of Care (Signed)
Immediate Anesthesia Transfer of Care Note  Patient: Alisha [name]  Procedure(s) Performed: TRANSESOPHAGEAL ECHOCARDIOGRAM (TEE)  Patient Location: Endoscopy Unit  Anesthesia Type:MAC  Level of Consciousness: awake, alert  and oriented  Airway & Oxygen Therapy: Patient Spontanous Breathing and Patient connected to face mask oxygen  Post-op Assessment: Report given to RN and Post -op Vital signs reviewed and stable  Post vital signs: Reviewed and stable  Last Vitals:  Vitals Value Taken Time  BP 95/41 07/21/21 1412  Temp    Pulse 73 07/21/21 1415  Resp 25 07/21/21 1415  SpO2 96 % 07/21/21 1415  Vitals shown include unvalidated device data.  Last Pain:  Vitals:   07/21/21 1305  TempSrc: Temporal  PainSc: 0-No pain         Complications: No notable events documented.

## 2021-07-21 NOTE — Progress Notes (Signed)
TRH night cross cover note:  I was notified by RN of morning hemoglobin 6.8, relative to 8.1 yesterday morning.  Per chart review, including my review of most recent TRH progress note, it appears that the patient has a history of chronic blood loss anemia in the setting of chronic GI bleed as a consequence of bleeding AVMs.  In this setting, she reportedly received transfusion of 1 unit PRBC on 07/15/2021, but has not yet required ensuing transfusion.  Vital signs appear stable, normotensive blood pressure no tachycardia.  No new symptoms reported at this time.  However, given interval decline in hemoglobin in the setting of chronic ongoing gastrointestinal bleed, with recent necessity for PRBC transfusion, I have went ahead and ordered slow transfusion of 1 unit PRBC, to be administered over 3 hours.  Additionally, I have ordered a posttransfusion H&H to be checked.     Babs Bertin, DO Hospitalist

## 2021-07-21 NOTE — CV Procedure (Signed)
Procedure: TEE  Indication: Mitral stenosis, aortic stenosis  Sedation: Per anesthesia  Findings:  Please see echo section for full report.  In short, there is severe aortic stenosis and moderate mitral stenosis.    Alisha [name] 07/21/2021 2:01 PM

## 2021-07-21 NOTE — TOC CM/SW Note (Signed)
HF TOC CM spoke to [place], [place] and they can offer long-term bed at dc. Will need FL2, discharge summary, and COVID test. Pt may need palliative services with at dc. Dtr. Sharyn Lull agreeable to Palliative Services with [facility]. Left message with [facility] for review. CSW [place] updated. [place], Heart Failure TOC CM [phone]

## 2021-07-21 NOTE — Consult Note (Signed)
Palliative Care Consult Note                                  Date: 07/21/2021   Patient Name: Alisha [name]  DOB: 1940-12-04  MRN: [id number]  Age / Sex: 80 y.o., female  PCP: Elise Benne Referring Physician: Darliss Cheney, MD  Reason for Consultation: Establishing goals of care  HPI/Patient Profile: 80 y.o. female  with past medical history of chronic kidney disease stage IV, severe pulmonary hypertension RV failure, chronic diastolic heart failure, anemia due to chronic GI blood loss who presented the emergency department with worsening renal function.  Also noted worsening shortness of breath, recent increase in diuretic.  After ER work-up she was admitted on 07/16/2021 with acute renal failure superimposed on CKD, pulmonary hypertension with RV failure/chronic heart failure, normocytic anemia with chronic GI blood loss, and others.   Overall felt to be near end-stage CHF.  PMT was consulted for goals of care discussion.  Past Medical History:  Diagnosis Date  . Aortic atherosclerosis ([place]) 08/30/2017  . Aortic stenosis 08/30/2017  . CAD (coronary artery disease), native coronary artery 08/30/2017  . CHF (congestive heart failure) ([place])   . Chronic diastolic heart failure ([place]) 08/30/2017  . Diabetes mellitus with peripheral vascular disease ([place]) 07/14/2014  . Hypertensive heart disease without CHF 08/30/2017  . Hypothyroidism (acquired) 08/30/2017  . Pulmonary hypertension ([place]) 07/04/2017    Subjective:   This NP Walden Field reviewed medical records, received report from team, assessed the patient and then meet at the patient's bedside to discuss diagnosis, prognosis, GOC, EOL wishes disposition and options.  I met with the patient earlier this afternoon, briefly with her daughter Carson Myrtle afternoon.  Later in the afternoon after her TEE I met with the the patient, patient's son, daughter-in-law, and daughter.    Concept of Palliative Care was introduced as specialized medical care for people and their families living with serious illness.  If focuses on providing relief from the symptoms and stress of a serious illness.  The goal is to improve quality of life for both the patient and the family. Values and goals of care important to patient and family were attempted to be elicited.  Created space and opportunity for patient  and family to explore thoughts and feelings regarding current medical situation   Natural trajectory and current clinical status were discussed. Questions and concerns addressed. Patient  encouraged to call with questions or concerns.    Patient/Family Understanding of Illness: The patient understands that she has heart failure.  She states that 4 years ago she did not think that she would be alive today but she was referred to the advanced heart failure clinic and credits them with her significant improvement over the past several years.    She knows that there is potentially nothing that can be done.  She has 2 bad valves, one of which is not fixable, and one which may be fixable but not sure.   This afternoon the patient and her family expressed that they noted she has significant heart disease and lung disease.  I also know she has kidney disease because of her heart disease.  The patient's daughter shared that after the procedure Dr. Aundra Dubin noted that her heart valve disease has progressed.  He does not think that she would be a candidate for TAVR, but he will formally discuss with Dr. Burt Knack to  get an official opinion.  He also shared with the patient's family that he does not feel that she can continue to live at an independent living level of care.  Life Review: The patient currently lives at a retirement facility at the [facility] in [place].  She notes that she is quite independent, has typically been able to provide all of her daily cares and even drive.   However, 2 weeks ago her heart took a turn for the worse.  She has 2 children and 4 grandchildren.  Her youngest grandchild is 37 years old, followed by a set of twins that are 79 years old, and another grandchild that is 53.  Patient Values: Independence, meaningful life, faith, family  Today's Discussion: Today we had an extensive discussion on her current clinical status.  I helped reinforce and explained what she understands.  She knows that there is likely nothing that can be done to improve her current situation.  She is fiercely independent and does not want to be a burden on her children or financial burden on anybody. She notes that at her retirement facility she sees patients that require considerable assistance and she knows that she would never want that.  She is adamant about not being resuscitated.  I explained what this means, that if her heart or lung stopped they would not do CPR/compressions, intubation, shocks.  She accepts that this is unlikely to provide her a meaningful recovery and she verbalizes again that she would not want to be resuscitated.  Her overarching theme to the discussion early in the afternoon is at she has had a great life, she is thankful for the life she has, and she would not want to be "just existing".  If she cannot have a continued enjoyable, meaningful life then she is at peace with.  She has a strong faith tradition which helps reinforce this and provides her comfort.  When we met later in the afternoon, the patient and her family shared that the TEE results are not promising and there is likely little options left for her.  However, they did note that there is a formal consult with Dr. Burt Knack on feasibility of TAVR, although Dr. Algernon Huxley does not think he will agree to this.  We had an extensive discussion on options ranging from aggressive care to comfort focus.  All of this hinges on whether or not there is treatment options for her disease.  We agree to  allow time for further evaluation, prior to making decisions.  The patient's son provided official [facility] POA paperwork to be scanned into the chart.  I provided emotional general support through therapeutic listening, sharing stories, empathy, and other techniques.  I answered all their questions and addressed all concerns.  Review of Systems  Constitutional:  Positive for activity change and fatigue.  Respiratory:  Positive for shortness of breath (with exertion).   Cardiovascular:  Negative for chest pain.  Gastrointestinal:  Negative for abdominal pain, nausea and vomiting.   Objective:   Primary Diagnoses: Present on Admission: . Acute renal failure superimposed on stage 4 chronic kidney disease ([place]) . AVM (arteriovenous malformation) of small bowel, acquired . CAD (coronary artery disease), native coronary artery . Chronic diastolic heart failure ([place]) . Prolonged QT interval   Physical Exam Vitals and nursing note reviewed.  Constitutional:      General: She is not in acute distress.    Appearance: She is ill-appearing. She is not toxic-appearing.  HENT:  Head: Normocephalic and atraumatic.  Cardiovascular:     Rate and Rhythm: Normal rate.  Pulmonary:     Effort: Pulmonary effort is normal. No respiratory distress.     Breath sounds: No wheezing or rhonchi.  Abdominal:     General: Abdomen is flat.     Palpations: Abdomen is soft.     Tenderness: There is no abdominal tenderness.  Skin:    General: Skin is warm and dry.  Neurological:     General: No focal deficit present.     Mental Status: She is alert.  Psychiatric:        Mood and Affect: Mood normal.        Behavior: Behavior normal.    Vital Signs:  BP (!) 128/53 (BP Location: Right Arm)   Pulse 72   Temp 97.9 F (36.6 C) (Oral)   Resp 19   Ht _0  (1.651 m)   Wt 72.1 kg   SpO2 94%   BMI 26.45 kg/m   Palliative Assessment/Data: 50%    Advanced Care Planning:   Primary Decision  Maker: PATIENT  Code Status/Advance Care Planning: DNR  A discussion was had today regarding advanced directives. Concepts specific to code status, artifical feeding and hydration, continued IV antibiotics and rehospitalization was had.  The difference between a aggressive medical intervention path and a palliative comfort care path for this patient at this time was had.  Decisions/Changes to ACP: She requested changing CODE STATUS to DNR Await options from cardiology team prior to further decisions  Assessment & Plan:   Impression: 80 year old female with likely end-stage CHF and RV failure, pulmonary hypertension, AKI on CKD, anemia, mitral valve stenosis (not a surgical candidate) and aortic stenosis (possibility of TAVR, though high risk).  Currently she has progressive cardiorenal syndrome.  She seems to be very at peace with her current situation.  She states 4 years ago she did not expected to be alive today and she considers this a miracle in its own right.  She has a strong faith tradition which provides her comfort.  Her overarching feeling is that she wants a meaningful life and if she cannot have that, then she does not want to be a burden on others and is at peace with dying. Awaiting formal opinions from the cardiology and advanced heart failure team is on potential options prior to making further decisions.  SUMMARY OF RECOMMENDATIONS   Change to DNR Allow time for recommendations from cardiology Continue ongoing discussions on goals of care, pending possible treatment options PMT will continue to follow along  Symptom Management:  Per primary team PMT is available to assist as needed  Prognosis:  Unable to determine  Discharge Planning:  To Be Determined   Discussed with: Patient, patient's family, nursing team, medical team.    Thank you for allowing [place] to participate in the care of MARYKATHERINE SHERWOOD PMT will continue to support holistically.  Time Total: 105  (total between am and pm meetings)  Greater than 50%  of this time was spent counseling and coordinating care related to the above assessment and plan.  Signed by: Walden Field, NP Palliative Medicine Team  Team Phone # [phone] (Nights/Weekends)  07/21/2021, 7:18 AM

## 2021-07-21 NOTE — TOC Initial Note (Addendum)
Transition of Care [facility]) - Initial/Assessment Note    Patient Details  Name: Alisha [name] MRN: [id number] Date of Birth: [birth year]  Transition of Care [facility]) CM/SW Contact:    Erenest Rasher, RN Phone Number: [phone] 07/21/2021, 1:52 PM  Clinical Narrative:                  HF TOC CM spoke to pt's dtr, Sharyn Lull. States she is on wait list for [facility]. Dtr is agreeable to SNF rehab. Gave permission to create FL2 and fax referral to SNF. Spoke to [facility] and they do not accept [facility] Medicare for SNF rehab. Dtr states the have planned to apply for long-term Medicaid for ALF.   Contacted Dorene Sorrow, long-term [facility] admission coordinator, # [phone] for information about bed. Pt does not have Medicaid for long-term bed.  4:49 pm Dtr, Sharyn Lull met with Palliative and SNF rehab not an option. Will work on long-term bed at [facility] per family request or possible Home Hospice/Residential Hospice. Waiting for final recommendations.    Expected Discharge Plan: Skilled Nursing Facility Barriers to Discharge: Continued Medical Work up   Patient Goals and CMS Choice Patient states their goals for this hospitalization and ongoing recovery are:: will need more assistance at home CMS Medicare.gov Compare Post Acute Care list provided to:: Patient Represenative (must comment) (daughter-Michelle) Choice offered to / list presented to : Adult Children  Expected Discharge Plan and Services Expected Discharge Plan: Skilled Nursing Facility In-house Referral: Clinical Social Work Discharge Planning Services: CM Consult Post Acute Care Choice: [place]   Prior Living Arrangements/Services   Lives with:: Self Patient language and need for interpreter reviewed:: Yes Do you feel safe going back to the place where you live?: Yes      Need for Family Participation in Patient Care: Yes (Comment) Care giver support system in place?:  Yes (comment) Current home services: DME (oxygen, rolling with seat) Criminal Activity/Legal Involvement Pertinent to Current Situation/Hospitalization: No - Comment as needed  Activities of Daily Living Home Assistive Devices/Equipment: Eyeglasses, Environmental consultant (specify type), Hearing aid, Dentures (specify type) (partial upper) ADL Screening (condition at time of admission) Patient's cognitive ability adequate to safely complete daily activities?: Yes Is the patient deaf or have difficulty hearing?: Yes (has hearing aids) Does the patient have difficulty seeing, even when wearing glasses/contacts?: No Does the patient have difficulty concentrating, remembering, or making decisions?: No Patient able to express need for assistance with ADLs?: Yes Does the patient have difficulty dressing or bathing?: No Independently performs ADLs?: Yes (appropriate for developmental age) Does the patient have difficulty walking or climbing stairs?: Yes Weakness of Legs: Both Weakness of Arms/Hands: Both  Permission Sought/Granted Permission sought to share information with : Case Manager, Family Supports, PCP Permission granted to share information with : Yes, Verbal Permission Granted  Share Information with NAME: Gayland Curry  Permission granted to share info w AGENCY: SNF  Permission granted to share info w Relationship: daugther  Permission granted to share info w Contact Information: [phone]  Emotional Assessment       Orientation: : Oriented to Self, Oriented to Place, Oriented to  Time, Oriented to Situation   Psych Involvement: No (comment)  Admission diagnosis:  Acute renal failure superimposed on stage 4 chronic kidney disease ([place]) [N17.9, N18.4] Patient Active Problem List   Diagnosis Date Noted   Acute renal failure superimposed on stage 4 chronic kidney disease ([place]) 07/17/2021   Prolonged  QT interval 07/17/2021   Gout 05/14/2021   High risk medication use 05/14/2021   AVM  (arteriovenous malformation) of small bowel, 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   Iron deficiency anemia due to chronic blood loss 02/21/2018   Tubular adenoma of colon 01/03/2018   Thrombophlebitis of superficial veins of right lower extremity 12/18/2017   CAD (coronary artery disease), native coronary artery 08/30/2017   Dyspnea 08/30/2017   Aortic atherosclerosis ([place]) 08/30/2017   Hypertensive heart disease without CHF 08/30/2017   Aortic stenosis 08/30/2017   Hypothyroidism (acquired) 08/30/2017   Chronic diastolic heart failure ([place]) 08/30/2017   Fungal dermatitis 07/06/2017   Pulmonary hypertension, unspecified ([place]) 07/04/2017   Cataract, nuclear sclerotic, left eye 92/15/1582   Diastolic dysfunction 65/87/1841   Non-rheumatic mitral valve stenosis 05/17/2016   LAE (left atrial enlargement) 05/17/2016   Chronic venous insufficiency 04/11/2016   Diabetes mellitus with peripheral vascular disease ([place]) 07/14/2014   Type II or unspecified type diabetes mellitus with peripheral circulatory disorders, not stated as uncontrolled(250.70) 06/05/2014   PAD (peripheral artery disease) ([place]) 05/20/2014   GERD (gastroesophageal reflux disease) 11/12/2012   Chronic kidney disease, stage III (moderate) ([place]) 04/19/2010   PCP:  Mackie Pai, PA-C Pharmacy:   [facility] ([place], [facility] [address] [place] [address] [place] [address] Phone: [phone] Fax: [phone]  [facility] [phone] [facility] [place], [place] [address] [place] [address] Phone: [phone] Fax: [place] [address] [place] [address] Phone: [phone] Fax: [phone]  [place], [place] [address] [place] [address] Phone: [phone] Fax: [phone]     Social Determinants of Health (SDOH) Interventions     Readmission Risk Interventions No flowsheet data found.

## 2021-07-22 ENCOUNTER — Encounter (HOSPITAL_COMMUNITY): Payer: Self-pay | Admitting: Cardiology

## 2021-07-22 DIAGNOSIS — N179 Acute kidney failure, unspecified: Secondary | ICD-10-CM | POA: Diagnosis not present

## 2021-07-22 DIAGNOSIS — Z7189 Other specified counseling: Secondary | ICD-10-CM | POA: Diagnosis not present

## 2021-07-22 DIAGNOSIS — N184 Chronic kidney disease, stage 4 (severe): Secondary | ICD-10-CM | POA: Diagnosis not present

## 2021-07-22 DIAGNOSIS — R0602 Shortness of breath: Secondary | ICD-10-CM | POA: Diagnosis not present

## 2021-07-22 DIAGNOSIS — I350 Nonrheumatic aortic (valve) stenosis: Secondary | ICD-10-CM | POA: Diagnosis not present

## 2021-07-22 DIAGNOSIS — I272 Pulmonary hypertension, unspecified: Secondary | ICD-10-CM | POA: Diagnosis not present

## 2021-07-22 DIAGNOSIS — Z515 Encounter for palliative care: Secondary | ICD-10-CM | POA: Diagnosis not present

## 2021-07-22 LAB — CBC
HCT: 27.5 % — ABNORMAL LOW (ref 36.0–46.0)
Hemoglobin: 8.5 g/dL — ABNORMAL LOW (ref 12.0–15.0)
MCH: 26.7 pg (ref 26.0–34.0)
MCHC: 30.9 g/dL (ref 30.0–36.0)
MCV: 86.5 fL (ref 80.0–100.0)
Platelets: 290 10*3/uL (ref 150–400)
RBC: 3.18 MIL/uL — ABNORMAL LOW (ref 3.87–5.11)
RDW: 18.9 % — ABNORMAL HIGH (ref 11.5–15.5)
WBC: 9.3 10*3/uL (ref 4.0–10.5)
nRBC: 0 % (ref 0.0–0.2)

## 2021-07-22 LAB — BPAM RBC
Blood Product Expiration Date: 202211122359
ISSUE DATE / TIME: 202211091031
Unit Type and Rh: 600

## 2021-07-22 LAB — BASIC METABOLIC PANEL
Anion gap: 9 (ref 5–15)
BUN: 87 mg/dL — ABNORMAL HIGH (ref 8–23)
CO2: 21 mmol/L — ABNORMAL LOW (ref 22–32)
Calcium: 8.4 mg/dL — ABNORMAL LOW (ref 8.9–10.3)
Chloride: 108 mmol/L (ref 98–111)
Creatinine, Ser: 3.19 mg/dL — ABNORMAL HIGH (ref 0.44–1.00)
GFR, Estimated: 14 mL/min — ABNORMAL LOW (ref >60)
Glucose, Bld: 133 mg/dL — ABNORMAL HIGH (ref 70–99)
Potassium: 3.4 mmol/L — ABNORMAL LOW (ref 3.5–5.1)
Sodium: 138 mmol/L (ref 135–145)

## 2021-07-22 LAB — TYPE AND SCREEN
ABO/RH(D): A POS
Antibody Screen: NEGATIVE
Unit division: 0

## 2021-07-22 MED ORDER — POTASSIUM CHLORIDE CRYS ER 20 MEQ PO TBCR
40.0000 meq | EXTENDED_RELEASE_TABLET | Freq: Once | ORAL | Status: AC
  Administered 2021-07-22: 40 meq via ORAL
  Filled 2021-07-22: qty 2

## 2021-07-22 MED ORDER — METOLAZONE 2.5 MG PO TABS
2.5000 mg | ORAL_TABLET | Freq: Once | ORAL | Status: AC
  Administered 2021-07-22: 2.5 mg via ORAL
  Filled 2021-07-22: qty 1

## 2021-07-22 MED ORDER — OCTREOTIDE ACETATE 50 MCG/ML IJ SOLN
50.0000 ug | Freq: Two times a day (BID) | INTRAMUSCULAR | Status: AC
  Administered 2021-07-22 – 2021-07-23 (×3): 50 ug via SUBCUTANEOUS
  Filled 2021-07-22 (×3): qty 1

## 2021-07-22 NOTE — TOC Progression Note (Addendum)
Transition of Care [facility]) - Progression Note    Patient Details  Name: Alisha [name] MRN: [id number] Date of Birth: [birth year]  Transition of Care [facility]) CM/SW [place], [place] Phone Number: 07/22/2021, 3:08 PM  Clinical Narrative:    HF CSW received a call from Alisha [name] from [place] regarding the patients medications such as the Adempas/uptravi due to the expense of the medication Alisha [name] cannot accept [name] due to the cost of the medication. CSW received a call from the patient's daughter, Alisha [name] [phone] asking if there is another medication that her mother can be on that is less expensive so that she can go to Alisha [name] at time of discharge. CSW reached out to cardiology and pharmacy for further discussion. HF Pharmacist reported that the patient has a grant through 09/11/21 so the med's are $0, and the pharmacist just got off the phone with [facility] who says that they will deliver the medication to the patient's home or family's home as long as the facility will accept the outside medication. CSW provided the pharmacist with Alisha [name]'s information to call and speak with Alisha [name] to discuss, [phone].  CSW will continue to follow throughout discharge.   Expected Discharge Plan: [place] Barriers to Discharge: Continued Medical Work up  Expected Discharge Plan and Services Expected Discharge Plan: [place] In-house Referral: Clinical Social Work Discharge Planning Services: CM Consult Post Acute Care Choice: [place]                                         Social Determinants of Health (SDOH) Interventions    Readmission Risk Interventions No flowsheet data found.  Alisha [name], MSW, [place] Heart Failure Social Worker

## 2021-07-22 NOTE — Progress Notes (Addendum)
Advanced Heart Failure Rounding Note  PCP-Cardiologist: Dr. Aundra Dubin   Subjective:    Is/Os not accurate. Patient reports multiple unmeasured voids. Weight down 1 lb overnight. On IV lasix 80 mg BID + 2.5 mg metolazone  Scr trending back down, peak 4.37 > 3.8 > 3.5 > 3.2. Prior baseline around 1.6. K 3.4.  1 U PRBCs 11/09 for Hgb 6.8, up to 8.5 this am. Still having melena.   SBE in 10/21 with no source of bleeding.   SBP okay, diastolic BP soft  Structural team evaluating for potential TAVR  TEE 11/09: LVEF 60-65%, RV mildly reduced, peak RV-RA gradient 40 mmHg, severe aortic valve stenosis with mean gradient 46 mmhg and AVA 0.87 cm2, moderate mitral stenosis with mean gradient 6 mmHg  RHC Procedural Findings: Hemodynamics (mmHg) RA mean 10 RV 86/14 PA 90/29, mean 53 PCWP mean 20  Oxygen saturations: PA 61% AO 95%  Cardiac Output (Fick) 6.93  Cardiac Index (Fick) 3.89 PVR 4.8 WU   Objective:   Weight Range: 68.1 kg Body mass index is 24.98 kg/m.   Vital Signs:   Temp:  [97.4 F (36.3 C)-98.6 F (37 C)] 97.9 F (36.6 C) (11/10 0639) Pulse Rate:  [72-90] 78 (11/10 0639) Resp:  [16-24] 21 (11/10 0639) BP: (95-137)/(39-68) 116/58 (11/10 0639) SpO2:  [92 %-100 %] 95 % (11/10 0639) Weight:  [68.1 kg-68.6 kg] 68.1 kg (11/10 0639) Last BM Date: 07/21/21  Weight change: Filed Weights   07/21/21 0811 07/21/21 1302 07/22/21 0639  Weight: 68.6 kg 68.6 kg 68.1 kg    Intake/Output:   Intake/Output Summary (Last 24 hours) at 07/22/2021 0701 Last data filed at 07/22/2021 0641 Gross per 24 hour  Intake 1336 ml  Output 1450 ml  Net -114 ml      Physical Exam    General:  Thin, elderly female.  Comfortable on 4L O2 [place] HEENT: normal Neck: supple. JVP 10 cm. Carotids 2+ bilat; no bruits. No lymphadenopathy or thryomegaly appreciated. Cor: PMI nondisplaced. Regular rate & rhythm. 3/6 SEM RUSB Lungs: clear Abdomen: soft, nontender, nondistended.   Extremities: no cyanosis, clubbing, rash, edema Neuro: alert & orientedx3, cranial nerves grossly intact. moves all 4 extremities w/o difficulty. Affect pleasant    Telemetry   NSR 60s-70s (personally reviewed)  Labs    CBC Recent Labs    07/21/21 0259 07/21/21 1410 07/21/21 1546 07/22/21 0309  WBC 7.9  --   --  9.3  HGB 6.8*   < > 9.4* 8.5*  HCT 22.3*   < > 29.4* 27.5*  MCV 88.5  --   --  86.5  PLT 298  --   --  290   < > = values in this interval not displayed.   Basic Metabolic Panel Recent Labs    07/20/21 0428 07/21/21 0259 07/21/21 1410 07/22/21 0309  NA 138 138 143 138  K 3.7 3.4* 3.3* 3.4*  CL 108 108 108 108  CO2 17* 19*  --  21*  GLUCOSE 137* 113* 147* 133*  BUN 83* 83* 87* 87*  CREATININE 3.45* 3.40* 3.50* 3.19*  CALCIUM 8.5* 8.2*  --  8.4*  MG 2.1  --   --   --    Liver Function Tests No results for input(s): AST, ALT, ALKPHOS, BILITOT, PROT, ALBUMIN in the last 72 hours. No results for input(s): LIPASE, AMYLASE in the last 72 hours. Cardiac Enzymes No results for input(s): CKTOTAL, CKMB, CKMBINDEX, TROPONINI in the last 72 hours.  BNP:  BNP (last 3 results) Recent Labs    06/18/21 1225 07/17/21 0336 07/19/21 0103  BNP 986.2* 1,247.8* 1,758.6*    ProBNP (last 3 results) Recent Labs    01/26/21 1313  PROBNP 823.0*     D-Dimer No results for input(s): DDIMER in the last 72 hours. Hemoglobin A1C No results for input(s): HGBA1C in the last 72 hours. Fasting Lipid Panel No results for input(s): CHOL, HDL, LDLCALC, TRIG, CHOLHDL, LDLDIRECT in the last 72 hours. Thyroid Function Tests No results for input(s): TSH, T4TOTAL, T3FREE, THYROIDAB in the last 72 hours.  Invalid input(s): FREET3  Other results:   Imaging    ECHO TEE  Result Date: 07/21/2021    TRANSESOPHOGEAL ECHO REPORT   Patient Name:   Alisha [name] [facility] Date of Exam: 07/21/2021 Medical Rec #:  [id number]      Height:       65.0 in Accession #:    [id number]     Weight:        151.2 lb Date of Birth:  September 22, 1940     BSA:          1.757 m Patient Age:    80 years       BP:           105/60 mmHg Patient Gender: F              HR:           70 bpm. Exam Location:  Inpatient Procedure: Transesophageal Echo, Color Doppler, 3D Echo and Cardiac Doppler Indications:    AS, MS  History:        Patient has prior history of Echocardiogram examinations.  Sonographer:    Dustin Flock RDCS Referring Phys: [phone] Wilmer Floor SIMMONS PROCEDURE: The transesophogeal probe was passed without difficulty through the esophogus of the patient. Sedation performed by different physician. The patient developed no complications during the procedure. IMPRESSIONS  1. Left ventricular ejection fraction, by estimation, is 60 to 65%. The left ventricle has normal function. The left ventricle has no regional wall motion abnormalities. There is mild left ventricular hypertrophy.  2. Peak RV-RA gradient 40 mmHg. Right ventricular systolic function is mildly reduced. The right ventricular size is moderately enlarged.  3. Left atrial size was moderately dilated. No left atrial/left atrial appendage thrombus was detected.  4. Right atrial size was moderately dilated.  5. 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 Mild to moderate mitral valve regurgitation. Moderate mitral stenosis. The mean mitral valve gradient is 6.0 mmHg with MVA 1.3 cm^2 by VTI. Moderate to severe mitral annular calcification.  6. The aortic valve is tricuspid. Aortic valve regurgitation is mild. Severe aortic valve stenosis. Aortic valve area, by VTI measures 0.87 cm. Aortic valve mean gradient measures 46.0 mmHg.  7. A small pericardial effusion is present. FINDINGS  Left Ventricle: Left ventricular ejection fraction, by estimation, is 60 to 65%. The left ventricle has normal function. The left ventricle has no regional wall motion abnormalities. The left ventricular internal cavity size was normal in size. There is  mild left  ventricular hypertrophy. Right Ventricle: Peak RV-RA gradient 40 mmHg. The right ventricular size is moderately enlarged. No increase in right ventricular wall thickness. Right ventricular systolic function is mildly reduced. Left Atrium: Left atrial size was moderately dilated. No left atrial/left atrial appendage thrombus was detected. Right Atrium: Right atrial size was moderately dilated. Pericardium: A small pericardial effusion is present. Mitral Valve: The mitral valve is rheumatic. There is  moderate calcification of the mitral valve leaflet(s). Moderate to severe mitral annular calcification. Mild to moderate mitral valve regurgitation. Moderate mitral valve stenosis. MV peak gradient, 16.2 mmHg. The mean mitral valve gradient is 6.0 mmHg. Tricuspid Valve: The tricuspid valve is normal in structure. Tricuspid valve regurgitation is mild. Aortic Valve: The aortic valve is tricuspid. Aortic valve regurgitation is mild. Severe aortic stenosis is present. Aortic valve mean gradient measures 46.0 mmHg. Aortic valve peak gradient measures 74.1 mmHg. Aortic valve area, by VTI measures 0.87 cm. Pulmonic Valve: The pulmonic valve was normal in structure. Pulmonic valve regurgitation is not visualized. Aorta: The aortic root is normal in size and structure. IAS/Shunts: No atrial level shunt detected by color flow Doppler.  LEFT VENTRICLE PLAX 2D LVOT diam:     2.00 cm LV SV:         83 LV SV Index:   47 LVOT Area:     3.14 cm  AORTIC VALVE AV Area (Vmax):    0.89 cm AV Area (Vmean):   0.80 cm AV Area (VTI):     0.87 cm AV Vmax:           430.50 cm/s AV Vmean:          303.500 cm/s AV VTI:            0.953 m AV Peak Grad:      74.1 mmHg AV Mean Grad:      46.0 mmHg LVOT Vmax:         122.00 cm/s LVOT Vmean:        76.900 cm/s LVOT VTI:          0.264 m LVOT/AV VTI ratio: 0.28 MITRAL VALVE               TRICUSPID VALVE MV Area (PHT): 1.83 cm    TR Peak grad:   39.7 mmHg MV Area VTI:   1.30 cm    TR Vmax:         315.00 cm/s MV Peak grad:  16.2 mmHg MV Mean grad:  6.0 mmHg    SHUNTS MV Vmax:       2.01 m/s    Systemic VTI:  0.26 m MV Vmean:      113.3 cm/s  Systemic Diam: 2.00 cm Daelynn Blower McleanMD Electronically signed by Franki Monte Signature Date/Time: 07/21/2021/2:35:24 PM    Final      Medications:     Scheduled Medications:  atorvastatin  20 mg Oral Daily   furosemide  80 mg Intravenous BID   levothyroxine  125 mcg Oral QAC breakfast   metoprolol succinate  25 mg Oral BID   pantoprazole  40 mg Oral BID   Riociguat  2.5 mg Oral TID   Selexipag  1,600 mcg Oral BID   Selexipag  800 mcg Oral BID   sodium bicarbonate  650 mg Oral TID   sodium chloride flush  3 mL Intravenous Q12H   sodium chloride flush  3 mL Intravenous Q12H   sodium chloride flush  3 mL Intravenous Q12H    Infusions:  sodium chloride      PRN Medications: sodium chloride, acetaminophen, ondansetron (ZOFRAN) IV, sodium chloride flush   Assessment/Plan   Assessment/Plan: 1. Pulmonary hypertension with RV failure:   - Severe PAH with PVR 7 WU on 1/19 RHC.  Etiology uncertain.  She has a history of DVT remotely but V/Q scan in 2/19 did not show evidence for chronic PE. She has OSA and uses  CPAP.  Serologic workup showed only positive ANA but when additional serologies were done, they were all negative.  CT chest with no ILD or emphysema.  Valvular heart disease does not appear significant enough to cause this degree of PH.  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  - She did not tolerate Opsumit or ambrisentan due to significant lower extremity edema and worsening dyspnea.   - RHC in 4/20 showed severe pulmonary hypertension with normal filling pressures and preserved cardiac output.  Echo in 4/21 showed normal RV with PASP 50 mmHg, echo 4/22 showed normal RV with PASP estimated 41 mmHg. TEE in 4/22 showed D-shaped septum with mild RV enlargement/mildly decreased RV systolic function.  At baseline, has oxygen for exertion.   - Echo this  admission with D-shaped septum, moderate RVE and moderate RV dysfunction, also concerning for worsening valvular HD. - RHC this admit showed mild-moderately elevated right and left filling pressures, preserved cardiac output and severe primarily pulmonary arterial hypertension. PVR 4.8 WU. 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  - Continue Adempas 2.5 mg tid.  - Continue Uptravi 2400 mcg BID.   2. Acute on chronic diastolic CHF:  - In setting of pulmonary hypertension and valvular disease.  Worsened symptoms with volume overload on exam.  Unfortunately, also appears to have developed progressive cardiorenal syndrome.  - RHC showed mild-moderately elevated right and left filling pressures, preserved cardiac output, mRA 10, mPCWP 20, CI 3.89. TEE 11/09 with evidence of severe aortic valve stenosis and moderate mitral stenosis (mitral valve appears rheumatic) - Reports good diuresis. ? Is/Os. Volume improving but still appears overloaded.  IV lasix 80 mg BID + 2.5 mg again today.  3. Syncope: Suspect related to severe pulmonary hypertension.  None recently.   4. 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  With heavy calcification, I do not think that she would be a mitral valvuloplasty candidate.  AS also has been moderate in the past.   - Echo this admission reviewed, I am worried for progressive valvular heart disease leading to worsening volume status as well as worsening cardiorenal syndrome.  Echo this admission with moderate-severe aortic stenosis (mean gradient 34 mmHg with AVA around 1.0 cm^2) and moderate-severe mitral stenosis (mean gradient 10 mmHg).  - TEE 11/09: LVEF 60-65%, severe aortic valve stenosis with mean gradient of 46 mmHg and AVA 0.87 cm2, moderate mitral stenosis with mean gradient 6 mmHg - Could potentially undergo TAVR for aortic valve stenosis but MV looks too calcified for valvuloplasty.  She  would be a very high risk surgical patient.  Structural team evaluating to review options. 5. H/o DVT: No chronic PE on prior V/Q scan.   6. OSA: Continue CPAP qHS.  7. AKI on CKD Stage 3: Prior baseline creatinine has been around 1.6, recently with significant increase.  Admitted with creatinine at 4.4, now down to 3.2.  Concern for progressive cardiorenal syndrome possibly with worsening valvular HD.  8. H/o GI bleeding: Recurrent.  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 In setting of severe AS. - Hgb down to 6.8 on 11/09, received 1 U pRBCs. Hgb 8.5 this am. - Would involve GI, had deep endoscopy in 10/22, no source of bleeding identified. Still reporting melena. - Iron deficient. Had been receiving IV iron through [facility]  - Continue PPI 9. Hypokalemia: - K 3.4.  - Supp.  Palliative Care consulted.  Code status updated - Now DNR  PT/OT,  CR   Length of Stay: 5  FINCH, LINDSAY N, PA-C  07/22/2021, 7:01 AM  Advanced Heart Failure Team Pager [phone] (M-F; 7a - 5p)  Please contact [facility] for night-coverage after hours (5p -7a ) and weekends on [email or web address]  Patient seen with PA, agree with the above note.   TEE yesterday showed severe aortic stenosis, moderate mitral stenosis .   She is breathing better today when walking around the room.  Had 1 unit PRBCs with hgb up to 8.5.  I/Os do not appear accurate again, but weight lower and she says she has urinated a lot.  Creatinine mildly lower at 3.19.   General: NAD Neck: JVP 12-14 cm, no thyromegaly or thyroid nodule.  Lungs: Clear to auscultation bilaterally with normal respiratory effort. CV: Nondisplaced PMI.  Heart regular S1/S2, no S3/S4, 3/6 SEM RUSB with obscured S2.  No peripheral edema.   Abdomen: Soft, nontender, no hepatosplenomegaly, no distention.  Skin: Intact without lesions or rashes.  Neurologic: Alert and oriented x 3.  Psych: Normal affect. Extremities: No clubbing or cyanosis.  HEENT: Normal.    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  She is not a candidate for surgical aortic/mitral valve replacement.  With heavily calcified mitral valve (possibly rheumatic), she is unlikely to be a valvuloplasty candidate.  Consideration of TAVR is complicated by the severe PH, which is concerning for anesthesia but probably not prohibitive; chronic anemia which is going to be an issue with post-procedure antiplatelet therapy; and progressive renal dysfunction (likely cardiorenal) which is going to limit our ability to give her contrast.  She would be a poor HD candidate with severe pulmonary hypertension.  On the flip side, she has been very functional and living alone up to this admission.  I am going to have Dr. Burt Knack see her for TAVR consideration though as above, she may not be a candidate.  If we decide on no TAVR, will need palliative care involvement (family understands this).   Continue Lasix 80 mg IV bid with metolazone 2.5 x 1 today, would like to see a little more fluid off.   With ongoing melena, I am going to involve GI.  She had small bowel enteroscopy last month that was unrevealing.   Loralie Champagne 07/22/2021 11:23 AM

## 2021-07-22 NOTE — Progress Notes (Signed)
Daily Progress Note   Patient Name: Alisha [name]       Date: 07/22/2021 DOB: [birth year]  Age: 80 y.o. MRN#: [id number] Attending Physician: Darliss Cheney, MD Primary Care Physician: Elise Benne Admit Date: 07/16/2021 Length of Stay: 5 days  Reason for Consultation/Follow-up: Establishing goals of care  HPI/Patient Profile:  80 y.o. female  with past medical history of chronic kidney disease stage IV, severe pulmonary hypertension RV failure, chronic diastolic heart failure, anemia due to chronic GI blood loss who presented the emergency department with worsening renal function.  Also noted worsening shortness of breath, recent increase in diuretic.  After ER work-up she was admitted on 07/16/2021 with acute renal failure superimposed on CKD, pulmonary hypertension with RV failure/chronic heart failure, normocytic anemia with chronic GI blood loss, and others.    Overall felt to be near end-stage CHF.  PMT was consulted for goals of care discussion.  Subjective:   Subjective: Chart Reviewed. Updates received. Patient Assessed. Created space and opportunity for patient  and family to explore thoughts and feelings regarding current medical situation.  Today's Discussion: Today we had an extensive discussion with the patient and her son who was at the bedside. She states she feels somewhat better today. She saw someone from the structural heart team today and Dr. Aundra Dubin as well. Still waiting for final decision on whether TAVR is available. We again discussed possible avenues for moving forward, ranging from TAVR and rehab for quality to hospice if no good treatment options yeat. They asked several questions, which were answered. Agree to allow more time for options to be decided at which point we would continue making decisions. Patient remains steadfast about wanting a good quality of life.  Review of Systems  Constitutional:  Positive for activity change and fatigue.  Respiratory:   Positive for shortness of breath (improved).   Cardiovascular:  Negative for chest pain.  Gastrointestinal:  Negative for abdominal pain, nausea and vomiting.   Objective:   Vital Signs:  BP (!) 104/59 (BP Location: Right Arm)   Pulse 75   Temp 98.2 F (36.8 C) (Oral)   Resp 20   Ht 5\' 5"  (1.651 m)   Wt 68.1 kg   SpO2 91%   BMI 24.98 kg/m   Physical Exam: Physical Exam Vitals and nursing note reviewed.  Constitutional:      General: She is not in acute distress.    Appearance: She is ill-appearing.  HENT:     Head: Normocephalic and atraumatic.  Cardiovascular:     Rate and Rhythm: Normal rate.  Pulmonary:     Effort: Pulmonary effort is normal. No respiratory distress.     Breath sounds: No wheezing or rhonchi.  Abdominal:     General: Abdomen is flat.     Palpations: Abdomen is soft.  Skin:    General: Skin is warm and dry.  Neurological:     General: No focal deficit present.     Mental Status: She is alert and oriented to person, place, and time.  Psychiatric:        Mood and Affect: Mood normal.        Behavior: Behavior normal.    Palliative Assessment/Data: 40%   Assessment & Plan:   Impression: Present on Admission: . Acute renal failure superimposed on stage 4 chronic kidney disease ([place]) . AVM (arteriovenous malformation) of small bowel, acquired . CAD (coronary artery disease), native coronary artery . Chronic diastolic heart failure ([place]) . Prolonged QT  interval  80 year old female with likely end-stage CHF and RV failure, pulmonary hypertension, AKI on CKD, anemia, mitral valve stenosis (not a surgical candidate) and aortic stenosis (possibility of TAVR, though high risk).  Currently she has progressive cardiorenal syndrome.  She seems to be very at peace with her current situation.  She states 4 years ago she did not expected to be alive today and she considers this a miracle in its own right.  She has a strong faith tradition which provides her  comfort.  Her overarching feeling is that she wants a meaningful life and if she cannot have that, then she does not want to be a burden on others and is at peace with dying. Awaiting formal opinions from the cardiology and advanced heart failure team is on potential options prior to making further decisions.  SUMMARY OF RECOMMENDATIONS   Change to DNR Allow time for recommendations from cardiology Continue ongoing discussions on goals of care, pending possible treatment options PMT will continue to follow along  Code Status: DNR  Prognosis: Unable to determine  Discharge Planning: To Be Determined  Discussed with: Patient, patient's family, medical team, nursing team  Thank you for allowing [place] to participate in the care of TAUNI SANKS PMT will continue to support holistically.  Time Total: 75 min  Visit consisted of counseling and education dealing with the complex and emotionally intense issues of symptom management and palliative care in the setting of serious and potentially life-threatening illness. Greater than 50%  of this time was spent counseling and coordinating care related to the above assessment and plan.  Walden Field, NP Palliative Medicine Team  Team Phone # [phone] (Nights/Weekends)  05/11/2021, 8:17 AM

## 2021-07-22 NOTE — Evaluation (Signed)
Physical Therapy Evaluation Patient Details Name: Alisha [name] MRN: [id number] DOB: Jan 22, 1941 Today's Date: 07/22/2021  History of Present Illness  80 y.o. F admitted on 11/5 due to SoB, stage 4 kidney failure, and end stage CHF. PMH significnat for CKD, Chronic GI Bleed, HTN, CHF.   Clinical Impression  Pt presents with condition above and deficits mentioned below, see PT Problem List. PTA, she was independent without AD, even on uneven terrain, for all functional mobility living at an ILF. Plan is to d/c to a nursing home due to her medical prognosis. Currently, pt displays deficits in aerobic endurance/activity tolerance and balance, limiting her mobility and resulting in her needing 1 UE support intermittently and several standing rest breaks to ambulate household distances. Pt educated on pursed lip breathing and weaned pt from 4L to 2L this date. Pt would continue to benefit from acute PT services to maximize her return to baseline, but at this time will likely not need further follow-up PT. Will continue to follow acutely.     Recommendations for follow up therapy are one component of a multi-disciplinary discharge planning process, led by the attending physician.  Recommendations may be updated based on patient status, additional functional criteria and insurance authorization.  Follow Up Recommendations No PT follow up    Assistance Recommended at Discharge Intermittent Supervision/Assistance  Functional Status Assessment Patient has had a recent decline in their functional status and demonstrates the ability to make significant improvements in function in a reasonable and predictable amount of time. (may be limited by medical prognosis)  Equipment Recommendations  None recommended by PT    Recommendations for Other Services       Precautions / Restrictions Precautions Precautions: Fall Precaution Comments: Watch O2 Restrictions Weight Bearing Restrictions: No       Mobility  Bed Mobility Overal bed mobility: Modified Independent             General bed mobility comments: No difficulties noted, HOB elevated to 13 degrees to simulate home with HOB elevated there for her acid reflux, per pt    Transfers Overall transfer level: Independent Equipment used: None               General transfer comment: Able to come to stand safely without LOB or unsteadiness.    Ambulation/Gait Ambulation/Gait assistance: Min guard Gait Distance (Feet): 260 Feet (x2 bouts of ~20 ft > ~260 ft) Assistive device: None;1 person hand held assist Gait Pattern/deviations: Step-through pattern;Decreased stride length;Narrow base of support;Staggering right;Staggering left Gait velocity: reduced Gait velocity interpretation: <1.8 ft/sec, indicate of risk for recurrent falls   General Gait Details: Pt with slow gait and intermittent narrow placement of feet and taggering laterally, resulting in pt intermittently regaining balance by reaching for rail on wall for support, min guard for assist. x3 standing rest breaks.  Stairs            Wheelchair Mobility    Modified Rankin (Stroke Patients Only)       Balance Overall balance assessment: Mild deficits observed, not formally tested                                           Pertinent Vitals/Pain Pain Assessment: Faces Faces Pain Scale: No hurt Pain Intervention(s): Monitored during session    Home Living Family/patient expects to be discharged to:: Assisted living Living Arrangements: Children (daughter) Available  Help at Discharge: Family;Available 24 hours/day Type of Home: House Home Access: Stairs to enter Entrance Stairs-Rails: None Entrance Stairs-Number of Steps: 2-3 mini steps   Home Layout: One level Home Equipment: Cane - single point;Rollator (4 wheels);Rolling Walker (2 wheels) Additional Comments: Pt had planned to d/c home with daughter during OT eval, now  plan is to d/c to nursing home.    Prior Function Prior Level of Function : Independent/Modified Independent             Mobility Comments: Pt reported last week she was ambulating with no DME long distances and on uneven terrain. Denies any recent falls. ADLs Comments: Pt reported last week she was independent with all ADLs and IADLs     Hand Dominance   Dominant Hand: Right    Extremity/Trunk Assessment   Upper Extremity Assessment Upper Extremity Assessment: Defer to OT evaluation    Lower Extremity Assessment Lower Extremity Assessment: Overall WFL for tasks assessed    Cervical / Trunk Assessment Cervical / Trunk Assessment: Normal  Communication   Communication: No difficulties  Cognition Arousal/Alertness: Awake/alert Behavior During Therapy: WFL for tasks assessed/performed Overall Cognitive Status: Within Functional Limits for tasks assessed                                          General Comments General comments (skin integrity, edema, etc.): 89% on RA at rest, 83% on RA ambulating, 90% on 2L ambulating; encouraged OOB mobility; educated pt on using rollator to conserve energy    Exercises     Assessment/Plan    PT Assessment Patient needs continued PT services  PT Problem List Decreased activity tolerance;Decreased balance;Decreased mobility;Cardiopulmonary status limiting activity       PT Treatment Interventions DME instruction;Gait training;Functional mobility training;Therapeutic activities;Therapeutic exercise;Balance training;Neuromuscular re-education;Patient/family education    PT Goals (Current goals can be found in the Care Plan section)  Acute Rehab PT Goals Patient Stated Goal: to improve PT Goal Formulation: With patient/family Time For Goal Achievement: 07/29/21 Potential to Achieve Goals: Fair    Frequency Min 3X/week   Barriers to discharge        Co-evaluation               AM-PAC PT "6 Clicks"  Mobility  Outcome Measure Help needed turning from your back to your side while in a flat bed without using bedrails?: None Help needed moving from lying on your back to sitting on the side of a flat bed without using bedrails?: None Help needed moving to and from a bed to a chair (including a wheelchair)?: None Help needed standing up from a chair using your arms (e.g., wheelchair or bedside chair)?: None Help needed to walk in hospital room?: A Little Help needed climbing 3-5 steps with a railing? : A Little 6 Click Score: 22    End of Session Equipment Utilized During Treatment: Oxygen Activity Tolerance: Patient tolerated treatment well Patient left: in bed;with call bell/phone within reach;with family/visitor present Nurse Communication: Mobility status PT Visit Diagnosis: Unsteadiness on feet (R26.81);Other abnormalities of gait and mobility (R26.89);Difficulty in walking, not elsewhere classified (R26.2)    Time: 1420-1510 PT Time Calculation (min) (ACUTE ONLY): 50 min   Charges:   PT Evaluation $PT Eval Low Complexity: 1 Low PT Treatments $Gait Training: 8-22 mins $Therapeutic Activity: 8-22 mins        Bassel Gaskill Pettis, PT,  DPT Acute Rehabilitation Services  Pager: [phone] Office: [place] 07/22/2021, 3:25 PM

## 2021-07-22 NOTE — Plan of Care (Signed)

## 2021-07-22 NOTE — Consult Note (Addendum)
Attending physician's note   I have taken an interval history, reviewed the chart and examined the patient. I agree with the Advanced Practitioner's note, impression, and recommendations as outlined.   80 year old female with medical history as outlined below, to include severe pulmonary hypertension with RV failure, CHF, mitral stenosis, severe aortic stenosis, OSA, CKD 3, DVT, along with history of AVMs with significant GI evaluation at [facility] as outlined below.  GI service consulted for input on anemia presumed to be ABLA 2/2 recurrent AVM disease.  I had a long conversation with the patient and her daughter-in-law at bedside today.  She did have a push enteroscopy last month at [facility] which was largely unrevealing, but has a prior history of AVM disease with previous APC.  We discussed the role/utility of repeat push enteroscopy as an inpatient with potential treatment of AVMs and if negative endoscopic placement of VCE to evaluate for more distal pathology.  Based on her current overall health, she does not want to proceed with endoscopic evaluation.  Given her extensive work-up to date and increased periprocedural risks, feel this is a very reasonable decision on her part.  Additionally, Palliative Care has been consulted as patient considers transition of care.  - Started on octreotide SQ today, but may be cost prohibitive to continue in the outpatient setting.  Similarly, if she were to transition [place] to hospice, not sure that BID SQ injections would be in line with those goals, and would likely discontinue. 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 She did okay following last EGD with APC November 2021 but recurrent melena and anemia requiring IV iron and PRBCs over the last month. Small bowel endoscopy 10/13 was negative for AVMs. Admitted with acute on chronic heart failure but also ongoing melena / progressive anemia.             --Will discuss with Dr Bryan Lemma to see if he wants to repeat deep enteroscopy vrs support with transfusions / IV iron.  --According to GI notes by [facility], Octreotide wasn't covered by Medicare  # Pulmonary HTN / acute on chronic heart failure / mitral valve and aortic valve stenosis.   # Additional medical problems / medical history listed below.   HISTORY OF PRESENT ILLNESS  Chief Complaint:  GI bleed  Alisha [name] is a 80 y.o. female with a past medical history significant for CAD, chronic diastolic heart failure, severe pulmonary HTN, moderate to severe aortic stenosis, OSA on CPAP, DM, CKD IV, DVT, adenomatous colon polyps, gastroparesis.  See PMH for any additional medical problems.    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  She has periodically required blood transfusions and iron infusions.She had been doing well since last EGD with APC in November. Then sometime in late September developed recurrent melena. She had an urgent telemedicine visit with [facility] on 06/15/21. Hgb was 9.3, down from 12 in July. She subsequently underwent small bowel endoscopy on 10/13. Scope advanced deep into the jejunum and no AVM were found.  She has since received more IV iron and blood transfusion. Melena has continued. She was admitted 10/5 with edema, dyspnea, worsening renal function and worsening anemia. Hgb was 6.8. She got a uPRBC yesterday, hgb improved to  8.5 today. BMs continue to be runny and loose. She has no abdominal pain, nausea / vomiting.   Heart Failure team is following for acute on chronic heart failure. There is concern for progressive valvular heart disease leading to progressive cardiorenal syndrome.  TAVR for AV stenosis considered but MV looks too calcified and she is high risk for surgery. Palliative Medicine is following.    Imaging:  ECHO TEE  Result Date: 07/21/2021    TRANSESOPHOGEAL ECHO REPORT   Patient Name:   Alisha [name] [facility] Date of Exam: 07/21/2021 Medical Rec #:  [id number]      Height:       65.0 in Accession #:    [id number]     Weight:       151.2 lb Date of Birth:  26-Jul-1941     BSA:          1.757 m Patient Age:    85 years       BP:           105/60 mmHg Patient Gender: F              HR:           70 bpm. Exam Location:  Inpatient Procedure: Transesophageal Echo, Color Doppler, 3D Echo and Cardiac Doppler Indications:    AS, Alisha  History:        Patient has prior history of Echocardiogram examinations.  Sonographer:    Dustin Flock RDCS Referring Phys: [phone] Wilmer Floor SIMMONS PROCEDURE: The transesophogeal probe was passed without difficulty through the esophogus of the patient. Sedation performed by different physician. The patient developed no complications during the procedure. IMPRESSIONS  1. Left ventricular ejection fraction, by estimation, is 60 to 65%. The left ventricle has normal function. The left ventricle has no regional wall motion abnormalities. There is mild left ventricular hypertrophy.  2. Peak RV-RA gradient 40 mmHg. Right ventricular systolic function is mildly reduced. The right ventricular size is moderately enlarged.  3. Left atrial size was moderately dilated. No left atrial/left atrial appendage thrombus was detected.  4. Right atrial size was moderately dilated.  5. 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 Mild to moderate mitral valve regurgitation. Moderate mitral stenosis. The mean  mitral valve gradient is 6.0 mmHg with MVA 1.3 cm^2 by VTI. Moderate to severe mitral annular calcification.  6. The aortic valve is tricuspid. Aortic valve regurgitation is mild. Severe aortic valve  stenosis. Aortic valve area, by VTI measures 0.87 cm. Aortic valve mean gradient measures 46.0 mmHg.  7. A small pericardial effusion is present. FINDINGS  Left Ventricle: Left ventricular ejection fraction, by estimation, is 60 to 65%. The left ventricle has normal function. The left ventricle has no regional wall motion abnormalities. The left ventricular internal cavity size was normal in size. There is  mild left ventricular hypertrophy. Right Ventricle: Peak RV-RA gradient 40 mmHg. The right ventricular size is moderately enlarged. No increase in right ventricular wall thickness. Right ventricular systolic function is mildly reduced. Left Atrium: Left atrial size was moderately dilated. No left atrial/left atrial appendage thrombus was detected. Right Atrium: Right atrial size was moderately dilated. Pericardium: A small pericardial effusion is present. Mitral Valve: The mitral valve is rheumatic. There is moderate calcification of the mitral valve leaflet(s). Moderate to severe mitral annular calcification. Mild to moderate mitral valve regurgitation. Moderate mitral valve stenosis. MV peak gradient, 16.2 mmHg. The mean mitral valve gradient is 6.0 mmHg. Tricuspid Valve: The tricuspid valve is normal in structure. Tricuspid valve regurgitation is mild. Aortic Valve: The aortic valve is tricuspid. Aortic valve regurgitation is mild. Severe aortic stenosis is present. Aortic valve mean gradient measures 46.0 mmHg. Aortic valve peak gradient measures 74.1 mmHg. Aortic valve area, by VTI measures 0.87 cm. Pulmonic Valve: The pulmonic valve was normal in structure. Pulmonic valve regurgitation is not visualized. Aorta: The aortic root is normal in size and structure. IAS/Shunts: No atrial level shunt detected by  color flow Doppler.  LEFT VENTRICLE PLAX 2D LVOT diam:     2.00 cm LV SV:         83 LV SV Index:   47 LVOT Area:     3.14 cm  AORTIC VALVE AV Area (Vmax):    0.89 cm AV Area (Vmean):   0.80 cm AV Area (VTI):     0.87 cm AV Vmax:           430.50 cm/s AV Vmean:          303.500 cm/s AV VTI:            0.953 m AV Peak Grad:      74.1 mmHg AV Mean Grad:      46.0 mmHg LVOT Vmax:         122.00 cm/s LVOT Vmean:        76.900 cm/s LVOT VTI:          0.264 m LVOT/AV VTI ratio: 0.28 MITRAL VALVE               TRICUSPID VALVE MV Area (PHT): 1.83 cm    TR Peak grad:   39.7 mmHg MV Area VTI:   1.30 cm    TR Vmax:        315.00 cm/s MV Peak grad:  16.2 mmHg MV Mean grad:  6.0 mmHg    SHUNTS MV Vmax:       2.01 m/s    Systemic VTI:  0.26 m MV Vmean:      113.3 cm/s  Systemic Diam: 2.00 cm Dalton McleanMD Electronically signed by Franki Monte Signature Date/Time: 07/21/2021/2:35:24 PM    Final        PREVIOUS ENDOSCOPIC EVALUATIONS  / IMAGING STUDIES   06/24/21 Small bowel enteroscopy for melena Postprocedure Impression:  80 year old female with a history of gastrointestinal AVMs who presents  today for small bowel enteroscopy.  We advanced deep into the jejunum and  I did not  see any AVM. She did not require APC.    Per GI note in Care Everywhere Summary of GI workup EGD/small bowel push enteroscopy 07/2020 (IDA): normal esophagus, small sliding HH, erythema in the antrum/body, oozing small angioectasia s/p APC, remaining small bowel to proximal jejunum was normal/  Colonoscopy 07/2020: recurrent IDA: normal TI, 42mm cecum--TA, 9mm AC--SSP, two 6mm AC--TA, 5mm HF--normal mucosa, diverticulosis, ulcerated hemorrhoid, repeat colonoscopy in 07/2025 pending stability in clinical status/ Esophagram 09/2019 (abnormal GES): normal pharynx, normal esophagus mucosa, esophageal dysmotility with stagnation of contrast in the distal esophagus (cleared with upright positioning), no mass or stricture, small hiatal  hernia.  GES 09/2019: tracer in the esophagus throughout the exam; gastric retention: 60min 100%, 60 min 63% 129min 55%, 19min 32% (normal 10-29%), 234min 16% 0-9% VCE 08/2019: inconclusive study due to gastric retention CTA 06/2019: colonic diverticulosis, atrophic kidneys, aortobi-iliac atherosclerotic changes, small hh, cholelithiasis, normal pancreas, osteopniea. Fat containing periumbilical hernia 02/2228 EGD: small HH and some non-erosive gastropathy and biopsies were neg for H pylori.  - 12/2017 colonoscopy: 48mm sigmoid tubulovillous adenoma, four 2-29mm TAs,(8 polyps in total 3 adenomatous appearing but not retrieved), diverticulosis in the entire colon, worse in the left colon.  Moderate internal hemorrhoids, a 1 cm area of superficial ulcerated mucosa at the distal rectum (biopsy shows solitary rectal ulcer/mucosal prolapse polyp).  BBPS 7. recall in 3 years - 01/2018 capsule endoscopy: questionable diminutive AVMs in the proximal jejunum.     Past Medical History:  Diagnosis Date  . Aortic atherosclerosis ([place]) 08/30/2017  . Aortic stenosis 08/30/2017  . CAD (coronary artery disease), native coronary artery 08/30/2017  . CHF (congestive heart failure) ([place])   . Chronic diastolic heart failure ([place]) 08/30/2017  . Diabetes mellitus with peripheral vascular disease ([place]) 07/14/2014  . Hypertensive heart disease without CHF 08/30/2017  . Hypothyroidism (acquired) 08/30/2017  . Pulmonary hypertension ([place]) 07/04/2017    Past Surgical History:  Procedure Laterality Date  . ANKLE SURGERY    . APPENDECTOMY    . DILATION AND CURETTAGE OF UTERUS    . RIGHT HEART CATH N/A 01/01/2019   Procedure: RIGHT HEART CATH;  Surgeon: Larey Dresser, MD;  Location: [place] [facility];  Service: Cardiovascular;  Laterality: N/A;  . RIGHT HEART CATH N/A 07/19/2021   Procedure: RIGHT HEART CATH;  Surgeon: Larey Dresser, MD;  Location: [place] [facility];  Service: Cardiovascular;  Laterality: N/A;  .  RIGHT/LEFT HEART CATH AND CORONARY ANGIOGRAPHY N/A 09/27/2017   Procedure: RIGHT/LEFT HEART CATH AND CORONARY ANGIOGRAPHY;  Surgeon: Sherren Mocha, MD;  Location: [place] [facility];  Service: Cardiovascular;  Laterality: N/A;  . TEE WITHOUT CARDIOVERSION N/A 01/07/2021   Procedure: TRANSESOPHAGEAL ECHOCARDIOGRAM (TEE);  Surgeon: Larey Dresser, MD;  Location: [facility] ENDOSCOPY;  Service: Cardiovascular;  Laterality: N/A;  . TEE WITHOUT CARDIOVERSION N/A 07/21/2021   Procedure: TRANSESOPHAGEAL ECHOCARDIOGRAM (TEE);  Surgeon: Larey Dresser, MD;  Location: [facility] ENDOSCOPY;  Service: Cardiovascular;  Laterality: N/A;    Prior to Admission medications   Medication Sig Start Date End Date Taking? Authorizing Provider  ADEMPAS 2.5 MG TABS TAKE 1 TABLET THREE TIMES A DAY Patient taking differently: Take 2.5 mg by mouth 3 (three) times daily. 05/24/21  Yes Larey Dresser, MD  amLODipine (NORVASC) 10 MG tablet Take 1 tablet (10 mg total) by mouth daily. 04/27/21  Yes Larey Dresser, MD  atorvastatin (LIPITOR) 20 MG tablet TAKE 1 TABLET BY MOUTH IN  THE MORNING Patient taking differently:  Take 20 mg by mouth daily. 01/12/21  Yes Saguier, Percell Miller, PA-C  cetirizine (ZYRTEC) 10 MG tablet Take 10 mg by mouth daily as needed for allergies.   Yes [provider]  cholecalciferol (VITAMIN D) 25 MCG (1000 UNIT) tablet Take 1,000 Units by mouth daily.   Yes [provider]  fenofibrate 160 MG tablet Take 1 tablet (160 mg total) by mouth every morning. 11/17/20  Yes Saguier, Percell Miller, PA-C  glucose blood (ACCU-CHEK AVIVA PLUS) test strip Check blood sugar TID dx: E11.9 04/07/21  Yes Saguier, Percell Miller, PA-C  levothyroxine (SYNTHROID) 125 MCG tablet Take 1 tablet (125 mcg total) by mouth daily before breakfast. 11/17/20 05/26/22 Yes Saguier, Percell Miller, PA-C  loperamide (IMODIUM A-D) 2 MG tablet Take 2 mg by mouth daily as needed for diarrhea or loose stools.   Yes [provider]  loratadine (CLARITIN) 10 MG  tablet Take 10 mg by mouth daily as needed for allergies.    Yes [provider]  metFORMIN (GLUCOPHAGE) 500 MG tablet Take 1 tablet (500 mg total) by mouth 2 (two) times daily with a meal. Patient taking differently: No sig reported 11/17/20  Yes Saguier, Percell Miller, PA-C  metolazone (ZAROXOLYN) 2.5 MG tablet Take 1 tablet (2.5 mg total) by mouth once a week. Take potassium 95meq with this Patient taking differently: Take 2.5 mg by mouth every Saturday at 6 PM. 06/08/20  Yes Larey Dresser, MD  metoprolol succinate (TOPROL XL) 25 MG 24 hr tablet Take 1 tablet (25 mg total) by mouth 2 (two) times daily. 11/17/20 11/17/21 Yes Saguier, Percell Miller, PA-C  Multiple Vitamins-Minerals (PRESERVISION AREDS 2 PO) Take 1 capsule by mouth 2 (two) times daily.   Yes [provider]  pantoprazole (PROTONIX) 40 MG tablet TAKE 1 TABLET BY MOUTH  TWICE DAILY Patient taking differently: Take 40 mg by mouth 2 (two) times daily. 04/07/21  Yes Saguier, Percell Miller, PA-C  torsemide (DEMADEX) 20 MG tablet Take 2 tablets (40 mg total) by mouth 2 (two) times daily. Patient taking differently: Take 20 mg by mouth 2 (two) times daily. 07/05/21  Yes Joette Catching, PA-C  UPTRAVI 1600 MCG TABS Take 1 tablet (1,600 mcg total) by mouth 2 (two) times daily. Take with 800 for a total of 2400 mg 01/22/21  Yes Larey Dresser, MD  UPTRAVI 800 MCG TABS Take 1 tablet (800 mcg total) by mouth 2 (two) times daily. Take with 1600 mcg for a total of 2400 mg 01/22/21  Yes Larey Dresser, MD  allopurinol (ZYLOPRIM) 100 MG tablet Take 1 tablet (100 mg total) by mouth daily. Patient not taking: No sig reported 05/26/21   Collier Salina, MD  ferrous sulfate 325 (65 FE) MG tablet Take 1 tablet (325 mg total) by mouth daily with breakfast. Morning and night 11/17/20 05/26/22  Saguier, Percell Miller, PA-C  potassium chloride (KLOR-CON) 10 MEQ tablet Take 3 tablets (30 mEq total) by mouth daily. Take an extra 34meq with metolazone Patient taking  differently: Take 30 mEq by mouth See admin instructions. 30 meq once daily Take an extra 59meq with metolazone 07/09/21   Larey Dresser, MD  traMADol (ULTRAM) 50 MG tablet Take 1 tablet (50 mg total) by mouth every 6 (six) hours as needed for moderate pain or severe pain. Patient not taking: No sig reported 05/10/21   Saguier, Percell Miller, PA-C    Current Facility-Administered Medications  Medication Dose Route Frequency Provider Last Rate Last Admin  . 0.9 %  sodium chloride infusion  250 mL Intravenous PRN Larey Dresser, MD      . acetaminophen (TYLENOL) tablet 650 mg  650 mg Oral Q4H PRN Larey Dresser, MD      . atorvastatin (LIPITOR) tablet 20 mg  20 mg Oral Daily Opyd, Ilene Qua, MD   20 mg at 07/22/21 0937  . furosemide (LASIX) injection 80 mg  80 mg Intravenous BID Larey Dresser, MD   80 mg at 07/22/21 0936  . levothyroxine (SYNTHROID) tablet 125 mcg  125 mcg Oral QAC breakfast Vianne Bulls, MD   125 mcg at 07/22/21 1914  . metoprolol succinate (TOPROL-XL) 24 hr tablet 25 mg  25 mg Oral BID Charlynne Cousins, MD   25 mg at 07/22/21 0939  . ondansetron (ZOFRAN) injection 4 mg  4 mg Intravenous Q6H PRN Larey Dresser, MD      . pantoprazole (PROTONIX) EC tablet 40 mg  40 mg Oral BID Vianne Bulls, MD   40 mg at 07/22/21 0937  . potassium chloride SA (KLOR-CON) CR tablet 40 mEq  40 mEq Oral Once Lyndee Leo, [facility]      . Riociguat TABS 2.5 mg  2.5 mg Oral TID Arnoldo Lenis, MD   2.5 mg at 07/22/21 0940  . Selexipag TABS 1,600 mcg  1,600 mcg Oral BID Vianne Bulls, MD   1,600 mcg at 07/22/21 0942  . Selexipag TABS 800 mcg  800 mcg Oral BID Vianne Bulls, MD   800 mcg at 07/22/21 0941  . sodium bicarbonate tablet 650 mg  650 mg Oral TID Vianne Bulls, MD   650 mg at 07/22/21 0938  . sodium chloride flush (NS) 0.9 % injection 3 mL  3 mL Intravenous Q12H Opyd, Ilene Qua, MD   3 mL at 07/20/21 0924  . sodium chloride flush (NS) 0.9 % injection 3 mL  3 mL Intravenous  Q12H Larey Dresser, MD   3 mL at 07/21/21 0829  . sodium chloride flush (NS) 0.9 % injection 3 mL  3 mL Intravenous Q12H Larey Dresser, MD   3 mL at 07/22/21 0943  . sodium chloride flush (NS) 0.9 % injection 3 mL  3 mL Intravenous PRN Larey Dresser, MD        Allergies as of 07/16/2021 - Review Complete 07/16/2021  Allergen Reaction Noted  . Other Palpitations 09/19/2017  . Adhesive [tape] Other (See Comments) 09/19/2017  . Nickel Rash 01/07/2021    Family History  Problem Relation Age of Onset  . Diabetes Mother   . Bleeding Disorder Mother   . Heart disease Father   . Alzheimer's disease Father   . Diabetes Sister   . Diabetes Brother   . Heart disease Brother   . Healthy Daughter   . Healthy Son     Social History   Socioeconomic History  . Marital status: Divorced    Spouse name: Not on file  . Number of children: Not on file  . Years of education: Not on file  . Highest education level: Not on file  Occupational History  . Not on file  Tobacco Use  . Smoking status: Never  . Smokeless tobacco: Never  Vaping Use  . Vaping Use: Never used  Substance and Sexual Activity  . Alcohol use: Yes    Comment: Occasionally  . Drug use: No  . Sexual activity: Never  Other Topics Concern  . Not on file  Social History Narrative  .  Not on file   Social Determinants of Health   Financial Resource Strain: Not on file  Food Insecurity: Not on file  Transportation Needs: Not on file  Physical Activity: Not on file  Stress: Not on file  Social Connections: Not on file  Intimate Partner Violence: Not on file    Review of Systems: All systems reviewed and negative except where noted in HPI.   OBJECTIVE    Physical Exam: Vital signs in last 24 hours: Temp:  [97.4 F (36.3 C)-97.9 F (36.6 C)] 97.9 F (36.6 C) (11/10 0639) Pulse Rate:  [72-90] 74 (11/10 0939) Resp:  [16-24] 21 (11/10 0639) BP: (95-137)/(39-66) 126/66 (11/10 0939) SpO2:  [92 %-100 %]  95 % (11/10 0639) Weight:  [68.1 kg-68.6 kg] 68.1 kg (11/10 0639) Last BM Date: 07/21/21  General:  Alert female in NAD Psych:  Pleasant, cooperative. Normal mood and affect Eyes: Pupils equal, no icterus. Conjunctive pink Ears:  Normal auditory acuity Nose: No deformity, discharge or lesions Neck:  Supple, no masses felt Lungs:  a few bibasilar crackles.   Heart:  Regular rate, regular rhythm, + murmur, 1+ BLE edema Abdomen:  Soft, nondistended, nontender, active bowel sounds, no masses felt Rectal :  Deferred Msk: Symmetrical without gross deformities.  Neurologic:  Alert, oriented, grossly normal neurologically Skin:  Intact without significant lesions.    Scheduled inpatient medications . atorvastatin  20 mg Oral Daily  . furosemide  80 mg Intravenous BID  . levothyroxine  125 mcg Oral QAC breakfast  . metoprolol succinate  25 mg Oral BID  . pantoprazole  40 mg Oral BID  . potassium chloride  40 mEq Oral Once  . Riociguat  2.5 mg Oral TID  . Selexipag  1,600 mcg Oral BID  . Selexipag  800 mcg Oral BID  . sodium bicarbonate  650 mg Oral TID  . sodium chloride flush  3 mL Intravenous Q12H  . sodium chloride flush  3 mL Intravenous Q12H  . sodium chloride flush  3 mL Intravenous Q12H      Intake/Output from previous day: 11/09 0701 - 11/10 0700 In: 1336 [P.O.:340; I.V.:300; Blood:696] Out: 1450 [Urine:1450] Intake/Output this shift: Total I/O In: 118 [P.O.:118] Out: -    Lab Results: Recent Labs    07/20/21 0428 07/21/21 0259 07/21/21 1410 07/21/21 1546 07/22/21 0309  WBC 8.6 7.9  --   --  9.3  HGB 8.1* 6.8* 8.5* 9.4* 8.5*  HCT 26.5* 22.3* 25.0* 29.4* 27.5*  PLT 345 298  --   --  290   BMET Recent Labs    07/20/21 0428 07/21/21 0259 07/21/21 1410 07/22/21 0309  NA 138 138 143 138  K 3.7 3.4* 3.3* 3.4*  CL 108 108 108 108  CO2 17* 19*  --  21*  GLUCOSE 137* 113* 147* 133*  BUN 83* 83* 87* 87*  CREATININE 3.45* 3.40* 3.50* 3.19*  CALCIUM 8.5*  8.2*  --  8.4*   LFTs No results for input(s): PROT, ALBUMIN, AST, ALT, ALKPHOS, BILITOT, BILIDIR, IBILI in the last 72 hours. PT/INR No results for input(s): LABPROT, INR in the last 72 hours. Hepatitis Panel No results for input(s): HEPBSAG, HCVAB, HEPAIGM, HEPBIGM in the last 72 hours.   . CBC Latest Ref Rng & Units 07/22/2021 07/21/2021 07/21/2021  WBC 4.0 - 10.5 K/uL 9.3 - -  Hemoglobin 12.0 - 15.0 g/dL 8.5(L) 9.4(L) 8.5(L)  Hematocrit 36.0 - 46.0 % 27.5(L) 29.4(L) 25.0(L)  Platelets 150 - 400 K/uL 290 - -    .  CMP Latest Ref Rng & Units 07/22/2021 07/21/2021 07/21/2021  Glucose 70 - 99 mg/dL 133(H) 147(H) 113(H)  BUN 8 - 23 mg/dL 87(H) 87(H) 83(H)  Creatinine 0.44 - 1.00 mg/dL 3.19(H) 3.50(H) 3.40(H)  Sodium 135 - 145 mmol/L 138 143 138  Potassium 3.5 - 5.1 mmol/L 3.4(L) 3.3(L) 3.4(L)  Chloride 98 - 111 mmol/L 108 108 108  CO2 22 - 32 mmol/L 21(L) - 19(L)  Calcium 8.9 - 10.3 mg/dL 8.4(L) - 8.2(L)  Total Protein 6.5 - 8.1 g/dL - - -  Total Bilirubin 0.3 - 1.2 mg/dL - - -  Alkaline Phos 38 - 126 U/L - - -  AST 15 - 41 U/L - - -  ALT 0 - 44 U/L - - -     Principal Problem:   Acute renal failure superimposed on stage 4 chronic kidney disease (HCC) Active Problems:   Pulmonary hypertension, unspecified (HCC)   CAD (coronary artery disease), native coronary artery   Chronic diastolic heart failure (HCC)   AVM (arteriovenous malformation) of small bowel, acquired   Prolonged QT interval    Tye Savoy, NP-C @  07/22/2021, 11:19 AM

## 2021-07-22 NOTE — Anesthesia Postprocedure Evaluation (Signed)
Anesthesia Post Note  Patient: Alisha [name]  Procedure(s) Performed: TRANSESOPHAGEAL ECHOCARDIOGRAM (TEE)     Patient location during evaluation: Endoscopy Anesthesia Type: MAC Level of consciousness: awake and alert Pain management: pain level controlled Vital Signs Assessment: post-procedure vital signs reviewed and stable Respiratory status: spontaneous breathing, nonlabored ventilation, respiratory function stable and patient connected to nasal cannula oxygen Cardiovascular status: stable and blood pressure returned to baseline Postop Assessment: no apparent nausea or vomiting Anesthetic complications: no   No notable events documented.  Last Vitals:  Vitals:   07/22/21 0939 07/22/21 1155  BP: 126/66 (!) 104/59  Pulse: 74 75  Resp:  20  Temp:  36.8 C  SpO2:  91%    Last Pain:  Vitals:   07/22/21 1155  TempSrc: Oral  PainSc:                  [name]

## 2021-07-22 NOTE — Progress Notes (Signed)
SATURATION QUALIFICATIONS: (This note is used to comply with regulatory documentation for home oxygen)  Patient Saturations on Room Air at Rest = 89%  Patient Saturations on Room Air while Ambulating = 83%  Patient Saturations on 2 Liters of oxygen while Ambulating = 90%  Please briefly explain why patient needs home oxygen: Pt's sats decrease significantly without supplemental O2.   Moishe Spice, PT, DPT Acute Rehabilitation Services  Pager: [phone] Office: [phone]

## 2021-07-22 NOTE — Progress Notes (Signed)
PROGRESS NOTE    Alisha [name]  BSJ:[id number] DOB: 1941-02-21 DOA: 07/16/2021 PCP: Mackie Pai, PA-C   Brief Narrative:  Alisha [name] is an 80 y.o. 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  She relates that she had her diuretic recently increased as she was becoming short of breath and requiring oxygen.  Upon arrival to the ED she was satting in the low 90s blood pressure 117/58 bicarb of 13 creatinine of 4 with a BUN of 97 (with a baseline creatinine of 1.3-1.5)   Significant studies: RHC in 4/20 showed normal filling pressures but severe pulmonary hypertension with PVR 6 WU.  Echo showed moderately dilated RV with normal systolic function, mild-moderate MS, mild AS.   Right heart cath on 07/19/2021 showed elevated right and left heart filling pressure preserved cardiac output severe primary arterial hypertension   TEE 07/19/2021 pending   TEE was done in 4/22 to more closely assess the mitral and aortic valves, EF 60-65%, d-shaped septum, mild RV enlargement with mildly decreased RV systolic function, peak RV-RA gradient 38 mmHg, moderate AS with AVA 1.31 cm^2 mean gradient 26 mmHg, mild-moderate MR with mild-moderate MS mean gradient 7 with MVA 1.7 cm^2 by VTI .    Assessment & Plan:   Principal Problem:   Acute renal failure superimposed on stage 4 chronic kidney disease (HCC) Active Problems:   Pulmonary hypertension, unspecified (HCC)   CAD (coronary artery disease), native coronary artery   Chronic diastolic heart failure (HCC)   AVM (arteriovenous malformation) of small bowel, acquired   Prolonged QT interval  Acute renal failure superimposed on stage 4 chronic kidney disease/anion gap metabolic acidosis: Baseline creatinine seems to be around 1.8-1.9.  Presented with  4.3. Likely prerenal history  in the setting of torsemide and metolazone use. This was held she was given gentle IV fluid hydration.  Creatinine improved to 3.4, unfortunately she developed pulmonary edema seen on chest x-ray and BNP of 1700. Cardiology was consulted recommended right heart cath with results as below.  She is on bicarb tablets.  Acidosis improving.  CO2 21 today.  Hypokalemia: 3.4.  Will replace again.   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  Echo this admission with moderate-severe aortic stenosis (mean gradient 34 mmHg with AVA around 1.0 cm^2) and moderate-severe mitral stenosis (mean gradient 10 mmHg). Could potentially undergo TAVR for aortic valve but MV looks too calcified for valvuloplasty.  She would be a very high risk surgical patient Continue Adempas 2.5 mg tid.  Continue Uptravi 2400 mcg BID Unfortunately right heart cath showed elevated right and left filling pressures with preserved cardiac output. Cardiology recommended to continue IV Lasix and currently she is on 80 mg IV twice daily.  TEE completed 07/21/2021 confirmed moderate mitral stenosis but severe aortic stenosis.  Structural heart team to evaluate patient for consideration of possible TAVR.  Patient was seen by palliative care yesterday and according to the information that was provided, patient was interested in hospice with home if TAVR was not the option for her.  When I discussed this with her today, her son being present at the bedside, she appeared to be struggling with making any decision.  Normocytic chronic blood loss anemia in the setting of chronic GI blood loss: She has a history of AVMs.  She has had received 1 packed red blood cells on 07/15/2021. Her hemoglobin continues to slowly  drift down, went down to 6.8 07/21/2021, received another unit of PRBC transfusion.  Hemoglobin 9.5 yesterday and dropped to 8.5 again today.  Has had melena as well.  GI has been consulted.   Essential hypertension: Blood pressure was low but now within normal range, home dose of amlodipine on hold but continue Toprol-XL.   Prolonged QTC: Monitor on telemetry mag level pending.  Cardiology on board.  History of mitral valve stenosis: 2D echo as above.   DVT prophylaxis: SCD's Start: 07/19/21 1439 SCDs Start: 07/17/21 0622   Code Status: DNR  Family Communication: Son at bedside.  Plan of care discussed with patient and daughter.  Status is: Inpatient  Remains inpatient appropriate because: Needs further work-up.  Estimated body mass index is 24.98 kg/m as calculated from the following:   Height as of this encounter: 5\' 5"  (1.651 m).   Weight as of this encounter: 68.1 kg.     Nutritional Assessment: Body mass index is 24.98 kg/m.Marland Kitchen Seen by dietician.  I agree with the assessment and plan as outlined below: Nutrition Status:        .  Skin Assessment: I have examined the patient's skin and I agree with the wound assessment as performed by the wound care RN as outlined below:    Consultants:  Cardiology  Procedures:  As above  Antimicrobials:  Anti-infectives (From admission, onward)    None          Subjective: Patient seen and examined.  Patient's son at the bedside.  Patient still complains of shortness of breath but she states that she feels little improvement compared to yesterday.  Objective: Vitals:   07/21/21 2047 07/22/21 0639 07/22/21 0939 07/22/21 1155  BP: (!) 127/53 (!) 116/58 126/66 (!) 104/59  Pulse: 90 78 74 75  Resp: (!) 23 (!) 21  20  Temp: 97.6 F (36.4 C) 97.9 F (36.6 C)  98.2 F (36.8 C)  TempSrc: Oral Oral  Oral  SpO2: 95% 95%  91%  Weight:  68.1 kg    Height:        Intake/Output Summary (Last 24 hours) at 07/22/2021  1309 Last data filed at 07/22/2021 0848 Gross per 24 hour  Intake 1106 ml  Output 1050 ml  Net 56 ml    Filed Weights   07/21/21 0811 07/21/21 1302 07/22/21 0639  Weight: 68.6 kg 68.6 kg 68.1 kg    Examination:  General exam: Appears calm and comfortable  Respiratory system: Clear to auscultation. Respiratory effort normal. Cardiovascular system: S1 & S2 heard, RRR. No JVD, murmurs, rubs, gallops or clicks. No pedal edema. Gastrointestinal system: Abdomen is nondistended, soft and nontender. No organomegaly or masses felt. Normal bowel sounds heard. Central nervous system: Alert and oriented. No focal neurological deficits. Extremities: Symmetric 5 x 5 power. Skin: No rashes, lesions or ulcers.  Psychiatry: Judgement and insight appear normal. Mood & affect appropriate.   Data Reviewed: I have personally reviewed following labs and imaging studies  CBC: Recent Labs  Lab 07/16/21 2004 07/18/21 0727 07/19/21 0103 07/19/21 1513 07/20/21 0428 07/21/21 0259 07/21/21 1410 07/21/21 1546 07/22/21 0309  WBC 8.6 6.6 7.7  --  8.6 7.9  --   --  9.3  NEUTROABS 7.2  --   --   --   --   --   --   --   --  HGB 8.8* 7.8* 7.4*   < > 8.1* 6.8* 8.5* 9.4* 8.5*  HCT 28.0* 24.5* 23.5*   < > 26.5* 22.3* 25.0* 29.4* 27.5*  MCV 86.2 85.1 85.8  --  87.2 88.5  --   --  86.5  PLT 339 296 287  --  345 298  --   --  290   < > = values in this interval not displayed.    Basic Metabolic Panel: Recent Labs  Lab 07/18/21 0342 07/19/21 0103 07/19/21 1513 07/20/21 0428 07/21/21 0259 07/21/21 1410 07/22/21 0309  NA 137 136 139  140 138 138 143 138  K 3.7 3.3* 3.6  3.5 3.7 3.4* 3.3* 3.4*  CL 111 109  --  108 108 108 108  CO2 14* 18*  --  17* 19*  --  21*  GLUCOSE 131* 141*  --  137* 113* 147* 133*  BUN 89* 89*  --  83* 83* 87* 87*  CREATININE 3.78* 3.43*  --  3.45* 3.40* 3.50* 3.19*  CALCIUM 8.3* 8.2*  --  8.5* 8.2*  --  8.4*  MG 1.9  --   --  2.1  --   --   --     GFR: Estimated  Creatinine Clearance: 13.1 mL/min (A) (by C-G formula based on SCr of 3.19 mg/dL (H)). Liver Function Tests: Recent Labs  Lab 07/16/21 2004  AST 12*  ALT 9  ALKPHOS 54  BILITOT 0.6  PROT 6.4*  ALBUMIN 3.4*    No results for input(s): LIPASE, AMYLASE in the last 168 hours. No results for input(s): AMMONIA in the last 168 hours. Coagulation Profile: No results for input(s): INR, PROTIME in the last 168 hours. Cardiac Enzymes: No results for input(s): CKTOTAL, CKMB, CKMBINDEX, TROPONINI in the last 168 hours. BNP (last 3 results) Recent Labs    01/26/21 1313  PROBNP 823.0*    HbA1C: No results for input(s): HGBA1C in the last 72 hours. CBG: No results for input(s): GLUCAP in the last 168 hours. Lipid Profile: No results for input(s): CHOL, HDL, LDLCALC, TRIG, CHOLHDL, LDLDIRECT in the last 72 hours. Thyroid Function Tests: No results for input(s): TSH, T4TOTAL, FREET4, T3FREE, THYROIDAB in the last 72 hours. Anemia Panel: Recent Labs    07/20/21 1752  FERRITIN 60  TIBC 189*  IRON 27*    Sepsis Labs: No results for input(s): PROCALCITON, LATICACIDVEN in the last 168 hours.  Recent Results (from the past 240 hour(s))  Resp Panel by RT-PCR (Flu A&B, Covid) Nasopharyngeal Swab     Status: None   Collection Time: 07/17/21  3:05 AM   Specimen: Nasopharyngeal Swab; Nasopharyngeal(NP) swabs in vial transport medium  Result Value Ref Range Status   SARS Coronavirus 2 by RT PCR NEGATIVE NEGATIVE Final    Comment: (NOTE) SARS-CoV-2 target nucleic acids are NOT DETECTED.  The SARS-CoV-2 RNA is generally detectable in upper respiratory specimens during the acute phase of infection. The lowest concentration of SARS-CoV-2 viral copies this assay can detect is 138 copies/mL. A negative result does not preclude SARS-Cov-2 infection and should not be used as the sole basis for treatment or other patient management decisions. A negative result may occur with  improper specimen  collection/handling, submission of specimen other than nasopharyngeal swab, presence of viral mutation(s) within the areas targeted by this assay, and inadequate number of viral copies(<138 copies/mL). A negative result must be combined with clinical observations, patient history, and epidemiological information. The expected result is Negative.  Fact Sheet for  Patients:  EntrepreneurPulse.com.au  Fact Sheet for Healthcare Providers:  IncredibleEmployment.be  This test is no t yet approved or cleared by the [place] FDA and  has been authorized for detection and/or diagnosis of SARS-CoV-2 by FDA under an Emergency Use Authorization (EUA). This EUA will remain  in effect (meaning this test can be used) for the duration of the COVID-19 declaration under Section 564(b)(1) of the Act, 21 U.S.C.section 360bbb-3(b)(1), unless the authorization is terminated  or revoked sooner.       Influenza A by PCR NEGATIVE NEGATIVE Final   Influenza B by PCR NEGATIVE NEGATIVE Final    Comment: (NOTE) The Xpert Xpress SARS-CoV-2/FLU/RSV plus assay is intended as an aid in the diagnosis of influenza from Nasopharyngeal swab specimens and should not be used as a sole basis for treatment. Nasal washings and aspirates are unacceptable for Xpert Xpress SARS-CoV-2/FLU/RSV testing.  Fact Sheet for Patients: EntrepreneurPulse.com.au  Fact Sheet for Healthcare Providers: IncredibleEmployment.be  This test is not yet approved or cleared by the [place] FDA and has been authorized for detection and/or diagnosis of SARS-CoV-2 by FDA under an Emergency Use Authorization (EUA). This EUA will remain in effect (meaning this test can be used) for the duration of the COVID-19 declaration under Section 564(b)(1) of the Act, 21 U.S.C. section 360bbb-3(b)(1), unless the authorization is terminated or revoked.  Performed at [facility], [address], [place], [place] [address]        Radiology Studies: ECHO TEE  Result Date: 07/21/2021    TRANSESOPHOGEAL ECHO REPORT   Patient Name:   KADANCE MCCUISTION [facility] Date of Exam: 07/21/2021 Medical Rec #:  [id number]      Height:       65.0 in Accession #:    [id number]     Weight:       151.2 lb Date of Birth:  September 06, 1941     BSA:          1.757 m Patient Age:    43 years       BP:           105/60 mmHg Patient Gender: F              HR:           70 bpm. Exam Location:  Inpatient Procedure: Transesophageal Echo, Color Doppler, 3D Echo and Cardiac Doppler Indications:    AS, MS  History:        Patient has prior history of Echocardiogram examinations.  Sonographer:    Dustin Flock RDCS Referring Phys: [phone] Wilmer Floor SIMMONS PROCEDURE: The transesophogeal probe was passed without difficulty through the esophogus of the patient. Sedation performed by different physician. The patient developed no complications during the procedure. IMPRESSIONS  1. Left ventricular ejection fraction, by estimation, is 60 to 65%. The left ventricle has normal function. The left ventricle has no regional wall motion abnormalities. There is mild left ventricular hypertrophy.  2. Peak RV-RA gradient 40 mmHg. Right ventricular systolic function is mildly reduced. The right ventricular size is moderately enlarged.  3. Left atrial size was moderately dilated. No left atrial/left atrial appendage thrombus was detected.  4. Right atrial size was moderately dilated.  5. 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 Mild to moderate mitral valve regurgitation. Moderate mitral stenosis. The mean mitral valve gradient is 6.0 mmHg with MVA 1.3 cm^2 by VTI. Moderate to severe mitral annular calcification.  6. The aortic valve is tricuspid. Aortic valve  regurgitation is mild. Severe aortic valve stenosis. Aortic valve area, by VTI measures 0.87 cm. Aortic valve mean gradient measures 46.0 mmHg.  7. A small  pericardial effusion is present. FINDINGS  Left Ventricle: Left ventricular ejection fraction, by estimation, is 60 to 65%. The left ventricle has normal function. The left ventricle has no regional wall motion abnormalities. The left ventricular internal cavity size was normal in size. There is  mild left ventricular hypertrophy. Right Ventricle: Peak RV-RA gradient 40 mmHg. The right ventricular size is moderately enlarged. No increase in right ventricular wall thickness. Right ventricular systolic function is mildly reduced. Left Atrium: Left atrial size was moderately dilated. No left atrial/left atrial appendage thrombus was detected. Right Atrium: Right atrial size was moderately dilated. Pericardium: A small pericardial effusion is present. Mitral Valve: The mitral valve is rheumatic. There is moderate calcification of the mitral valve leaflet(s). Moderate to severe mitral annular calcification. Mild to moderate mitral valve regurgitation. Moderate mitral valve stenosis. MV peak gradient, 16.2 mmHg. The mean mitral valve gradient is 6.0 mmHg. Tricuspid Valve: The tricuspid valve is normal in structure. Tricuspid valve regurgitation is mild. Aortic Valve: The aortic valve is tricuspid. Aortic valve regurgitation is mild. Severe aortic stenosis is present. Aortic valve mean gradient measures 46.0 mmHg. Aortic valve peak gradient measures 74.1 mmHg. Aortic valve area, by VTI measures 0.87 cm. Pulmonic Valve: The pulmonic valve was normal in structure. Pulmonic valve regurgitation is not visualized. Aorta: The aortic root is normal in size and structure. IAS/Shunts: No atrial level shunt detected by color flow Doppler.  LEFT VENTRICLE PLAX 2D LVOT diam:     2.00 cm LV SV:         83 LV SV Index:   47 LVOT Area:     3.14 cm  AORTIC VALVE AV Area (Vmax):    0.89 cm AV Area (Vmean):   0.80 cm AV Area (VTI):     0.87 cm AV Vmax:           430.50 cm/s AV Vmean:          303.500 cm/s AV VTI:            0.953 m AV  Peak Grad:      74.1 mmHg AV Mean Grad:      46.0 mmHg LVOT Vmax:         122.00 cm/s LVOT Vmean:        76.900 cm/s LVOT VTI:          0.264 m LVOT/AV VTI ratio: 0.28 MITRAL VALVE               TRICUSPID VALVE MV Area (PHT): 1.83 cm    TR Peak grad:   39.7 mmHg MV Area VTI:   1.30 cm    TR Vmax:        315.00 cm/s MV Peak grad:  16.2 mmHg MV Mean grad:  6.0 mmHg    SHUNTS MV Vmax:       2.01 m/s    Systemic VTI:  0.26 m MV Vmean:      113.3 cm/s  Systemic Diam: 2.00 cm Dalton McleanMD Electronically signed by Franki Monte Signature Date/Time: 07/21/2021/2:35:24 PM    Final     Scheduled Meds:  atorvastatin  20 mg Oral Daily   furosemide  80 mg Intravenous BID   levothyroxine  125 mcg Oral QAC breakfast   metoprolol succinate  25 mg Oral BID   pantoprazole  40 mg Oral  BID   potassium chloride  40 mEq Oral Once   Riociguat  2.5 mg Oral TID   Selexipag  1,600 mcg Oral BID   Selexipag  800 mcg Oral BID   sodium bicarbonate  650 mg Oral TID   sodium chloride flush  3 mL Intravenous Q12H   sodium chloride flush  3 mL Intravenous Q12H   sodium chloride flush  3 mL Intravenous Q12H   Continuous Infusions:  sodium chloride       LOS: 5 days   Time spent: 30 minutes   Darliss Cheney, MD [facility]  07/22/2021, 1:09 PM  Please page via Shea Evans and do not message via secure chat for anything urgent. Secure chat can be used for anything non urgent.  How to contact the [facility] Attending or Consulting provider [place] or covering provider during after hours [place], for this patient?  Check the care team in [facility] and look for a) attending/consulting TRH provider listed and b) the [facility] team listed. Page or secure chat [phone]. Log into [email or web address] and use [place]'s universal password to access. If you do not have the password, please contact the hospital operator. Locate the [facility] provider you are looking for under [facility] and page to a number that you can be directly reached. If  you still have difficulty reaching the provider, please page the [facility] (Director on Call) for the Hospitalists listed on amion for assistance.

## 2021-07-23 DIAGNOSIS — N179 Acute kidney failure, unspecified: Secondary | ICD-10-CM | POA: Diagnosis not present

## 2021-07-23 DIAGNOSIS — N184 Chronic kidney disease, stage 4 (severe): Secondary | ICD-10-CM | POA: Diagnosis not present

## 2021-07-23 DIAGNOSIS — I272 Pulmonary hypertension, unspecified: Secondary | ICD-10-CM | POA: Diagnosis not present

## 2021-07-23 LAB — BASIC METABOLIC PANEL
Anion gap: 9 (ref 5–15)
BUN: 76 mg/dL — ABNORMAL HIGH (ref 8–23)
CO2: 23 mmol/L (ref 22–32)
Calcium: 8.3 mg/dL — ABNORMAL LOW (ref 8.9–10.3)
Chloride: 106 mmol/L (ref 98–111)
Creatinine, Ser: 3.13 mg/dL — ABNORMAL HIGH (ref 0.44–1.00)
GFR, Estimated: 15 mL/min — ABNORMAL LOW (ref >60)
Glucose, Bld: 128 mg/dL — ABNORMAL HIGH (ref 70–99)
Potassium: 4 mmol/L (ref 3.5–5.1)
Sodium: 138 mmol/L (ref 135–145)

## 2021-07-23 LAB — CBC
HCT: 28.1 % — ABNORMAL LOW (ref 36.0–46.0)
Hemoglobin: 8.7 g/dL — ABNORMAL LOW (ref 12.0–15.0)
MCH: 27.1 pg (ref 26.0–34.0)
MCHC: 31 g/dL (ref 30.0–36.0)
MCV: 87.5 fL (ref 80.0–100.0)
Platelets: 283 10*3/uL (ref 150–400)
RBC: 3.21 MIL/uL — ABNORMAL LOW (ref 3.87–5.11)
RDW: 18.8 % — ABNORMAL HIGH (ref 11.5–15.5)
WBC: 8.4 10*3/uL (ref 4.0–10.5)
nRBC: 0 % (ref 0.0–0.2)

## 2021-07-23 MED ORDER — TORSEMIDE 20 MG PO TABS
60.0000 mg | ORAL_TABLET | Freq: Two times a day (BID) | ORAL | Status: DC
  Administered 2021-07-23 – 2021-07-24 (×3): 60 mg via ORAL
  Filled 2021-07-23 (×3): qty 3

## 2021-07-23 MED ORDER — OCTREOTIDE ACETATE 20 MG IM KIT
20.0000 mg | PACK | Freq: Once | INTRAMUSCULAR | Status: AC
  Administered 2021-07-24: 20 mg via INTRAMUSCULAR
  Filled 2021-07-23: qty 1

## 2021-07-23 NOTE — TOC Progression Note (Addendum)
Transition of Care [facility]) - Progression Note    Patient Details  Name: Alisha [name] MRN: [id number] Date of Birth: [birth year]  Transition of Care [facility]) CM/SW [place], [place] Phone Number: 07/23/2021, 10:17 AM  Clinical Narrative:    HF CSW reached out to Alisha [name] [phone] to follow up about the medication discussion from yesterday however she didn't pick up the phone and CSW left her a voicemail to please return the call to the pharmacist at [phone].  CSW spoke with the patient's daughter, Alisha [name] [phone] to update her regarding [facility] and not being able to get in touch with Alisha [name] to discuss about the medications. CSW and Alisha [name] briefly discussed alternative plans if [facility] isn't an option including her mom coming home with her and needing DME set up. 12:59pm - HF CSW received a call back from Alisha [name] [phone] from [place] who declined the long term bed for Alisha [name] at this time and cannot extend an offer. Home health services and DME will need to be set up for Alisha [name]. CSW updated the patients daughter, Alisha [name]. CSW reached out to the [facility] to help assist with [facility] services.  CSW will continue to follow throughout discharge.   Expected Discharge Plan: [place] Barriers to Discharge: Continued Medical Work up  Expected Discharge Plan and Services Expected Discharge Plan: [place] In-house Referral: Clinical Social Work Discharge Planning Services: CM Consult Post Acute Care Choice: [place]                                         Social Determinants of Health (SDOH) Interventions    Readmission Risk Interventions No flowsheet data found.  Bowdy Bair, MSW, [place] Heart Failure Social Worker

## 2021-07-23 NOTE — Progress Notes (Signed)
Mobility Specialist: Progress Note   07/23/21 1110  Mobility  Activity Ambulated in hall  Level of Assistance Contact guard assist, steadying assist  Assistive Device None  Distance Ambulated (ft) 430 ft  Mobility Ambulated with assistance in hallway  Mobility Response Tolerated well  Mobility performed by Mobility specialist  $Mobility charge 1 Mobility   Pre-Mobility: 75 HR, 100% SpO2 Post-Mobility: 88-92%  SpO2  Pt ambulated on 2 L/min [place]. Pt stopped for one standing break halfway through ambulation d/t feeling SOB, otherwise had no c/o. After returning to room pt satting 87% on 2 L/min [place] and coached through purse lipped breathing. Sats increased to 93%. Pt set up to take shower with pt's daughter present in the room, RN aware.   [facility] Paralee Pendergrass Mobility Specialist Mobility Specialist Phone: [phone]

## 2021-07-23 NOTE — Progress Notes (Signed)
PROGRESS NOTE    [name]  GYI:[id number] DOB: Jul 31, 1941 DOA: 07/16/2021 PCP: Mackie Pai, PA-C   Brief Narrative:  Alisha [name] is an 80 y.o. 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  She relates that she had her diuretic recently increased as she was becoming short of breath and requiring oxygen.  Upon arrival to the ED she was satting in the low 90s blood pressure 117/58 bicarb of 13 creatinine of 4 with a BUN of 97 (with a baseline creatinine of 1.3-1.5)   Significant studies: RHC in 4/20 showed normal filling pressures but severe pulmonary hypertension with PVR 6 WU.  Echo showed moderately dilated RV with normal systolic function, mild-moderate MS, mild AS.   Right heart cath on 07/19/2021 showed elevated right and left heart filling pressure preserved cardiac output severe primary arterial hypertension   TEE 07/19/2021 pending   TEE was done in 4/22 to more closely assess the mitral and aortic valves, EF 60-65%, d-shaped septum, mild RV enlargement with mildly decreased RV systolic function, peak RV-RA gradient 38 mmHg, moderate AS with AVA 1.31 cm^2 mean gradient 26 mmHg, mild-moderate MR with mild-moderate MS mean gradient 7 with MVA 1.7 cm^2 by VTI .    Assessment & Plan:   Principal Problem:   Acute renal failure superimposed on stage 4 chronic kidney disease (HCC) Active Problems:   Pulmonary hypertension, unspecified (HCC)   CAD (coronary artery disease), native coronary artery   Chronic diastolic heart failure (HCC)   AVM (arteriovenous malformation) of small bowel, acquired   Prolonged QT interval  Acute renal failure superimposed on stage 4 chronic kidney disease/anion gap metabolic acidosis: Baseline creatinine seems to be around 1.8-1.9.  Presented with  4.3. Likely prerenal history  in the setting of torsemide and metolazone use. This was held she was given gentle IV fluid hydration.  Creatinine improved to 3.4, unfortunately she developed pulmonary edema seen on chest x-ray and BNP of 1700. Cardiology was consulted recommended right heart cath with results as below.  She is on bicarb tablets.  Acidosis improved.  Creatinine has remained stable around 3.2 level now.  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  Echo this admission with moderate-severe aortic stenosis (mean gradient 34 mmHg with AVA around 1.0 cm^2) and moderate-severe mitral stenosis (mean gradient 10 mmHg). Unfortunately right heart cath showed elevated right and left filling pressures with preserved cardiac output.  She was on IV Lasix 80 mg twice daily.  TEE completed 07/21/2021 confirmed moderate mitral stenosis but severe aortic stenosis.  Structural heart team evaluated her and she was deemed not a candidate for TAVR due to being high risk candidate due to her severe pulmonary hypertension and significantly elevated creatinine.  Cardiology has switched her to oral diuretics now.  They recommend following medications Torsemide 60 mg BID -Metolazone once weekly on Saturday (do not take until 11/19) -KCL 20 mEq daily + extra 20 mEq with metolazone -Adempas 2.5 mg TID -Uptravi 2400 mg BID -Metoprolol xl 25 mg BID - Atorvastatin   Normocytic chronic blood loss anemia in the setting of chronic GI blood loss: She has a history of AVMs.  She has had received 1 packed red blood cells on 07/15/2021. Her hemoglobin drifted down to 6.8 07/21/2021, received another unit of PRBC transfusion.  Hemoglobin fairly stable.  Seen by GI.  Reportedly she had push  enteroscopy about a month ago which was negative.  No further procedures are planned per GI.  They however started her on octreotide subcutaneous.   Essential hypertension: Blood pressure labile.  Home dose of amlodipine on hold but continue Toprol-XL.   Prolonged QTC: Monitor on telemetry mag level pending.  Cardiology on board.  History of mitral valve stenosis: 2D echo as above.   DVT prophylaxis: SCD's Start: 07/19/21 1439 SCDs Start: 07/17/21 0622   Code Status: DNR  Family Communication: Daughter at bedside.  Plan of care discussed with patient and daughter.  Status is: Inpatient  Remains inpatient appropriate because: Needs further work-up.  Estimated body mass index is 24.56 kg/m as calculated from the following:   Height as of this encounter: 5\' 5"  (1.651 m).   Weight as of this encounter: 67 kg.     Nutritional Assessment: Body mass index is 24.56 kg/m.Marland Kitchen Seen by dietician.  I agree with the assessment and plan as outlined below: Nutrition Status:        .  Skin Assessment: I have examined the patient's skin and I agree with the wound assessment as performed by the wound care RN as outlined below:    Consultants:  Cardiology  Procedures:  As above  Antimicrobials:  Anti-infectives (From admission, onward)    None          Subjective: Patient seen and examined.  She feels better today her breathing is better.  She is down to 2 L of oxygen.  She had a lengthy discussion with Dr. Aundra Dubin.  She is not ready for hospice.  Her daughter plans to take her home for couple of days until her assisted living facility issue/logistics are sorted out.  She would like to be discharged tomorrow so her daughter can get everything ready for her.  Objective: Vitals:   07/22/21 1155 07/22/21 1937 07/23/21 0548 07/23/21 1227  BP: (!) 104/59 (!) 145/59 (!) 134/92 116/63  Pulse: 75  70 63  Resp: 20 17 15 17   Temp: 98.2 F (36.8 C) 98 F (36.7 C) 97.9 F (36.6 C)  98.3 F (36.8 C)  TempSrc: Oral  Oral Oral  SpO2: 91% 95% 92% 92%  Weight:   67 kg   Height:        Intake/Output Summary (Last 24 hours) at 07/23/2021 1257 Last data filed at 07/23/2021 1230 Gross per 24 hour  Intake 712 ml  Output 1700 ml  Net -988 ml    Filed Weights   07/21/21 1302 07/22/21 0639 07/23/21 0548  Weight: 68.6 kg 68.1 kg 67 kg    Examination:  General exam: Appears calm and comfortable  Respiratory system: Crackles bilaterally, respiratory effort normal. Cardiovascular system: S1 & S2 heard, RRR. No JVD, murmurs, rubs, gallops or clicks. No pedal edema. Gastrointestinal system: Abdomen is nondistended, soft and nontender. No organomegaly or masses felt. Normal bowel sounds heard. Central nervous system: Alert and oriented. No focal neurological deficits. Extremities: Symmetric 5 x 5 power. Skin: No rashes, lesions or ulcers.  Psychiatry: Judgement and insight appear normal. Mood & affect appropriate.   Data Reviewed: I have personally reviewed following labs and imaging studies  CBC: Recent Labs  Lab 07/16/21 2004 07/18/21 0727 07/19/21 0103 07/19/21 1513 07/20/21 0428 07/21/21 0259 07/21/21  1410 07/21/21 1546 07/22/21 0309 07/23/21 0355  WBC 8.6   < > 7.7  --  8.6 7.9  --   --  9.3 8.4  NEUTROABS 7.2  --   --   --   --   --   --   --   --   --   HGB 8.8*   < > 7.4*   < > 8.1* 6.8* 8.5* 9.4* 8.5* 8.7*  HCT 28.0*   < > 23.5*   < > 26.5* 22.3* 25.0* 29.4* 27.5* 28.1*  MCV 86.2   < > 85.8  --  87.2 88.5  --   --  86.5 87.5  PLT 339   < > 287  --  345 298  --   --  290 283   < > = values in this interval not displayed.    Basic Metabolic Panel: Recent Labs  Lab 07/18/21 0342 07/19/21 0103 07/19/21 1513 07/20/21 0428 07/21/21 0259 07/21/21 1410 07/22/21 0309 07/23/21 0355  NA 137 136   < > 138 138 143 138 138  K 3.7 3.3*   < > 3.7 3.4* 3.3* 3.4* 4.0  CL 111 109  --  108 108 108 108 106  CO2 14* 18*  --  17* 19*  --  21* 23  GLUCOSE  131* 141*  --  137* 113* 147* 133* 128*  BUN 89* 89*  --  83* 83* 87* 87* 76*  CREATININE 3.78* 3.43*  --  3.45* 3.40* 3.50* 3.19* 3.13*  CALCIUM 8.3* 8.2*  --  8.5* 8.2*  --  8.4* 8.3*  MG 1.9  --   --  2.1  --   --   --   --    < > = values in this interval not displayed.    GFR: Estimated Creatinine Clearance: 13.3 mL/min (A) (by C-G formula based on SCr of 3.13 mg/dL (H)). Liver Function Tests: Recent Labs  Lab 07/16/21 2004  AST 12*  ALT 9  ALKPHOS 54  BILITOT 0.6  PROT 6.4*  ALBUMIN 3.4*    No results for input(s): LIPASE, AMYLASE in the last 168 hours. No results for input(s): AMMONIA in the last 168 hours. Coagulation Profile: No results for input(s): INR, PROTIME in the last 168 hours. Cardiac Enzymes: No results for input(s): CKTOTAL, CKMB, CKMBINDEX, TROPONINI in the last 168 hours. BNP (last 3 results) Recent Labs    01/26/21 1313  PROBNP 823.0*    HbA1C: No results for input(s): HGBA1C in the last 72 hours. CBG: No results for input(s): GLUCAP in the last 168 hours. Lipid Profile: No results for input(s): CHOL, HDL, LDLCALC, TRIG, CHOLHDL, LDLDIRECT in the last 72 hours. Thyroid Function Tests: No results for input(s): TSH, T4TOTAL, FREET4, T3FREE, THYROIDAB in the last 72 hours. Anemia Panel: Recent Labs    07/20/21 1752  FERRITIN 60  TIBC 189*  IRON 27*    Sepsis Labs: No results for input(s): PROCALCITON, LATICACIDVEN in the last 168 hours.  Recent Results (from the past 240 hour(s))  Resp Panel by RT-PCR (Flu A&B, Covid) Nasopharyngeal Swab     Status: None   Collection Time: 07/17/21  3:05 AM   Specimen: Nasopharyngeal Swab; Nasopharyngeal(NP) swabs in vial transport medium  Result Value Ref Range Status   SARS Coronavirus 2 by RT PCR NEGATIVE NEGATIVE Final    Comment: (NOTE) SARS-CoV-2 target nucleic acids are NOT DETECTED.  The SARS-CoV-2 RNA is generally detectable in upper respiratory specimens during the  acute phase of  infection. The lowest concentration of SARS-CoV-2 viral copies this assay can detect is 138 copies/mL. A negative result does not preclude SARS-Cov-2 infection and should not be used as the sole basis for treatment or other patient management decisions. A negative result may occur with  improper specimen collection/handling, submission of specimen other than nasopharyngeal swab, presence of viral mutation(s) within the areas targeted by this assay, and inadequate number of viral copies(<138 copies/mL). A negative result must be combined with clinical observations, patient history, and epidemiological information. The expected result is Negative.  Fact Sheet for Patients:  EntrepreneurPulse.com.au  Fact Sheet for Healthcare Providers:  IncredibleEmployment.be  This test is no t yet approved or cleared by the [place] FDA and  has been authorized for detection and/or diagnosis of SARS-CoV-2 by FDA under an Emergency Use Authorization (EUA). This EUA will remain  in effect (meaning this test can be used) for the duration of the COVID-19 declaration under Section 564(b)(1) of the Act, 21 U.S.C.section 360bbb-3(b)(1), unless the authorization is terminated  or revoked sooner.       Influenza A by PCR NEGATIVE NEGATIVE Final   Influenza B by PCR NEGATIVE NEGATIVE Final    Comment: (NOTE) The Xpert Xpress SARS-CoV-2/FLU/RSV plus assay is intended as an aid in the diagnosis of influenza from Nasopharyngeal swab specimens and should not be used as a sole basis for treatment. Nasal washings and aspirates are unacceptable for Xpert Xpress SARS-CoV-2/FLU/RSV testing.  Fact Sheet for Patients: EntrepreneurPulse.com.au  Fact Sheet for Healthcare Providers: IncredibleEmployment.be  This test is not yet approved or cleared by the [place] FDA and has been authorized for detection and/or diagnosis of SARS-CoV-2  by FDA under an Emergency Use Authorization (EUA). This EUA will remain in effect (meaning this test can be used) for the duration of the COVID-19 declaration under Section 564(b)(1) of the Act, 21 U.S.C. section 360bbb-3(b)(1), unless the authorization is terminated or revoked.  Performed at [facility], [place] [address] [place], [place] [address]        Radiology Studies: ECHO TEE  Result Date: 07/21/2021    TRANSESOPHOGEAL ECHO REPORT   Patient Name:   [facility] Date of Exam: 07/21/2021 Medical Rec #:  [id number]      Height:       65.0 in Accession #:    [id number]     Weight:       151.2 lb Date of Birth:  [birth year]     BSA:          1.757 m Patient Age:    5 years       BP:           105/60 mmHg Patient Gender: F              HR:           70 bpm. Exam Location:  Inpatient Procedure: Transesophageal Echo, Color Doppler, 3D Echo and Cardiac Doppler Indications:    AS, MS  History:        Patient has prior history of Echocardiogram examinations.  Sonographer:    Dustin Flock RDCS Referring Phys: [phone] Wilmer Floor SIMMONS PROCEDURE: The transesophogeal probe was passed without difficulty through the esophogus of the patient. Sedation performed by different physician. The patient developed no complications during the procedure. IMPRESSIONS  1. Left ventricular ejection fraction, by estimation, is 60 to 65%. The left ventricle has normal function. The left ventricle has no regional wall  motion abnormalities. There is mild left ventricular hypertrophy.  2. Peak RV-RA gradient 40 mmHg. Right ventricular systolic function is mildly reduced. The right ventricular size is moderately enlarged.  3. Left atrial size was moderately dilated. No left atrial/left atrial appendage thrombus was detected.  4. Right atrial size was moderately dilated.  5. 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 Mild to moderate mitral valve regurgitation. Moderate mitral stenosis. The mean mitral valve  gradient is 6.0 mmHg with MVA 1.3 cm^2 by VTI. Moderate to severe mitral annular calcification.  6. The aortic valve is tricuspid. Aortic valve regurgitation is mild. Severe aortic valve stenosis. Aortic valve area, by VTI measures 0.87 cm. Aortic valve mean gradient measures 46.0 mmHg.  7. A small pericardial effusion is present. FINDINGS  Left Ventricle: Left ventricular ejection fraction, by estimation, is 60 to 65%. The left ventricle has normal function. The left ventricle has no regional wall motion abnormalities. The left ventricular internal cavity size was normal in size. There is  mild left ventricular hypertrophy. Right Ventricle: Peak RV-RA gradient 40 mmHg. The right ventricular size is moderately enlarged. No increase in right ventricular wall thickness. Right ventricular systolic function is mildly reduced. Left Atrium: Left atrial size was moderately dilated. No left atrial/left atrial appendage thrombus was detected. Right Atrium: Right atrial size was moderately dilated. Pericardium: A small pericardial effusion is present. Mitral Valve: The mitral valve is rheumatic. There is moderate calcification of the mitral valve leaflet(s). Moderate to severe mitral annular calcification. Mild to moderate mitral valve regurgitation. Moderate mitral valve stenosis. MV peak gradient, 16.2 mmHg. The mean mitral valve gradient is 6.0 mmHg. Tricuspid Valve: The tricuspid valve is normal in structure. Tricuspid valve regurgitation is mild. Aortic Valve: The aortic valve is tricuspid. Aortic valve regurgitation is mild. Severe aortic stenosis is present. Aortic valve mean gradient measures 46.0 mmHg. Aortic valve peak gradient measures 74.1 mmHg. Aortic valve area, by VTI measures 0.87 cm. Pulmonic Valve: The pulmonic valve was normal in structure. Pulmonic valve regurgitation is not visualized. Aorta: The aortic root is normal in size and structure. IAS/Shunts: No atrial level shunt detected by color flow  Doppler.  LEFT VENTRICLE PLAX 2D LVOT diam:     2.00 cm LV SV:         83 LV SV Index:   47 LVOT Area:     3.14 cm  AORTIC VALVE AV Area (Vmax):    0.89 cm AV Area (Vmean):   0.80 cm AV Area (VTI):     0.87 cm AV Vmax:           430.50 cm/s AV Vmean:          303.500 cm/s AV VTI:            0.953 m AV Peak Grad:      74.1 mmHg AV Mean Grad:      46.0 mmHg LVOT Vmax:         122.00 cm/s LVOT Vmean:        76.900 cm/s LVOT VTI:          0.264 m LVOT/AV VTI ratio: 0.28 MITRAL VALVE               TRICUSPID VALVE MV Area (PHT): 1.83 cm    TR Peak grad:   39.7 mmHg MV Area VTI:   1.30 cm    TR Vmax:        315.00 cm/s MV Peak grad:  16.2 mmHg MV Mean  grad:  6.0 mmHg    SHUNTS MV Vmax:       2.01 m/s    Systemic VTI:  0.26 m MV Vmean:      113.3 cm/s  Systemic Diam: 2.00 cm Dalton McleanMD Electronically signed by Franki Monte Signature Date/Time: 07/21/2021/2:35:24 PM    Final     Scheduled Meds:  atorvastatin  20 mg Oral Daily   levothyroxine  125 mcg Oral QAC breakfast   metoprolol succinate  25 mg Oral BID   octreotide  50 mcg Subcutaneous Q12H   pantoprazole  40 mg Oral BID   Riociguat  2.5 mg Oral TID   Selexipag  1,600 mcg Oral BID   Selexipag  800 mcg Oral BID   sodium bicarbonate  650 mg Oral TID   sodium chloride flush  3 mL Intravenous Q12H   sodium chloride flush  3 mL Intravenous Q12H   sodium chloride flush  3 mL Intravenous Q12H   torsemide  60 mg Oral BID   Continuous Infusions:  sodium chloride       LOS: 6 days   Time spent: 28 minutes   Darliss Cheney, MD [facility]  07/23/2021, 12:57 PM  Please page via Amion and do not message via secure chat for anything urgent. Secure chat can be used for anything non urgent.  How to contact the [facility] Attending or Consulting provider [place] or covering provider during after hours [place], for this patient?  Check the care team in [facility] and look for a) attending/consulting TRH provider listed and b) the [facility] team listed. Page  or secure chat [phone]. Log into [email or web address] and use [place]'s universal password to access. If you do not have the password, please contact the hospital operator. Locate the [facility] provider you are looking for under [facility] and page to a number that you can be directly reached. If you still have difficulty reaching the provider, please page the [facility] (Director on Call) for the Hospitalists listed on amion for assistance.

## 2021-07-23 NOTE — Progress Notes (Signed)
Pt has been ambulating, resting now. Discussed HF booklet with pt and daughter. Pt well versed on daily wts and signs of fluid. She was thankful for sodium information. Encouraged continued ambulation at d/c. [place] CES, ACSM 3:10 PM 07/23/2021

## 2021-07-23 NOTE — TOC Progression Note (Signed)
Transition of Care [facility]) - Progression Note    Patient Details  Name: Alisha [name] MRN: [id number] Date of Birth: 01/06/1941  [facility] of [facility] CM/SW [place], RN Phone Number: 07/23/2021, 4:48 PM  Clinical Narrative:    Spoke with daughter Alisha [name] who was in room with patient. Discussed any needs, ordered 3:1 for patient. She mentioned that the patient has services through [facility] and [facility]. Called [place] and they gave Alisha [name] as contact for any HH needs through [place], [phone] . PT and OT assessment reveals no needs. Called [employer] for 3:1 and oxygen needs. She will need 2L. Tank will be brought up for DC tomorrow.    Expected Discharge Plan: [place] Barriers to Discharge: Continued Medical Work up  Expected Discharge Plan and Services Expected Discharge Plan: [facility] In-house Referral: Clinical Social Work Discharge Planning Services: CM Consult Post Acute Care Choice: [place]                                         Social Determinants of Health (SDOH) Interventions    Readmission Risk Interventions No flowsheet data found.

## 2021-07-23 NOTE — Progress Notes (Addendum)
Advanced Heart Failure Rounding Note  PCP-Cardiologist: Dr. Aundra Dubin   Subjective:    Weight down another 3 lbs overnight with 80 mg lasix IV BID + 2.5 mg metolazone, Is/Os likely not accurate  Scr trending back down, peak 4.37 > 3.8 > 3.5 > 3.2 > 3.1. Prior baseline around 1.6. K 4.0  1 U PRBCs 11/09 for Hgb 6.8, Hgb now stable at 8.7  Feeling better today. Less dyspnea. O2 requirement down to 2L  Seen by Valve Team yesterday. D/t complexity of medical issues including kidney disease, GI bleeding and RHF risks of TAVR felt to likely outweigh benefit  TEE 11/09: LVEF 60-65%, RV mildly reduced, peak RV-RA gradient 40 mmHg, severe aortic valve stenosis with mean gradient 46 mmhg and AVA 0.87 cm2, moderate mitral stenosis with mean gradient 6 mmHg  RHC Procedural Findings: Hemodynamics (mmHg) RA mean 10 RV 86/14 PA 90/29, mean 53 PCWP mean 20  Oxygen saturations: PA 61% AO 95%  Cardiac Output (Fick) 6.93  Cardiac Index (Fick) 3.89 PVR 4.8 WU   Objective:   Weight Range: 67 kg Body mass index is 24.56 kg/m.   Vital Signs:   Temp:  [97.9 F (36.6 C)-98.2 F (36.8 C)] 97.9 F (36.6 C) (11/11 0548) Pulse Rate:  [70-75] 70 (11/11 0548) Resp:  [15-20] 15 (11/11 0548) BP: (104-145)/(59-92) 134/92 (11/11 0548) SpO2:  [91 %-95 %] 92 % (11/11 0548) Weight:  [67 kg] 67 kg (11/11 0548) Last BM Date: 07/21/21  Weight change: Filed Weights   07/21/21 1302 07/22/21 0639 07/23/21 0548  Weight: 68.6 kg 68.1 kg 67 kg    Intake/Output:   Intake/Output Summary (Last 24 hours) at 07/23/2021 8325 Last data filed at 07/23/2021 4982 Gross per 24 hour  Intake 712 ml  Output 1600 ml  Net -888 ml      Physical Exam    General:  thin, elderly female. In no distress. Sitting up in bed. HEENT: normal Neck: supple. JVP 9 cm. Carotids 2+ bilat; no bruits. No lymphadenopathy or thryomegaly appreciated. Cor: PMI nondisplaced. Regular rhythm. 3/6 SEM.  Lungs: decreased in  bases Abdomen: soft, nontender, nondistended. No hepatosplenomegaly. No bruits or masses. Good bowel sounds. Extremities: no cyanosis, clubbing, rash, edema Neuro: alert & orientedx3, cranial nerves grossly intact. moves all 4 extremities w/o difficulty. Affect pleasant    Telemetry   NSR 70s  Labs    CBC Recent Labs    07/22/21 0309 07/23/21 0355  WBC 9.3 8.4  HGB 8.5* 8.7*  HCT 27.5* 28.1*  MCV 86.5 87.5  PLT 290 641   Basic Metabolic Panel Recent Labs    07/22/21 0309 07/23/21 0355  NA 138 138  K 3.4* 4.0  CL 108 106  CO2 21* 23  GLUCOSE 133* 128*  BUN 87* 76*  CREATININE 3.19* 3.13*  CALCIUM 8.4* 8.3*   Liver Function Tests No results for input(s): AST, ALT, ALKPHOS, BILITOT, PROT, ALBUMIN in the last 72 hours. No results for input(s): LIPASE, AMYLASE in the last 72 hours. Cardiac Enzymes No results for input(s): CKTOTAL, CKMB, CKMBINDEX, TROPONINI in the last 72 hours.  BNP: BNP (last 3 results) Recent Labs    06/18/21 1225 07/17/21 0336 07/19/21 0103  BNP 986.2* 1,247.8* 1,758.6*    ProBNP (last 3 results) Recent Labs    01/26/21 1313  PROBNP 823.0*     D-Dimer No results for input(s): DDIMER in the last 72 hours. Hemoglobin A1C No results for input(s): HGBA1C in the last 72 hours.  Fasting Lipid Panel No results for input(s): CHOL, HDL, LDLCALC, TRIG, CHOLHDL, LDLDIRECT in the last 72 hours. Thyroid Function Tests No results for input(s): TSH, T4TOTAL, T3FREE, THYROIDAB in the last 72 hours.  Invalid input(s): FREET3  Other results:   Imaging    No results found.   Medications:     Scheduled Medications:  atorvastatin  20 mg Oral Daily   furosemide  80 mg Intravenous BID   levothyroxine  125 mcg Oral QAC breakfast   metoprolol succinate  25 mg Oral BID   octreotide  50 mcg Subcutaneous Q12H   pantoprazole  40 mg Oral BID   Riociguat  2.5 mg Oral TID   Selexipag  1,600 mcg Oral BID   Selexipag  800 mcg Oral BID    sodium bicarbonate  650 mg Oral TID   sodium chloride flush  3 mL Intravenous Q12H   sodium chloride flush  3 mL Intravenous Q12H   sodium chloride flush  3 mL Intravenous Q12H    Infusions:  sodium chloride      PRN Medications: sodium chloride, acetaminophen, ondansetron (ZOFRAN) IV, sodium chloride flush   Assessment/Plan   Assessment/Plan: 1. Pulmonary hypertension with RV failure:   - Severe PAH with PVR 7 WU on 1/19 RHC.  Etiology uncertain.  She has a history of DVT remotely but V/Q scan in 2/19 did not show evidence for chronic PE. She has OSA and uses CPAP.  Serologic workup showed only positive ANA but when additional serologies were done, they were all negative.  CT chest with no ILD or emphysema.  Valvular heart disease does not appear significant enough to cause this degree of PH.  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   - She did not tolerate Opsumit or ambrisentan due to significant lower extremity edema and worsening dyspnea.   - RHC in 4/20 showed severe pulmonary hypertension with normal filling pressures and preserved cardiac output.  Echo in 4/21 showed normal RV with PASP 50 mmHg, echo 4/22 showed normal RV with PASP estimated 41 mmHg. TEE in 4/22 showed D-shaped septum with mild RV enlargement/mildly decreased RV systolic function.  At baseline, has oxygen for exertion.   - Echo this admission with D-shaped septum, moderate RVE and moderate RV dysfunction, also concerning for worsening valvular HD. - RHC this admit showed mild-moderately elevated right and left filling pressures, preserved cardiac output and severe primarily pulmonary arterial hypertension. PVR 4.8 WU. 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  - Continue Adempas 2.5 mg tid.  - Continue Uptravi 2400 mcg BID.   2. Acute on chronic diastolic CHF:  - In setting of pulmonary hypertension and valvular disease.  Worsened symptoms with volume overload on exam.  Unfortunately, also appears to have developed  progressive cardiorenal syndrome.  - RHC showed mild-moderately elevated right and left filling pressures, preserved cardiac output, mRA 10, mPCWP 20, CI 3.89. 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 Weight down total of 8 lb with IV lasix + metolazone. Will switch back to home regimen of 60 mg Torsemide BID + once weekly metolazone. 3. Syncope: Suspect related to severe pulmonary hypertension.  None recently.   4. 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  With heavy calcification, I do not think that she would be a mitral valvuloplasty candidate.  AS also has been moderate in the past.   -  Echo this admission reviewed, I am worried for progressive valvular heart disease leading to worsening volume status as well as worsening cardiorenal syndrome.  Echo this admission with moderate-severe aortic stenosis (mean gradient 34 mmHg with AVA around 1.0 cm^2) and moderate-severe mitral stenosis (mean gradient 10 mmHg).  - TEE 11/09: LVEF 60-65%, severe aortic valve stenosis with mean gradient of 46 mmHg and AVA 0.87 cm2, moderate mitral stenosis with mean gradient 6 mmHg - Seen by Valve Team. Not a candidate for TAVR 5. H/o DVT: No chronic PE on prior V/Q scan.   6. OSA: Continue CPAP qHS.  7. AKI on CKD Stage 3: Prior baseline creatinine has been around 1.6, recently with significant increase.  Admitted with creatinine at 4.4, now down to 3.1.  Concern for progressive cardiorenal syndrome possibly with worsening valvular HD.  8. H/o GI bleeding: Recurrent.  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 In setting of severe AS. - Hgb down to 6.8 on 11/09, received 1 U pRBCs. Hgb 8.7 this am. - Iron deficient. Had been receiving IV iron through [facility]  - Seen by GI. She has opted for no further endoscopic evaluation. - Started on Octreotide,  discontinue if transitions to hospice - Continue PPI 9. Hypokalemia: - Resolved  [facility] consulted.  Code status updated - Now DNR  PT/OT saw - no therapy needed after discharge  Okay for discharge from HF perspective but will need to sort out how she will get Uptravi and Adempas if goes to [facility]. Will discuss with HF CSW.  HF medications at D/C: -Torsemide 60 mg BID -Metolazone once weekly on Saturday (do not take until 11/19) -KCL 20 mEq daily + extra 20 mEq with metolazone -Adempas 2.5 mg TID -Uptravi 2400 mg BID -Metoprolol xl 25 mg BID - Atorvastatin  Will arrange f/u in HF clinic   Length of Stay: [place], [place], PA-C  07/23/2021, 9:22 AM  Advanced Heart Failure Team Pager [phone] (M-F; 7a - 5p)  Please contact [facility] for night-coverage after hours (5p -7a ) and weekends on [email or web address]  Patient seen with PA, agree with the above note.    TEE showed severe aortic stenosis, moderate mitral stenosis .    She is breathing better overall.  Had 1 unit PRBCs on 11/9 with hgb up to 8.7.  I/Os do not appear accurate again, but weight lower and she says she has urinated a lot.  Creatinine mildly lower at 3.13.    General: NAD Neck: JVP 8-9 cm, no thyromegaly or thyroid nodule.  Lungs: Clear to auscultation bilaterally with normal respiratory effort. CV: Nondisplaced PMI.  Heart regular S1/S2, no S3/S4, 3/6 SEM RUSB with obscured S2.  No peripheral edema.   Abdomen: Soft, nontender, no hepatosplenomegaly, no distention.  Skin: Intact without lesions or rashes.  Neurologic: Alert and oriented x 3.  Psych: Normal affect. Extremities: No clubbing or cyanosis.  HEENT: Normal.   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  She is not a candidate for  surgical aortic/mitral valve replacement.  With heavily calcified mitral valve (possibly rheumatic), she is unlikely to be a valvuloplasty candidate.  Consideration of TAVR is complicated by the severe PH, which is concerning for anesthesia; chronic anemia which is going to be an issue with post-procedure antiplatelet therapy; and progressive renal dysfunction (likely  cardiorenal) which is going to limit our ability to give her contrast.  She would be a poor HD candidate with severe pulmonary hypertension.  On the flip side, she has been very functional and living alone up to this admission.  Dr. Burt Knack has seen and we have discussed, do not think she is going to be a good candidate for TAVR for the above reasons.  We will continue to do the best we can with medical management.  She has been seen by palliative care service and is DNR.     Transition today to torsemide 60 mg bid with metolazone 2.5 once weekly for home.    GI has seen. She had small bowel enteroscopy last month that was unrevealing. No plan to repeat endoscopy. Hgb stable today.   From my standpoint, she can leave the hospital with close followup.  She will need to continue her pulmonary hypertension meds.  She should be discharged on the cardiac meds listed above in the PA note.  Close followup CHF clinic.   Loralie Champagne 07/23/2021 10:07 AM

## 2021-07-23 NOTE — Care Management Important Message (Signed)
Important Message  Patient Details  Name: Alisha [name] MRN: [id number] Date of Birth: [birth year]   Medicare Important Message Given:  Yes     Shelda Altes 07/23/2021, 10:54 AM

## 2021-07-23 NOTE — Progress Notes (Signed)
[facility] ROUNDING NOTE   Subjective: No acute events overnight.  No overt GI blood loss.  Has been tolerating octreotide bid without issue.   Objective: Vital signs in last 24 hours: Temp:  [97.9 F (36.6 C)-98.3 F (36.8 C)] 98.3 F (36.8 C) (11/11 1227) Pulse Rate:  [63-70] 63 (11/11 1227) Resp:  [15-17] 17 (11/11 1227) BP: (116-145)/(59-92) 116/63 (11/11 1227) SpO2:  [92 %-95 %] 92 % (11/11 1227) Weight:  [67 kg] 67 kg (11/11 0548) Last BM Date: 07/21/21 General: NAD Abdomen:  Soft, NT, ND Ext:  No c/c/e    Intake/Output from previous day: 11/10 0701 - 11/11 0700 In: 358 [P.O.:358] Out: 1600 [Urine:1600] Intake/Output this shift: Total I/O In: 472 [P.O.:472] Out: 500 [Urine:500]   Lab Results: Recent Labs    07/21/21 0259 07/21/21 1410 07/21/21 1546 07/22/21 0309 07/23/21 0355  WBC 7.9  --   --  9.3 8.4  HGB 6.8*   < > 9.4* 8.5* 8.7*  PLT 298  --   --  290 283  MCV 88.5  --   --  86.5 87.5   < > = values in this interval not displayed.   BMET Recent Labs    07/21/21 0259 07/21/21 1410 07/22/21 0309 07/23/21 0355  NA 138 143 138 138  K 3.4* 3.3* 3.4* 4.0  CL 108 108 108 106  CO2 19*  --  21* 23  GLUCOSE 113* 147* 133* 128*  BUN 83* 87* 87* 76*  CREATININE 3.40* 3.50* 3.19* 3.13*  CALCIUM 8.2*  --  8.4* 8.3*   LFT No results for input(s): PROT, ALBUMIN, AST, ALT, ALKPHOS, BILITOT, BILIDIR, IBILI in the last 72 hours. PT/INR No results for input(s): INR in the last 72 hours.    Imaging/Other results: No results found.    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  Patient potentially planning discharge to live with her daughter for the time being.  Discussed etiology of recurrent/chronic GI bleed along with diagnostic/treatment options.   Doing okay with octreotide bid as inpatient.  Discussed starting long-acting Depo shot here as an inpatient. - Per discussion with patient earlier on this admission, no further endoscopic evaluation at this time.  She has had an extensive evaluation as an outpatient, to include recent push enteroscopy which was otherwise unrevealing.  Suspect she continues to have intermittent small bowel AVM bleeds, potentially out of reach of enteroscopy.  Will trial continued medical management as below - Octreotide LAR 20 mg tomorrow morning - Patient will discuss with her primary outpatient GI about paperwork authorization of continued octreotide use as an outpatient.  This could be done either with long-acting monthly Depo shot vs bid dosing depending on insurance approval - She already has follow-up arranged through her outpatient GI at [facility] next week.  Can have CBC drawn there as well - Patient can continue regular CBC checks with outpatient Hematology with additional IV iron and/or RBCs as needed - GI service will sign off at this time.  Please do not hesitate to contact inpatient GI with additional questions or concerns    Lavena Bullion, DO  07/23/2021, 2:53 PM [facility] Pager [phone]

## 2021-07-24 DIAGNOSIS — N184 Chronic kidney disease, stage 4 (severe): Secondary | ICD-10-CM | POA: Diagnosis not present

## 2021-07-24 DIAGNOSIS — N179 Acute kidney failure, unspecified: Secondary | ICD-10-CM | POA: Diagnosis not present

## 2021-07-24 DIAGNOSIS — I5043 Acute on chronic combined systolic (congestive) and diastolic (congestive) heart failure: Secondary | ICD-10-CM | POA: Diagnosis not present

## 2021-07-24 LAB — BASIC METABOLIC PANEL
Anion gap: 10 (ref 5–15)
BUN: 72 mg/dL — ABNORMAL HIGH (ref 8–23)
CO2: 24 mmol/L (ref 22–32)
Calcium: 8.1 mg/dL — ABNORMAL LOW (ref 8.9–10.3)
Chloride: 100 mmol/L (ref 98–111)
Creatinine, Ser: 3.07 mg/dL — ABNORMAL HIGH (ref 0.44–1.00)
GFR, Estimated: 15 mL/min — ABNORMAL LOW (ref >60)
Glucose, Bld: 147 mg/dL — ABNORMAL HIGH (ref 70–99)
Potassium: 3.2 mmol/L — ABNORMAL LOW (ref 3.5–5.1)
Sodium: 134 mmol/L — ABNORMAL LOW (ref 135–145)

## 2021-07-24 LAB — CBC
HCT: 27.2 % — ABNORMAL LOW (ref 36.0–46.0)
Hemoglobin: 8.5 g/dL — ABNORMAL LOW (ref 12.0–15.0)
MCH: 27.4 pg (ref 26.0–34.0)
MCHC: 31.3 g/dL (ref 30.0–36.0)
MCV: 87.7 fL (ref 80.0–100.0)
Platelets: 268 10*3/uL (ref 150–400)
RBC: 3.1 MIL/uL — ABNORMAL LOW (ref 3.87–5.11)
RDW: 18.7 % — ABNORMAL HIGH (ref 11.5–15.5)
WBC: 9 10*3/uL (ref 4.0–10.5)
nRBC: 0 % (ref 0.0–0.2)

## 2021-07-24 MED ORDER — POTASSIUM CHLORIDE ER 10 MEQ PO TBCR: 20.0000 meq | EXTENDED_RELEASE_TABLET | Freq: Every day | ORAL | 0 refills | Status: DC

## 2021-07-24 MED ORDER — TORSEMIDE 60 MG PO TABS: 60.0000 mg | ORAL_TABLET | Freq: Two times a day (BID) | ORAL | 0 refills | Status: DC

## 2021-07-24 MED ORDER — METOLAZONE 2.5 MG PO TABS: 2.5000 mg | ORAL_TABLET | ORAL | 0 refills | Status: AC

## 2021-07-24 MED ORDER — POTASSIUM CHLORIDE CRYS ER 20 MEQ PO TBCR
40.0000 meq | EXTENDED_RELEASE_TABLET | Freq: Once | ORAL | Status: AC
  Administered 2021-07-24: 40 meq via ORAL
  Filled 2021-07-24: qty 2

## 2021-07-24 NOTE — Progress Notes (Signed)
Patient ID: Alisha [name], female   DOB: 23-Jan-1941, 80 y.o.   MRN: [id number]     Advanced Heart Failure Rounding Note  PCP-Cardiologist: Dr. Aundra Dubin   Subjective:    Weight down another 1 lb.  Walking in hall this morning.  Breathing better.   Scr trending back down, peak 4.37 > 3.8 > 3.5 > 3.2 > 3.1 > 3.07. Prior baseline around 1.6. K low 3.2.  1 U PRBCs 11/09 for Hgb 6.8, Hgb now stable at 8.5.   Feeling better today. Less dyspnea. O2 requirement down to 2L  Seen by Structural Heart Team. D/t complexity of medical issues including kidney disease, GI bleeding and RHF risks of TAVR felt to likely outweigh benefit  TEE 11/09: LVEF 60-65%, RV mildly reduced, peak RV-RA gradient 40 mmHg, severe aortic valve stenosis with mean gradient 46 mmhg and AVA 0.87 cm2, moderate mitral stenosis with mean gradient 6 mmHg  RHC Procedural Findings: Hemodynamics (mmHg) RA mean 10 RV 86/14 PA 90/29, mean 53 PCWP mean 20  Oxygen saturations: PA 61% AO 95%  Cardiac Output (Fick) 6.93  Cardiac Index (Fick) 3.89 PVR 4.8 WU   Objective:   Weight Range: 66.2 kg Body mass index is 24.3 kg/m.   Vital Signs:   Temp:  [98.3 F (36.8 C)-98.8 F (37.1 C)] 98.8 F (37.1 C) (11/12 0357) Pulse Rate:  [41-67] 67 (11/12 0357) Resp:  [17-20] 20 (11/12 0357) BP: (108-117)/(56-63) 108/61 (11/12 0357) SpO2:  [92 %-99 %] 99 % (11/12 0357) Weight:  [66.2 kg] 66.2 kg (11/12 0357) Last BM Date: 07/21/21  Weight change: Filed Weights   07/22/21 0639 07/23/21 0548 07/24/21 0357  Weight: 68.1 kg 67 kg 66.2 kg    Intake/Output:   Intake/Output Summary (Last 24 hours) at 07/24/2021 0909 Last data filed at 07/23/2021 2357 Gross per 24 hour  Intake 708 ml  Output 500 ml  Net 208 ml      Physical Exam    General: NAD Neck: JVP 8 cm, no thyromegaly or thyroid nodule.  Lungs: Clear to auscultation bilaterally with normal respiratory effort. CV: Nondisplaced PMI.  Heart regular S1/S2, no  S3/S4, 3/6 SEM RUSB with obscured S2.  No peripheral edema.   Abdomen: Soft, nontender, no hepatosplenomegaly, no distention.  Skin: Intact without lesions or rashes.  Neurologic: Alert and oriented x 3.  Psych: Normal affect. Extremities: No clubbing or cyanosis.  HEENT: Normal.     Telemetry   NSR 70s  Labs    CBC Recent Labs    07/23/21 0355 07/24/21 0506  WBC 8.4 9.0  HGB 8.7* 8.5*  HCT 28.1* 27.2*  MCV 87.5 87.7  PLT 283 563   Basic Metabolic Panel Recent Labs    07/23/21 0355 07/24/21 0506  NA 138 134*  K 4.0 3.2*  CL 106 100  CO2 23 24  GLUCOSE 128* 147*  BUN 76* 72*  CREATININE 3.13* 3.07*  CALCIUM 8.3* 8.1*   Liver Function Tests No results for input(s): AST, ALT, ALKPHOS, BILITOT, PROT, ALBUMIN in the last 72 hours. No results for input(s): LIPASE, AMYLASE in the last 72 hours. Cardiac Enzymes No results for input(s): CKTOTAL, CKMB, CKMBINDEX, TROPONINI in the last 72 hours.  BNP: BNP (last 3 results) Recent Labs    06/18/21 1225 07/17/21 0336 07/19/21 0103  BNP 986.2* 1,247.8* 1,758.6*    ProBNP (last 3 results) Recent Labs    01/26/21 1313  PROBNP 823.0*     D-Dimer No results for input(s): DDIMER  in the last 72 hours. Hemoglobin A1C No results for input(s): HGBA1C in the last 72 hours. Fasting Lipid Panel No results for input(s): CHOL, HDL, LDLCALC, TRIG, CHOLHDL, LDLDIRECT in the last 72 hours. Thyroid Function Tests No results for input(s): TSH, T4TOTAL, T3FREE, THYROIDAB in the last 72 hours.  Invalid input(s): FREET3  Other results:   Imaging    No results found.   Medications:     Scheduled Medications:  atorvastatin  20 mg Oral Daily   levothyroxine  125 mcg Oral QAC breakfast   metoprolol succinate  25 mg Oral BID   octreotide  20 mg Intramuscular Once   pantoprazole  40 mg Oral BID   potassium chloride  40 mEq Oral Once   Riociguat  2.5 mg Oral TID   Selexipag  1,600 mcg Oral BID   Selexipag  800 mcg  Oral BID   sodium bicarbonate  650 mg Oral TID   sodium chloride flush  3 mL Intravenous Q12H   sodium chloride flush  3 mL Intravenous Q12H   sodium chloride flush  3 mL Intravenous Q12H   torsemide  60 mg Oral BID    Infusions:  sodium chloride      PRN Medications: sodium chloride, acetaminophen, ondansetron (ZOFRAN) IV, sodium chloride flush   Assessment/Plan   1. Pulmonary hypertension with RV failure:  Severe PAH with PVR 7 WU on 1/19 RHC.  Etiology uncertain.  She has a history of DVT remotely but V/Q scan in 2/19 did not show evidence for chronic PE. She has OSA and uses CPAP.  Serologic workup showed only positive ANA but when additional serologies were done, they were all negative.  CT chest with no ILD or emphysema.  Valvular heart disease does not appear significant enough to cause this degree of PH.  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  She did not tolerate Opsumit or ambrisentan due to significant lower extremity edema and worsening dyspnea.  [place] in 4/20 showed severe pulmonary hypertension with normal filling pressures and preserved cardiac output.  Echo in 4/21 showed normal RV with PASP 50 mmHg, echo 4/22 showed normal RV with PASP estimated 41 mmHg. TEE in 4/22 showed D-shaped septum with mild RV enlargement/mildly decreased RV systolic function.  At baseline, has oxygen for exertion.  Echo this admission with D-shaped septum, moderate RVE and moderate RV dysfunction, also concerning for worsening valvular HD.  RHC this admit showed mild-moderately elevated right and left filling pressures, preserved cardiac output and severe primarily pulmonary arterial hypertension. PVR 4.8 WU. 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  - Continue Adempas 2.5 mg tid.  - Continue Uptravi 2400 mcg BID.   2. Acute on chronic diastolic CHF: In setting of pulmonary hypertension and valvular disease.  Worsened symptoms with volume overload on exam.  Unfortunately, also appears to have developed  progressive cardiorenal syndrome.  RHC showed mild-moderately elevated right and left filling pressures, preserved cardiac output, mRA 10, mPCWP 20, CI 3.89. 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 Volume improved. Weight down and now back on home regimen of 60 mg Torsemide BID + once weekly metolazone. - Continue torsemide 60 mg bid + metolazone qwk.   - She will need KCl 20 daily at home.  3. Syncope: Suspect related to severe pulmonary hypertension.  None recently.   4. 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  With heavy  calcification, I do not think that she would be a mitral valvuloplasty candidate.  I am worried for progressive valvular heart disease leading to worsening volume status as well as worsening cardiorenal syndrome.  Echo this admission with moderate-severe aortic stenosis (mean gradient 34 mmHg with AVA around 1.0 cm^2) and moderate-severe mitral stenosis (mean gradient 10 mmHg).  TEE 11/09 with LVEF 60-65%, severe aortic valve stenosis with mean gradient of 46 mmHg and AVA 0.87 cm2, moderate mitral stenosis with mean gradient 6 mmHg - Seen by Valve Team. Not a candidate for TAVR 5. H/o DVT: No chronic PE on prior V/Q scan.  Not anticoagulated with persistent GI bleeding.  6. OSA: Continue CPAP qHS.  7. AKI on CKD Stage 3: Prior baseline creatinine has been around 1.6, recently with significant increase.  Admitted with creatinine at 4.4, now down to 3.07.  Concern for progressive cardiorenal syndrome possibly with worsening valvular HD.  8. H/o GI bleeding: Recurrent.  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 In setting of severe AS, ?Heyde syndrome.  Hgb down to 6.8 on 11/09, received 1 U pRBCs. Hgb 8.5 this am. Iron deficient, had been receiving IV iron through [facility]  - Seen by GI. She has opted for no further endoscopic evaluation. -  Started on Octreotide => to get depot dose, hopefully can continue this as outpatient.  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  She is not a candidate for surgical aortic/mitral valve replacement.  With heavily calcified mitral valve (possibly rheumatic), she is unlikely to be a valvuloplasty candidate.  Consideration of TAVR is complicated by the severe PH, which is concerning for anesthesia; chronic anemia which is going to be an issue with post-procedure antiplatelet therapy; and progressive renal dysfunction (likely cardiorenal) which is going to limit our ability to give her contrast.  She would be a poor HD candidate with severe pulmonary hypertension.  On the flip side, she has been very functional and living alone up to this admission.  Dr. Burt Knack has seen and we have discussed, do not think she is going to be a good candidate for TAVR for the above reasons.  We will continue to do the best we can with medical management.  She has been seen by palliative care service and is DNR.    Okay for discharge from HF perspective.  Plan to go back to her apartment for now.  HF medications at D/C: -Torsemide 60 mg BID -Metolazone once weekly on Saturday (do not take until 11/19) -KCL 20 mEq daily + extra 20 mEq with metolazone -Adempas 2.5 mg TID -Uptravi 2400 mg BID -Metoprolol xl 25 mg BID -Atorvastatin  Will arrange f/u in HF clinic   Length of Stay: [place], MD  07/24/2021, 9:09 AM  Advanced Heart Failure Team Pager [phone] (M-F; 7a - 5p)  Please contact [facility] for night-coverage after hours (5p -7a ) and weekends on [email or web address]

## 2021-07-24 NOTE — Progress Notes (Addendum)
CARDIAC REHAB PHASE I   PRE:  Rate/Rhythm: 93 SR PVCs  BP:  Sitting: 109/54      SaO2: 93 2L  MODE:  Ambulation: 470 ft   POST:  Rate/Rhythm: 95 SR  BP:  Sitting: 112/53      SaO2: 93 2L  Pt tolerated exercise well and amb 470 ft with independently. I pulled O2 beside pt. Pt denies CP, SOB, or dizziness throughout walk. Pt to bed with call bell in reach after walk. Pt is in good spirits and is ready go home. Reinforced HF booklet, daily weighing, and limiting sodium intake. Pt aware of daily weighing and fluid restriction. Will continue to follow.  [place], MS, ACSM-CEP 07/24/2021 9:16 AM

## 2021-07-24 NOTE — Discharge Summary (Signed)
Physician Discharge Summary  Alisha [name] [facility]GH:[id number] DOB: [birth year] DOA: 07/16/2021  PCP: Mackie Pai, PA-C  Admit date: 07/16/2021 Discharge date: 07/24/2021 30 Day Unplanned Readmission Risk Score    Flowsheet Row ED to Hosp-Admission (Current) from 07/16/2021 in [facility]  30 Day Unplanned Readmission Risk Score (%) 20.73 Filed at 07/24/2021 0800       This score is the patient's risk of an unplanned readmission within 30 days of being discharged (0 -100%). The score is based on dignosis, age, lab data, medications, orders, and past utilization.   Low:  0-14.9   Medium: 15-21.9   High: 22-29.9   Extreme: 30 and above          Admitted From: Home Disposition: Home  Recommendations for Outpatient Follow-up:  Follow up with PCP in 1-2 weeks Please obtain BMP/CBC in one week Follow-up with your GI as soon as possible Follow-up with cardiologist in 1 to 2 weeks Please follow up with your PCP on the following pending results: Unresulted Labs (From admission, onward)    None         Home Health: None Equipment/Devices: Bedside commode/home oxygen  Discharge Condition: Stable CODE STATUS: DNR Diet recommendation: Low-sodium  Subjective: Seen and examined.  She states that she is feeling a lot better.  She is on 2 L of oxygen.  No shortness of breath.  She was walking in the halls today.  Brief/Interim Summary: Alisha [name] is an 80 y.o. 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  She relates that she had her diuretic recently increased as she was becoming short of breath and requiring oxygen.  Upon arrival to the ED she was satting in the low 90s blood pressure 117/58 bicarb of 13 creatinine of 4 with a BUN of 97 (with a baseline creatinine of  1.3-1.5).  She was admitted initially with acute renal failure superimposed on CKD stage IV.  Further hospitalization course as below.  Acute renal failure superimposed on stage 4 chronic kidney disease/anion gap metabolic acidosis: Baseline creatinine seems to be around 1.8-1.9.  Presented with 4.3. Likely prerenal history  in the setting of torsemide and metolazone use. This was held she was given gentle IV fluid hydration.  Creatinine improved to 3.4, unfortunately she developed pulmonary edema seen on chest x-ray and BNP of 1700. Cardiology was consulted recommended right heart cath with results as below.  She received bicarb tablets.  Acidosis resolved.  Creatinine improved up to 3.4 but has remained stable at that level for last several days making it new baseline for her.  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   Hypokalemia: 3.2 again today.  Will replace before discharge.   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  Echo this admission with moderate-severe aortic stenosis (mean gradient 34 mmHg with AVA around 1.0 cm^2) and moderate-severe mitral stenosis (mean gradient 10 mmHg). Unfortunately right heart cath showed elevated right and left filling pressures with preserved cardiac output.  She was on IV Lasix 80 mg twice daily.  TEE completed 07/21/2021 confirmed moderate mitral stenosis but severe aortic stenosis.  Structural heart team evaluated her and she was deemed not  a candidate for TAVR due to being high risk candidate due to her severe pulmonary hypertension and significantly elevated creatinine.  Cardiology has switched her to oral diuretics now.  They recommend following medications Torsemide 60 mg BID -Metolazone once weekly on Saturday (do not take until 11/19) -KCL 20 mEq daily +  extra 20 mEq with metolazone -Adempas 2.5 mg TID -Uptravi 2400 mg BID -Metoprolol xl 25 mg BID - Atorvastatin 20 mg p.o. daily.   Normocytic chronic blood loss anemia in the setting of chronic GI blood loss: She has a history of AVMs.  She received 1 packed red blood cells on 07/15/2021. Her hemoglobin drifted down to 6.8 07/21/2021, received another unit of PRBC transfusion.  Seen by GI.  Reportedly she had push enteroscopy about a month ago which was negative.  After detailed and extensive discussion with the patient and family and GI, they mutually decided not to do any other procedures and that patient will follow up with her GI.  She was started on subcutaneous octreotide twice daily here.  She will receive long-acting octreotide subcutaneously here which will last for 30 days and she will follow-up with her primary GI and they will initiate prior authorization for subcutaneous octreotide.  Her hemoglobin has remained stable since last 2 days.  No further melena history.    Essential hypertension: Blood pressure labile.  Home dose of amlodipine was held and is being discontinued at discharge, but continue Toprol-XL.   Prolonged QTC: Stable.   Goal of care/placement: She was seen by palliative care.  At 1 point in time, she was leaning towards hospice if no definitive treatment for her aortic stenosis such as TAVR but later on, she changed her mind.  She was living in assisted living facility.  They are not offering her bed anymore due to her medications being expensive.  She is now being discharged at her daughter's home and eventually will transition to her own apartment.  Discharge Diagnoses:  Principal Problem:   Acute renal failure superimposed on stage 4 chronic kidney disease (HCC) Active Problems:   Pulmonary hypertension, unspecified (HCC)   CAD (coronary artery disease), native coronary artery   Chronic diastolic heart failure (HCC)   AVM (arteriovenous malformation) of small  bowel, acquired   Prolonged QT interval    Discharge Instructions   Allergies as of 07/24/2021       Reactions   Other Palpitations   Surgical metal (stents, pins, plates) keeps pt from healing   Adhesive [tape] Other (See Comments)   Irritates skin with rash   Macitentan Other (See Comments)   Lower extremity edema    Nickel Rash        Medication List     STOP taking these medications    allopurinol 100 MG tablet Commonly known as: ZYLOPRIM   amLODipine 10 MG tablet Commonly known as: NORVASC   metFORMIN 500 MG tablet Commonly known as: GLUCOPHAGE   traMADol 50 MG tablet Commonly known as: ULTRAM       TAKE these medications    Accu-Chek Aviva Plus test strip Generic drug: glucose blood Check blood sugar TID dx: E11.9   Adempas 2.5 MG Tabs Generic drug: Riociguat TAKE 1 TABLET THREE TIMES A DAY What changed: how much to take   atorvastatin 20 MG tablet Commonly known as: LIPITOR TAKE 1 TABLET BY MOUTH IN  THE MORNING What changed: when to take this   cetirizine 10 MG tablet Commonly known as: ZYRTEC Take 10 mg  by mouth daily as needed for allergies.   cholecalciferol 25 MCG (1000 UNIT) tablet Commonly known as: VITAMIN D Take 1,000 Units by mouth daily.   fenofibrate 160 MG tablet Take 1 tablet (160 mg total) by mouth every morning.   ferrous sulfate 325 (65 FE) MG tablet Take 1 tablet (325 mg total) by mouth daily with breakfast. Morning and night   levothyroxine 125 MCG tablet Commonly known as: SYNTHROID Take 1 tablet (125 mcg total) by mouth daily before breakfast.   loperamide 2 MG tablet Commonly known as: IMODIUM A-D Take 2 mg by mouth daily as needed for diarrhea or loose stools.   loratadine 10 MG tablet Commonly known as: CLARITIN Take 10 mg by mouth daily as needed for allergies.   metolazone 2.5 MG tablet Commonly known as: ZAROXOLYN Take 1 tablet (2.5 mg total) by mouth once a week. Take potassium 9meq with  this Start taking on: July 31, 2021 What changed:  when to take this additional instructions   metoprolol succinate 25 MG 24 hr tablet Commonly known as: Toprol XL Take 1 tablet (25 mg total) by mouth 2 (two) times daily.   pantoprazole 40 MG tablet Commonly known as: PROTONIX TAKE 1 TABLET BY MOUTH  TWICE DAILY   potassium chloride 10 MEQ tablet Commonly known as: KLOR-CON Take 2 tablets (20 mEq total) by mouth daily. Take an extra 70meq with metolazone What changed: how much to take   PRESERVISION AREDS 2 PO Take 1 capsule by mouth 2 (two) times daily.   Torsemide 60 MG Tabs Take 60 mg by mouth 2 (two) times daily. What changed:  medication strength how much to take   Uptravi 1600 MCG Tabs Generic drug: Selexipag Take 1 tablet (1,600 mcg total) by mouth 2 (two) times daily. Take with 800 for a total of 2400 mg   Uptravi 800 MCG Tabs Generic drug: Selexipag Take 1 tablet (800 mcg total) by mouth 2 (two) times daily. Take with 1600 mcg for a total of 2400 mg               Durable Medical Equipment  (From admission, onward)           Start     Ordered   07/23/21 1340  For home use only DME 3 n 1  Once        07/23/21 1339            Follow-up Information     [facility] Follow up on 07/28/2021.   Specialty: Cardiology Why: [facility] 1:30 pm Entrance C, Garage Code 3151 Contact information: [address] [id number] [place]        Saguier, Percell Miller, PA-C Follow up in 1 week(s).   Specialties: Internal Medicine, Family Medicine Contact information: [address] [place] [address] [phone]         Larey Dresser, MD .   Specialty: Cardiology Contact information: [address] [place] [address] [phone]                Allergies  Allergen Reactions   Other  Palpitations    Surgical metal (stents, pins, plates) keeps pt from healing   Adhesive [Tape] Other (See Comments)    Irritates skin with rash   Macitentan Other (See Comments)    Lower extremity edema    Nickel Rash  Consultations: Cardiology   Procedures/Studies: DG Chest 2 View  Result Date: 07/16/2021 CLINICAL DATA:  Shortness of breath and decreasing urinary function EXAM: CHEST - 2 VIEW COMPARISON:  01/26/2021, CT from 02/15/2021 FINDINGS: Cardiac shadow is mildly prominent. Aortic calcifications are noted. The lungs are well aerated bilaterally. Minimal pleural effusions are seen bilaterally. No acute bony abnormality is seen. Known bilateral pulmonary nodules are not well appreciated on this exam. IMPRESSION: Small pleural effusions bilaterally. Known pulmonary nodules are not well appreciated. Electronically Signed   By: Inez Catalina M.D.   On: 07/16/2021 20:38   CARDIAC CATHETERIZATION  Result Date: 07/19/2021 1. Mild to moderately elevated right and left heart filling pressures. 2. Preserved cardiac output. 3. Severe primarily pulmonary arterial hypertension. 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   DG CHEST PORT 1 VIEW  Result Date: 07/18/2021 CLINICAL DATA:  Dyspnea EXAM: PORTABLE CHEST 1 VIEW COMPARISON:  July 16, 2021 FINDINGS: Enlarged cardiac silhouette. Tortuosity and calcific atherosclerotic disease of the aorta. Streaky bilateral airspace opacities with lower lobe predominance. Possible bilateral pleural effusions, small. Osseous structures are without acute abnormality. Soft tissues are grossly normal. IMPRESSION: 1. Streaky bilateral airspace opacities and increased interstitial markings may represent pulmonary edema, with lower lobe atelectasis and small pleural effusions. 2. Enlarged cardiac silhouette. Electronically Signed   By: Fidela Salisbury M.D.   On: 07/18/2021 12:12   ECHOCARDIOGRAM COMPLETE  Result Date: 07/18/2021     ECHOCARDIOGRAM REPORT   Patient Name:   Alisha [name] [facility] Date of Exam: 07/18/2021 Medical Rec #:  [id number]      Height:       65.0 in Accession #:    [id number]     Weight:       155.5 lb Date of Birth:  January 07, 1941     BSA:          1.777 m Patient Age:    49 years       BP:           135/69 mmHg Patient Gender: F              HR:           95 bpm. Exam Location:  Inpatient Procedure: 2D Echo, Color Doppler and Cardiac Doppler Indications:    Dyspnea  History:        Patient has prior history of Echocardiogram examinations, most                 recent 01/07/2021. CHF, CAD, Pulmonary HTN; Risk                 Factors:Diabetes.  Sonographer:    Bernadene Person RDCS Referring Phys: [place]  1. Left ventricular ejection fraction, by estimation, is >75%. The left ventricle has hyperdynamic function. The left ventricle has no regional wall motion abnormalities. There is moderate left ventricular hypertrophy. Left ventricular diastolic parameters are consistent with Grade II diastolic dysfunction (pseudonormalization). Elevated left atrial pressure.  2. The ventricular septum is flattened in systole and diastole consistent with RV pressure and volume overload. Relatively preserved RV anular vertical motion however decreased free wall contraction in the horizontal plane, TAPSE and S' likely poor indicators for RV function. . Right ventricular systolic function is moderately reduced. The right ventricular size is moderately enlarged. Tricuspid regurgitation signal is inadequate for assessing PA pressure.  3. Left atrial size was moderately dilated.  4. A small pericardial effusion is present. The pericardial  effusion is circumferential.  5. The mitral valve is abnormal. Mild to moderate mitral valve regurgitation. Moderate to severe mitral stenosis. Moderate mitral annular calcification. The mean mitral valve gradient is 10.0 mmHg.  6. Tricuspid valve regurgitation is mild to moderate.  7. The  aortic valve is tricuspid. There is moderate calcification of the aortic valve. There is moderate thickening of the aortic valve. Aortic valve regurgitation is mild. Moderate aortic valve stenosis.  8. The inferior vena cava is normal in size with <50% respiratory variability, suggesting right atrial pressure of 8 mmHg. FINDINGS  Left Ventricle: Left ventricular ejection fraction, by estimation, is >75%. The left ventricle has hyperdynamic function. The left ventricle has no regional wall motion abnormalities. The left ventricular internal cavity size was normal in size. There is moderate left ventricular hypertrophy. Left ventricular diastolic parameters are consistent with Grade II diastolic dysfunction (pseudonormalization). Elevated left atrial pressure. Right Ventricle: The ventricular septum is flattened in systole and diastole consistent with RV pressure and volume overload. Relatively preserved RV anular vertical motion however decreased free wall contraction in the horizontal plane, TAPSE and S' likely poor indicators for RV function. The right ventricular size is moderately enlarged. Right vetricular wall thickness was not well visualized. Right ventricular systolic function is moderately reduced. Tricuspid regurgitation signal is inadequate for assessing PA pressure. Left Atrium: Left atrial size was moderately dilated. Right Atrium: Right atrial size was not well visualized. Pericardium: A small pericardial effusion is present. The pericardial effusion is circumferential. Mitral Valve: The mitral valve is abnormal. There is moderate thickening of the mitral valve leaflet(s). There is moderate calcification of the mitral valve leaflet(s). Moderate mitral annular calcification. Mild to moderate mitral valve regurgitation. Moderate to severe mitral valve stenosis. The mean mitral valve gradient is 10.0 mmHg. Tricuspid Valve: The tricuspid valve is not well visualized. Tricuspid valve regurgitation is mild  to moderate. No evidence of tricuspid stenosis. Aortic Valve: The aortic valve is tricuspid. There is moderate calcification of the aortic valve. There is moderate thickening of the aortic valve. There is moderate aortic valve annular calcification. Aortic valve regurgitation is mild. Aortic regurgitation PHT measures 314 msec. Moderate aortic stenosis is present. Aortic valve mean gradient measures 27.6 mmHg. Aortic valve peak gradient measures 44.8 mmHg. Aortic valve area, by VTI measures 1.11 cm. Pulmonic Valve: The pulmonic valve was not well visualized. Pulmonic valve regurgitation is not visualized. No evidence of pulmonic stenosis. Aorta: The aortic root is normal in size and structure. Venous: The inferior vena cava is normal in size with less than 50% respiratory variability, suggesting right atrial pressure of 8 mmHg. IAS/Shunts: The interatrial septum was not well visualized.  LEFT VENTRICLE PLAX 2D LVIDd:         3.80 cm   Diastology LVIDs:         2.50 cm   LV e' medial:    4.45 cm/s LV PW:         1.30 cm   LV E/e' medial:  48.5 LV IVS:        1.20 cm   LV e' lateral:   5.35 cm/s LVOT diam:     1.90 cm   LV E/e' lateral: 40.4 LV SV:         82 LV SV Index:   46 LVOT Area:     2.84 cm  RIGHT VENTRICLE RV S prime:     11.20 cm/s TAPSE (M-mode): 1.6 cm LEFT ATRIUM  Index        RIGHT ATRIUM           Index LA diam:        5.10 cm 2.87 cm/m   RA Area:     14.60 cm LA Vol (A2C):   74.9 ml 42.14 ml/m  RA Volume:   34.40 ml  19.35 ml/m LA Vol (A4C):   78.0 ml 43.88 ml/m LA Biplane Vol: 75.8 ml 42.64 ml/m  AORTIC VALVE AV Area (Vmax):    1.17 cm AV Area (Vmean):   1.04 cm AV Area (VTI):     1.11 cm AV Vmax:           334.80 cm/s AV Vmean:          247.600 cm/s AV VTI:            0.740 m AV Peak Grad:      44.8 mmHg AV Mean Grad:      27.6 mmHg LVOT Vmax:         138.00 cm/s LVOT Vmean:        91.000 cm/s LVOT VTI:          0.290 m LVOT/AV VTI ratio: 0.39 AI PHT:            314 msec  AORTA  Ao Root diam: 3.30 cm Ao Asc diam:  3.20 cm MITRAL VALVE MV Area (PHT): 3.65 cm     SHUNTS MV Mean grad:  10.0 mmHg    Systemic VTI:  0.29 m MV Decel Time: 208 msec     Systemic Diam: 1.90 cm MV E velocity: 216.00 cm/s MV A velocity: 145.00 cm/s MV E/A ratio:  1.49 Carlyle Dolly MD Electronically signed by Carlyle Dolly MD Signature Date/Time: 07/18/2021/3:50:11 PM    Final    CT Renal Stone Study  Result Date: 07/16/2021 CLINICAL DATA:  Acute renal failure. EXAM: CT ABDOMEN AND PELVIS WITHOUT CONTRAST TECHNIQUE: Multidetector CT imaging of the abdomen and pelvis was performed following the standard protocol without IV contrast. COMPARISON:  None. FINDINGS: Lower chest: Mild atelectasis is seen within the bilateral lung bases. Small bilateral pleural effusions are noted, right greater than left. Hepatobiliary: No focal liver abnormality is seen. Subcentimeter gallstones are seen within the lumen of an otherwise normal-appearing gallbladder. There is no evidence of biliary dilatation. Pancreas: Unremarkable. No pancreatic ductal dilatation or surrounding inflammatory changes. Spleen: Normal in size without focal abnormality. Adrenals/Urinary Tract: Adrenal glands are unremarkable. Kidneys are normal in size, without renal calculi or hydronephrosis. 7 mm and 3 mm foci of fat density are noted within the mid right kidney. A small amount of air is seen within the lumen of the urinary bladder. A very mild amount of surrounding inflammatory fat stranding is seen without evidence of urinary bladder wall thickening Stomach/Bowel: There is a small hiatal hernia. The appendix is surgically absent. No evidence of bowel wall thickening, distention, or inflammatory changes. Noninflamed diverticula are seen throughout the large bowel. Vascular/Lymphatic: Aortic atherosclerosis. No enlarged abdominal or pelvic lymph nodes. Reproductive: Uterus and bilateral adnexa are unremarkable. Other: No abdominal wall hernia or  abnormality. No abdominopelvic ascites. Musculoskeletal: Multilevel degenerative changes seen throughout the lumbar spine, most prominent at the levels of L4-L5 and L5-S1. IMPRESSION: 1. Findings involving the urinary bladder which may represent sequelae associated with cystitis. Correlation with urinalysis is recommended. 2. Small bilateral pleural effusions, right greater than left. 3. Cholelithiasis. 4. Colonic diverticulosis. 5. Small hiatal hernia. 6. Aortic atherosclerosis. Aortic Atherosclerosis (  ICD10-I70.0). Electronically Signed   By: Virgina Norfolk M.D.   On: 07/16/2021 20:58   ECHO TEE  Result Date: 07/21/2021    TRANSESOPHOGEAL ECHO REPORT   Patient Name:   Alisha [name] [facility] Date of Exam: 07/21/2021 Medical Rec #:  [id number]      Height:       65.0 in Accession #:    [id number]     Weight:       151.2 lb Date of Birth:  February 20, 1941     BSA:          1.757 m Patient Age:    20 years       BP:           105/60 mmHg Patient Gender: F              HR:           70 bpm. Exam Location:  Inpatient Procedure: Transesophageal Echo, Color Doppler, 3D Echo and Cardiac Doppler Indications:    AS, MS  History:        Patient has prior history of Echocardiogram examinations.  Sonographer:    Dustin Flock RDCS Referring Phys: [phone] Wilmer Floor SIMMONS PROCEDURE: The transesophogeal probe was passed without difficulty through the esophogus of the patient. Sedation performed by different physician. The patient developed no complications during the procedure. IMPRESSIONS  1. Left ventricular ejection fraction, by estimation, is 60 to 65%. The left ventricle has normal function. The left ventricle has no regional wall motion abnormalities. There is mild left ventricular hypertrophy.  2. Peak RV-RA gradient 40 mmHg. Right ventricular systolic function is mildly reduced. The right ventricular size is moderately enlarged.  3. Left atrial size was moderately dilated. No left atrial/left atrial appendage thrombus was  detected.  4. Right atrial size was moderately dilated.  5. 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 Mild to moderate mitral valve regurgitation. Moderate mitral stenosis. The mean mitral valve gradient is 6.0 mmHg with MVA 1.3 cm^2 by VTI. Moderate to severe mitral annular calcification.  6. The aortic valve is tricuspid. Aortic valve regurgitation is mild. Severe aortic valve stenosis. Aortic valve area, by VTI measures 0.87 cm. Aortic valve mean gradient measures 46.0 mmHg.  7. A small pericardial effusion is present. FINDINGS  Left Ventricle: Left ventricular ejection fraction, by estimation, is 60 to 65%. The left ventricle has normal function. The left ventricle has no regional wall motion abnormalities. The left ventricular internal cavity size was normal in size. There is  mild left ventricular hypertrophy. Right Ventricle: Peak RV-RA gradient 40 mmHg. The right ventricular size is moderately enlarged. No increase in right ventricular wall thickness. Right ventricular systolic function is mildly reduced. Left Atrium: Left atrial size was moderately dilated. No left atrial/left atrial appendage thrombus was detected. Right Atrium: Right atrial size was moderately dilated. Pericardium: A small pericardial effusion is present. Mitral Valve: The mitral valve is rheumatic. There is moderate calcification of the mitral valve leaflet(s). Moderate to severe mitral annular calcification. Mild to moderate mitral valve regurgitation. Moderate mitral valve stenosis. MV peak gradient, 16.2 mmHg. The mean mitral valve gradient is 6.0 mmHg. Tricuspid Valve: The tricuspid valve is normal in structure. Tricuspid valve regurgitation is mild. Aortic Valve: The aortic valve is tricuspid. Aortic valve regurgitation is mild. Severe aortic stenosis is present. Aortic valve mean gradient measures 46.0 mmHg. Aortic valve peak gradient measures 74.1 mmHg. Aortic valve area, by VTI measures 0.87 cm. Pulmonic Valve: The  pulmonic valve  was normal in structure. Pulmonic valve regurgitation is not visualized. Aorta: The aortic root is normal in size and structure. IAS/Shunts: No atrial level shunt detected by color flow Doppler.  LEFT VENTRICLE PLAX 2D LVOT diam:     2.00 cm LV SV:         83 LV SV Index:   47 LVOT Area:     3.14 cm  AORTIC VALVE AV Area (Vmax):    0.89 cm AV Area (Vmean):   0.80 cm AV Area (VTI):     0.87 cm AV Vmax:           430.50 cm/s AV Vmean:          303.500 cm/s AV VTI:            0.953 m AV Peak Grad:      74.1 mmHg AV Mean Grad:      46.0 mmHg LVOT Vmax:         122.00 cm/s LVOT Vmean:        76.900 cm/s LVOT VTI:          0.264 m LVOT/AV VTI ratio: 0.28 MITRAL VALVE               TRICUSPID VALVE MV Area (PHT): 1.83 cm    TR Peak grad:   39.7 mmHg MV Area VTI:   1.30 cm    TR Vmax:        315.00 cm/s MV Peak grad:  16.2 mmHg MV Mean grad:  6.0 mmHg    SHUNTS MV Vmax:       2.01 m/s    Systemic VTI:  0.26 m MV Vmean:      113.3 cm/s  Systemic Diam: 2.00 cm Dalton McleanMD Electronically signed by Franki Monte Signature Date/Time: 07/21/2021/2:35:24 PM    Final      Discharge Exam: Vitals:   07/23/21 2005 07/24/21 0357  BP: (!) 117/56 108/61  Pulse: (!) 41 67  Resp: 17 20  Temp: 98.3 F (36.8 C) 98.8 F (37.1 C)  SpO2: 92% 99%   Vitals:   07/23/21 0548 07/23/21 1227 07/23/21 2005 07/24/21 0357  BP: (!) 134/92 116/63 (!) 117/56 108/61  Pulse: 70 63 (!) 41 67  Resp: 15 17 17 20   Temp: 97.9 F (36.6 C) 98.3 F (36.8 C) 98.3 F (36.8 C) 98.8 F (37.1 C)  TempSrc: Oral Oral Oral Oral  SpO2: 92% 92% 92% 99%  Weight: 67 kg   66.2 kg  Height:        General: Pt is alert, awake, not in acute distress Cardiovascular: RRR, S1/S2 +, positive murmur. Respiratory: CTA bilaterally, no wheezing, no rhonchi Abdominal: Soft, NT, ND, bowel sounds + Extremities: no edema, no cyanosis    The results of significant diagnostics from this hospitalization (including imaging, microbiology,  ancillary and laboratory) are listed below for reference.     Microbiology: Recent Results (from the past 240 hour(s))  Resp Panel by RT-PCR (Flu A&B, Covid) Nasopharyngeal Swab     Status: None   Collection Time: 07/17/21  3:05 AM   Specimen: Nasopharyngeal Swab; Nasopharyngeal(NP) swabs in vial transport medium  Result Value Ref Range Status   SARS Coronavirus 2 by RT PCR NEGATIVE NEGATIVE Final    Comment: (NOTE) SARS-CoV-2 target nucleic acids are NOT DETECTED.  The SARS-CoV-2 RNA is generally detectable in upper respiratory specimens during the acute phase of infection. The lowest concentration of SARS-CoV-2 viral copies this assay can detect is 138  copies/mL. A negative result does not preclude SARS-Cov-2 infection and should not be used as the sole basis for treatment or other patient management decisions. A negative result may occur with  improper specimen collection/handling, submission of specimen other than nasopharyngeal swab, presence of viral mutation(s) within the areas targeted by this assay, and inadequate number of viral copies(<138 copies/mL). A negative result must be combined with clinical observations, patient history, and epidemiological information. The expected result is Negative.  Fact Sheet for Patients:  EntrepreneurPulse.com.au  Fact Sheet for Healthcare Providers:  IncredibleEmployment.be  This test is no t yet approved or cleared by the [place] FDA and  has been authorized for detection and/or diagnosis of SARS-CoV-2 by FDA under an Emergency Use Authorization (EUA). This EUA will remain  in effect (meaning this test can be used) for the duration of the COVID-19 declaration under Section 564(b)(1) of the Act, 21 U.S.C.section 360bbb-3(b)(1), unless the authorization is terminated  or revoked sooner.       Influenza A by PCR NEGATIVE NEGATIVE Final   Influenza B by PCR NEGATIVE NEGATIVE Final     Comment: (NOTE) The Xpert Xpress SARS-CoV-2/FLU/RSV plus assay is intended as an aid in the diagnosis of influenza from Nasopharyngeal swab specimens and should not be used as a sole basis for treatment. Nasal washings and aspirates are unacceptable for Xpert Xpress SARS-CoV-2/FLU/RSV testing.  Fact Sheet for Patients: EntrepreneurPulse.com.au  Fact Sheet for Healthcare Providers: IncredibleEmployment.be  This test is not yet approved or cleared by the [place] FDA and has been authorized for detection and/or diagnosis of SARS-CoV-2 by FDA under an Emergency Use Authorization (EUA). This EUA will remain in effect (meaning this test can be used) for the duration of the COVID-19 declaration under Section 564(b)(1) of the Act, 21 U.S.C. section 360bbb-3(b)(1), unless the authorization is terminated or revoked.  Performed at [facility], [place] [address] [place], [place] [address]      Labs: BNP (last 3 results) Recent Labs    06/18/21 1225 07/17/21 0336 07/19/21 0103  BNP 986.2* 1,247.8* 6,283.1*   Basic Metabolic Panel: Recent Labs  Lab 07/18/21 0342 07/19/21 0103 07/20/21 0428 07/21/21 0259 07/21/21 1410 07/22/21 0309 07/23/21 0355 07/24/21 0506  NA 137   < > 138 138 143 138 138 134*  K 3.7   < > 3.7 3.4* 3.3* 3.4* 4.0 3.2*  CL 111   < > 108 108 108 108 106 100  CO2 14*   < > 17* 19*  --  21* 23 24  GLUCOSE 131*   < > 137* 113* 147* 133* 128* 147*  BUN 89*   < > 83* 83* 87* 87* 76* 72*  CREATININE 3.78*   < > 3.45* 3.40* 3.50* 3.19* 3.13* 3.07*  CALCIUM 8.3*   < > 8.5* 8.2*  --  8.4* 8.3* 8.1*  MG 1.9  --  2.1  --   --   --   --   --    < > = values in this interval not displayed.   Liver Function Tests: No results for input(s): AST, ALT, ALKPHOS, BILITOT, PROT, ALBUMIN in the last 168 hours. No results for input(s): LIPASE, AMYLASE in the last 168 hours. No results for input(s): AMMONIA in the last 168  hours. CBC: Recent Labs  Lab 07/20/21 0428 07/21/21 0259 07/21/21 1410 07/21/21 1546 07/22/21 0309 07/23/21 0355 07/24/21 0506  WBC 8.6 7.9  --   --  9.3 8.4 9.0  HGB 8.1*  6.8* 8.5* 9.4* 8.5* 8.7* 8.5*  HCT 26.5* 22.3* 25.0* 29.4* 27.5* 28.1* 27.2*  MCV 87.2 88.5  --   --  86.5 87.5 87.7  PLT 345 298  --   --  290 283 268   Cardiac Enzymes: No results for input(s): CKTOTAL, CKMB, CKMBINDEX, TROPONINI in the last 168 hours. BNP: Invalid input(s): POCBNP CBG: No results for input(s): GLUCAP in the last 168 hours. D-Dimer No results for input(s): DDIMER in the last 72 hours. Hgb A1c No results for input(s): HGBA1C in the last 72 hours. Lipid Profile No results for input(s): CHOL, HDL, LDLCALC, TRIG, CHOLHDL, LDLDIRECT in the last 72 hours. Thyroid function studies No results for input(s): TSH, T4TOTAL, T3FREE, THYROIDAB in the last 72 hours.  Invalid input(s): FREET3 Anemia work up No results for input(s): VITAMINB12, FOLATE, FERRITIN, TIBC, IRON, RETICCTPCT in the last 72 hours. Urinalysis    Component Value Date/Time   COLORURINE YELLOW 07/18/2021 0342   APPEARANCEUR HAZY (A) 07/18/2021 0342   LABSPEC 1.009 07/18/2021 0342   PHURINE 5.0 07/18/2021 0342   GLUCOSEU NEGATIVE 07/18/2021 0342   HGBUR NEGATIVE 07/18/2021 0342   BILIRUBINUR NEGATIVE 07/18/2021 0342   KETONESUR NEGATIVE 07/18/2021 0342   PROTEINUR NEGATIVE 07/18/2021 0342   NITRITE NEGATIVE 07/18/2021 0342   LEUKOCYTESUR LARGE (A) 07/18/2021 0342   Sepsis Labs Invalid input(s): PROCALCITONIN,  WBC,  LACTICIDVEN Microbiology Recent Results (from the past 240 hour(s))  Resp Panel by RT-PCR (Flu A&B, Covid) Nasopharyngeal Swab     Status: None   Collection Time: 07/17/21  3:05 AM   Specimen: Nasopharyngeal Swab; Nasopharyngeal(NP) swabs in vial transport medium  Result Value Ref Range Status   SARS Coronavirus 2 by RT PCR NEGATIVE NEGATIVE Final    Comment: (NOTE) SARS-CoV-2 target nucleic acids are  NOT DETECTED.  The SARS-CoV-2 RNA is generally detectable in upper respiratory specimens during the acute phase of infection. The lowest concentration of SARS-CoV-2 viral copies this assay can detect is 138 copies/mL. A negative result does not preclude SARS-Cov-2 infection and should not be used as the sole basis for treatment or other patient management decisions. A negative result may occur with  improper specimen collection/handling, submission of specimen other than nasopharyngeal swab, presence of viral mutation(s) within the areas targeted by this assay, and inadequate number of viral copies(<138 copies/mL). A negative result must be combined with clinical observations, patient history, and epidemiological information. The expected result is Negative.  Fact Sheet for Patients:  EntrepreneurPulse.com.au  Fact Sheet for Healthcare Providers:  IncredibleEmployment.be  This test is no t yet approved or cleared by the [place] FDA and  has been authorized for detection and/or diagnosis of SARS-CoV-2 by FDA under an Emergency Use Authorization (EUA). This EUA will remain  in effect (meaning this test can be used) for the duration of the COVID-19 declaration under Section 564(b)(1) of the Act, 21 U.S.C.section 360bbb-3(b)(1), unless the authorization is terminated  or revoked sooner.       Influenza A by PCR NEGATIVE NEGATIVE Final   Influenza B by PCR NEGATIVE NEGATIVE Final    Comment: (NOTE) The Xpert Xpress SARS-CoV-2/FLU/RSV plus assay is intended as an aid in the diagnosis of influenza from Nasopharyngeal swab specimens and should not be used as a sole basis for treatment. Nasal washings and aspirates are unacceptable for Xpert Xpress SARS-CoV-2/FLU/RSV testing.  Fact Sheet for Patients: EntrepreneurPulse.com.au  Fact Sheet for Healthcare Providers: IncredibleEmployment.be  This test is not  yet approved or cleared by the  [place] Architectural technologist and has been authorized for detection and/or diagnosis of SARS-CoV-2 by FDA under an Print production planner (EUA). This EUA will remain in effect (meaning this test can be used) for the duration of the COVID-19 declaration under Section 564(b)(1) of the Act, 21 U.S.C. section 360bbb-3(b)(1), unless the authorization is terminated or revoked.  Performed at [facility], [place] [address] [place], [place] [address]      Time coordinating discharge: Over 30 minutes  SIGNED:   Darliss Cheney, MD  [facility] 07/24/2021, 8:06 AM  If 7PM-7AM, please contact night-coverage [email or web address]

## 2021-07-26 ENCOUNTER — Encounter (HOSPITAL_COMMUNITY): Payer: Self-pay

## 2021-07-27 ENCOUNTER — Encounter: Payer: Self-pay | Admitting: Medical

## 2021-07-27 ENCOUNTER — Telehealth: Payer: Self-pay

## 2021-07-27 MED ORDER — METHYLPREDNISOLONE 4 MG PO TABS: ORAL_TABLET | ORAL | 0 refills | Status: DC

## 2021-07-27 NOTE — Addendum Note (Signed)
Addended by: Anabel Halon on: 07/27/2021 04:28 PM   Modules accepted: Orders

## 2021-07-27 NOTE — Telephone Encounter (Signed)
Transition Care Management Follow-up Telephone Call Date of discharge and from where: 07/24/2021-[facility] How have you been since you were released from the hospital? Having an arthritis flare up which is causing lots of pain.  Any questions or concerns? No  Items Reviewed: Did the pt receive and understand the discharge instructions provided? Yes  Medications obtained and verified? Yes  Other? Yes  Any new allergies since your discharge? No  Dietary orders reviewed? Yes Do you have support at home? Yes   Home Care and Equipment/Supplies: Were home health services ordered? no If so, what is the name of the agency? N/a  Has the agency set up a time to come to the patient's home? not applicable Were any new equipment or medical supplies ordered?  No What is the name of the medical supply agency? N/a Were you able to get the supplies/equipment? not applicable Do you have any questions related to the use of the equipment or supplies? N/a  Functional Questionnaire: (I = Independent and D = Dependent) ADLs: I with assistance  Bathing/Dressing- I with assistance  Meal Prep- D  Eating- I  Maintaining continence- I with assistance  Transferring/Ambulation- I with walker/cane  Managing Meds- I  Follow up appointments reviewed:  PCP Hospital f/u appt confirmed? Yes  Scheduled to see Mackie Pai on 08/03/2021 @ 3:00. [facility] f/u appt confirmed? Yes  Scheduled to see Cardiology on 07/28/2021 @ 1:30. Are transportation arrangements needed? No  If their condition worsens, is the pt aware to call PCP or go to the Emergency Dept.? Yes Was the patient provided with contact information for the PCP's office or ED? Yes Was to pt encouraged to call back with questions or concerns? Yes

## 2021-07-28 ENCOUNTER — Other Ambulatory Visit: Payer: Self-pay | Admitting: Internal Medicine

## 2021-07-28 ENCOUNTER — Encounter (HOSPITAL_COMMUNITY): Payer: Self-pay

## 2021-07-28 ENCOUNTER — Other Ambulatory Visit: Payer: Self-pay

## 2021-07-28 ENCOUNTER — Ambulatory Visit (HOSPITAL_COMMUNITY)
Admission: RE | Admit: 2021-07-28 | Discharge: 2021-07-28 | Disposition: A | Discharge status: Home / Self Care | Payer: Medicare Other | Source: Ambulatory Visit | Attending: Cardiology | Admitting: Cardiology

## 2021-07-28 VITALS — BP 126/52 | HR 66 | Wt 140.4 lb

## 2021-07-28 DIAGNOSIS — Z8719 Personal history of other diseases of the digestive system: Secondary | ICD-10-CM | POA: Diagnosis not present

## 2021-07-28 DIAGNOSIS — N1832 Chronic kidney disease, stage 3b: Secondary | ICD-10-CM

## 2021-07-28 DIAGNOSIS — I080 Rheumatic disorders of both mitral and aortic valves: Secondary | ICD-10-CM | POA: Diagnosis not present

## 2021-07-28 DIAGNOSIS — I5032 Chronic diastolic (congestive) heart failure: Secondary | ICD-10-CM | POA: Insufficient documentation

## 2021-07-28 DIAGNOSIS — Z9981 Dependence on supplemental oxygen: Secondary | ICD-10-CM | POA: Diagnosis not present

## 2021-07-28 DIAGNOSIS — Z86718 Personal history of other venous thrombosis and embolism: Secondary | ICD-10-CM | POA: Diagnosis not present

## 2021-07-28 DIAGNOSIS — E785 Hyperlipidemia, unspecified: Secondary | ICD-10-CM | POA: Insufficient documentation

## 2021-07-28 DIAGNOSIS — Z8616 Personal history of COVID-19: Secondary | ICD-10-CM | POA: Insufficient documentation

## 2021-07-28 DIAGNOSIS — G4733 Obstructive sleep apnea (adult) (pediatric): Secondary | ICD-10-CM | POA: Diagnosis not present

## 2021-07-28 DIAGNOSIS — M1990 Unspecified osteoarthritis, unspecified site: Secondary | ICD-10-CM | POA: Insufficient documentation

## 2021-07-28 DIAGNOSIS — I739 Peripheral vascular disease, unspecified: Secondary | ICD-10-CM | POA: Insufficient documentation

## 2021-07-28 DIAGNOSIS — R531 Weakness: Secondary | ICD-10-CM | POA: Insufficient documentation

## 2021-07-28 DIAGNOSIS — Z79899 Other long term (current) drug therapy: Secondary | ICD-10-CM | POA: Insufficient documentation

## 2021-07-28 DIAGNOSIS — I272 Pulmonary hypertension, unspecified: Secondary | ICD-10-CM | POA: Diagnosis present

## 2021-07-28 DIAGNOSIS — D500 Iron deficiency anemia secondary to blood loss (chronic): Secondary | ICD-10-CM

## 2021-07-28 DIAGNOSIS — M1A39X0 Chronic gout due to renal impairment, multiple sites, without tophus (tophi): Secondary | ICD-10-CM

## 2021-07-28 DIAGNOSIS — Z9989 Dependence on other enabling machines and devices: Secondary | ICD-10-CM | POA: Diagnosis not present

## 2021-07-28 DIAGNOSIS — R55 Syncope and collapse: Secondary | ICD-10-CM | POA: Insufficient documentation

## 2021-07-28 DIAGNOSIS — D509 Iron deficiency anemia, unspecified: Secondary | ICD-10-CM | POA: Diagnosis not present

## 2021-07-28 DIAGNOSIS — I050 Rheumatic mitral stenosis: Secondary | ICD-10-CM

## 2021-07-28 DIAGNOSIS — I130 Hypertensive heart and chronic kidney disease with heart failure and stage 1 through stage 4 chronic kidney disease, or unspecified chronic kidney disease: Secondary | ICD-10-CM | POA: Diagnosis present

## 2021-07-28 DIAGNOSIS — E1122 Type 2 diabetes mellitus with diabetic chronic kidney disease: Secondary | ICD-10-CM | POA: Insufficient documentation

## 2021-07-28 DIAGNOSIS — Z7189 Other specified counseling: Secondary | ICD-10-CM

## 2021-07-28 DIAGNOSIS — N184 Chronic kidney disease, stage 4 (severe): Secondary | ICD-10-CM | POA: Insufficient documentation

## 2021-07-28 DIAGNOSIS — M159 Polyosteoarthritis, unspecified: Secondary | ICD-10-CM

## 2021-07-28 DIAGNOSIS — I350 Nonrheumatic aortic (valve) stenosis: Secondary | ICD-10-CM

## 2021-07-28 LAB — BASIC METABOLIC PANEL
Anion gap: 13 (ref 5–15)
BUN: 104 mg/dL — ABNORMAL HIGH (ref 8–23)
CO2: 21 mmol/L — ABNORMAL LOW (ref 22–32)
Calcium: 8.2 mg/dL — ABNORMAL LOW (ref 8.9–10.3)
Chloride: 98 mmol/L (ref 98–111)
Creatinine, Ser: 4.38 mg/dL — ABNORMAL HIGH (ref 0.44–1.00)
GFR, Estimated: 10 mL/min — ABNORMAL LOW (ref >60)
Glucose, Bld: 187 mg/dL — ABNORMAL HIGH (ref 70–99)
Potassium: 3.8 mmol/L (ref 3.5–5.1)
Sodium: 132 mmol/L — ABNORMAL LOW (ref 135–145)

## 2021-07-28 LAB — VITAMIN B12: Vitamin B-12: 115 pg/mL — ABNORMAL LOW (ref 180–914)

## 2021-07-28 LAB — CBC
HCT: 26.6 % — ABNORMAL LOW (ref 36.0–46.0)
Hemoglobin: 8.2 g/dL — ABNORMAL LOW (ref 12.0–15.0)
MCH: 27.1 pg (ref 26.0–34.0)
MCHC: 30.8 g/dL (ref 30.0–36.0)
MCV: 87.8 fL (ref 80.0–100.0)
Platelets: 334 10*3/uL (ref 150–400)
RBC: 3.03 MIL/uL — ABNORMAL LOW (ref 3.87–5.11)
RDW: 18 % — ABNORMAL HIGH (ref 11.5–15.5)
WBC: 9.6 10*3/uL (ref 4.0–10.5)
nRBC: 0 % (ref 0.0–0.2)

## 2021-07-28 LAB — FERRITIN: Ferritin: 145 ng/mL (ref 11–307)

## 2021-07-28 LAB — IRON AND TIBC
Iron: 20 ug/dL — ABNORMAL LOW (ref 28–170)
Saturation Ratios: 11 % (ref 10.4–31.8)
TIBC: 175 ug/dL — ABNORMAL LOW (ref 250–450)
UIBC: 155 ug/dL

## 2021-07-28 NOTE — Progress Notes (Signed)
ReDS Vest / Clip - 07/28/21 1400       ReDS Vest / Clip   Station Marker A    Ruler Value 31    ReDS Value Range Low volume    ReDS Actual Value 34

## 2021-07-28 NOTE — Telephone Encounter (Signed)
Needs to follow up I haven't seen her since starting allopurinol but her dose is undoubtedly still too low. Can increase to 2 tablets daily and f/u in 1 month.

## 2021-07-28 NOTE — Patient Instructions (Addendum)
Labs done today. We will contact you only if your labs are abnormal.  No medication changes were made. Please continue all current medications as prescribed.  Your physician recommends that you schedule a follow-up appointment in: 2 weeks with our NP/[facility] here in our office and keep your pending appointment with Dr. Aundra Dubin.   If you have any questions or concerns before your next appointment please send [place] a message through [place] or call our office at [phone].    TO LEAVE A MESSAGE FOR THE NURSE SELECT OPTION 2, PLEASE LEAVE A MESSAGE INCLUDING: YOUR NAME DATE OF BIRTH CALL BACK NUMBER REASON FOR CALL**this is important as we prioritize the call backs  YOU WILL RECEIVE A CALL BACK THE SAME DAY AS LONG AS YOU CALL BEFORE 4:00 PM   Do the following things EVERYDAY: Weigh yourself in the morning before breakfast. Write it down and keep it in a log. Take your medicines as prescribed Eat low salt foods--Limit salt (sodium) to 2000 mg per day.  Stay as active as you can everyday Limit all fluids for the day to less than 2 liters   At the [facility], you and your health needs are our priority. As part of our continuing mission to provide you with exceptional heart care, we have created designated Provider Care Teams. These Care Teams include your primary Cardiologist (physician) and Advanced Practice Providers (APPs- Physician Assistants and Nurse Practitioners) who all work together to provide you with the care you need, when you need it.   You may see any of the following providers on your designated Care Team at your next follow up: Dr Glori Bickers Dr Haynes Kerns, NP Lyda Jester, [place] Audry Riles, PharmD   Please be sure to bring in all your medications bottles to every appointment.

## 2021-07-28 NOTE — Telephone Encounter (Signed)
Next Visit: no follow up scheduled.   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  She could be candidate for [facility] treatment we will also check G6PD function.  Assuming looks okay would plan to start very low-dose allopurinol with monthly titration  Labs: 07/24/2021 RBC 3.10, Hgb 8.5, Hct 27.2, RDW 18.7, Sodium 134, Potassium 3.2, Glucose 147, BUN 72, Creat. 3.07, GFR 15, calcium 8.1  Okay to refill Allopurinol? When would you like patient to follow up?

## 2021-07-28 NOTE — Progress Notes (Addendum)
ID:  AANSHI [name], DOB [birth year], MRN [id number]   Provider location: [facility] Type of Visit: Established patient   PCP:  Elise Benne  HF Cardiologist:  Dr. Aundra Dubin   History of Present Illness: Alisha [name] is a 80 y.o. female who has a history of diabetes, HTN, PAD, hyperlipidemia. She was referred by Dr. Wynonia Lawman for evaluation of pulmonary hypertension.    For > 1 year, she has had significant exertional dyspnea. It has been steadily worsening, especially over the last few months.  She has been extensively worked up so far.  Echo in 4/18 showed preserved EF 65% with moderate pulmonary hypertension.  She had episodes of syncope in 5/18 and 10/18.  She wore an event monitor in 5/18 with no significant arrhythmia.  LHC/RHC in 1/19 showed normal PCWP and severely elevated PA pressure, no significant CAD.     At initial appointment in 1/19, she was noted to be hypoxemic with exertion and home oxygen was started for use with exertion.  I also started her on Opsumit. She had to stop this after about a week due to significantly increased exertional peripheral edema.  This resolved after Opsumit was stopped.    Echo in 4/19.  This showed EF 60-65%, mild MS, mild AS, moderately dilated RV with mildly decreased systolic function.    She was found to have Fe deficiency anemia, FOBT+.  EGD showed gastritis and c-scope showed diverticulosis and polyps in 4/19. She get IV Fe.  She is going to have a capsule endoscopy.    She tried to start ambrisentan, but developed worsening dyspnea and peripheral edema and low oxygen saturation.  This improved after ambrisentan was stopped.    She is now taking riociguat and Uptravi.  RHC in 4/20 showed normal filling pressures but severe pulmonary hypertension with PVR 6 WU.  Echo showed moderately dilated RV with normal systolic function, mild-moderate MS, mild AS.  [name] has now been titrated up to 2400 mcg bid and  riociguat to 1.5 mg tid.    She had COVID-19 PNA in 2/21, hospitalized for about a week.   Echo in 4/21 showed EF 50-93%, grade 2 diastolic dysfunction, normal RV, PASP 50 mmHg, probably mild mitral stenosis with mean gradient 7 mmHg and MVA 2 cm^2 by PHT.  Echo in 4/22 showed EF 60-65%, moderate LVH, moderate AS with mean gradient 27 mmHg and AVA 1.06 cm^2, moderate mitral stenosis with mean gradient 7 and MVA 1.2 cm^2, normal RV size and systolic function, PASP 41 mmHg.  TEE was done in 4/22 to more closely assess the mitral and aortic valves, EF 60-65%, d-shaped septum, mild RV enlargement with mildly decreased RV systolic function, peak RV-RA gradient 38 mmHg, moderate AS with AVA 1.31 cm^2 mean gradient 26 mmHg, mild-moderate MR with mild-moderate MS mean gradient 7 with MVA 1.7 cm^2 by VTI .  Returned for follow up 8/22 and was volume overloaded. Lasix stopped and torsemide started 60 mg bid. Amlodipine increased to 10. Instructed to wear oxygen with exertion.  Advised to go to ED after labs showed AKI on CKD IV, SCr up to 4.37. Diuretics held and given gentle IVF. Cardiology consulted to manage subsequent fluid overload. She was diuresed with IV lasix. Echo showed D-shaped septum, moderate RVE and moderate RV dysfunction, also concerning for worsening valvular HD. She underwent RHC showing mild-moderately elevated right and left filling pressures, preserved cardiac output and severe primarily pulmonary arterial hypertension. PVR 4.8  WU. 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 Seen by Structural Heart Team and felt not to be candidate for TAVR. IV diuretic changed to po torsemide with continuation of weekly metolazone. She was seen by palliative care and is DNR. She was discharged back to her apartment, weight 146 lbs.  Today he returns for post hospitalization HF follow up with her daughter. She is having a lot of  arthritis pain and has started steroids today. She is walking with walker and getting around her apartment OK but main issue is weakness/fatigue. She feels her breathing is OK as long as she is on her oxygen. She has not been urinating much since home from hospital. She has not taken metolazone in over a month. Appetite is poor. Denies CP, dizziness, palpitations, abnormal bleeding. Chronically sleeps reclined. No fever or chills. Weight at home has been steadily decreasing. Taking all medications.   ECG (personally reviewed): Sinus rhythm with PACs  ReDs: 34%  6 minute walk (1/19): 92 m, oxygen saturation dropped to 70%.   6 minute walk (3/19): 122 m 6 minute walk (4/19): 392 m 6 minute walk (9/19): 317 m 6 minute walk (1/20): 320 m 6 minute walk (8/20): 359 m 6 minute walk (10/20): 391 m 6 minute walk (9/21): 335 m, oxygen saturation 90-96% 6 minute walk (1/22): 305 m 6 minute walk (8/22): 311 m, oxygen saturation dropped as low at 79%   Labs (1/19): anti-Jo1 negative, RF borderline positive, SCL-70 negative, HIV negative. ESR 21. BNP 1129.  Labs (3/19): ANA + => anti-dsDNA negative, SSA/SSB negative, anti-Sm negative, anti-RNP negative. K 3.8 => 3.7, creatinine 1.4 => 1.55.  Labs (4/19): K 4.1 => 3.1, creatinine 1.58 => 1.2, BNP 1902, CCP Ab negative.  Labs (6/19): hgb 9, creatinine 1.66, K 3 Labs (7/19): K 4.9, creatinine 1.7 Labs (8/19): K 3.6, creatinine 1.36 Labs (9/19): K 4.1, creatinine 1.55, BNP 641 Labs (10/19): K 4.7, creatinine 1.88, BNP 595 Labs (1/20): Hgb 12.2 Labs (4/20): K 4.2, creatinine 2.2, hgb 8.4 Labs (7/20): K 3.2, creatinine 2.07, hgb 8 Labs (8/20): K 3.7, creatinine 1.46 Labs (9/20): K 4, creatinine 1.5 Labs (10/20): K 4.5, creatinine 1.75 Labs (2/21): K 4.1, creatinine 1.78 Labs (7/21): K 3.7, creatinine 1.22, hgb 12.2 Labs (1/22): TSH normal, Mg 2.3, creatinine 1.36, K 3, hgb 12.4, BNP 342 Labs (2/22): LDL 66 Labs (3/22): K 3.6, creatinine 1.02 Labs  (5/22): K 4.4, creatinine 1.62, BNP 823 Labs (8/22): K 3.6, creatinine 1.83 Labs (11/22): K 3.2, creatinine 3.07, hgb 8.7   PMH: 1. PAD: Non-healing left ankle ulcer.  - ABIs 9/12 were normal.  2. Type 2 diabetes 3. Hypothyroidism 4. CKD stage 3 5. Hyperlipidemia 6. HTN 7. IBS 8. OSA: on CPAP.  9. Event monitor 5/18: No significant abnormality.  10. DVT in 1995 11. Pulmonary hypertension: Echo (4/18) with EF 65%, mild MR, mild TR, mild mitral stenosis, moderate pulmonary hypertension. - CTA chest 10/17: No PE.  - RHC (1/19): mean RA 11, PA 87/31 mean 51, mean PCWP 6, CI 3.58, PVR 7 WU.  - PFTs (1/19): mild obstruction, some restriction => severely decreased DLCO. - High resolution CT chest (1/19): no interstitial lung disease or emphysema. 7 mm nodule RUL.  - anti-Jo1 negative, RF borderline positive, SCL-70 negative, HIV negative. ANA + => anti-dsDNA negative, SSA/SSB negative, anti-Sm negative, anti-RNP negative. - V/Q scan 2/19: No e/o chronic PE.  - Echo (4/19): EF 60-65%,  mild LVH, mild aortic stenosis, mild mitral stenosis, moderately dilated RV with mildly decreased systolic function, PASP 36 mmHg (poor envelope, may be underestimated).  - Does not tolerate ERAs (macitentan, ambrisentan).  - RHC (4/20): mean RA 5, PA 85/22 mean 51, mean PCWP 12, CI  3.5, PVR 6 WU.  - Echo (4/20): EF > 65%, moderate LVH, RV moderately dilated with normal systolic function, moderate pericardial effusion, mild-moderate mitral stenosis with mean gradient 7/MVA by PHT 2.48 cm^2, mild AS mean gradient 12.  - Echo (4/21): EF 43-32%, grade 2 diastolic dysfunction, normal RV, PASP 50 mmHg, probably mild mitral stenosis with mean gradient 7 mmHg and MVA 2 cm^2 by PHT, mild AS.  - Echo (4/22): EF 60-65%, moderate LVH, moderate AS with mean gradient 27 mmHg and AVA 1.06 cm^2, moderate mitral stenosis with mean gradient 7 and MVA 1.2 cm^2, normal RV size and systolic function, PASP 41 mmHg.  - TEE (4/22): EF  60-65%, d-shaped septum, mild RV enlargement with mildly decreased RV systolic function, peak RV-RA gradient 38 mmHg, moderate AS with AVA 1.31 cm^2 mean gradient 26 mmHg, mild-moderate MR with mild-moderate MS mean gradient 7 with MVA 1.7 cm^2 by VTI . 12. Coronary angiography 11/18 without significant disease.  - Coronary angiography in 1/19 with luminal irregularities 13. Mitral stenosis: Mild by cath in 1/19, mean gradient 2.6 mmHg. Mild on 4/19 echo.  - Mild-moderate by echo in 4/20.  - Mild by echo in 4/21.  - Moderate by echo in 4/22.  - Mild-moderate by TEE in 4/22.  14. Aortic stenosis: Mild on 4/19 echo.  - Mild on 4/20 echo.  - Mild on 4/21 echo.  - Moderate on 4/22 echo.  - Moderate on 4/22 TEE 15. Fe deficiency anemia: EGD showed gastritis and c-scope showed diverticulosis and polyps in 4/19. Capsule endoscopy was unrevealing.  16. Pulmonary nodule: CT chest 7/19 with RUL nodule.  17. Low back pain/sciatica 18. COVID-19 PNA in 2/21.   Current Outpatient Medications  Medication Sig Dispense Refill   ADEMPAS 2.5 MG TABS TAKE 1 TABLET THREE TIMES A DAY (Patient taking differently: Take 2.5 mg by mouth 3 (three) times daily.) 90 tablet 11   atorvastatin (LIPITOR) 20 MG tablet TAKE 1 TABLET BY MOUTH IN  THE MORNING 90 tablet 3   cetirizine (ZYRTEC) 10 MG tablet Take 10 mg by mouth daily as needed for allergies.     cholecalciferol (VITAMIN D) 25 MCG (1000 UNIT) tablet Take 1,000 Units by mouth daily.     fenofibrate 160 MG tablet Take 1 tablet (160 mg total) by mouth every morning. 30 tablet 2   ferrous sulfate 325 (65 FE) MG tablet Take 1 tablet (325 mg total) by mouth daily with breakfast. Morning and night 30 tablet 0   glucose blood (ACCU-CHEK AVIVA PLUS) test strip Check blood sugar TID dx: E11.9 100 each 12   levothyroxine (SYNTHROID) 125 MCG tablet Take 1 tablet (125 mcg total) by mouth daily before breakfast. 30 tablet 2   loperamide (IMODIUM A-D) 2 MG tablet Take 2 mg by  mouth daily as needed for diarrhea or loose stools.     loratadine (CLARITIN) 10 MG tablet Take 10 mg by mouth daily as needed for allergies.      methylPREDNISolone (MEDROL) 4 MG tablet Standard 6 day taper dose. 21 tablet 0   metoprolol succinate (TOPROL XL) 25 MG 24 hr tablet Take 1 tablet (25 mg total) by mouth 2 (two) times daily. 60 tablet 11  pantoprazole (PROTONIX) 40 MG tablet TAKE 1 TABLET BY MOUTH  TWICE DAILY 180 tablet 3   potassium chloride (KLOR-CON) 10 MEQ tablet Take 2 tablets (20 mEq total) by mouth daily. Take an extra 30mq with metolazone 60 tablet 0   torsemide 60 MG TABS Take 60 mg by mouth 2 (two) times daily. 30 tablet 0   UPTRAVI 1600 MCG TABS Take 1 tablet (1,600 mcg total) by mouth 2 (two) times daily. Take with 800 for a total of 2400 mg 60 tablet 11   UPTRAVI 800 MCG TABS Take 1 tablet (800 mcg total) by mouth 2 (two) times daily. Take with 1600 mcg for a total of 2400 mg 60 tablet 11   [START ON 07/31/2021] metolazone (ZAROXOLYN) 2.5 MG tablet Take 1 tablet (2.5 mg total) by mouth once a week. Take potassium 240m with this (Patient not taking: Reported on 07/28/2021) 4 tablet 0   Multiple Vitamins-Minerals (PRESERVISION AREDS 2 PO) Take 1 capsule by mouth 2 (two) times daily. (Patient not taking: Reported on 07/28/2021)     No current facility-administered medications for this encounter.    Allergies:   Other, Adhesive [tape], Macitentan, and Nickel   Social History:  The patient  reports that she has never smoked. She has never used smokeless tobacco. She reports current alcohol use. She reports that she does not use drugs.   Family History:  The patient's family history includes Alzheimer's disease in her father; Bleeding Disorder in her mother; Diabetes in her brother, mother, and sister; Healthy in her daughter and son; Heart disease in her brother and father.   ROS:  Please see the history of present illness.   All other systems are personally reviewed and  negative.   Exam:   BP (!) 126/52   Pulse 66   Wt 63.7 kg (140 lb 6.4 oz)   SpO2 100%   BMI 23.36 kg/m   General:  NAD. No resp difficulty[facility]ued-appearing. HEENT: Normal Neck: Supple. JVP to jaw Carotids 2+ bilat; no bruits. No lymphadenopathy or thryomegaly appreciated. Cor: PMI nondisplaced. Regular rate & rhythm. No rubs, gallops, 3/6 SEM Lungs: Clear, diminished in bases Abdomen: Soft, nontender, nondistended. No hepatosplenomegaly. No bruits or masses. Good bowel sounds. Extremities: No cyanosis, clubbing, rash, 1-2+ BLE, R>L, right hand swelling Neuro: Alert & oriented x 3, cranial nerves grossly intact. Moves all 4 extremities w/o difficulty. Affect pleasant.  Recent Labs: 09/18/2020: TSH 0.568 01/26/2021: Pro B Natriuretic peptide (BNP) 823.0 07/16/2021: ALT 9 07/19/2021: B Natriuretic Peptide 1,758.6 07/20/2021: Magnesium 2.1 07/24/2021: BUN 72; Creatinine, Ser 3.07; Hemoglobin 8.5; Platelets 268; Potassium 3.2; Sodium 134  Personally reviewed   Wt Readings from Last 3 Encounters:  07/28/21 63.7 kg (140 lb 6.4 oz)  07/24/21 66.2 kg (146 lb)  06/18/21 70.3 kg (155 lb)    ASSESSMENT AND PLAN: 1. Pulmonary hypertension with RV failure:  Severe PAH with PVR 7 WU on 1/19 RHC.  Etiology uncertain.  She has a history of DVT remotely but V/Q scan in 2/19 did not show evidence for chronic PE. She has OSA and uses CPAP.  Serologic workup showed only positive ANA but when additional serologies were done, they were all negative.  CT chest with no ILD or emphysema.  Valvular heart disease does not appear significant enough to cause this degree of PH.  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  She did not tolerate Opsumit or ambrisentan due to significant lower extremity edema and worsening dyspnea.  [place] in 4/20 showed severe pulmonary hypertension with normal filling pressures and preserved cardiac output.  Echo in 4/21 showed normal RV with PASP 50 mmHg, echo 4/22 showed normal RV  with PASP estimated 41 mmHg. TEE in 4/22 showed D-shaped septum with mild RV enlargement/mildly decreased RV systolic function.  At baseline, has oxygen for exertion.  Echo this admission with D-shaped septum, moderate RVE and moderate RV dysfunction, also concerning for worsening valvular HD.  RHC this admit showed mild-moderately elevated right and left filling pressures, preserved cardiac output and severe primarily pulmonary arterial hypertension. PVR 4.8 WU. 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  - Continue Adempas 2.5 mg tid.  - Continue Uptravi 2400 mcg BID.   2. Chronic diastolic CHF: In setting of pulmonary hypertension and valvular disease.  Worsened symptoms with volume overload on exam.  Unfortunately, also appears to have developed progressive cardiorenal syndrome.  RHC showed mild-moderately elevated right and left filling pressures, preserved cardiac output, mRA 10, mPCWP 20, CI 3.89. 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 NYHA III- early IIIb, confounded by OA pain and deconditioning. Weight down, ReDs 32%. Volume OK today, but I suspect is slowly building back up.  - Place compression hose. - Continue torsemide 60 mg bid + metolazone qwk.  Start metolazone on Saturday. BMET today. 3. Syncope: Suspect related to severe pulmonary hypertension.  None recently.   4. 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  With heavy calcification, I do not think that she would be a mitral valvuloplasty candidate.  I am worried for progressive valvular heart disease leading to worsening volume status as well as worsening cardiorenal syndrome.  Echo this admission with moderate-severe aortic stenosis (mean gradient 34 mmHg with AVA around 1.0 cm^2) and moderate-severe mitral stenosis (mean gradient 10 mmHg).  TEE 11/09 with LVEF 60-65%, severe aortic  valve stenosis with mean gradient of 46 mmHg and AVA 0.87 cm2, moderate mitral stenosis with mean gradient 6 mmHg - Seen by Valve Team. Not a candidate for TAVR 5. H/o DVT: No chronic PE on prior V/Q scan.  Not anticoagulated with persistent GI bleeding.  6. OSA: Continue CPAP qHS.  7. CKD Stage 3: Prior baseline creatinine has been around 1.6, recently with significant increase.  Admitted with creatinine at 4.4, now down to 3.07. Concern for progressive cardiorenal syndrome possibly with worsening valvular HD.  - BMET today. 8. H/o GI bleeding: Recurrent.  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 In setting of severe AS, ?Heyde syndrome.  Iron deficient, had been receiving IV iron through [facility]  - Seen by GI. She has opted for no further endoscopic evaluation. - Started on Octreotide inpatient. ? continue outpatient.  - Continue PPI. - CBC today. Will also obtain iron studies and forward to GI and her PCP today. 9. OA:  Quite limited with mobility due to this today. Starting methylprednisolone today per PCP. Will need to keep close eye on volume status. 10. GOC: She was seen by PMT inpatient and remained DNR. I suspect she will not be able to safely remain at her apartment for much longer.  - Would benefit from Hospice. Can discuss at next visit. Aim to keep symptoms manageable and optimize functionality for as long as we can.   Difficult situation: severe aortic stenosis and moderate mitral stenosis with pre-existing group 1 severe PH/RV failure as well as  progressive renal failure (suspect cardiorenal syndrome) and chronic GI bleeding from AVMs (?Heyde syndrome).  She is not a candidate for surgical aortic/mitral valve replacement.  With heavily calcified mitral valve (possibly rheumatic), she is unlikely to be a valvuloplasty candidate.  Consideration of TAVR is complicated by the severe PH, which is concerning for anesthesia; chronic anemia which is going to be an issue with post-procedure  antiplatelet therapy; and progressive renal dysfunction (likely cardiorenal) which is going to limit our ability to give her contrast.  She would be a poor HD candidate with severe pulmonary hypertension.  On the flip side, she has been very functional and living alone up to this admission.  Dr. Burt Knack has seen & discussed w/ Dr. Aundra Dubin, do not think she is going to be a good candidate for TAVR for the above reasons.  We will continue to do the best we can with medical management.     Follow up with APP in 2 weeks to follow volume closely and keep appointment with Dr. Aundra Dubin as scheduled.  Allena Katz, FNP-BC 07/29/21

## 2021-07-28 NOTE — Telephone Encounter (Signed)
Nevermind that plan-on review of her chart, renal function appears to be much worse now and considering alternative route so would hold off on new prescriptions at this time.

## 2021-07-29 ENCOUNTER — Telehealth (HOSPITAL_COMMUNITY): Payer: Self-pay | Admitting: *Deleted

## 2021-07-29 ENCOUNTER — Encounter (HOSPITAL_COMMUNITY): Payer: Self-pay

## 2021-07-29 NOTE — Telephone Encounter (Signed)
Verbal order for physical therapy given to [facility] with Alvis Lemmings.   Call back # [phone]

## 2021-07-29 NOTE — Telephone Encounter (Signed)
Office visit note faxed to [facility] as requested.  Fax [phone]

## 2021-08-02 ENCOUNTER — Encounter: Payer: Self-pay | Admitting: Medical

## 2021-08-03 ENCOUNTER — Ambulatory Visit (INDEPENDENT_AMBULATORY_CARE_PROVIDER_SITE_OTHER): Payer: Medicare Other | Admitting: Medical

## 2021-08-03 ENCOUNTER — Other Ambulatory Visit: Payer: Self-pay

## 2021-08-03 VITALS — BP 119/68 | HR 53 | Resp 18 | Ht 65.0 in | Wt 153.0 lb

## 2021-08-03 DIAGNOSIS — D62 Acute posthemorrhagic anemia: Secondary | ICD-10-CM

## 2021-08-03 DIAGNOSIS — N189 Chronic kidney disease, unspecified: Secondary | ICD-10-CM | POA: Diagnosis not present

## 2021-08-03 DIAGNOSIS — R944 Abnormal results of kidney function studies: Secondary | ICD-10-CM

## 2021-08-03 NOTE — Progress Notes (Signed)
Subjective:    Patient ID: Alisha [name], female    DOB: 13-Oct-1940, 80 y.o.   MRN: [id number]  HPI  Pt in for follow up from hospital.  Admitted From: Home Disposition: Home   Recommendations for Outpatient Follow-up:  Follow up with PCP in 1-2 weeks Please obtain BMP/CBC in one week Follow-up with your GI as soon as possible Follow-up with cardiologist in 1 to 2 weeks Please follow up with your PCP on the following pending results:   Subjective: Seen and examined.  She states that she is feeling a lot better.  She is on 2 L of oxygen.  No shortness of breath.  She was walking in the halls today.   Brief/Interim Summary: Alisha [name] is an 80 y.o. 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  She relates that she had her diuretic recently increased as she was becoming short of breath and requiring oxygen.  Upon arrival to the ED she was satting in the low 90s blood pressure 117/58 bicarb of 13 creatinine of 4 with a BUN of 97 (with a baseline creatinine of 1.3-1.5).  She was admitted initially with acute renal failure superimposed on CKD stage IV.  Further hospitalization course as below.   Acute renal failure superimposed on stage 4 chronic kidney disease/anion gap metabolic acidosis: Baseline creatinine seems to be around 1.8-1.9.  Presented with 4.3. Likely prerenal history  in the setting of torsemide and metolazone use. This was held she was given gentle IV fluid hydration.  Creatinine improved to 3.4, unfortunately she developed pulmonary edema seen on chest x-ray and BNP of 1700. Cardiology was consulted recommended right heart cath with results as below.  She received bicarb tablets.  Acidosis resolved.  Creatinine improved up to 3.4 but has remained stable at that level for  last several days making it new baseline for her.  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   Hypokalemia: 3.2 again today.  Will replace before discharge.  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  Echo this admission with moderate-severe aortic stenosis (mean gradient 34 mmHg with AVA around 1.0 cm^2) and moderate-severe mitral stenosis (mean gradient 10 mmHg). Unfortunately right heart cath showed elevated right and left filling pressures with preserved cardiac output.  She was on IV Lasix 80 mg twice daily.  TEE completed 07/21/2021 confirmed moderate mitral stenosis but severe aortic stenosis.  Structural heart team evaluated her and she was deemed not a candidate for TAVR due to being high risk candidate due to her severe pulmonary hypertension and significantly elevated creatinine.  Cardiology has switched her to oral diuretics now.  They recommend following medications Torsemide 60 mg BID -Metolazone once weekly on Saturday (do not take until 11/19) -KCL 20 mEq daily + extra 20 mEq with metolazone -Adempas 2.5 mg TID -Uptravi 2400 mg BID -Metoprolol xl 25 mg BID  Normocytic chronic blood loss anemia in the setting of chronic GI blood loss: She has a history of AVMs.  She received 1 packed red blood cells on 07/15/2021. Her hemoglobin drifted down to 6.8 07/21/2021, received another unit of PRBC transfusion.  Seen by GI.  Reportedly she had push enteroscopy about  a month ago which was negative.  After detailed and extensive discussion with the patient and family and GI, they mutually decided not to do any other procedures and that patient will follow up with her GI.  She was started on subcutaneous octreotide twice daily here.  She will receive long-acting octreotide subcutaneously here which will last  for 30 days and she will follow-up with her primary GI and they will initiate prior authorization for subcutaneous octreotide.  Her hemoglobin has remained stable since last 2 days.  No further melena history.     Essential hypertension: Blood pressure labile.  Home dose of amlodipine was held and is being discontinued at discharge, but continue Toprol-XL.   Prolonged QTC: Stable.   Goal of care/placement: She was seen by palliative care.  At 1 point in time, she was leaning towards hospice if no definitive treatment for her aortic stenosis such as TAVR but later on, she changed her mind.  She was living in assisted living facility.  They are not offering her bed anymore due to her medications being expensive.  She is now being discharged at her daughter's home and eventually will transition to her own apartment.  Discharge Diagnoses:  Principal Problem:   Acute renal failure superimposed on stage 4 chronic kidney disease (HCC) Active Problems:   Pulmonary hypertension, unspecified (HCC)   CAD (coronary artery disease), native coronary artery   Chronic diastolic heart failure (HCC)   AVM (arteriovenous malformation) of small bowel, acquired   Prolonged QT interval  Pt is doing overall well. PT thru [facility]. Coming out 3 times a week.     Cardiology note below.  "Difficult situation: severe aortic stenosis and moderate mitral stenosis with pre-existing group 1 severe PH/RV failure as well as progressive renal failure (suspect cardiorenal syndrome) and chronic GI bleeding from AVMs (?Heyde syndrome).  She is not a candidate for surgical aortic/mitral valve replacement.  With heavily calcified mitral valve (possibly rheumatic), she is unlikely to be a valvuloplasty candidate.  Consideration of TAVR is complicated by the severe PH, which is concerning for anesthesia; chronic anemia which is going to be an issue with post-procedure antiplatelet therapy; and progressive renal dysfunction (likely  cardiorenal) which is going to limit our ability to give her contrast.  She would be a poor HD candidate with severe pulmonary hypertension.  On the flip side, she has been very functional and living alone up to this admission.  Dr. Burt Knack has seen & discussed w/ Dr. Aundra Dubin, do not think she is going to be a good candidate for TAVR for the above reasons.  We will continue to do the best we can with medical management"     Pt saw GI MD. Plan below.  1. AVM (arteriovenous malformation) of small bowel, acquired octreotide acetate (SANDOSTATIN LAR) 30 mg injection   2. Chronic diarrhea octreotide acetate (SANDOSTATIN LAR) 30 mg injection   3. Severe pulmonary hypertension (*) octreotide acetate (SANDOSTATIN LAR) 30 mg injection   4. Cachexia (*) CBC  Ferritin  Iron Panel 2  Vitamin B12   5. Other general symptoms and signs Vitamin B12   6. Acute blood loss anemia   7. Iron deficiency anemia due to chronic blood loss   Pt was considering going to [facility] but was turned down. [place] for skilled nursing.   Pt is at [facility].   Pt is urinating presently daily. Pt has not been scheduled for nephrologist.   Review of Systems  Constitutional:  Positive  for fatigue. Negative for chills and fever.  HENT:  Negative for congestion, drooling and ear discharge.   Respiratory:  Negative for cough, chest tightness, shortness of breath and wheezing.   Cardiovascular:  Negative for chest pain and palpitations.  Gastrointestinal:  Negative for abdominal distention, abdominal pain, diarrhea, nausea and vomiting.  Genitourinary:  Negative for dysuria.  Musculoskeletal:  Negative for back pain, joint swelling and neck pain.  Skin:  Negative for rash.  Neurological:  Negative for dizziness, light-headedness and numbness.  Hematological:  Negative for adenopathy. Does not bruise/bleed easily.  Psychiatric/Behavioral:  Negative for behavioral problems and confusion.     Past Medical History:   Diagnosis Date   Aortic atherosclerosis ([place]) 08/30/2017   Aortic stenosis 08/30/2017   CAD (coronary artery disease), native coronary artery 08/30/2017   CHF (congestive heart failure) (HCC)    Chronic diastolic heart failure ([place]) 08/30/2017   Diabetes mellitus with peripheral vascular disease ([place]) 07/14/2014   Hypertensive heart disease without CHF 08/30/2017   Hypothyroidism (acquired) 08/30/2017   Pulmonary hypertension ([place]) 07/04/2017     Social History   Socioeconomic History   Marital status: Divorced    Spouse name: Not on file   Number of children: Not on file   Years of education: Not on file   Highest education level: Not on file  Occupational History   Not on file  Tobacco Use   Smoking status: Never   Smokeless tobacco: Never  Vaping Use   Vaping Use: Never used  Substance and Sexual Activity   Alcohol use: Yes    Comment: Occasionally   Drug use: No   Sexual activity: Never  Other Topics Concern   Not on file  Social History Narrative   Not on file   Social Determinants of Health   Financial Resource Strain: Not on file  Food Insecurity: Not on file  Transportation Needs: Not on file  Physical Activity: Not on file  Stress: Not on file  Social Connections: Not on file  Intimate Partner Violence: Not on file    Past Surgical History:  Procedure Laterality Date   [place] CATH N/A 01/01/2019   Procedure: RIGHT HEART CATH;  Surgeon: Larey Dresser, MD;  Location: [facility];  Service: Cardiovascular;  Laterality: N/A;   RIGHT HEART CATH N/A 07/19/2021   Procedure: RIGHT HEART CATH;  Surgeon: Larey Dresser, MD;  Location: [facility];  Service: Cardiovascular;  Laterality: N/A;   RIGHT/LEFT HEART CATH AND CORONARY ANGIOGRAPHY N/A 09/27/2017   Procedure: RIGHT/LEFT HEART CATH AND CORONARY ANGIOGRAPHY;  Surgeon: Sherren Mocha, MD;  Location: [facility]  [facility] LAB;  Service: Cardiovascular;  Laterality: N/A;   TEE WITHOUT CARDIOVERSION N/A 01/07/2021   Procedure: TRANSESOPHAGEAL ECHOCARDIOGRAM (TEE);  Surgeon: Larey Dresser, MD;  Location: [facility] ENDOSCOPY;  Service: Cardiovascular;  Laterality: N/A;   TEE WITHOUT CARDIOVERSION N/A 07/21/2021   Procedure: TRANSESOPHAGEAL ECHOCARDIOGRAM (TEE);  Surgeon: Larey Dresser, MD;  Location: [facility] ENDOSCOPY;  Service: Cardiovascular;  Laterality: N/A;    Family History  Problem Relation Age of Onset   Diabetes Mother    Bleeding Disorder Mother    Heart disease Father    Alzheimer's disease Father    Diabetes Sister    Diabetes Brother    Heart disease Brother    Healthy Daughter    Healthy Son  Allergies  Allergen Reactions   Other Palpitations    Surgical metal (stents, pins, plates) keeps pt from healing   Adhesive [Tape] Other (See Comments)    Irritates skin with rash   Macitentan Other (See Comments)    Lower extremity edema    Nickel Rash    Current Outpatient Medications on File Prior to Visit  Medication Sig Dispense Refill   ADEMPAS 2.5 MG TABS TAKE 1 TABLET THREE TIMES A DAY (Patient taking differently: Take 2.5 mg by mouth 3 (three) times daily.) 90 tablet 11   atorvastatin (LIPITOR) 20 MG tablet TAKE 1 TABLET BY MOUTH IN  THE MORNING 90 tablet 3   cetirizine (ZYRTEC) 10 MG tablet Take 10 mg by mouth daily as needed for allergies.     cholecalciferol (VITAMIN D) 25 MCG (1000 UNIT) tablet Take 1,000 Units by mouth daily.     fenofibrate 160 MG tablet Take 1 tablet (160 mg total) by mouth every morning. 30 tablet 2   ferrous sulfate 325 (65 FE) MG tablet Take 1 tablet (325 mg total) by mouth daily with breakfast. Morning and night 30 tablet 0   glucose blood (ACCU-CHEK AVIVA PLUS) test strip Check blood sugar TID dx: E11.9 100 each 12   levothyroxine (SYNTHROID) 125 MCG tablet Take 1 tablet (125 mcg total) by mouth daily before breakfast. 30 tablet 2   loperamide (IMODIUM A-D) 2  MG tablet Take 2 mg by mouth daily as needed for diarrhea or loose stools.     loratadine (CLARITIN) 10 MG tablet Take 10 mg by mouth daily as needed for allergies.      methylPREDNISolone (MEDROL) 4 MG tablet Standard 6 day taper dose. 21 tablet 0   metolazone (ZAROXOLYN) 2.5 MG tablet Take 1 tablet (2.5 mg total) by mouth once a week. Take potassium 34meq with this 4 tablet 0   metoprolol succinate (TOPROL XL) 25 MG 24 hr tablet Take 1 tablet (25 mg total) by mouth 2 (two) times daily. 60 tablet 11   Multiple Vitamins-Minerals (PRESERVISION AREDS 2 PO) Take 1 capsule by mouth 2 (two) times daily.     pantoprazole (PROTONIX) 40 MG tablet TAKE 1 TABLET BY MOUTH  TWICE DAILY 180 tablet 3   potassium chloride (KLOR-CON) 10 MEQ tablet Take 2 tablets (20 mEq total) by mouth daily. Take an extra 13meq with metolazone 60 tablet 0   torsemide 60 MG TABS Take 60 mg by mouth 2 (two) times daily. 30 tablet 0   UPTRAVI 1600 MCG TABS Take 1 tablet (1,600 mcg total) by mouth 2 (two) times daily. Take with 800 for a total of 2400 mg 60 tablet 11   UPTRAVI 800 MCG TABS Take 1 tablet (800 mcg total) by mouth 2 (two) times daily. Take with 1600 mcg for a total of 2400 mg 60 tablet 11   No current facility-administered medications on file prior to visit.    BP 119/68   Pulse (!) 53   Resp 18   Ht 5\' 5"  (1.651 m)   Wt 153 lb (69.4 kg)   SpO2 99%   BMI 25.46 kg/m       Objective:   Physical Exam  General Mental Status- Alert. General Appearance- Not in acute distress.   Skin General: Color- Normal Color. Moisture- Normal Moisture.  Neck Carotid Arteries- Normal color. Moisture- Normal Moisture. No carotid bruits. No JVD.  Chest and Lung Exam Auscultation: Breath Sounds:-Normal.  Cardiovascular Auscultation:Rythm- Regular. Murmurs & Other Heart Sounds:Auscultation of  the heart reveals- No Murmurs.  Abdomen Inspection:-Inspeection Normal. Palpation/Percussion:Note:No mass. Palpation and  Percussion of the abdomen reveal- Non Tender, Non Distended + BS, no rebound or guarding.    Neurologic Cranial Nerve exam:- CN III-XII intact(No nystagmus), symmetric smile. Strength:- 5/5 equal and symmetric strength both upper and lower extremities.   Lower extremity-calf symmetric, no pedal edema.  Negative Homans' sign.      Assessment & Plan:   Patient Instructions    Pulmonary hypertension, CAD (coronary artery disease), native coronary artery,Chronic diastolic heart failure ([place]), AVM (arteriovenous malformation) of small bowel,  ckd, hypertension, severe anemia Referred for Cardiorenal Syndrome when you were hospitalized.  GI MD and cardiologist most recent notes reviewed.  Labs reviewed and are clinically stable.  Decided not to repeat CBC and CMP today since labs done recently on the 16th.  Upcoming repeat labs next week with specialist.  If your signs symptoms worsen or change during the interim let me know.  Continue current medications.  Glad to see that you have made significant recovery.  .FL 2 form designating a skilled nursing facility.  Placed referral in light of recent hospitalization and acute kidney failure.  During the interim if you stop urinating again please let us know.  Follow-up in 1 month or sooner if needed.   Mackie Pai, PA-C   Time spent with patient today was 40  minutes which consisted of chart revdiew, discussing diagnosis, work up treatment and documentation.

## 2021-08-03 NOTE — Patient Instructions (Signed)
Pulmonary hypertension, CAD (coronary artery disease), native coronary artery,Chronic diastolic heart failure ([place]), AVM (arteriovenous malformation) of small bowel,  ckd, hypertension, severe anemia Referred for Cardiorenal Syndrome when you were hospitalized.  GI MD and cardiologist most recent notes reviewed.  Labs reviewed and are clinically stable.  Decided not to repeat CBC and CMP today since labs done recently on the 16th.  Upcoming repeat labs next week with specialist.  If your signs symptoms worsen or change during the interim let me know.  Continue current medications.  Glad to see that you have made significant recovery.  .FL 2 form designating a skilled nursing facility.  Placed referral in light of recent hospitalization and acute kidney failure.  During the interim if you stop urinating again please let us know.  Follow-up in 1 month or sooner if needed.

## 2021-08-09 ENCOUNTER — Encounter (HOSPITAL_COMMUNITY): Payer: Self-pay | Admitting: Cardiology

## 2021-08-09 ENCOUNTER — Telehealth: Payer: Self-pay | Admitting: Medical

## 2021-08-09 NOTE — Telephone Encounter (Signed)
Pt also contacted cardiology as well

## 2021-08-09 NOTE — Telephone Encounter (Signed)
Pt called to inform pcp that she has not been urinating regularly and has gained 3 pounds.   Transferred pt to Triage regarding symptoms.

## 2021-08-09 NOTE — Telephone Encounter (Signed)
Nurse Assessment Nurse: Ysidro Evert, RN, Levada Dy Date/Time Eilene Ghazi Time): 08/09/2021 3:20:29 PM Confirm and document reason for call. If symptomatic, describe symptoms. ---Caller states she is on oxygen but has shortness of breath when she gets up and moves around. When she was was seen by the doctor a couple days ago she was told to call if she was gaining weight. She has gained 3 pounds and she is having trouble urinating Does the patient have any new or worsening symptoms? ---Yes Will a triage be completed? ---Yes Related visit to physician within the last 2 weeks? ---No Does the PT have any chronic conditions? (i.e. diabetes, asthma, this includes High risk factors for pregnancy, etc.) ---Yes List chronic conditions. ---congestive heart failure, diabetes Is this a behavioral health or substance abuse call? ---No Guidelines Guideline Title Affirmed Question Affirmed Notes Nurse Date/Time (Eastern Time) Heart Failure PostHospitalization Follow-up Call MODERATE difficulty breathing (e.g., speaks in phrases, SOB even at rest, pulse 100-120) Ysidro Evert, RN, Levada Dy 08/09/2021 3:23:16 PM PLEASE NOTE: All timestamps contained within this report are represented as [place] Standard Time. CONFIDENTIALTY NOTICE: This fax transmission is intended only for the addressee. It contains information that is legally privileged, confidential or otherwise protected from use or disclosure. If you are not the intended recipient, you are strictly prohibited from reviewing, disclosing, copying using or disseminating any of this information or taking any action in reliance on or regarding this information. If you have received this fax in error, please notify us immediately by telephone so that we can arrange for its return to [place]. Phone: [phone], Toll-Free: [phone], Fax: [phone] Page: 2 of 2 Call Id: [id number] [place]. Time Eilene Ghazi Time) Disposition Final User 08/09/2021 3:18:50 PM Send to Urgent  Marina Goodell, [place] 08/09/2021 3:28:02 PM Go to ED Now (or PCP triage) Yes Ysidro Evert, RN, [place] Shutter Disagree/Comply Comply Caller Understands Yes PreDisposition Did not know what to do Care Advice Given Per Guideline GO TO ED NOW (OR PCP TRIAGE): * IF NO PCP (PRIMARY CARE PROVIDER) SECOND-LEVEL TRIAGE: You need to be seen within the next hour. Go to the [facility] at _____________ [place] as soon as you can. CARE ADVICE given per Heart Failure Post-Hospitalization Follow-Up Call (Adult) guideline. Referrals [facility] - ED

## 2021-08-10 ENCOUNTER — Inpatient Hospital Stay (HOSPITAL_COMMUNITY)
Admission: EM | Admit: 2021-08-10 | Discharge: 2021-08-12 | DRG: 291 | Disposition: A | Discharge status: Hospice | Payer: Medicare Other | Attending: Internal Medicine | Admitting: Internal Medicine

## 2021-08-10 ENCOUNTER — Other Ambulatory Visit: Payer: Self-pay

## 2021-08-10 ENCOUNTER — Emergency Department (HOSPITAL_COMMUNITY): Payer: Medicare Other

## 2021-08-10 ENCOUNTER — Encounter (HOSPITAL_COMMUNITY): Payer: Self-pay | Admitting: Emergency Medicine

## 2021-08-10 DIAGNOSIS — D500 Iron deficiency anemia secondary to blood loss (chronic): Secondary | ICD-10-CM | POA: Diagnosis present

## 2021-08-10 DIAGNOSIS — Z20822 Contact with and (suspected) exposure to covid-19: Secondary | ICD-10-CM | POA: Diagnosis present

## 2021-08-10 DIAGNOSIS — N179 Acute kidney failure, unspecified: Secondary | ICD-10-CM | POA: Diagnosis present

## 2021-08-10 DIAGNOSIS — I2510 Atherosclerotic heart disease of native coronary artery without angina pectoris: Secondary | ICD-10-CM | POA: Diagnosis present

## 2021-08-10 DIAGNOSIS — J9621 Acute and chronic respiratory failure with hypoxia: Secondary | ICD-10-CM | POA: Diagnosis present

## 2021-08-10 DIAGNOSIS — E1122 Type 2 diabetes mellitus with diabetic chronic kidney disease: Secondary | ICD-10-CM | POA: Diagnosis present

## 2021-08-10 DIAGNOSIS — E8770 Fluid overload, unspecified: Secondary | ICD-10-CM

## 2021-08-10 DIAGNOSIS — Z8616 Personal history of COVID-19: Secondary | ICD-10-CM

## 2021-08-10 DIAGNOSIS — Z86718 Personal history of other venous thrombosis and embolism: Secondary | ICD-10-CM

## 2021-08-10 DIAGNOSIS — E872 Acidosis, unspecified: Secondary | ICD-10-CM | POA: Diagnosis present

## 2021-08-10 DIAGNOSIS — G4733 Obstructive sleep apnea (adult) (pediatric): Secondary | ICD-10-CM | POA: Diagnosis present

## 2021-08-10 DIAGNOSIS — Z66 Do not resuscitate: Secondary | ICD-10-CM | POA: Diagnosis present

## 2021-08-10 DIAGNOSIS — J9600 Acute respiratory failure, unspecified whether with hypoxia or hypercapnia: Secondary | ICD-10-CM

## 2021-08-10 DIAGNOSIS — Z832 Family history of diseases of the blood and blood-forming organs and certain disorders involving the immune mechanism: Secondary | ICD-10-CM

## 2021-08-10 DIAGNOSIS — J9811 Atelectasis: Secondary | ICD-10-CM | POA: Diagnosis not present

## 2021-08-10 DIAGNOSIS — I5084 End stage heart failure: Secondary | ICD-10-CM | POA: Diagnosis present

## 2021-08-10 DIAGNOSIS — N184 Chronic kidney disease, stage 4 (severe): Secondary | ICD-10-CM | POA: Diagnosis present

## 2021-08-10 DIAGNOSIS — E039 Hypothyroidism, unspecified: Secondary | ICD-10-CM | POA: Diagnosis present

## 2021-08-10 DIAGNOSIS — Z79899 Other long term (current) drug therapy: Secondary | ICD-10-CM

## 2021-08-10 DIAGNOSIS — E785 Hyperlipidemia, unspecified: Secondary | ICD-10-CM | POA: Diagnosis present

## 2021-08-10 DIAGNOSIS — E875 Hyperkalemia: Secondary | ICD-10-CM | POA: Diagnosis present

## 2021-08-10 DIAGNOSIS — I130 Hypertensive heart and chronic kidney disease with heart failure and stage 1 through stage 4 chronic kidney disease, or unspecified chronic kidney disease: Secondary | ICD-10-CM | POA: Diagnosis not present

## 2021-08-10 DIAGNOSIS — I2721 Secondary pulmonary arterial hypertension: Secondary | ICD-10-CM | POA: Diagnosis present

## 2021-08-10 DIAGNOSIS — E1151 Type 2 diabetes mellitus with diabetic peripheral angiopathy without gangrene: Secondary | ICD-10-CM | POA: Diagnosis present

## 2021-08-10 DIAGNOSIS — Z820 Family history of epilepsy and other diseases of the nervous system: Secondary | ICD-10-CM

## 2021-08-10 DIAGNOSIS — Z8249 Family history of ischemic heart disease and other diseases of the circulatory system: Secondary | ICD-10-CM

## 2021-08-10 DIAGNOSIS — R0602 Shortness of breath: Secondary | ICD-10-CM

## 2021-08-10 DIAGNOSIS — I5033 Acute on chronic diastolic (congestive) heart failure: Secondary | ICD-10-CM | POA: Diagnosis present

## 2021-08-10 DIAGNOSIS — Z833 Family history of diabetes mellitus: Secondary | ICD-10-CM

## 2021-08-10 DIAGNOSIS — I700 Atherosclerosis of aorta: Secondary | ICD-10-CM | POA: Diagnosis present

## 2021-08-10 DIAGNOSIS — K921 Melena: Secondary | ICD-10-CM | POA: Diagnosis present

## 2021-08-10 DIAGNOSIS — Z888 Allergy status to other drugs, medicaments and biological substances status: Secondary | ICD-10-CM

## 2021-08-10 DIAGNOSIS — Z515 Encounter for palliative care: Secondary | ICD-10-CM

## 2021-08-10 DIAGNOSIS — I3139 Other pericardial effusion (noninflammatory): Secondary | ICD-10-CM | POA: Diagnosis present

## 2021-08-10 DIAGNOSIS — Z79890 Hormone replacement therapy (postmenopausal): Secondary | ICD-10-CM

## 2021-08-10 DIAGNOSIS — I080 Rheumatic disorders of both mitral and aortic valves: Secondary | ICD-10-CM | POA: Diagnosis present

## 2021-08-10 LAB — COMPREHENSIVE METABOLIC PANEL
ALT: 6 U/L (ref 0–44)
AST: 10 U/L — ABNORMAL LOW (ref 15–41)
Albumin: 3.3 g/dL — ABNORMAL LOW (ref 3.5–5.0)
Alkaline Phosphatase: 57 U/L (ref 38–126)
Anion gap: 13 (ref 5–15)
BUN: 77 mg/dL — ABNORMAL HIGH (ref 8–23)
CO2: 14 mmol/L — ABNORMAL LOW (ref 22–32)
Calcium: 8.2 mg/dL — ABNORMAL LOW (ref 8.9–10.3)
Chloride: 109 mmol/L (ref 98–111)
Creatinine, Ser: 5.43 mg/dL — ABNORMAL HIGH (ref 0.44–1.00)
GFR, Estimated: 8 mL/min — ABNORMAL LOW (ref >60)
Glucose, Bld: 198 mg/dL — ABNORMAL HIGH (ref 70–99)
Potassium: 5.7 mmol/L — ABNORMAL HIGH (ref 3.5–5.1)
Sodium: 136 mmol/L (ref 135–145)
Total Bilirubin: 0.6 mg/dL (ref 0.3–1.2)
Total Protein: 6.4 g/dL — ABNORMAL LOW (ref 6.5–8.1)

## 2021-08-10 LAB — TYPE AND SCREEN
ABO/RH(D): A POS
Antibody Screen: NEGATIVE

## 2021-08-10 LAB — CBC
HCT: 30.6 % — ABNORMAL LOW (ref 36.0–46.0)
Hemoglobin: 8.9 g/dL — ABNORMAL LOW (ref 12.0–15.0)
MCH: 26 pg (ref 26.0–34.0)
MCHC: 29.1 g/dL — ABNORMAL LOW (ref 30.0–36.0)
MCV: 89.5 fL (ref 80.0–100.0)
Platelets: 412 10*3/uL — ABNORMAL HIGH (ref 150–400)
RBC: 3.42 MIL/uL — ABNORMAL LOW (ref 3.87–5.11)
RDW: 19 % — ABNORMAL HIGH (ref 11.5–15.5)
WBC: 9.6 10*3/uL (ref 4.0–10.5)
nRBC: 0 % (ref 0.0–0.2)

## 2021-08-10 NOTE — Telephone Encounter (Signed)
Patient called back and stated her oxygen cord had came out but her oxygen level currently is 95

## 2021-08-10 NOTE — ED Triage Notes (Addendum)
Pt to triage via [facility] from [facility] in [employer] ([place]).  Reports dark colored rectal bleeding x 3 days.  C/o SOB since this afternoon.  Wears O2 at home as needed.  Took a nap this afternoon without oxygen and when she got up sats were 88%.  Pt states it feels like when her Hgb is low.    CBG 266

## 2021-08-10 NOTE — Telephone Encounter (Signed)
Pt states she feels better , she went to the bathroom last night , her legs are a little bit swollen , and her current o2 while on oxygen is 84 made her switch fingers and it was still lower than 90, and her breathing is little bit labored if she isnt moving around and also stated she should ER if sx worsen

## 2021-08-11 ENCOUNTER — Encounter (HOSPITAL_COMMUNITY): Payer: Self-pay

## 2021-08-11 ENCOUNTER — Ambulatory Visit (HOSPITAL_COMMUNITY)
Admission: RE | Admit: 2021-08-11 | Discharge: 2021-08-11 | Disposition: A | Discharge status: Home / Self Care | Payer: Medicare Other | Source: Ambulatory Visit | Attending: Family Medicine | Admitting: Family Medicine

## 2021-08-11 DIAGNOSIS — I350 Nonrheumatic aortic (valve) stenosis: Secondary | ICD-10-CM

## 2021-08-11 DIAGNOSIS — E785 Hyperlipidemia, unspecified: Secondary | ICD-10-CM | POA: Diagnosis present

## 2021-08-11 DIAGNOSIS — I5033 Acute on chronic diastolic (congestive) heart failure: Secondary | ICD-10-CM

## 2021-08-11 DIAGNOSIS — E1122 Type 2 diabetes mellitus with diabetic chronic kidney disease: Secondary | ICD-10-CM | POA: Diagnosis present

## 2021-08-11 DIAGNOSIS — Z66 Do not resuscitate: Secondary | ICD-10-CM

## 2021-08-11 DIAGNOSIS — I272 Pulmonary hypertension, unspecified: Secondary | ICD-10-CM | POA: Diagnosis not present

## 2021-08-11 DIAGNOSIS — N179 Acute kidney failure, unspecified: Secondary | ICD-10-CM | POA: Diagnosis present

## 2021-08-11 DIAGNOSIS — D500 Iron deficiency anemia secondary to blood loss (chronic): Secondary | ICD-10-CM | POA: Diagnosis present

## 2021-08-11 DIAGNOSIS — I2510 Atherosclerotic heart disease of native coronary artery without angina pectoris: Secondary | ICD-10-CM | POA: Diagnosis present

## 2021-08-11 DIAGNOSIS — I5032 Chronic diastolic (congestive) heart failure: Secondary | ICD-10-CM | POA: Diagnosis not present

## 2021-08-11 DIAGNOSIS — I342 Nonrheumatic mitral (valve) stenosis: Secondary | ICD-10-CM | POA: Diagnosis not present

## 2021-08-11 DIAGNOSIS — Z515 Encounter for palliative care: Secondary | ICD-10-CM | POA: Diagnosis not present

## 2021-08-11 DIAGNOSIS — I700 Atherosclerosis of aorta: Secondary | ICD-10-CM | POA: Diagnosis present

## 2021-08-11 DIAGNOSIS — I080 Rheumatic disorders of both mitral and aortic valves: Secondary | ICD-10-CM | POA: Diagnosis present

## 2021-08-11 DIAGNOSIS — E872 Acidosis, unspecified: Secondary | ICD-10-CM

## 2021-08-11 DIAGNOSIS — I130 Hypertensive heart and chronic kidney disease with heart failure and stage 1 through stage 4 chronic kidney disease, or unspecified chronic kidney disease: Secondary | ICD-10-CM | POA: Diagnosis present

## 2021-08-11 DIAGNOSIS — E875 Hyperkalemia: Secondary | ICD-10-CM | POA: Diagnosis present

## 2021-08-11 DIAGNOSIS — E039 Hypothyroidism, unspecified: Secondary | ICD-10-CM | POA: Diagnosis present

## 2021-08-11 DIAGNOSIS — Z888 Allergy status to other drugs, medicaments and biological substances status: Secondary | ICD-10-CM | POA: Diagnosis not present

## 2021-08-11 DIAGNOSIS — E1151 Type 2 diabetes mellitus with diabetic peripheral angiopathy without gangrene: Secondary | ICD-10-CM | POA: Diagnosis present

## 2021-08-11 DIAGNOSIS — Z20822 Contact with and (suspected) exposure to covid-19: Secondary | ICD-10-CM | POA: Diagnosis present

## 2021-08-11 DIAGNOSIS — I3139 Other pericardial effusion (noninflammatory): Secondary | ICD-10-CM | POA: Diagnosis present

## 2021-08-11 DIAGNOSIS — N184 Chronic kidney disease, stage 4 (severe): Secondary | ICD-10-CM

## 2021-08-11 DIAGNOSIS — J9621 Acute and chronic respiratory failure with hypoxia: Secondary | ICD-10-CM | POA: Diagnosis present

## 2021-08-11 DIAGNOSIS — Z8616 Personal history of COVID-19: Secondary | ICD-10-CM | POA: Diagnosis not present

## 2021-08-11 DIAGNOSIS — K921 Melena: Secondary | ICD-10-CM | POA: Diagnosis present

## 2021-08-11 DIAGNOSIS — J9811 Atelectasis: Secondary | ICD-10-CM | POA: Diagnosis present

## 2021-08-11 DIAGNOSIS — I5084 End stage heart failure: Secondary | ICD-10-CM | POA: Diagnosis present

## 2021-08-11 DIAGNOSIS — I2721 Secondary pulmonary arterial hypertension: Secondary | ICD-10-CM | POA: Diagnosis present

## 2021-08-11 LAB — BASIC METABOLIC PANEL
Anion gap: 13 (ref 5–15)
BUN: 81 mg/dL — ABNORMAL HIGH (ref 8–23)
CO2: 13 mmol/L — ABNORMAL LOW (ref 22–32)
Calcium: 8.2 mg/dL — ABNORMAL LOW (ref 8.9–10.3)
Chloride: 109 mmol/L (ref 98–111)
Creatinine, Ser: 5.38 mg/dL — ABNORMAL HIGH (ref 0.44–1.00)
GFR, Estimated: 8 mL/min — ABNORMAL LOW (ref >60)
Glucose, Bld: 140 mg/dL — ABNORMAL HIGH (ref 70–99)
Potassium: 5 mmol/L (ref 3.5–5.1)
Sodium: 135 mmol/L (ref 135–145)

## 2021-08-11 LAB — URINALYSIS, ROUTINE W REFLEX MICROSCOPIC
Bilirubin Urine: NEGATIVE
Glucose, UA: NEGATIVE mg/dL
Hgb urine dipstick: NEGATIVE
Ketones, ur: NEGATIVE mg/dL
Nitrite: NEGATIVE
Protein, ur: NEGATIVE mg/dL
Specific Gravity, Urine: 1.025 (ref 1.005–1.030)
pH: 5.5 (ref 5.0–8.0)

## 2021-08-11 LAB — BRAIN NATRIURETIC PEPTIDE: B Natriuretic Peptide: 1634.9 pg/mL — ABNORMAL HIGH (ref 0.0–100.0)

## 2021-08-11 LAB — TROPONIN I (HIGH SENSITIVITY)
Troponin I (High Sensitivity): 17 ng/L (ref <18)
Troponin I (High Sensitivity): 17 ng/L (ref <18)

## 2021-08-11 LAB — CBC
HCT: 31.1 % — ABNORMAL LOW (ref 36.0–46.0)
Hemoglobin: 9.4 g/dL — ABNORMAL LOW (ref 12.0–15.0)
MCH: 26.9 pg (ref 26.0–34.0)
MCHC: 30.2 g/dL (ref 30.0–36.0)
MCV: 88.9 fL (ref 80.0–100.0)
Platelets: 379 10*3/uL (ref 150–400)
RBC: 3.5 MIL/uL — ABNORMAL LOW (ref 3.87–5.11)
RDW: 19 % — ABNORMAL HIGH (ref 11.5–15.5)
WBC: 7.2 10*3/uL (ref 4.0–10.5)
nRBC: 0 % (ref 0.0–0.2)

## 2021-08-11 LAB — URINALYSIS, MICROSCOPIC (REFLEX)

## 2021-08-11 LAB — RESP PANEL BY RT-PCR (FLU A&B, COVID) ARPGX2
Influenza A by PCR: NEGATIVE
Influenza B by PCR: NEGATIVE
SARS Coronavirus 2 by RT PCR: NEGATIVE

## 2021-08-11 LAB — POC OCCULT BLOOD, ED: Fecal Occult Bld: NEGATIVE

## 2021-08-11 MED ORDER — SELEXIPAG 1600 MCG PO TABS
1600.0000 ug | ORAL_TABLET | Freq: Two times a day (BID) | ORAL | Status: DC
  Administered 2021-08-11 – 2021-08-12 (×2): 1600 ug via ORAL
  Filled 2021-08-11 (×4): qty 1

## 2021-08-11 MED ORDER — SODIUM CHLORIDE 0.9% FLUSH
3.0000 mL | Freq: Two times a day (BID) | INTRAVENOUS | Status: DC
  Administered 2021-08-11 (×2): 3 mL via INTRAVENOUS

## 2021-08-11 MED ORDER — ATORVASTATIN CALCIUM 10 MG PO TABS
20.0000 mg | ORAL_TABLET | Freq: Every day | ORAL | Status: DC
  Administered 2021-08-11 – 2021-08-12 (×2): 20 mg via ORAL
  Filled 2021-08-11 (×2): qty 2

## 2021-08-11 MED ORDER — SELEXIPAG 800 MCG PO TABS: 800.0000 ug | ORAL_TABLET | Freq: Two times a day (BID) | ORAL | Status: DC

## 2021-08-11 MED ORDER — LEVOTHYROXINE SODIUM 25 MCG PO TABS
125.0000 ug | ORAL_TABLET | Freq: Every day | ORAL | Status: DC
  Administered 2021-08-12: 125 ug via ORAL
  Filled 2021-08-11: qty 1

## 2021-08-11 MED ORDER — SELEXIPAG 1600 MCG PO TABS: 1600.0000 ug | ORAL_TABLET | ORAL | Status: DC

## 2021-08-11 MED ORDER — SODIUM BICARBONATE 650 MG PO TABS
650.0000 mg | ORAL_TABLET | Freq: Two times a day (BID) | ORAL | Status: DC
  Administered 2021-08-11 – 2021-08-12 (×3): 650 mg via ORAL
  Filled 2021-08-11 (×3): qty 1

## 2021-08-11 MED ORDER — ACETAMINOPHEN 325 MG PO TABS: 650.0000 mg | ORAL_TABLET | Freq: Four times a day (QID) | ORAL | Status: DC | PRN

## 2021-08-11 MED ORDER — SELEXIPAG 800 MCG PO TABS
800.0000 ug | ORAL_TABLET | Freq: Two times a day (BID) | ORAL | Status: DC
  Administered 2021-08-11 – 2021-08-12 (×2): 800 ug via ORAL
  Filled 2021-08-11 (×4): qty 1

## 2021-08-11 MED ORDER — LORATADINE 10 MG PO TABS
10.0000 mg | ORAL_TABLET | Freq: Every day | ORAL | Status: DC | PRN
  Filled 2021-08-11: qty 1

## 2021-08-11 MED ORDER — RIOCIGUAT 2.5 MG PO TABS
2.5000 mg | ORAL_TABLET | Freq: Three times a day (TID) | ORAL | Status: DC
  Administered 2021-08-11 – 2021-08-12 (×2): 2.5 mg via ORAL
  Filled 2021-08-11 (×5): qty 1

## 2021-08-11 MED ORDER — SODIUM ZIRCONIUM CYCLOSILICATE 10 G PO PACK
10.0000 g | PACK | Freq: Once | ORAL | Status: AC
Start: 2021-08-11 — End: 2021-08-11
  Administered 2021-08-11: 10 g via ORAL
  Filled 2021-08-11: qty 1

## 2021-08-11 MED ORDER — DEXTROSE 5 % IV SOLN
15.0000 mg/h | INTRAVENOUS | Status: DC
  Administered 2021-08-11 – 2021-08-12 (×2): 15 mg/h via INTRAVENOUS
  Filled 2021-08-11 (×3): qty 20

## 2021-08-11 MED ORDER — ALBUTEROL SULFATE (2.5 MG/3ML) 0.083% IN NEBU: 2.5000 mg | INHALATION_SOLUTION | Freq: Four times a day (QID) | RESPIRATORY_TRACT | Status: DC | PRN

## 2021-08-11 MED ORDER — ACETAMINOPHEN 650 MG RE SUPP: 650.0000 mg | Freq: Four times a day (QID) | RECTAL | Status: DC | PRN

## 2021-08-11 MED ORDER — FUROSEMIDE 10 MG/ML IJ SOLN
80.0000 mg | Freq: Two times a day (BID) | INTRAMUSCULAR | Status: DC
  Administered 2021-08-11: 80 mg via INTRAVENOUS
  Filled 2021-08-11: qty 8

## 2021-08-11 MED ORDER — FENOFIBRATE 160 MG PO TABS
160.0000 mg | ORAL_TABLET | Freq: Every day | ORAL | Status: DC
  Administered 2021-08-11 – 2021-08-12 (×2): 160 mg via ORAL
  Filled 2021-08-11 (×2): qty 1

## 2021-08-11 MED ORDER — METOLAZONE 5 MG PO TABS
5.0000 mg | ORAL_TABLET | Freq: Once | ORAL | Status: AC
  Administered 2021-08-11: 5 mg via ORAL
  Filled 2021-08-11: qty 1

## 2021-08-11 MED ORDER — PANTOPRAZOLE SODIUM 40 MG PO TBEC
40.0000 mg | DELAYED_RELEASE_TABLET | Freq: Two times a day (BID) | ORAL | Status: DC
  Administered 2021-08-11 – 2021-08-12 (×3): 40 mg via ORAL
  Filled 2021-08-11 (×3): qty 1

## 2021-08-11 NOTE — ED Notes (Signed)
This RN spoke to family, Alisha [name], and case manager about hospice placement. [facility] preferred due to proximity to family. [facility] contacted- placement not available until tomorrow. Family made aware. Charge nurse and security made aware of multiple family members coming to visit. Family and Pastor at bedside at this time.

## 2021-08-11 NOTE — Consult Note (Signed)
Advanced Heart Failure Team Consult Note   Primary Physician: Mackie Pai, PA-C PCP-Cardiologist:  None  Reason for Consultation: End stage CHF  HPI:    Alisha [name] is seen today for evaluation of end stage CHF at the request of Dr. Tamala Julian.   Alisha [name] is a 80 y.o. female who has a history of diabetes, HTN, PAD, hyperlipidemia. She was initially referred by Dr. Wynonia Lawman for evaluation of pulmonary hypertension.    Echo in 4/18 showed preserved EF 65% with moderate pulmonary hypertension.  She had episodes of syncope in 5/18 and 10/18.  She wore an event monitor in 5/18 with no significant arrhythmia.  LHC/RHC in 1/19 showed normal PCWP and severely elevated PA pressure, no significant CAD.     At initial appointment in in the heart failure clinic in 1/19, she was noted to be hypoxemic with exertion and home oxygen was started for use with exertion.  I also started her on Opsumit. She had to stop this after about a week due to significantly increased exertional peripheral edema.  This resolved after Opsumit was stopped.    Echo in 4/19 showed EF 60-65%, mild MS, mild AS, moderately dilated RV with mildly decreased systolic function.    She was found to have Fe deficiency anemia, FOBT+.  EGD showed gastritis and c-scope showed diverticulosis and polyps in 4/19. She get IV Fe.  She is going to have a capsule endoscopy.    She tried to start ambrisentan, but developed worsening dyspnea and peripheral edema and low oxygen saturation.  This improved after ambrisentan was stopped.    She is now taking riociguat and Uptravi.  RHC in 4/20 showed normal filling pressures but severe pulmonary hypertension with PVR 6 WU.  Echo showed moderately dilated RV with normal systolic function, mild-moderate MS, mild AS.  [name] has now been titrated up to 2400 mcg bid and riociguat to 1.5 mg tid.     She had COVID-19 PNA in 2/21, hospitalized for about a week.    Echo in 4/21 showed EF 65-70%,  grade 2 diastolic dysfunction, normal RV, PASP 50 mmHg, probably mild mitral stenosis with mean gradient 7 mmHg and MVA 2 cm^2 by PHT.  Echo in 4/22 showed EF 60-65%, moderate LVH, moderate AS with mean gradient 27 mmHg and AVA 1.06 cm^2, moderate mitral stenosis with mean gradient 7 and MVA 1.2 cm^2, normal RV size and systolic function, PASP 41 mmHg.  TEE was done in 4/22 to more closely assess the mitral and aortic valves, EF 60-65%, d-shaped septum, mild RV enlargement with mildly decreased RV systolic function, peak RV-RA gradient 38 mmHg, moderate AS with AVA 1.31 cm^2 mean gradient 26 mmHg, mild-moderate MR with mild-moderate MS mean gradient 7 with MVA 1.7 cm^2 by VTI .   Returned for follow up 8/22 and was volume overloaded. Lasix stopped and torsemide started 60 mg bid. Amlodipine increased to 10. Instructed to wear oxygen with exertion.  Advised to go to ED after labs showed AKI on CKD IV, SCr up to 4.37. Diuretics held and given gentle IVF. Cardiology consulted to manage subsequent fluid overload. She was diuresed with IV lasix. Echo showed D-shaped septum, moderate RVE and moderate RV dysfunction, also concerning for worsening valvular HD. She underwent RHC showing mild-moderately elevated right and left filling pressures, preserved cardiac output and severe primarily pulmonary arterial hypertension. PVR 4.8 WU. TEE 11/09 with evidence of severe aortic valve stenosis and moderate mitral stenosis (mitral valve appears  rheumatic). Seen by Structural Heart Team and felt not to be candidate for TAVR.  Also thought to be poor candidate for HD.  IV diuretic changed to po torsemide with continuation of weekly metolazone. She was seen by palliative care and is DNR. She was discharged back to her apartment, weight 146 lbs.  Patient has developed progressively worsening exertional dyspnea, now short of breath just walking around her bedroom. +Orthopnea. No appetite, had her birthday party last night prior to  admission and unable to eat anything.  Worsening leg swelling.  No lightheadedness/syncope.  No chest pain. No urination x 2 days despite use of her diuretics.  She came to the ER last night, found to have K 5.7 and creatinine 5.43.  She received Lokelma and a dose of Lasix 80 mg IV x 1.  She has finally made some urine.  Currently comfortable lying in bed.   Review of Systems: All systems reviewed and negative except as per HPI.   Home Medications Prior to Admission medications   Medication Sig Start Date End Date Taking? Authorizing Provider  ADEMPAS 2.5 MG TABS TAKE 1 TABLET THREE TIMES A DAY Patient taking differently: Take 2.5 mg by mouth 3 (three) times daily. 05/24/21  Yes Larey Dresser, MD  atorvastatin (LIPITOR) 20 MG tablet TAKE 1 TABLET BY MOUTH IN  THE MORNING Patient taking differently: Take 20 mg by mouth daily. 01/12/21  Yes Saguier, Percell Miller, PA-C  cetirizine (ZYRTEC) 10 MG tablet Take 10 mg by mouth daily as needed for allergies.   Yes [provider]  cholecalciferol (VITAMIN D) 25 MCG (1000 UNIT) tablet Take 1,000 Units by mouth daily.   Yes [provider]  fenofibrate 160 MG tablet Take 1 tablet (160 mg total) by mouth every morning. 11/17/20  Yes Saguier, Percell Miller, PA-C  levothyroxine (SYNTHROID) 125 MCG tablet Take 1 tablet (125 mcg total) by mouth daily before breakfast. 11/17/20 05/26/22 Yes Saguier, Percell Miller, PA-C  loperamide (IMODIUM A-D) 2 MG tablet Take 2 mg by mouth daily as needed for diarrhea or loose stools.   Yes [provider]  loratadine (CLARITIN) 10 MG tablet Take 10 mg by mouth daily as needed for allergies.    Yes [provider]  metFORMIN (GLUCOPHAGE) 500 MG tablet Take 500 mg by mouth 2 (two) times daily with a meal.   Yes [provider]  metolazone (ZAROXOLYN) 2.5 MG tablet Take 1 tablet (2.5 mg total) by mouth once a week. Take potassium 25meq with this Patient taking differently: Take 2.5 mg by mouth once a week.  Take potassium 30meq with this; takes on saturdays 07/31/21  Yes Pahwani, Einar Grad, MD  metoprolol succinate (TOPROL XL) 25 MG 24 hr tablet Take 1 tablet (25 mg total) by mouth 2 (two) times daily. 11/17/20 11/17/21 Yes Saguier, Percell Miller, PA-C  Multiple Vitamins-Minerals (PRESERVISION AREDS 2 PO) Take 1 capsule by mouth 2 (two) times daily.   Yes [provider]  pantoprazole (PROTONIX) 40 MG tablet TAKE 1 TABLET BY MOUTH  TWICE DAILY Patient taking differently: 40 mg 2 (two) times daily. 04/07/21  Yes Saguier, Percell Miller, PA-C  potassium chloride (KLOR-CON) 10 MEQ tablet Take 2 tablets (20 mEq total) by mouth daily. Take an extra 60meq with metolazone 07/24/21 08/23/21 Yes Pahwani, Einar Grad, MD  torsemide (DEMADEX) 20 MG tablet Take 80 mg by mouth 2 (two) times daily.   Yes [provider]  UPTRAVI 1600 MCG TABS Take 1 tablet (1,600 mcg total) by mouth 2 (two) times daily.  Take with 800 for a total of 2400 mg Patient taking differently: Take 1,600 mcg by mouth See admin instructions. Take with 800 for a total of 2400 mg twice daily 01/22/21  Yes Larey Dresser, MD  UPTRAVI 800 MCG TABS Take 1 tablet (800 mcg total) by mouth 2 (two) times daily. Take with 1600 mcg for a total of 2400 mg Patient taking differently: Take 800 mcg by mouth 2 (two) times daily. Take with 1600 mcg for a total of 2400 mg twice daily 01/22/21  Yes Larey Dresser, MD  ferrous sulfate 325 (65 FE) MG EC tablet Take 325 mg by mouth daily. Patient not taking: Reported on 08/11/2021    [provider]  glucose blood (ACCU-CHEK AVIVA PLUS) test strip Check blood sugar TID dx: E11.9 04/07/21   Saguier, Percell Miller, PA-C    Past Medical History: Past Medical History:  Diagnosis Date   Aortic atherosclerosis ([place]) 08/30/2017   Aortic stenosis 08/30/2017   CAD (coronary artery disease), native coronary artery 08/30/2017   CHF (congestive heart failure) (HCC)    Chronic diastolic heart failure ([place]) 08/30/2017   Diabetes  mellitus with peripheral vascular disease ([place]) 07/14/2014   Hypertensive heart disease without CHF 08/30/2017   Hypothyroidism (acquired) 08/30/2017   Pulmonary hypertension ([place]) 07/04/2017    Past Surgical History: Past Surgical History:  Procedure Laterality Date   ANKLE SURGERY     APPENDECTOMY     DILATION AND CURETTAGE OF UTERUS     RIGHT HEART CATH N/A 01/01/2019   Procedure: RIGHT HEART CATH;  Surgeon: Larey Dresser, MD;  Location: [facility];  Service: Cardiovascular;  Laterality: N/A;   RIGHT HEART CATH N/A 07/19/2021   Procedure: RIGHT HEART CATH;  Surgeon: Larey Dresser, MD;  Location: [place];  Service: Cardiovascular;  Laterality: N/A;   RIGHT/LEFT HEART CATH AND CORONARY ANGIOGRAPHY N/A 09/27/2017   Procedure: RIGHT/LEFT HEART CATH AND CORONARY ANGIOGRAPHY;  Surgeon: Sherren Mocha, MD;  Location: [facility];  Service: Cardiovascular;  Laterality: N/A;   TEE WITHOUT CARDIOVERSION N/A 01/07/2021   Procedure: TRANSESOPHAGEAL ECHOCARDIOGRAM (TEE);  Surgeon: Larey Dresser, MD;  Location: [facility] END[facility];  Service: Cardiovascular;  Laterality: N/A;   TEE WITHOUT CARDIOVERSION N/A 07/21/2021   Procedure: TRANSESOPHAGEAL ECHOCARDIOGRAM (TEE);  Surgeon: Larey Dresser, MD;  Location: [facility] ENDOSCOPY;  Service: Cardiovascular;  Laterality: N/A;    Family History: Family History  Problem Relation Age of Onset   Diabetes Mother    Bleeding Disorder Mother    Heart disease Father    Alzheimer's disease Father    Diabetes Sister    Diabetes Brother    Heart disease Brother    Healthy Daughter    Healthy Son     Social History: Social History   Socioeconomic History   Marital status: Divorced    Spouse name: Not on file   Number of children: Not on file   Years of education: Not on file   Highest education level: Not on file  Occupational History   Not on file  Tobacco Use   Smoking status: Never   Smokeless tobacco: Never  Vaping Use    Vaping Use: Never used  Substance and Sexual Activity   Alcohol use: Yes    Comment: Occasionally   Drug use: No   Sexual activity: Never  Other Topics Concern   Not on file  Social History Narrative   Not on file   Social Determinants of  Health   Financial Resource Strain: Not on file  Food Insecurity: Not on file  Transportation Needs: Not on file  Physical Activity: Not on file  Stress: Not on file  Social Connections: Not on file    Allergies:  Allergies  Allergen Reactions   Other Palpitations    Surgical metal (stents, pins, plates) keeps pt from healing   Adhesive [Tape] Other (See Comments)    Irritates skin with rash   Macitentan Other (See Comments)    Lower extremity edema    Nickel Rash    Objective:    Vital Signs:   Temp:  [97.6 F (36.4 C)-97.9 F (36.6 C)] 97.6 F (36.4 C) (11/30 0053) Pulse Rate:  [61-72] 72 (11/30 1145) Resp:  [12-23] 23 (11/30 1145) BP: (97-118)/(48-54) 116/51 (11/30 1145) SpO2:  [91 %-100 %] 93 % (11/30 1145)    Weight change: There were no vitals filed for this visit.  Intake/Output:  No intake or output data in the 24 hours ending 08/11/21 1250    Physical Exam    General:  Well appearing. No resp difficulty HEENT: normal Neck: supple. JVP 16 cm. Carotids 2+ bilat; no bruits. No lymphadenopathy or thyromegaly appreciated. Cor: PMI nondisplaced. Regular rate & rhythm. No rubs, gallops or murmurs. Lungs: clear Abdomen: soft, nontender, nondistended. No hepatosplenomegaly. No bruits or masses. Good bowel sounds. Extremities: no cyanosis, clubbing, rash. 2+ edema to knees.  Neuro: alert & orientedx3, cranial nerves grossly intact. moves all 4 extremities w/o difficulty. Affect pleasant   Telemetry   NSR 70s (personally reviewed)  EKG    NSR, right axis deviation, RVH   Labs   Basic Metabolic Panel: Recent Labs  Lab 08/10/21 1814 08/11/21 0830  NA 136 135  K 5.7* 5.0  CL 109 109  CO2 14* 13*   GLUCOSE 198* 140*  BUN 77* 81*  CREATININE 5.43* 5.38*  CALCIUM 8.2* 8.2*    Liver Function Tests: Recent Labs  Lab 08/10/21 1814  AST 10*  ALT 6  ALKPHOS 57  BILITOT 0.6  PROT 6.4*  ALBUMIN 3.3*   No results for input(s): LIPASE, AMYLASE in the last 168 hours. No results for input(s): AMMONIA in the last 168 hours.  CBC: Recent Labs  Lab 08/10/21 1814 08/11/21 0830  WBC 9.6 7.2  HGB 8.9* 9.4*  HCT 30.6* 31.1*  MCV 89.5 88.9  PLT 412* 379    Cardiac Enzymes: No results for input(s): CKTOTAL, CKMB, CKMBINDEX, TROPONINI in the last 168 hours.  BNP: BNP (last 3 results) Recent Labs    07/17/21 0336 07/19/21 0103 08/11/21 0830  BNP 1,247.8* 1,758.6* 1,634.9*    ProBNP (last 3 results) Recent Labs    01/26/21 1313  PROBNP 823.0*     CBG: No results for input(s): GLUCAP in the last 168 hours.  Coagulation Studies: No results for input(s): LABPROT, INR in the last 72 hours.   Imaging   DG Chest 2 View  Result Date: 08/10/2021 CLINICAL DATA:  Shortness of breath EXAM: CHEST - 2 VIEW COMPARISON:  07/18/2021, CT 02/17/2021 FINDINGS: Cardiomegaly with vascular congestion, pulmonary edema and small right greater than left pleural effusions. Airspace disease at right base likely atelectasis. IMPRESSION: Cardiomegaly with vascular congestion, edema and small right greater than left pleural effusions. Electronically Signed   By: Donavan Foil M.D.   On: 08/10/2021 19:28     Medications:     Current Medications:  atorvastatin  20 mg Oral Daily   fenofibrate  160 mg  Oral Daily   [START ON 08/12/2021] levothyroxine  125 mcg Oral QAC breakfast   metolazone  5 mg Oral Once   pantoprazole  40 mg Oral BID   Riociguat  2.5 mg Oral TID   Selexipag  1,600 mcg Oral See admin instructions   Selexipag  800 mcg Oral BID   sodium bicarbonate  650 mg Oral BID   sodium chloride flush  3 mL Intravenous Q12H    Infusions:  furosemide (LASIX) 200 mg in dextrose 5%  100 mL (2mg /mL) infusion       Assessment/Plan   1. Pulmonary hypertension with RV failure:  Severe PAH with PVR 7 WU on 1/19 RHC.  Etiology uncertain.  She has a history of DVT remotely but V/Q scan in 2/19 did not show evidence for chronic PE. She has OSA and uses CPAP.  Serologic workup showed only positive ANA but when additional serologies were done, they were all negative.  CT chest with no ILD or emphysema.  Valvular heart disease does not appear significant enough to cause this degree of PH.  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  She did not tolerate Opsumit or ambrisentan due to significant lower extremity edema and worsening dyspnea.  [place] in 4/20 showed severe pulmonary hypertension with normal filling pressures and preserved cardiac output.  Echo in 4/21 showed normal RV with PASP 50 mmHg, echo 4/22 showed normal RV with PASP estimated 41 mmHg. TEE in 4/22 showed D-shaped septum with mild RV enlargement/mildly decreased RV systolic function.  At baseline, has oxygen for exertion. Echo in 11/22 with D-shaped septum, moderate RVE and moderate RV dysfunction, also concerning for worsening valvular HD. RHC in 11/22 showed mild-moderately elevated right and left filling pressures, preserved cardiac output and severe primarily pulmonary arterial hypertension. PVR 4.8 WU. 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  - Continue Adempas 2.5 mg tid and Uptravi 2400 mcg BID for now.   2. Chronic diastolic CHF: In setting of pulmonary hypertension and valvular disease.  RHC in 11/22 showed mild-moderately elevated right and left filling pressures, preserved cardiac output, mRA 10, mPCWP 20, CI 3.89. 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 She is admitted again with worsened symptoms (NYHA class IV) with volume overload on exam.  Unfortunately, also with progressive cardiorenal syndrome and creatinine up to 5.43.  This is end-stage heart  disease.  Will try to diurese to help her symptomatically, but I worry that renal dysfunction will make this very difficult.  - Unna boots. - She has had Lasix 80 mg IV x 1.  Start Lasix gtt 15 mg/hr with metolazone 5 mg po x 1.  3. Mitral stenosis/aortic stenosis: TEE 11/22 with LVEF 60-65%, severe aortic valve stenosis with mean gradient of 46 mmHg and AVA 0.87 cm2, moderate mitral stenosis with mean gradient 6 mmHg.  Seen by structural heart team last admission, she is not a candidate for TAVR.  With heavy mitral calcification, would not be mitral valvuloplasty candidate.  4. H/o DVT: No chronic PE on prior V/Q scan.  Not anticoagulated with persistent GI bleeding.  5. OSA: Continue CPAP qHS.  6. AKI on CKD stage IV: Prior baseline creatinine had been around 1.6, up to around 3 in 11/22.  Now, admitted with hyperkalemia and creatinine 5.43. Concern for progressive cardiorenal syndrome in setting of end stage heart disease.  - Follow BMET with diuresis.  - She would be a poor candidate  for HD.  7. H/o GI bleeding: Recurrent.  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 In setting of severe AS, ?Heyde syndrome.  Iron deficient, had been receiving IV iron through [facility] .  Seen by GI last admission. She has opted for no further endoscopic evaluation.  Hgb stable today at 9.4 though she reports dark stool.  - Continue PPI. 8. OA:  Quite limited with mobility due to this.   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  She is not a candidate for surgical aortic/mitral valve replacement.  With heavily calcified mitral valve (possibly rheumatic), she is unlikely to be a valvuloplasty candidate.  Consideration of TAVR is complicated by the severe PH, which is concerning for anesthesia; chronic anemia which is going to be  an issue with post-procedure antiplatelet therapy; and progressive renal dysfunction (likely cardiorenal) which is going to limit our ability to give her contrast.  She would be a poor HD candidate with severe pulmonary hypertension.  She was seen by structural heart team and not thought to be a TAVR candidate.   At this point, I think hospice is her best option.  She is open to this.  I will try to diurese her over the next day or two to see if I can make her more comfortable and able to get home with hospice, but suspect she may need an inpatient facility.   She would like to stay on her [place] meds (these come from a grant and would not have to be paid for by the hospice program).     Length of Stay: 0  Loralie Champagne, MD  08/11/2021, 12:50 PM  Advanced Heart Failure Team Pager [phone] (M-F; 7a - 5p)  Please contact [facility] for night-coverage after hours (4p -7a ) and weekends on [email or web address]

## 2021-08-11 NOTE — Progress Notes (Signed)
CM informed bedside RN of communication/outreach with [facility]. CM awaiting call back.  Will continue to monitor.

## 2021-08-11 NOTE — ED Provider Notes (Signed)
[facility] EMERGENCY DEPARTMENT Provider Note   CSN: [id number] Arrival date & time: 08/10/21  1752     History Chief Complaint  Patient presents with   Shortness of Breath    Alisha [name] is a 80 y.o. female.  History of chronic kidney disease, heart failure with worsening shortness of breath.  Dark stools last 3 days.  Not on dialysis.  Recent admission for similar process.  Increased fluid retention in her legs.  Recently had her diuretics changed.  Over the last several days increased shortness of breath with exertion, decreased energy levels.  The history is provided by the patient.  Shortness of Breath Severity:  Moderate Onset quality:  Gradual Timing:  Constant Progression:  Worsening Chronicity:  Recurrent Context: activity   Relieved by:  Nothing Worsened by:  Nothing Associated symptoms: no abdominal pain, no chest pain, no claudication, no cough, no diaphoresis, no ear pain, no fever, no rash, no sore throat and no vomiting       Past Medical History:  Diagnosis Date   Aortic atherosclerosis ([place]) 08/30/2017   Aortic stenosis 08/30/2017   CAD (coronary artery disease), native coronary artery 08/30/2017   CHF (congestive heart failure) (HCC)    Chronic diastolic heart failure ([place]) 08/30/2017   Diabetes mellitus with peripheral vascular disease ([place]) 07/14/2014   Hypertensive heart disease without CHF 08/30/2017   Hypothyroidism (acquired) 08/30/2017   Pulmonary hypertension ([place]) 07/04/2017    Patient Active Problem List   Diagnosis Date Noted   Acute renal failure superimposed on stage 4 chronic kidney disease ([place]) 07/17/2021   Prolonged QT interval 07/17/2021   Gout 05/14/2021   High risk medication use 05/14/2021   AVM (arteriovenous malformation) of small bowel, 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   Iron deficiency anemia due to chronic blood loss 02/21/2018   Tubular adenoma of colon 01/03/2018   Thrombophlebitis of superficial veins of  right lower extremity 12/18/2017   CAD (coronary artery disease), native coronary artery 08/30/2017   Dyspnea 08/30/2017   Aortic atherosclerosis ([place]) 08/30/2017   Hypertensive heart disease without CHF 08/30/2017   Aortic stenosis 08/30/2017   Hypothyroidism (acquired) 08/30/2017   Chronic diastolic heart failure ([place]) 08/30/2017   Fungal dermatitis 07/06/2017   Pulmonary hypertension, unspecified ([place]) 07/04/2017   Cataract, nuclear sclerotic, left eye 82/99/3716   Diastolic dysfunction 96/78/9381   Non-rheumatic mitral valve stenosis 05/17/2016   LAE (left atrial enlargement) 05/17/2016   Chronic venous insufficiency 04/11/2016   Diabetes mellitus with peripheral vascular disease ([place]) 07/14/2014   Type II or unspecified type diabetes mellitus with peripheral circulatory disorders, not stated as uncontrolled(250.70) 06/05/2014   PAD (peripheral artery disease) ([place]) 05/20/2014   GERD (gastroesophageal reflux disease) 11/12/2012   Chronic kidney disease, stage III (moderate) ([place]) 04/19/2010    Past Surgical History:  Procedure Laterality Date   ANKLE SURGERY     APPENDECTOMY     DILATION AND CURETTAGE OF UTERUS     RIGHT HEART CATH N/A 01/01/2019   Procedure: RIGHT HEART CATH;  Surgeon: Larey Dresser, MD;  Location: [facility];  Service: Cardiovascular;  Laterality: N/A;   RIGHT HEART CATH N/A 07/19/2021   Procedure: RIGHT HEART CATH;  Surgeon: Larey Dresser, MD;  Location: [facility];  Service: Cardiovascular;  Laterality: N/A;   RIGHT/LEFT HEART CATH AND CORONARY ANGIOGRAPHY N/A 09/27/2017   Procedure: RIGHT/LEFT HEART CATH AND CORONARY ANGIOGRAPHY;  Surgeon: Sherren Mocha, MD;  Location: [facility];  Service: Cardiovascular;  Laterality: N/A;   TEE WITHOUT CARDIOVERSION N/A 01/07/2021   Procedure: TRANSESOPHAGEAL ECHOCARDIOGRAM (TEE);  Surgeon: Larey Dresser, MD;  Location: [facility] ENDOSCOPY;  Service: Cardiovascular;  Laterality: N/A;   TEE WITHOUT  CARDIOVERSION N/A 07/21/2021   Procedure: TRANSESOPHAGEAL ECHOCARDIOGRAM (TEE);  Surgeon: Larey Dresser, MD;  Location: [facility] ENDOSCOPY;  Service: Cardiovascular;  Laterality: N/A;     OB History   No obstetric history on file.     Family History  Problem Relation Age of Onset   Diabetes Mother    Bleeding Disorder Mother    Heart disease Father    Alzheimer's disease Father    Diabetes Sister    Diabetes Brother    Heart disease Brother    Healthy Daughter    Healthy Son     Social History   Tobacco Use   Smoking status: Never   Smokeless tobacco: Never  Vaping Use   Vaping Use: Never used  Substance Use Topics   Alcohol use: Yes    Comment: Occasionally   Drug use: No    Home Medications Prior to Admission medications   Medication Sig Start Date End Date Taking? Authorizing Provider  ADEMPAS 2.5 MG TABS TAKE 1 TABLET THREE TIMES A DAY Patient taking differently: Take 2.5 mg by mouth 3 (three) times daily. 05/24/21   Larey Dresser, MD  atorvastatin (LIPITOR) 20 MG tablet TAKE 1 TABLET BY MOUTH IN  THE MORNING 01/12/21   Saguier, Percell Miller, PA-C  cetirizine (ZYRTEC) 10 MG tablet Take 10 mg by mouth daily as needed for allergies.    [provider]  cholecalciferol (VITAMIN D) 25 MCG (1000 UNIT) tablet Take 1,000 Units by mouth daily.    [provider]  fenofibrate 160 MG tablet Take 1 tablet (160 mg total) by mouth every morning. 11/17/20   Saguier, Percell Miller, PA-C  ferrous sulfate 325 (65 FE) MG tablet Take 1 tablet (325 mg total) by mouth daily with breakfast. Morning and night 11/17/20 05/26/22  Saguier, Percell Miller, PA-C  glucose blood (ACCU-CHEK AVIVA PLUS) test strip Check blood sugar TID dx: E11.9 04/07/21   Saguier, Percell Miller, PA-C  levothyroxine (SYNTHROID) 125 MCG tablet Take 1 tablet (125 mcg total) by mouth daily before breakfast. 11/17/20 05/26/22  Saguier, Percell Miller, PA-C  loperamide (IMODIUM A-D) 2 MG tablet Take 2 mg by mouth daily as needed for diarrhea or  loose stools.    [provider]  loratadine (CLARITIN) 10 MG tablet Take 10 mg by mouth daily as needed for allergies.     [provider]  methylPREDNISolone (MEDROL) 4 MG tablet Standard 6 day taper dose. 07/27/21   Saguier, Percell Miller, PA-C  metolazone (ZAROXOLYN) 2.5 MG tablet Take 1 tablet (2.5 mg total) by mouth once a week. Take potassium 9meq with this 07/31/21   Darliss Cheney, MD  metoprolol succinate (TOPROL XL) 25 MG 24 hr tablet Take 1 tablet (25 mg total) by mouth 2 (two) times daily. 11/17/20 11/17/21  Saguier, Percell Miller, PA-C  Multiple Vitamins-Minerals (PRESERVISION AREDS 2 PO) Take 1 capsule by mouth 2 (two) times daily.    [provider]  pantoprazole (PROTONIX) 40 MG tablet TAKE 1 TABLET BY MOUTH  TWICE DAILY 04/07/21   Saguier, Percell Miller, PA-C  potassium chloride (KLOR-CON) 10 MEQ tablet Take 2 tablets (20 mEq total) by mouth daily. Take an extra 29meq with metolazone 07/24/21 08/23/21  Darliss Cheney, MD  torsemide 60 MG TABS Take 60 mg by mouth 2 (two) times daily. 07/24/21 08/23/21  Pahwani,  Einar Grad, MD  UPTRAVI 1600 MCG TABS Take 1 tablet (1,600 mcg total) by mouth 2 (two) times daily. Take with 800 for a total of 2400 mg 01/22/21   Larey Dresser, MD  UPTRAVI 800 MCG TABS Take 1 tablet (800 mcg total) by mouth 2 (two) times daily. Take with 1600 mcg for a total of 2400 mg 01/22/21   Larey Dresser, MD    Allergies    Other, Adhesive [tape], Macitentan, and Nickel  Review of Systems   Review of Systems  Constitutional:  Negative for chills, diaphoresis and fever.  HENT:  Negative for ear pain and sore throat.   Eyes:  Negative for pain and visual disturbance.  Respiratory:  Positive for shortness of breath. Negative for cough.   Cardiovascular:  Positive for leg swelling. Negative for chest pain, palpitations and claudication.  Gastrointestinal:  Positive for blood in stool. Negative for abdominal distention, abdominal pain and vomiting.  Genitourinary:   Negative for dysuria and hematuria.  Musculoskeletal:  Negative for arthralgias and back pain.  Skin:  Negative for color change and rash.  Neurological:  Negative for seizures and syncope.  All other systems reviewed and are negative.  Physical Exam Updated Vital Signs BP (!) 118/51 (BP Location: Left Arm)   Pulse 67   Temp 97.6 F (36.4 C) (Oral)   Resp 17   SpO2 96%   Physical Exam Vitals and nursing note reviewed.  Constitutional:      General: She is not in acute distress.    Appearance: She is well-developed.  HENT:     Head: Normocephalic and atraumatic.  Eyes:     Extraocular Movements: Extraocular movements intact.     Conjunctiva/sclera: Conjunctivae normal.     Pupils: Pupils are equal, round, and reactive to light.  Cardiovascular:     Rate and Rhythm: Normal rate and regular rhythm.     Pulses: Normal pulses.     Heart sounds: No murmur heard. Pulmonary:     Effort: Tachypnea present. No respiratory distress.     Breath sounds: Decreased breath sounds and rhonchi present.  Abdominal:     Palpations: Abdomen is soft.     Tenderness: There is no abdominal tenderness.  Musculoskeletal:        General: No swelling.     Cervical back: Normal range of motion and neck supple.     Right lower leg: Edema (3+) present.     Left lower leg: Edema (3+) present.  Skin:    General: Skin is warm and dry.     Capillary Refill: Capillary refill takes less than 2 seconds.  Neurological:     Mental Status: She is alert.  Psychiatric:        Mood and Affect: Mood normal.    ED Results / Procedures / Treatments   Labs (all labs ordered are listed, but only abnormal results are displayed) Labs Reviewed  COMPREHENSIVE METABOLIC PANEL - Abnormal; Notable for the following components:      Result Value   Potassium 5.7 (*)    CO2 14 (*)    Glucose, Bld 198 (*)    BUN 77 (*)    Creatinine, Ser 5.43 (*)    Calcium 8.2 (*)    Total Protein 6.4 (*)    Albumin 3.3 (*)     AST 10 (*)    GFR, Estimated 8 (*)    All other components within normal limits  CBC - Abnormal; Notable for the following  components:   RBC 3.42 (*)    Hemoglobin 8.9 (*)    HCT 30.6 (*)    MCHC 29.1 (*)    RDW 19.0 (*)    Platelets 412 (*)    All other components within normal limits  RESP PANEL BY RT-PCR (FLU A&B, COVID) ARPGX2  BASIC METABOLIC PANEL  BRAIN NATRIURETIC PEPTIDE  POC OCCULT BLOOD, ED  TYPE AND SCREEN  TROPONIN I (HIGH SENSITIVITY)    EKG EKG Interpretation  Date/Time:  Tuesday August 10 2021 18:16:20 EST Ventricular Rate:  62 PR Interval:  174 QRS Duration: 88 QT Interval:  452 QTC Calculation: 458 R Axis:   138 Text Interpretation: Normal sinus rhythm Possible Right ventricular hypertrophy Abnormal ECG Confirmed by Lennice Sites ([id number]) on 08/11/2021 7:28:38 AM  Radiology DG Chest 2 View  Result Date: 08/10/2021 CLINICAL DATA:  Shortness of breath EXAM: CHEST - 2 VIEW COMPARISON:  07/18/2021, CT 02/17/2021 FINDINGS: Cardiomegaly with vascular congestion, pulmonary edema and small right greater than left pleural effusions. Airspace disease at right base likely atelectasis. IMPRESSION: Cardiomegaly with vascular congestion, edema and small right greater than left pleural effusions. Electronically Signed   By: Donavan Foil M.D.   On: 08/10/2021 19:28    Procedures Procedures   Medications Ordered in ED Medications  sodium zirconium cyclosilicate (LOKELMA) packet 10 g (has no administration in time range)    ED Course  I have reviewed the triage vital signs and the nursing notes.  Pertinent labs & imaging results that were available during my care of the patient were reviewed by me and considered in my medical decision making (see chart for details).    MDM Rules/Calculators/A&P                           RAMI WADDLE is here for shortness of breath, dark stools.  Overall unremarkable vitals.  No fever.  Brown stool on exam.  Hemoglobin is  stable from several weeks ago at 8.9.  Overall clinical picture looks to be volume overload.  Creatinine is up from her baseline to 5.4.  Potassium is 5.7.  Bicarb is 14.  EKG shows sinus rhythm.  No hyperkalemic changes.  Chest x-ray with volume overload changes.  She has significant pitting edema bilaterally in her legs.  Decreased breath sounds throughout.  She is having worsening exertional shortness of breath for the last several days.  Recent admission for the same.  Creatinine has gotten worse over the last year.  Her baseline was in the 3 range the past several months but used to be much more improved from that last year.  She does not want to do dialysis, she had significant cardiac work-up recently and ultimately they decided against doing valve replacement.  They changed her diuretic dosing with metolazone and torsemide.  My suspicion is that AKI likely from diuretics but she states that she has not made good urine output over the last day.  Overall she has a complicated medical process.  She has cardiorenal syndrome and overall goals of care discussion likely need to be made as she is now having frequent issues with her volume status.  Will admit to hospitalist for further optimization given metabolic derangements and symptoms.  Milly Jakob has been given.  Will repeat BMP and add troponin and BNP as patient has not had labs in over 12 hours.  This chart was dictated using voice recognition software.  Despite best efforts to proofread,  errors can occur which can change the documentation meaning.   Final Clinical Impression(s) / ED Diagnoses Final diagnoses:  SOB (shortness of breath)  Hypervolemia, unspecified hypervolemia type  AKI (acute kidney injury) ([place])  Hyperkalemia  Acute respiratory failure, unspecified whether with hypoxia or hypercapnia [facility])    Rx / DC Orders ED Discharge Orders     None        Lennice Sites, DO 08/11/21 [id number]

## 2021-08-11 NOTE — Progress Notes (Signed)
Heart Failure Navigator Progress Note  Assessed for Heart & Vascular TOC clinic readiness.  Patient does not meet criteria due to AHF team consulted this hospitalization.   Navigator available for reassessment of patient.   Pricilla Holm, MSN, RN Heart Failure Nurse Navigator [phone]

## 2021-08-11 NOTE — Discharge Planning (Signed)
[facility] returned a call regarding residential hospice.  [facility] will be able to admit pt TOMORROW 08/12/21 and has been in contact with the family.  RNCM will continue to follow for disposition needs.

## 2021-08-11 NOTE — H&P (Addendum)
History and Physical    Alisha [name] CLE:[id number] DOB: 1941/02/12 DOA: 08/10/2021  Referring MD/NP/PA: Lennice Sites, DO PCP: Mackie Pai, PA-C  Consultants: Exie Parody, MD- gastroenterology Loralie Champagne, MD- cardiology Lavonna Monarch, MD-  dermatology Patient coming from: Independent living at the [facility] in [facility] via EMS  Chief Complaint: Shortness of breath  I have personally briefly reviewed patient's old medical records in [place]   HPI: Alisha [name] is a 80 y.o. 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  She had just recently been hospitalized 11/4-11/12 with acute renal failure superimposed on chronic kidney disease in the setting of acute on chronic hypoxic respiratory failure secondary to CHF and severe aortic stenosis.  During that admission she was treated with Lasix 80 mg IV twice daily with improvement in symptoms.  Evaluated by palliative care with confirmation of DNR status, and cardiac was not thought to be a candidate for hemodialysis or TAVR in her current condition.  After getting home patient had 1 good day in thereafter had a flare of arthritis for which she was placed on a steroid taper with some improvement.  She had initially been off of oxygen, but have been requiring it thereafter.  She is getting around using a walker, but had reported being more short of breath with exertion.  Noted associated symptoms of progressively worsening lower extremity swelling, abdominal swelling, dark diarrhea stools for the last 3 days, and decreased urine output.  She had been advised by her cardiologist Dr. Aundra Dubin to increased furosemide to 80 mg twice daily which she had done for the last day or so  but had not noticed provement.  Her birthday was yesterday and son notes that she did not even want to eat the banana pudding that family made for her and ended up calling EMS due to her symptoms.  She reports that she had not peed for 3 days prior to this morning at 6 AM.  She does not feel that she is ready to be on hospice at this time, because they discontinue all of her meds and would like to continue with therapies as long as they are helping.  In route with EMS patient was noted to have O2 saturations as low as 88% room air   ED Course: Upon admission to the emergency department patient was seen to be afebrile with respirations 15-21, blood pressures 113/48-118/51, and O2 saturations 91 to 100% currently on 3 L nasal cannula oxygen.  Labs from 11/29 significant for hemoglobin 8.9, platelets 412, potassium 5.7, BUN 77, creatinine 5.43, glucose 198, calcium 8.2, and albumin 3.3.  Chest x-ray noted cardiomegaly with vascular congestion, edema, and all right greater than left pleural effusion.  Stool guaiacs were noted to be negative.  Patient had been given 10 g of Lokelma.  Review of Systems  Constitutional:  Positive for malaise/fatigue. Negative for fever.  HENT:  Positive for hearing loss.   Eyes:  Negative for photophobia and pain.  Respiratory:  Positive for shortness of breath.   Cardiovascular:  Positive for leg swelling. Negative for chest pain.  Gastrointestinal:  Positive for blood in stool and diarrhea. Negative for vomiting.  Genitourinary:  Negative for dysuria.       Positive for decreased urine  output  Musculoskeletal:  Positive for joint pain. Negative for falls.  Skin:  Negative for rash.  Neurological:  Positive for weakness. Negative for loss of consciousness.  Psychiatric/Behavioral:  Negative for substance abuse. The patient has insomnia.    Past Medical History:  Diagnosis Date   Aortic atherosclerosis ([place]) 08/30/2017   Aortic stenosis 08/30/2017   CAD (coronary  artery disease), native coronary artery 08/30/2017   CHF (congestive heart failure) (HCC)    Chronic diastolic heart failure ([place]) 08/30/2017   Diabetes mellitus with peripheral vascular disease ([place]) 07/14/2014   Hypertensive heart disease without CHF 08/30/2017   Hypothyroidism (acquired) 08/30/2017   Pulmonary hypertension ([place]) 07/04/2017    Past Surgical History:  Procedure Laterality Date   ANKLE SURGERY     APPENDECTOMY     DILATION AND CURETTAGE OF UTERUS     RIGHT HEART CATH N/A 01/01/2019   Procedure: RIGHT HEART CATH;  Surgeon: Larey Dresser, MD;  Location: [place] [facility];  Service: Cardiovascular;  Laterality: N/A;   RIGHT HEART CATH N/A 07/19/2021   Procedure: RIGHT HEART CATH;  Surgeon: Larey Dresser, MD;  Location: [place] [facility];  Service: Cardiovascular;  Laterality: N/A;   RIGHT/LEFT HEART CATH AND CORONARY ANGIOGRAPHY N/A 09/27/2017   Procedure: RIGHT/LEFT HEART CATH AND CORONARY ANGIOGRAPHY;  Surgeon: Sherren Mocha, MD;  Location: [place] [facility];  Service: Cardiovascular;  Laterality: N/A;   TEE WITHOUT CARDIOVERSION N/A 01/07/2021   Procedure: TRANSESOPHAGEAL ECHOCARDIOGRAM (TEE);  Surgeon: Larey Dresser, MD;  Location: [facility] ENDOSCOPY;  Service: Cardiovascular;  Laterality: N/A;   TEE WITHOUT CARDIOVERSION N/A 07/21/2021   Procedure: TRANSESOPHAGEAL ECHOCARDIOGRAM (TEE);  Surgeon: Larey Dresser, MD;  Location: [facility] ENDOSCOPY;  Service: Cardiovascular;  Laterality: N/A;     reports that she has never smoked. She has never used smokeless tobacco. She reports current alcohol use. She reports that she does not use drugs.  Allergies  Allergen Reactions   Other Palpitations    Surgical metal (stents, pins, plates) keeps pt from healing   Adhesive [Tape] Other (See Comments)    Irritates skin with rash   Macitentan Other (See Comments)    Lower extremity edema    Nickel Rash    Family History  Problem Relation Age of Onset   Diabetes Mother     Bleeding Disorder Mother    Heart disease Father    Alzheimer's disease Father    Diabetes Sister    Diabetes Brother    Heart disease Brother    Healthy Daughter    Healthy Son     Prior to Admission medications   Medication Sig Start Date End Date Taking? Authorizing Provider  ADEMPAS 2.5 MG TABS TAKE 1 TABLET THREE TIMES A DAY Patient taking differently: Take 2.5 mg by mouth 3 (three) times daily. 05/24/21   Larey Dresser, MD  atorvastatin (LIPITOR) 20 MG tablet TAKE 1 TABLET BY MOUTH IN  THE MORNING 01/12/21   Saguier, Percell Miller, PA-C  cetirizine (ZYRTEC) 10 MG tablet Take 10 mg by mouth daily as needed for allergies.    [provider]  cholecalciferol (VITAMIN D) 25 MCG (1000 UNIT) tablet Take 1,000 Units by mouth daily.    [provider]  fenofibrate 160 MG tablet Take 1 tablet (160 mg total) by mouth every morning. 11/17/20   Saguier, Percell Miller, PA-C  ferrous sulfate 325 (65 FE) MG tablet Take 1 tablet (325 mg total) by mouth daily with breakfast. Morning and night 11/17/20  05/26/22  Saguier, Percell Miller, PA-C  glucose blood (ACCU-CHEK AVIVA PLUS) test strip Check blood sugar TID dx: E11.9 04/07/21   Saguier, Percell Miller, PA-C  levothyroxine (SYNTHROID) 125 MCG tablet Take 1 tablet (125 mcg total) by mouth daily before breakfast. 11/17/20 05/26/22  Saguier, Percell Miller, PA-C  loperamide (IMODIUM A-D) 2 MG tablet Take 2 mg by mouth daily as needed for diarrhea or loose stools.    [provider]  loratadine (CLARITIN) 10 MG tablet Take 10 mg by mouth daily as needed for allergies.     [provider]  methylPREDNISolone (MEDROL) 4 MG tablet Standard 6 day taper dose. 07/27/21   Saguier, Percell Miller, PA-C  metolazone (ZAROXOLYN) 2.5 MG tablet Take 1 tablet (2.5 mg total) by mouth once a week. Take potassium 16meq with this 07/31/21   Darliss Cheney, MD  metoprolol succinate (TOPROL XL) 25 MG 24 hr tablet Take 1 tablet (25 mg total) by mouth 2 (two) times daily. 11/17/20 11/17/21  Saguier,  Percell Miller, PA-C  Multiple Vitamins-Minerals (PRESERVISION AREDS 2 PO) Take 1 capsule by mouth 2 (two) times daily.    [provider]  pantoprazole (PROTONIX) 40 MG tablet TAKE 1 TABLET BY MOUTH  TWICE DAILY 04/07/21   Saguier, Percell Miller, PA-C  potassium chloride (KLOR-CON) 10 MEQ tablet Take 2 tablets (20 mEq total) by mouth daily. Take an extra 65meq with metolazone 07/24/21 08/23/21  Darliss Cheney, MD  torsemide 60 MG TABS Take 60 mg by mouth 2 (two) times daily. 07/24/21 08/23/21  Darliss Cheney, MD  UPTRAVI 1600 MCG TABS Take 1 tablet (1,600 mcg total) by mouth 2 (two) times daily. Take with 800 for a total of 2400 mg 01/22/21   Larey Dresser, MD  UPTRAVI 800 MCG TABS Take 1 tablet (800 mcg total) by mouth 2 (two) times daily. Take with 1600 mcg for a total of 2400 mg 01/22/21   Larey Dresser, MD    Physical Exam:  Constitutional: Elderly female who appears to be in no acute distress at this time. Vitals:   08/10/21 2212 08/11/21 0053 08/11/21 0410 08/11/21 0650  BP: (!) 116/50 (!) 113/50 (!) 116/49 (!) 118/51  Pulse: 61 68 62 67  Resp: (!) 21 16 15 17   Temp:  97.6 F (36.4 C)    TempSrc:  Oral    SpO2: 100% 98% 97% 96%   Eyes: PERRL, lids and conjunctivae normal ENMT: Mucous membranes are moist. Posterior pharynx clear of any exudate or lesions. Hearing aids in place Neck: normal, supple, no masses, no thyromegaly.  JVD present Respiratory: Normal respiratory effort with crackles appreciated in both lower lung fields.  Currently on 3 L nasal cannula oxygen with O2 saturations maintained.  Able to talk in fairly complete sentences. Cardiovascular: Regular rate and rhythm with positive systolic murmur.  +2 pitting bilateral lower extremity edema. Abdomen: Abdominal distention appreciated with normal bowel sounds. Musculoskeletal: no clubbing / cyanosis. No joint deformity upper and lower extremities. Good ROM, no contractures. Normal muscle tone.  Skin: no rashes, lesions,  ulcers. No induration Neurologic: CN 2-12 grossly intact. Sensation intact, DTR normal. Strength 5/5 in all 4.  Psychiatric: Normal judgment and insight. Alert and oriented x 3. Normal mood.     Labs on Admission: I have personally reviewed following labs and imaging studies  CBC: Recent Labs  Lab 08/10/21 1814  WBC 9.6  HGB 8.9*  HCT 30.6*  MCV 89.5  PLT 809*   Basic Metabolic Panel: Recent Labs  Lab 08/10/21 1814  NA 136  K 5.7*  CL 109  CO2 14*  GLUCOSE 198*  BUN 77*  CREATININE 5.43*  CALCIUM 8.2*   GFR: Estimated Creatinine Clearance: 8.2 mL/min (A) (by C-G formula based on SCr of 5.43 mg/dL (H)). Liver Function Tests: Recent Labs  Lab 08/10/21 1814  AST 10*  ALT 6  ALKPHOS 57  BILITOT 0.6  PROT 6.4*  ALBUMIN 3.3*   No results for input(s): LIPASE, AMYLASE in the last 168 hours. No results for input(s): AMMONIA in the last 168 hours. Coagulation Profile: No results for input(s): INR, PROTIME in the last 168 hours. Cardiac Enzymes: No results for input(s): CKTOTAL, CKMB, CKMBINDEX, TROPONINI in the last 168 hours. BNP (last 3 results) Recent Labs    01/26/21 1313  PROBNP 823.0*   HbA1C: No results for input(s): HGBA1C in the last 72 hours. CBG: No results for input(s): GLUCAP in the last 168 hours. Lipid Profile: No results for input(s): CHOL, HDL, LDLCALC, TRIG, CHOLHDL, LDLDIRECT in the last 72 hours. Thyroid Function Tests: No results for input(s): TSH, T4TOTAL, FREET4, T3FREE, THYROIDAB in the last 72 hours. Anemia Panel: No results for input(s): VITAMINB12, FOLATE, FERRITIN, TIBC, IRON, RETICCTPCT in the last 72 hours. Urine analysis:    Component Value Date/Time   COLORURINE YELLOW 07/18/2021 0342   APPEARANCEUR HAZY (A) 07/18/2021 0342   LABSPEC 1.009 07/18/2021 0342   PHURINE 5.0 07/18/2021 0342   GLUCOSEU NEGATIVE 07/18/2021 0342   HGBUR NEGATIVE 07/18/2021 0342   BILIRUBINUR NEGATIVE 07/18/2021 0342   KETONESUR NEGATIVE  07/18/2021 0342   PROTEINUR NEGATIVE 07/18/2021 0342   NITRITE NEGATIVE 07/18/2021 0342   LEUKOCYTESUR LARGE (A) 07/18/2021 0342   Sepsis Labs: No results found for this or any previous visit (from the past 240 hour(s)).   Radiological Exams on Admission: DG Chest 2 View  Result Date: 08/10/2021 CLINICAL DATA:  Shortness of breath EXAM: CHEST - 2 VIEW COMPARISON:  07/18/2021, CT 02/17/2021 FINDINGS: Cardiomegaly with vascular congestion, pulmonary edema and small right greater than left pleural effusions. Airspace disease at right base likely atelectasis. IMPRESSION: Cardiomegaly with vascular congestion, edema and small right greater than left pleural effusions. Electronically Signed   By: Donavan Foil M.D.   On: 08/10/2021 19:28    EKG: Independently reviewed.  Normal sinus rhythm at 62 bpm with right ventricular hypertrophy  Assessment/Plan Acute on chronic respiratory failure with hypoxia secondary to pulmonary hypertension with RV failure and acute on chronic diastolic congestive heart failure exacerbation: She presents with complaints of worsening shortness of breath and  progressively worsening swelling from her legs into her abdomen.  On physical exam patient noted to have crackles and at least 2+ pitting edema.  Chest x-ray noting cardiomegaly with vascular congestion, edema, and bilateral pleural effusions worse on the right.  BNP elevated at 1634.9.  She had been taking torsemide 80 mg twice daily for the last day or so without any improvement in symptoms.  TEE from 11/9 noted EF of 6065% with small pericardial effusion, severe aortic stenosis with aortic valve peak gradient 74.1 mmHg. -Admit to a telemetry bed -Strict I&Os and daily weights -Lasix 80 mg IV twice daily -Palliative care consulted as patient appears to have a poor overall prognosis if hemodialysis does not appear to be a high option -Dr. Aundra Dubin of cardiology notified and we will come and evaluate the patient.    Acute renal failure superimposed on chronic kidney disease stage IV metabolic acidosis: Patient presents with complaints of little to no  output for the 3 days prior to coming into the hospital.  Creatinine 5.43 with BUN 77.  Repeat check 5.38 today.  Baseline creatinine previously had been around 3 on discharge.  Labs also not metabolic acidosis with CO2 13 without elevated anion gap. -Sodium bicarb -Continue to monitor kidney function with diuresis  Hyperkalemia: Acute.  Potassium elevated at 5.7 in the setting of worsening kidney function and potassium supplementation.  Patient was given 10 g of Lokelma p.o. repeat potassium noted to be 5 today. -Hold scheduled potassium supplementation  Essential hypertension: Current blood pressures are low normal.   -Will hold scheduled metoprolol at this time  Diabetes mellitus type 2 -Hypoglycemic protocol -Did not continue metformin  Iron deficiency anemia due to chronic blood loss: Stable.  Patient present with complaints of blood in stools over the last 3 days.  Hemoglobin 8.9 on admission which appears near patient's baseline most recent checks and stool guaiacs were noted to be negative.  Patient had iron infusion a few weeks back. -Continue to monitor   DNR: Present on admission  DVT prophylaxis: SCD's due to reported blood in stool  Code Status:  DNR Family Communication: Updated son present at bedside Disposition Plan: To be to Consults called: Cardiology and palliative care Admission status: Inpatient, require more than 2 midnight stay in the process of trying to diurese patient  Norval Morton MD [facility]   If 7PM-7AM, please contact night-coverage   08/11/2021, 8:49 AM

## 2021-08-12 ENCOUNTER — Telehealth: Payer: Self-pay | Admitting: Medical

## 2021-08-12 DIAGNOSIS — J9600 Acute respiratory failure, unspecified whether with hypoxia or hypercapnia: Secondary | ICD-10-CM

## 2021-08-12 DIAGNOSIS — R0602 Shortness of breath: Secondary | ICD-10-CM

## 2021-08-12 DIAGNOSIS — Z789 Other specified health status: Secondary | ICD-10-CM

## 2021-08-12 DIAGNOSIS — Z515 Encounter for palliative care: Secondary | ICD-10-CM

## 2021-08-12 LAB — BASIC METABOLIC PANEL
Anion gap: 12 (ref 5–15)
BUN: 82 mg/dL — ABNORMAL HIGH (ref 8–23)
CO2: 16 mmol/L — ABNORMAL LOW (ref 22–32)
Calcium: 7.8 mg/dL — ABNORMAL LOW (ref 8.9–10.3)
Chloride: 109 mmol/L (ref 98–111)
Creatinine, Ser: 5.66 mg/dL — ABNORMAL HIGH (ref 0.44–1.00)
GFR, Estimated: 7 mL/min — ABNORMAL LOW (ref >60)
Glucose, Bld: 131 mg/dL — ABNORMAL HIGH (ref 70–99)
Potassium: 4.6 mmol/L (ref 3.5–5.1)
Sodium: 137 mmol/L (ref 135–145)

## 2021-08-12 NOTE — Progress Notes (Signed)
Patient ID: Alisha [name], female   DOB: [birth year], 80 y.o.   MRN: [id number]     Advanced Heart Failure Rounding Note  PCP-Cardiologist: None   Subjective:    Creatinine mildly higher at 5.66 today.  I/Os not recorded.  She is comfortable at rest.    Objective:   Weight Range:   There is no height or weight on file to calculate BMI.   Vital Signs:   Pulse Rate:  [65-92] 83 (12/01 0845) Resp:  [12-27] 18 (12/01 0845) BP: (100-123)/(42-107) 120/55 (12/01 0845) SpO2:  [91 %-100 %] 95 % (12/01 0845)    Weight change: There were no vitals filed for this visit.  Intake/Output:   Intake/Output Summary (Last 24 hours) at 08/12/2021 0901 Last data filed at 08/11/2021 1501 Gross per 24 hour  Intake --  Output 250 ml  Net -250 ml      Physical Exam    General:  Well appearing. No resp difficulty HEENT: Normal Neck: Supple. JVP 16 cm. Carotids 2+ bilat; no bruits. No lymphadenopathy or thyromegaly appreciated. Cor: PMI nondisplaced. Regular rate & rhythm. No rubs, gallops or murmurs. Lungs: Clear Abdomen: Soft, nontender, nondistended. No hepatosplenomegaly. No bruits or masses. Good bowel sounds. Extremities: No cyanosis, clubbing, rash. 1+ ankle edema.  Neuro: Alert & orientedx3, cranial nerves grossly intact. moves all 4 extremities w/o difficulty. Affect pleasant   Telemetry   NSR 80s (personally reviewed)  Labs    CBC Recent Labs    08/10/21 1814 08/11/21 0830  WBC 9.6 7.2  HGB 8.9* 9.4*  HCT 30.6* 31.1*  MCV 89.5 88.9  PLT 412* 672   Basic Metabolic Panel Recent Labs    08/11/21 0830 08/12/21 0747  NA 135 137  K 5.0 4.6  CL 109 109  CO2 13* 16*  GLUCOSE 140* 131*  BUN 81* 82*  CREATININE 5.38* 5.66*  CALCIUM 8.2* 7.8*   Liver Function Tests Recent Labs    08/10/21 1814  AST 10*  ALT 6  ALKPHOS 57  BILITOT 0.6  PROT 6.4*  ALBUMIN 3.3*   No results for input(s): LIPASE, AMYLASE in the last 72 hours. Cardiac Enzymes No results for  input(s): CKTOTAL, CKMB, CKMBINDEX, TROPONINI in the last 72 hours.  BNP: BNP (last 3 results) Recent Labs    07/17/21 0336 07/19/21 0103 08/11/21 0830  BNP 1,247.8* 1,758.6* 1,634.9*    ProBNP (last 3 results) Recent Labs    01/26/21 1313  PROBNP 823.0*     D-Dimer No results for input(s): DDIMER in the last 72 hours. Hemoglobin A1C No results for input(s): HGBA1C in the last 72 hours. Fasting Lipid Panel No results for input(s): CHOL, HDL, LDLCALC, TRIG, CHOLHDL, LDLDIRECT in the last 72 hours. Thyroid Function Tests No results for input(s): TSH, T4TOTAL, T3FREE, THYROIDAB in the last 72 hours.  Invalid input(s): FREET3  Other results:   Imaging    No results found.   Medications:     Scheduled Medications:  atorvastatin  20 mg Oral Daily   fenofibrate  160 mg Oral Daily   levothyroxine  125 mcg Oral QAC breakfast   pantoprazole  40 mg Oral BID   Riociguat  2.5 mg Oral TID   Selexipag  800 mcg Oral BID   And   Selexipag  1,600 mcg Oral BID   sodium bicarbonate  650 mg Oral BID   sodium chloride flush  3 mL Intravenous Q12H    Infusions:  furosemide (LASIX) 200 mg in dextrose 5%  100 mL (2mg /mL) infusion 15 mg/hr (08/12/21 0411)    PRN Medications: acetaminophen **OR** acetaminophen, albuterol, loratadine   Assessment/Plan   1. Pulmonary hypertension with RV failure:  Severe PAH with PVR 7 WU on 1/19 RHC.  Etiology uncertain.  She has a history of DVT remotely but V/Q scan in 2/19 did not show evidence for chronic PE. She has OSA and uses CPAP.  Serologic workup showed only positive ANA but when additional serologies were done, they were all negative.  CT chest with no ILD or emphysema.  Valvular heart disease does not appear significant enough to cause this degree of PH.  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  She did not tolerate Opsumit or ambrisentan due to significant lower extremity edema and worsening dyspnea.  [place] in 4/20 showed severe pulmonary hypertension  with normal filling pressures and preserved cardiac output.  Echo in 4/21 showed normal RV with PASP 50 mmHg, echo 4/22 showed normal RV with PASP estimated 41 mmHg. TEE in 4/22 showed D-shaped septum with mild RV enlargement/mildly decreased RV systolic function.  At baseline, has oxygen for exertion. Echo in 11/22 with D-shaped septum, moderate RVE and moderate RV dysfunction, also concerning for worsening valvular HD. RHC in 11/22 showed mild-moderately elevated right and left filling pressures, preserved cardiac output and severe primarily pulmonary arterial hypertension. PVR 4.8 WU. 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  - Continue Adempas 2.5 mg tid and Uptravi 2400 mcg BID for now.   2. Chronic diastolic CHF: In setting of pulmonary hypertension and valvular disease.  RHC in 11/22 showed mild-moderately elevated right and left filling pressures, preserved cardiac output, mRA 10, mPCWP 20, CI 3.89. 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 She is admitted again with worsened symptoms (NYHA class IV) with volume overload on exam. Unfortunately, also with progressive cardiorenal syndrome and creatinine up to 5.66.  This is end-stage heart disease. Will try to diurese to help her symptomatically, but I worry that renal dysfunction will make this very difficult. She is now on Lasix gtt 15 mg/hr.  - Continue Lasix gtt while in the hospital, when discharged to hospice house would continue her prior to admission torsemide 80 mg bid + metolazone 2.5 once weekly for comfort/dyspnea control.  3. Mitral stenosis/aortic stenosis: TEE 11/22 with LVEF 60-65%, severe aortic valve stenosis with mean gradient of 46 mmHg and AVA 0.87 cm2, moderate mitral stenosis with mean gradient 6 mmHg.  Seen by structural heart team last admission, she is not a candidate for TAVR.  With heavy mitral calcification, would not be mitral  valvuloplasty candidate.  4. H/o DVT: No chronic PE on prior V/Q scan.  Not anticoagulated with persistent GI bleeding.  5. OSA: Continue CPAP qHS.  6. AKI on CKD stage IV: Prior baseline creatinine had been around 1.6, up to around 3 in 11/22.  Now, admitted with hyperkalemia and creatinine 5.43. Concern for progressive cardiorenal syndrome in setting of end stage heart disease.  - Follow BMET with diuresis.  - She would be a poor candidate for HD.  7. H/o GI bleeding: Recurrent.  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 In setting of severe AS, ?Heyde syndrome.  Iron deficient, had been receiving IV iron through [facility] .  Seen by GI last admission. She has opted for no further endoscopic evaluation.  Hgb stable today at 9.4 though she reports dark stool.  - Continue PPI. 8. OA:  Quite limited with mobility due to this.   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  She is not a candidate for surgical aortic/mitral valve replacement.  With heavily calcified mitral valve (possibly rheumatic), she is unlikely to be a valvuloplasty candidate.  Consideration of TAVR is complicated by the severe PH, which is concerning for anesthesia; chronic anemia which is going to be an issue with post-procedure antiplatelet therapy; and progressive renal dysfunction (likely cardiorenal) which is going to limit our ability to give her contrast.  She would be a poor HD candidate with severe pulmonary hypertension.  She was seen by structural heart team and not thought to be a TAVR candidate.   She plans for discharge to hospice house in [facility] today.  She would like to stay on her [place] meds (these come from a grant and would not have to be paid for by the hospice program), I think this is reasonable as she can take her own supply.      Length of Stay: 1  Loralie Champagne, MD  08/12/2021, 9:01 AM  Advanced Heart Failure Team Pager [phone] (M-F; 7a - 5p)  Please contact [facility] for night-coverage after hours (5p -7a ) and weekends on [email or web address]

## 2021-08-12 NOTE — Discharge Planning (Signed)
Report to be called to [phone]

## 2021-08-12 NOTE — Progress Notes (Addendum)
Brief Palliative Medicine Progress Note:  PMT consult received and chart reviewed.   Noted Ms. Camp has already agreed to hospice services at [facility] in [place] with plan for transfer today. Spoke with Dr. Cruzita Lederer - as goals are now clear, there are no further PMT needs at this time.   Please reach out to PMT with any needs prior to discharge.  Thank you for this consult.  Rustyn Conery M. Tamala Julian [facility] Palliative Medicine Team Team Phone: [phone] NO CHARGE

## 2021-08-12 NOTE — Discharge Planning (Signed)
RNCM informed team of 1230 ETA for [facility].  [facility] will transport pt to [facility].

## 2021-08-12 NOTE — Telephone Encounter (Signed)
Open for review.

## 2021-08-12 NOTE — Discharge Planning (Signed)
RNCM spoke with [name] with [facility].  Alisha [name] will communicate when to send pt today.  RNCM will update team.

## 2021-08-12 NOTE — Progress Notes (Signed)
This RN called report to [place]. Report given to Salina April, RN.

## 2021-08-12 NOTE — Discharge Summary (Signed)
Physician Discharge Summary  Alisha [name] WJX:[id number] DOB: Apr 17, 1941 DOA: 08/10/2021  PCP: Mackie Pai, PA-C  Admit date: 08/10/2021 Discharge date: 08/12/2021  Admitted From: home Disposition: Residential hospice  Recommendations for Outpatient Follow-up:  Follow up with hospice MD  Discharge Condition: Stable CODE STATUS: DNR Diet recommendation: Comfort feeding  HPI: Per admitting MD, Alisha [name] is a 80 y.o. 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  She had just recently been hospitalized 11/4-11/12 with acute renal failure superimposed on chronic kidney disease in the setting of acute on chronic hypoxic respiratory failure secondary to CHF and severe aortic stenosis.  During that admission she was treated with Lasix 80 mg IV twice daily with improvement in symptoms.  Evaluated by palliative care with confirmation of DNR status, and cardiac was not thought to be a candidate for hemodialysis or TAVR in her current condition.  After getting home patient had 1 good day in thereafter had a flare of arthritis for which she was placed on a steroid taper with some improvement.  She had initially been off of oxygen, but have been requiring it thereafter.  She is getting around using a walker, but had reported being more short of breath with exertion.  Noted associated symptoms of progressively worsening lower extremity swelling, abdominal swelling, dark diarrhea stools for the last 3 days, and decreased urine output.  She had been advised by her cardiologist Dr. Aundra Dubin to increased furosemide to 80 mg twice daily which she had done for the last day or so but had not noticed provement.  Her birthday was yesterday and son  notes that she did not even want to eat the banana pudding that family made for her and ended up calling EMS due to her symptoms.  She reports that she had not peed for 3 days prior to this morning at 6 AM.  She does not feel that she is ready to be on hospice at this time, because they discontinue all of her meds and would like to continue with therapies as long as they are helping. In route with EMS patient was noted to have O2 saturations as low as 88% room air  Hospital Course / Discharge diagnoses: Principal problem Acute on chronic hypoxic respiratory failure due to pulmonary hypertension, RV failure, acute on chronic diastolic CHF-patient was admitted to the hospital with shortness of breath and progressive swelling.  She was significantly fluid overloaded on admission, and chest x-ray showed cardiomegaly with vascular congestion, edema and bilateral pleural effusions.  Cardiology consulted and followed patient while hospitalized, Dr. Aundra Dubin had extensive discussion with the patient and at this point all medical avenues have been exhausted and recommendations were for hospice to which patient is agreeable.  She will be discharged to [place].  Per patient preference, she will continue her home medications for pulmonary hypertension  Active problems Acute kidney injury on chronic kidney disease stage IV-metabolic acidosis-she was initially placed on Lasix drip, creatinine appears to be worsening.  Discontinue metformin on discharge.  Now comfort care Hyperkalemia-hold potassium on discharge Essential hypertension Type 2 diabetes mellitus Iron deficiency anemia, chronic blood loss Hyperlipidemia Hypothyroidism Hypocalcemia  Sepsis ruled out   Discharge Instructions   Allergies as of 08/12/2021  Reactions   Other Palpitations   Surgical metal (stents, pins, plates) keeps pt from healing   Adhesive [tape] Other (See Comments)   Irritates skin with rash   Macitentan Other (See  Comments)   Lower extremity edema    Nickel Rash        Medication List     STOP taking these medications    cholecalciferol 25 MCG (1000 UNIT) tablet Commonly known as: VITAMIN D   ferrous sulfate 325 (65 FE) MG EC tablet   metFORMIN 500 MG tablet Commonly known as: GLUCOPHAGE   potassium chloride 10 MEQ tablet Commonly known as: KLOR-CON   PRESERVISION AREDS 2 PO       TAKE these medications    Accu-Chek Aviva Plus test strip Generic drug: glucose blood Check blood sugar TID dx: E11.9   Adempas 2.5 MG Tabs Generic drug: Riociguat TAKE 1 TABLET THREE TIMES A DAY What changed: how much to take   atorvastatin 20 MG tablet Commonly known as: LIPITOR TAKE 1 TABLET BY MOUTH IN  THE MORNING What changed: when to take this   cetirizine 10 MG tablet Commonly known as: ZYRTEC Take 10 mg by mouth daily as needed for allergies.   fenofibrate 160 MG tablet Take 1 tablet (160 mg total) by mouth every morning.   levothyroxine 125 MCG tablet Commonly known as: SYNTHROID Take 1 tablet (125 mcg total) by mouth daily before breakfast.   loperamide 2 MG tablet Commonly known as: IMODIUM A-D Take 2 mg by mouth daily as needed for diarrhea or loose stools.   loratadine 10 MG tablet Commonly known as: CLARITIN Take 10 mg by mouth daily as needed for allergies.   metolazone 2.5 MG tablet Commonly known as: ZAROXOLYN Take 1 tablet (2.5 mg total) by mouth once a week. Take potassium 41meq with this What changed: additional instructions   metoprolol succinate 25 MG 24 hr tablet Commonly known as: Toprol XL Take 1 tablet (25 mg total) by mouth 2 (two) times daily.   pantoprazole 40 MG tablet Commonly known as: PROTONIX TAKE 1 TABLET BY MOUTH  TWICE DAILY What changed: how to take this   torsemide 20 MG tablet Commonly known as: DEMADEX Take 80 mg by mouth 2 (two) times daily.   Uptravi 1600 MCG Tabs Generic drug: Selexipag Take 1 tablet (1,600 mcg total) by  mouth 2 (two) times daily. Take with 800 for a total of 2400 mg What changed:  when to take this additional instructions   Uptravi 800 MCG Tabs Generic drug: Selexipag Take 1 tablet (800 mcg total) by mouth 2 (two) times daily. Take with 1600 mcg for a total of 2400 mg What changed: additional instructions       Consultations: Cardiology - heart failure  Procedures/Studies:  DG Chest 2 View  Result Date: 08/10/2021 CLINICAL DATA:  Shortness of breath EXAM: CHEST - 2 VIEW COMPARISON:  07/18/2021, CT 02/17/2021 FINDINGS: Cardiomegaly with vascular congestion, pulmonary edema and small right greater than left pleural effusions. Airspace disease at right base likely atelectasis. IMPRESSION: Cardiomegaly with vascular congestion, edema and small right greater than left pleural effusions. Electronically Signed   By: Donavan Foil M.D.   On: 08/10/2021 19:28   DG Chest 2 View  Result Date: 07/16/2021 CLINICAL DATA:  Shortness of breath and decreasing urinary function EXAM: CHEST - 2 VIEW COMPARISON:  01/26/2021, CT from 02/15/2021 FINDINGS: Cardiac shadow is mildly prominent. Aortic calcifications are noted. The lungs are well aerated bilaterally. Minimal pleural  effusions are seen bilaterally. No acute bony abnormality is seen. Known bilateral pulmonary nodules are not well appreciated on this exam. IMPRESSION: Small pleural effusions bilaterally. Known pulmonary nodules are not well appreciated. Electronically Signed   By: Inez Catalina M.D.   On: 07/16/2021 20:38   CARDIAC CATHETERIZATION  Result Date: 07/19/2021 1. Mild to moderately elevated right and left heart filling pressures. 2. Preserved cardiac output. 3. Severe primarily pulmonary arterial hypertension. 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   DG CHEST PORT 1 VIEW  Result Date: 07/18/2021 CLINICAL DATA:  Dyspnea EXAM: PORTABLE CHEST 1 VIEW COMPARISON:  July 16, 2021 FINDINGS: Enlarged cardiac silhouette.  Tortuosity and calcific atherosclerotic disease of the aorta. Streaky bilateral airspace opacities with lower lobe predominance. Possible bilateral pleural effusions, small. Osseous structures are without acute abnormality. Soft tissues are grossly normal. IMPRESSION: 1. Streaky bilateral airspace opacities and increased interstitial markings may represent pulmonary edema, with lower lobe atelectasis and small pleural effusions. 2. Enlarged cardiac silhouette. Electronically Signed   By: Fidela Salisbury M.D.   On: 07/18/2021 12:12   ECHOCARDIOGRAM COMPLETE  Result Date: 07/18/2021    ECHOCARDIOGRAM REPORT   Patient Name:   ELAYA DROEGE [facility] Date of Exam: 07/18/2021 Medical Rec #:  [id number]      Height:       65.0 in Accession #:    [id number]     Weight:       155.5 lb Date of Birth:  [birth year]     BSA:          1.777 m Patient Age:    78 years       BP:           135/69 mmHg Patient Gender: F              HR:           95 bpm. Exam Location:  Inpatient Procedure: 2D Echo, Color Doppler and Cardiac Doppler Indications:    Dyspnea  History:        Patient has prior history of Echocardiogram examinations, most                 recent 01/07/2021. CHF, CAD, Pulmonary HTN; Risk                 Factors:Diabetes.  Sonographer:    Bernadene Person RDCS Referring Phys: [place]  1. Left ventricular ejection fraction, by estimation, is >75%. The left ventricle has hyperdynamic function. The left ventricle has no regional wall motion abnormalities. There is moderate left ventricular hypertrophy. Left ventricular diastolic parameters are consistent with Grade II diastolic dysfunction (pseudonormalization). Elevated left atrial pressure.  2. The ventricular septum is flattened in systole and diastole consistent with RV pressure and volume overload. Relatively preserved RV anular vertical motion however decreased free wall contraction in the horizontal plane, TAPSE and S' likely poor indicators for  RV function. . Right ventricular systolic function is moderately reduced. The right ventricular size is moderately enlarged. Tricuspid regurgitation signal is inadequate for assessing PA pressure.  3. Left atrial size was moderately dilated.  4. A small pericardial effusion is present. The pericardial effusion is circumferential.  5. The mitral valve is abnormal. Mild to moderate mitral valve regurgitation. Moderate to severe mitral stenosis. Moderate mitral annular calcification. The mean mitral valve gradient is 10.0 mmHg.  6. Tricuspid valve regurgitation is mild to moderate.  7. The aortic valve is tricuspid. There is  moderate calcification of the aortic valve. There is moderate thickening of the aortic valve. Aortic valve regurgitation is mild. Moderate aortic valve stenosis.  8. The inferior vena cava is normal in size with <50% respiratory variability, suggesting right atrial pressure of 8 mmHg. FINDINGS  Left Ventricle: Left ventricular ejection fraction, by estimation, is >75%. The left ventricle has hyperdynamic function. The left ventricle has no regional wall motion abnormalities. The left ventricular internal cavity size was normal in size. There is moderate left ventricular hypertrophy. Left ventricular diastolic parameters are consistent with Grade II diastolic dysfunction (pseudonormalization). Elevated left atrial pressure. Right Ventricle: The ventricular septum is flattened in systole and diastole consistent with RV pressure and volume overload. Relatively preserved RV anular vertical motion however decreased free wall contraction in the horizontal plane, TAPSE and S' likely poor indicators for RV function. The right ventricular size is moderately enlarged. Right vetricular wall thickness was not well visualized. Right ventricular systolic function is moderately reduced. Tricuspid regurgitation signal is inadequate for assessing PA pressure. Left Atrium: Left atrial size was moderately dilated.  Right Atrium: Right atrial size was not well visualized. Pericardium: A small pericardial effusion is present. The pericardial effusion is circumferential. Mitral Valve: The mitral valve is abnormal. There is moderate thickening of the mitral valve leaflet(s). There is moderate calcification of the mitral valve leaflet(s). Moderate mitral annular calcification. Mild to moderate mitral valve regurgitation. Moderate to severe mitral valve stenosis. The mean mitral valve gradient is 10.0 mmHg. Tricuspid Valve: The tricuspid valve is not well visualized. Tricuspid valve regurgitation is mild to moderate. No evidence of tricuspid stenosis. Aortic Valve: The aortic valve is tricuspid. There is moderate calcification of the aortic valve. There is moderate thickening of the aortic valve. There is moderate aortic valve annular calcification. Aortic valve regurgitation is mild. Aortic regurgitation PHT measures 314 msec. Moderate aortic stenosis is present. Aortic valve mean gradient measures 27.6 mmHg. Aortic valve peak gradient measures 44.8 mmHg. Aortic valve area, by VTI measures 1.11 cm. Pulmonic Valve: The pulmonic valve was not well visualized. Pulmonic valve regurgitation is not visualized. No evidence of pulmonic stenosis. Aorta: The aortic root is normal in size and structure. Venous: The inferior vena cava is normal in size with less than 50% respiratory variability, suggesting right atrial pressure of 8 mmHg. IAS/Shunts: The interatrial septum was not well visualized.  LEFT VENTRICLE PLAX 2D LVIDd:         3.80 cm   Diastology LVIDs:         2.50 cm   LV e' medial:    4.45 cm/s LV PW:         1.30 cm   LV E/e' medial:  48.5 LV IVS:        1.20 cm   LV e' lateral:   5.35 cm/s LVOT diam:     1.90 cm   LV E/e' lateral: 40.4 LV SV:         82 LV SV Index:   46 LVOT Area:     2.84 cm  RIGHT VENTRICLE RV S prime:     11.20 cm/s TAPSE (M-mode): 1.6 cm LEFT ATRIUM             Index        RIGHT ATRIUM           Index LA  diam:        5.10 cm 2.87 cm/m   RA Area:     14.60 cm LA Vol (A2C):  74.9 ml 42.14 ml/m  RA Volume:   34.40 ml  19.35 ml/m LA Vol (A4C):   78.0 ml 43.88 ml/m LA Biplane Vol: 75.8 ml 42.64 ml/m  AORTIC VALVE AV Area (Vmax):    1.17 cm AV Area (Vmean):   1.04 cm AV Area (VTI):     1.11 cm AV Vmax:           334.80 cm/s AV Vmean:          247.600 cm/s AV VTI:            0.740 m AV Peak Grad:      44.8 mmHg AV Mean Grad:      27.6 mmHg LVOT Vmax:         138.00 cm/s LVOT Vmean:        91.000 cm/s LVOT VTI:          0.290 m LVOT/AV VTI ratio: 0.39 AI PHT:            314 msec  AORTA Ao Root diam: 3.30 cm Ao Asc diam:  3.20 cm MITRAL VALVE MV Area (PHT): 3.65 cm     SHUNTS MV Mean grad:  10.0 mmHg    Systemic VTI:  0.29 m MV Decel Time: 208 msec     Systemic Diam: 1.90 cm MV E velocity: 216.00 cm/s MV A velocity: 145.00 cm/s MV E/A ratio:  1.49 Carlyle Dolly MD Electronically signed by Carlyle Dolly MD Signature Date/Time: 07/18/2021/3:50:11 PM    Final    CT Renal Stone Study  Result Date: 07/16/2021 CLINICAL DATA:  Acute renal failure. EXAM: CT ABDOMEN AND PELVIS WITHOUT CONTRAST TECHNIQUE: Multidetector CT imaging of the abdomen and pelvis was performed following the standard protocol without IV contrast. COMPARISON:  None. FINDINGS: Lower chest: Mild atelectasis is seen within the bilateral lung bases. Small bilateral pleural effusions are noted, right greater than left. Hepatobiliary: No focal liver abnormality is seen. Subcentimeter gallstones are seen within the lumen of an otherwise normal-appearing gallbladder. There is no evidence of biliary dilatation. Pancreas: Unremarkable. No pancreatic ductal dilatation or surrounding inflammatory changes. Spleen: Normal in size without focal abnormality. Adrenals/Urinary Tract: Adrenal glands are unremarkable. Kidneys are normal in size, without renal calculi or hydronephrosis. 7 mm and 3 mm foci of fat density are noted within the mid right kidney. A  small amount of air is seen within the lumen of the urinary bladder. A very mild amount of surrounding inflammatory fat stranding is seen without evidence of urinary bladder wall thickening Stomach/Bowel: There is a small hiatal hernia. The appendix is surgically absent. No evidence of bowel wall thickening, distention, or inflammatory changes. Noninflamed diverticula are seen throughout the large bowel. Vascular/Lymphatic: Aortic atherosclerosis. No enlarged abdominal or pelvic lymph nodes. Reproductive: Uterus and bilateral adnexa are unremarkable. Other: No abdominal wall hernia or abnormality. No abdominopelvic ascites. Musculoskeletal: Multilevel degenerative changes seen throughout the lumbar spine, most prominent at the levels of L4-L5 and L5-S1. IMPRESSION: 1. Findings involving the urinary bladder which may represent sequelae associated with cystitis. Correlation with urinalysis is recommended. 2. Small bilateral pleural effusions, right greater than left. 3. Cholelithiasis. 4. Colonic diverticulosis. 5. Small hiatal hernia. 6. Aortic atherosclerosis. Aortic Atherosclerosis (ICD10-I70.0). Electronically Signed   By: Virgina Norfolk M.D.   On: 07/16/2021 20:58   ECHO TEE  Result Date: 07/21/2021    TRANSESOPHOGEAL ECHO REPORT   Patient Name:   KALISI BEVILL [facility] Date of Exam: 07/21/2021 Medical Rec #:  [id number]  Height:       65.0 in Accession #:    [id number]     Weight:       151.2 lb Date of Birth:  1941/03/01     BSA:          1.757 m Patient Age:    32 years       BP:           105/60 mmHg Patient Gender: F              HR:           70 bpm. Exam Location:  Inpatient Procedure: Transesophageal Echo, Color Doppler, 3D Echo and Cardiac Doppler Indications:    AS, MS  History:        Patient has prior history of Echocardiogram examinations.  Sonographer:    Dustin Flock RDCS Referring Phys: [phone] Wilmer Floor SIMMONS PROCEDURE: The transesophogeal probe was passed without difficulty through the  esophogus of the patient. Sedation performed by different physician. The patient developed no complications during the procedure. IMPRESSIONS  1. Left ventricular ejection fraction, by estimation, is 60 to 65%. The left ventricle has normal function. The left ventricle has no regional wall motion abnormalities. There is mild left ventricular hypertrophy.  2. Peak RV-RA gradient 40 mmHg. Right ventricular systolic function is mildly reduced. The right ventricular size is moderately enlarged.  3. Left atrial size was moderately dilated. No left atrial/left atrial appendage thrombus was detected.  4. Right atrial size was moderately dilated.  5. 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 Mild to moderate mitral valve regurgitation. Moderate mitral stenosis. The mean mitral valve gradient is 6.0 mmHg with MVA 1.3 cm^2 by VTI. Moderate to severe mitral annular calcification.  6. The aortic valve is tricuspid. Aortic valve regurgitation is mild. Severe aortic valve stenosis. Aortic valve area, by VTI measures 0.87 cm. Aortic valve mean gradient measures 46.0 mmHg.  7. A small pericardial effusion is present. FINDINGS  Left Ventricle: Left ventricular ejection fraction, by estimation, is 60 to 65%. The left ventricle has normal function. The left ventricle has no regional wall motion abnormalities. The left ventricular internal cavity size was normal in size. There is  mild left ventricular hypertrophy. Right Ventricle: Peak RV-RA gradient 40 mmHg. The right ventricular size is moderately enlarged. No increase in right ventricular wall thickness. Right ventricular systolic function is mildly reduced. Left Atrium: Left atrial size was moderately dilated. No left atrial/left atrial appendage thrombus was detected. Right Atrium: Right atrial size was moderately dilated. Pericardium: A small pericardial effusion is present. Mitral Valve: The mitral valve is rheumatic. There is moderate calcification of the mitral  valve leaflet(s). Moderate to severe mitral annular calcification. Mild to moderate mitral valve regurgitation. Moderate mitral valve stenosis. MV peak gradient, 16.2 mmHg. The mean mitral valve gradient is 6.0 mmHg. Tricuspid Valve: The tricuspid valve is normal in structure. Tricuspid valve regurgitation is mild. Aortic Valve: The aortic valve is tricuspid. Aortic valve regurgitation is mild. Severe aortic stenosis is present. Aortic valve mean gradient measures 46.0 mmHg. Aortic valve peak gradient measures 74.1 mmHg. Aortic valve area, by VTI measures 0.87 cm. Pulmonic Valve: The pulmonic valve was normal in structure. Pulmonic valve regurgitation is not visualized. Aorta: The aortic root is normal in size and structure. IAS/Shunts: No atrial level shunt detected by color flow Doppler.  LEFT VENTRICLE PLAX 2D LVOT diam:     2.00 cm LV SV:  83 LV SV Index:   47 LVOT Area:     3.14 cm  AORTIC VALVE AV Area (Vmax):    0.89 cm AV Area (Vmean):   0.80 cm AV Area (VTI):     0.87 cm AV Vmax:           430.50 cm/s AV Vmean:          303.500 cm/s AV VTI:            0.953 m AV Peak Grad:      74.1 mmHg AV Mean Grad:      46.0 mmHg LVOT Vmax:         122.00 cm/s LVOT Vmean:        76.900 cm/s LVOT VTI:          0.264 m LVOT/AV VTI ratio: 0.28 MITRAL VALVE               TRICUSPID VALVE MV Area (PHT): 1.83 cm    TR Peak grad:   39.7 mmHg MV Area VTI:   1.30 cm    TR Vmax:        315.00 cm/s MV Peak grad:  16.2 mmHg MV Mean grad:  6.0 mmHg    SHUNTS MV Vmax:       2.01 m/s    Systemic VTI:  0.26 m MV Vmean:      113.3 cm/s  Systemic Diam: 2.00 cm Dalton McleanMD Electronically signed by Franki Monte Signature Date/Time: 07/21/2021/2:35:24 PM    Final      Subjective: - no chest pain, shortness of breath, no abdominal pain, nausea or vomiting.   Discharge Exam: BP (!) 120/55   Pulse 83   Temp 97.6 F (36.4 C) (Oral)   Resp 18   SpO2 95%   General: Pt is alert, awake, not in acute  distress Cardiovascular: RRR, S1/S2 +, no rubs, no gallops Respiratory: CTA bilaterally, no wheezing, no rhonchi Abdominal: Soft, NT, ND, bowel sounds + Extremities: no edema, no cyanosis   The results of significant diagnostics from this hospitalization (including imaging, microbiology, ancillary and laboratory) are listed below for reference.     Microbiology: Recent Results (from the past 240 hour(s))  Resp Panel by RT-PCR (Flu A&B, Covid) Nasopharyngeal Swab     Status: None   Collection Time: 08/11/21  8:28 AM   Specimen: Nasopharyngeal Swab; Nasopharyngeal(NP) swabs in vial transport medium  Result Value Ref Range Status   SARS Coronavirus 2 by RT PCR NEGATIVE NEGATIVE Final    Comment: (NOTE) SARS-CoV-2 target nucleic acids are NOT DETECTED.  The SARS-CoV-2 RNA is generally detectable in upper respiratory specimens during the acute phase of infection. The lowest concentration of SARS-CoV-2 viral copies this assay can detect is 138 copies/mL. A negative result does not preclude SARS-Cov-2 infection and should not be used as the sole basis for treatment or other patient management decisions. A negative result may occur with  improper specimen collection/handling, submission of specimen other than nasopharyngeal swab, presence of viral mutation(s) within the areas targeted by this assay, and inadequate number of viral copies(<138 copies/mL). A negative result must be combined with clinical observations, patient history, and epidemiological information. The expected result is Negative.  Fact Sheet for Patients:  EntrepreneurPulse.com.au  Fact Sheet for Healthcare Providers:  IncredibleEmployment.be  This test is no t yet approved or cleared by the [place] FDA and  has been authorized for detection and/or diagnosis of SARS-CoV-2 by FDA under an Emergency Use Authorization (  EUA). This EUA will remain  in effect (meaning this test can  be used) for the duration of the COVID-19 declaration under Section 564(b)(1) of the Act, 21 U.S.C.section 360bbb-3(b)(1), unless the authorization is terminated  or revoked sooner.       Influenza A by PCR NEGATIVE NEGATIVE Final   Influenza B by PCR NEGATIVE NEGATIVE Final    Comment: (NOTE) The Xpert Xpress SARS-CoV-2/FLU/RSV plus assay is intended as an aid in the diagnosis of influenza from Nasopharyngeal swab specimens and should not be used as a sole basis for treatment. Nasal washings and aspirates are unacceptable for Xpert Xpress SARS-CoV-2/FLU/RSV testing.  Fact Sheet for Patients: EntrepreneurPulse.com.au  Fact Sheet for Healthcare Providers: IncredibleEmployment.be  This test is not yet approved or cleared by the [place] FDA and has been authorized for detection and/or diagnosis of SARS-CoV-2 by FDA under an Emergency Use Authorization (EUA). This EUA will remain in effect (meaning this test can be used) for the duration of the COVID-19 declaration under Section 564(b)(1) of the Act, 21 U.S.C. section 360bbb-3(b)(1), unless the authorization is terminated or revoked.  Performed at [facility], [place] [address] [place], [place] [address]      Labs: Basic Metabolic Panel: Recent Labs  Lab 08/10/21 1814 08/11/21 0830 08/12/21 0747  NA 136 135 137  K 5.7* 5.0 4.6  CL 109 109 109  CO2 14* 13* 16*  GLUCOSE 198* 140* 131*  BUN 77* 81* 82*  CREATININE 5.43* 5.38* 5.66*  CALCIUM 8.2* 8.2* 7.8*   Liver Function Tests: Recent Labs  Lab 08/10/21 1814  AST 10*  ALT 6  ALKPHOS 57  BILITOT 0.6  PROT 6.4*  ALBUMIN 3.3*   CBC: Recent Labs  Lab 08/10/21 1814 08/11/21 0830  WBC 9.6 7.2  HGB 8.9* 9.4*  HCT 30.6* 31.1*  MCV 89.5 88.9  PLT 412* 379   CBG: No results for input(s): GLUCAP in the last 168 hours. Hgb A1c No results for input(s): HGBA1C in the last 72 hours. Lipid Profile No results for  input(s): CHOL, HDL, LDLCALC, TRIG, CHOLHDL, LDLDIRECT in the last 72 hours. Thyroid function studies No results for input(s): TSH, T4TOTAL, T3FREE, THYROIDAB in the last 72 hours.  Invalid input(s): FREET3 Urinalysis    Component Value Date/Time   COLORURINE YELLOW 08/11/2021 [place] 08/11/2021 1449   LABSPEC 1.025 08/11/2021 1449   PHURINE 5.5 08/11/2021 [place] 08/11/2021 1449   HGBUR NEGATIVE 08/11/2021 [place] 08/11/2021 1449   KETONESUR NEGATIVE 08/11/2021 1449   PROTEINUR NEGATIVE 08/11/2021 1449   NITRITE NEGATIVE 08/11/2021 1449   LEUKOCYTESUR MODERATE (A) 08/11/2021 1449    FURTHER DISCHARGE INSTRUCTIONS:   Get Medicines reviewed and adjusted: Please take all your medications with you for your next visit with your Primary MD   Laboratory/radiological data: Please request your Primary MD to go over all hospital tests and procedure/radiological results at the follow up, please ask your Primary MD to get all Hospital records sent to his/her office.   In some cases, they will be blood work, cultures and biopsy results pending at the time of your discharge. Please request that your primary care M.D. goes through all the records of your hospital data and follows up on these results.   Also Note the following: If you experience worsening of your admission symptoms, develop shortness of breath, life threatening emergency, suicidal or homicidal thoughts you must seek medical attention immediately by calling  [phone] or calling your MD immediately  if symptoms less severe.   You must read complete instructions/literature along with all the possible adverse reactions/side effects for all the Medicines you take and that have been prescribed to you. Take any new Medicines after you have completely understood and accpet all the possible adverse reactions/side effects.    Do not drive when taking Pain medications or sleeping medications  (Benzodaizepines)   Do not take more than prescribed Pain, Sleep and Anxiety Medications. It is not advisable to combine anxiety,sleep and pain medications without talking with your primary care practitioner   Special Instructions: If you have smoked or chewed Tobacco  in the last 2 yrs please stop smoking, stop any regular Alcohol  and or any Recreational drug use.   Wear Seat belts while driving.   Please note: You were cared for by a hospitalist during your hospital stay. Once you are discharged, your primary care physician will handle any further medical issues. Please note that NO REFILLS for any discharge medications will be authorized once you are discharged, as it is imperative that you return to your primary care physician (or establish a relationship with a primary care physician if you do not have one) for your post hospital discharge needs so that they can reassess your need for medications and monitor your lab values.  Time coordinating discharge: 40 minutes  SIGNED:  Marzetta Board, MD, PhD 08/12/2021, 10:33 AM

## 2021-08-12 NOTE — Care Management (Addendum)
Addendum Patient waiting on [facility] for transport to [facility] in [facility]

## 2021-08-19 ENCOUNTER — Telehealth (HOSPITAL_COMMUNITY): Payer: Self-pay | Admitting: *Deleted

## 2021-08-19 NOTE — Telephone Encounter (Signed)
Dr.Amy with Hospice requested a call back directly from Dr. Aundra Dubin. Dr.McLean aware and given call back number.

## 2021-08-25 ENCOUNTER — Telehealth (HOSPITAL_COMMUNITY): Payer: Self-pay | Admitting: Pharmacist

## 2021-08-25 ENCOUNTER — Encounter (HOSPITAL_COMMUNITY): Payer: Self-pay | Admitting: Cardiology

## 2021-08-25 ENCOUNTER — Encounter (HOSPITAL_COMMUNITY): Payer: Medicare Other | Admitting: Cardiology

## 2021-08-25 NOTE — Telephone Encounter (Signed)
Patient Advocate Encounter   Received notification from [facility] that prior authorization for Adempas and [name] are required.   PA submitted on CoverMyMeds Status is pending   Will continue to follow.  Audry Riles, PharmD, BCPS, BCCP, CPP Heart Failure Clinic Pharmacist [phone]

## 2021-08-26 NOTE — Telephone Encounter (Signed)
Advanced Heart Failure Patient Advocate Encounter  Prior Authorizations for Uptravi and Adempas have been approved.    Effective dates: 09/12/20 through 09/11/22  Audry Riles, PharmD, BCPS, BCCP, CPP Heart Failure Clinic Pharmacist [phone]

## 2021-09-02 ENCOUNTER — Ambulatory Visit: Payer: Medicare Other | Admitting: Medical

## 2021-10-02 IMAGING — DX DG ANKLE COMPLETE 3+V*L*
3 series · 3 of 3 positions shown · non-contrast
Comparison: None.

CLINICAL DATA: Chronic left ankle pain without known injury.

EXAM:
LEFT ANKLE COMPLETE - 3+ VIEW

[ankle obl]
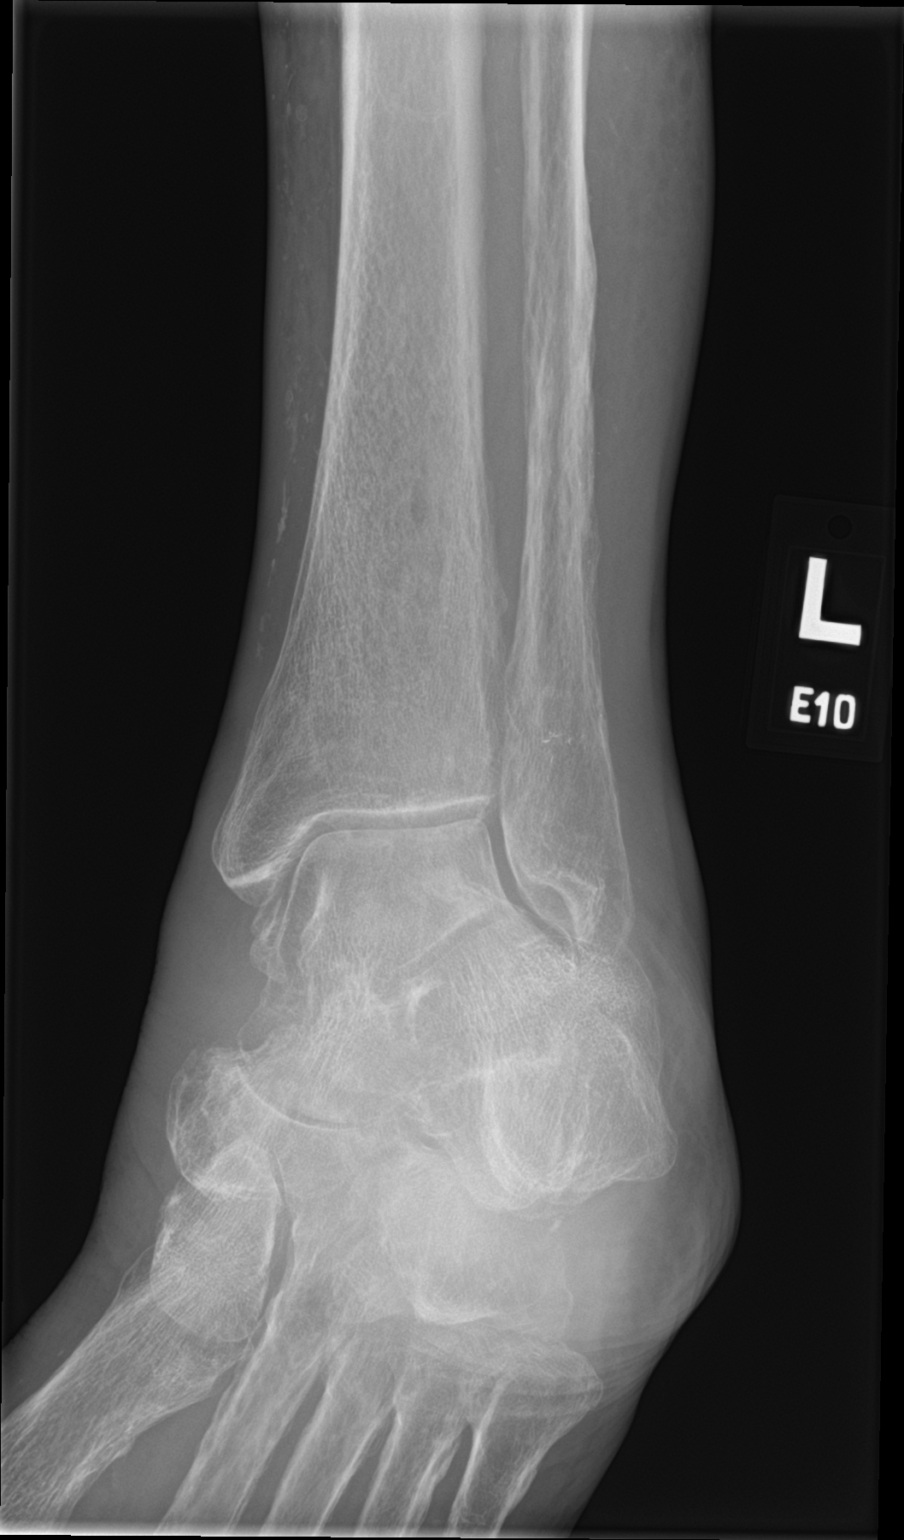

[ankle lat]
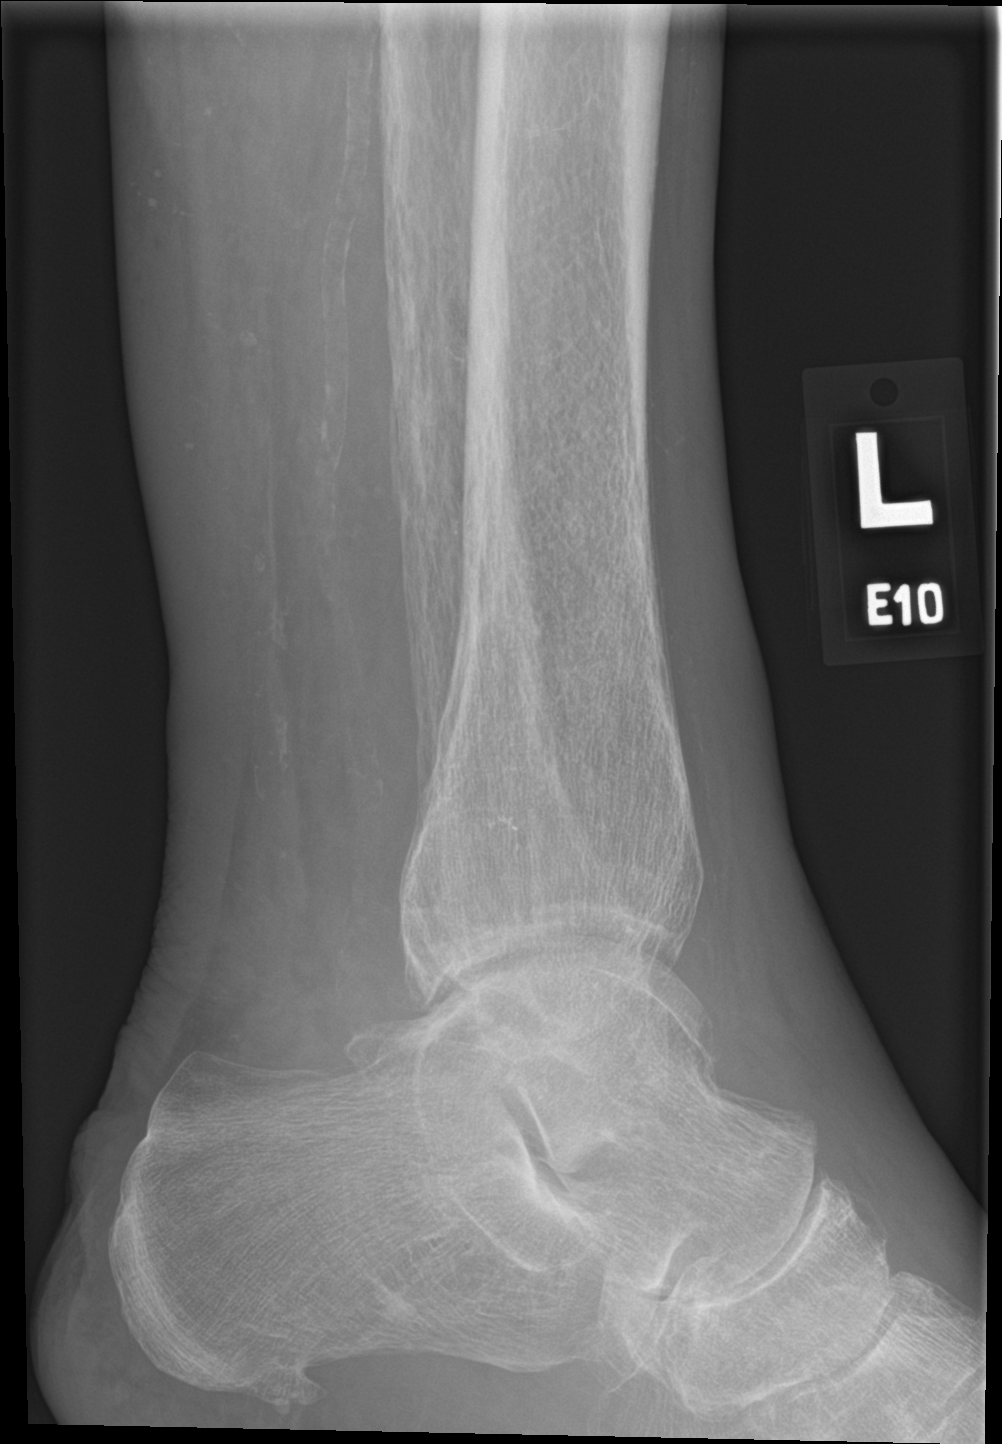

[ankle ap]
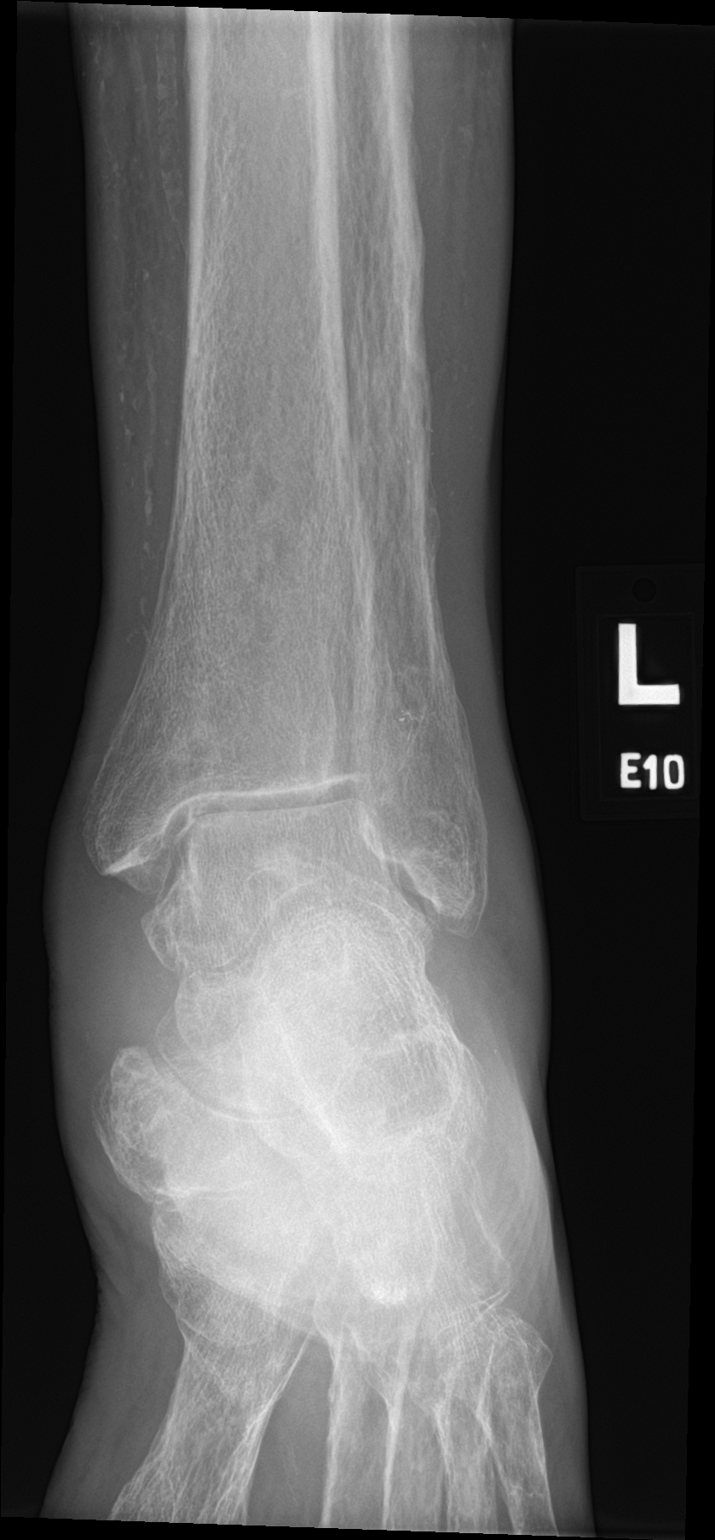

[3 of 3 positions shown; findings below may reference images not displayed]

FINDINGS: There is no evidence of acute fracture, dislocation, or joint
effusion. There is no evidence of arthropathy or other focal bone
abnormality. Vascular calcifications are noted.
IMPRESSION: No acute abnormality is noted.

## 2021-10-13 DEATH — deceased

## 2021-12-07 ENCOUNTER — Ambulatory Visit: Payer: Medicare Other | Admitting: Dermatology

## 2022-01-02 IMAGING — CR DG CHEST 2V
2 series · 2 of 2 positions shown · non-contrast
Comparison: 07/18/2021, CT 02/17/2021

CLINICAL DATA: Shortness of breath

EXAM:
CHEST - 2 VIEW

[chest lat]
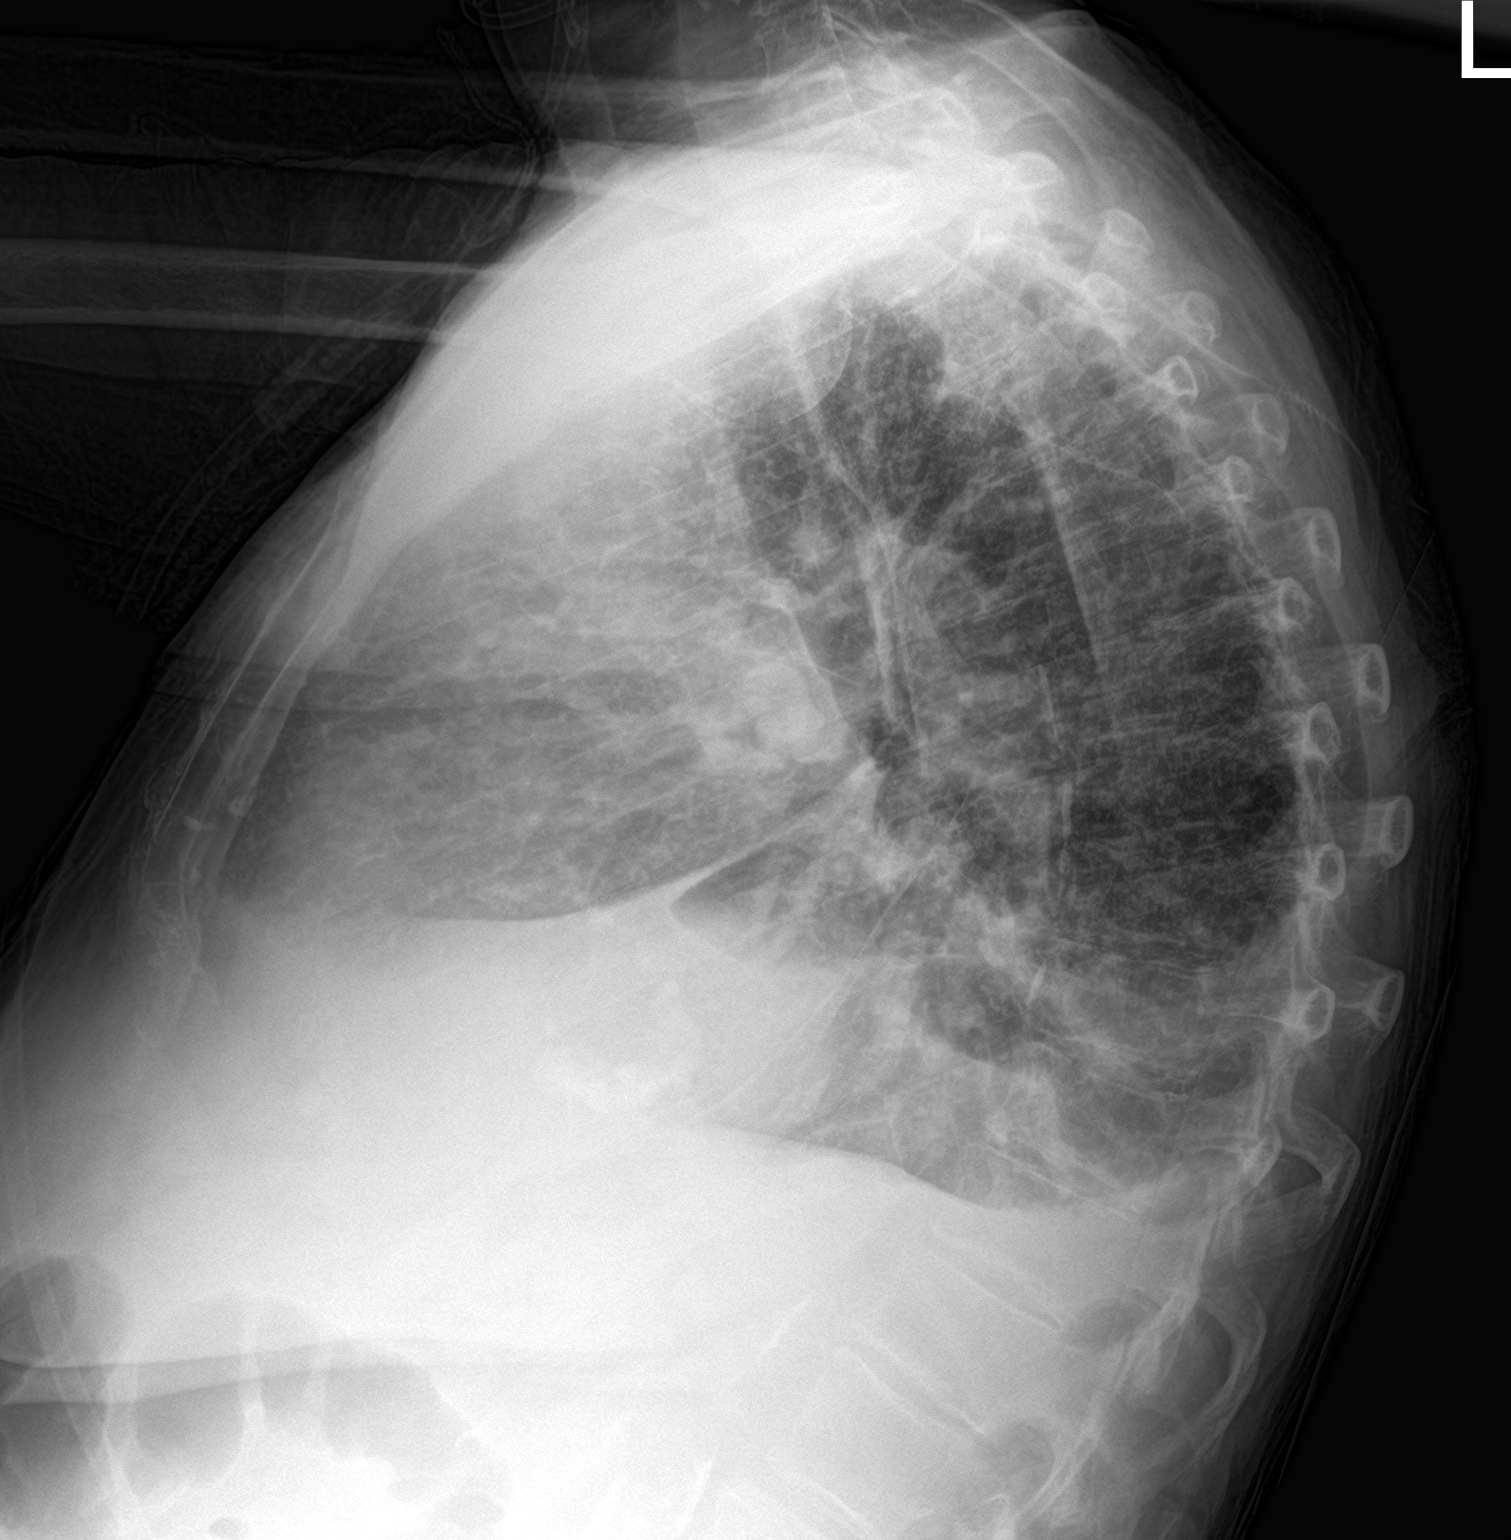

[chest ap]
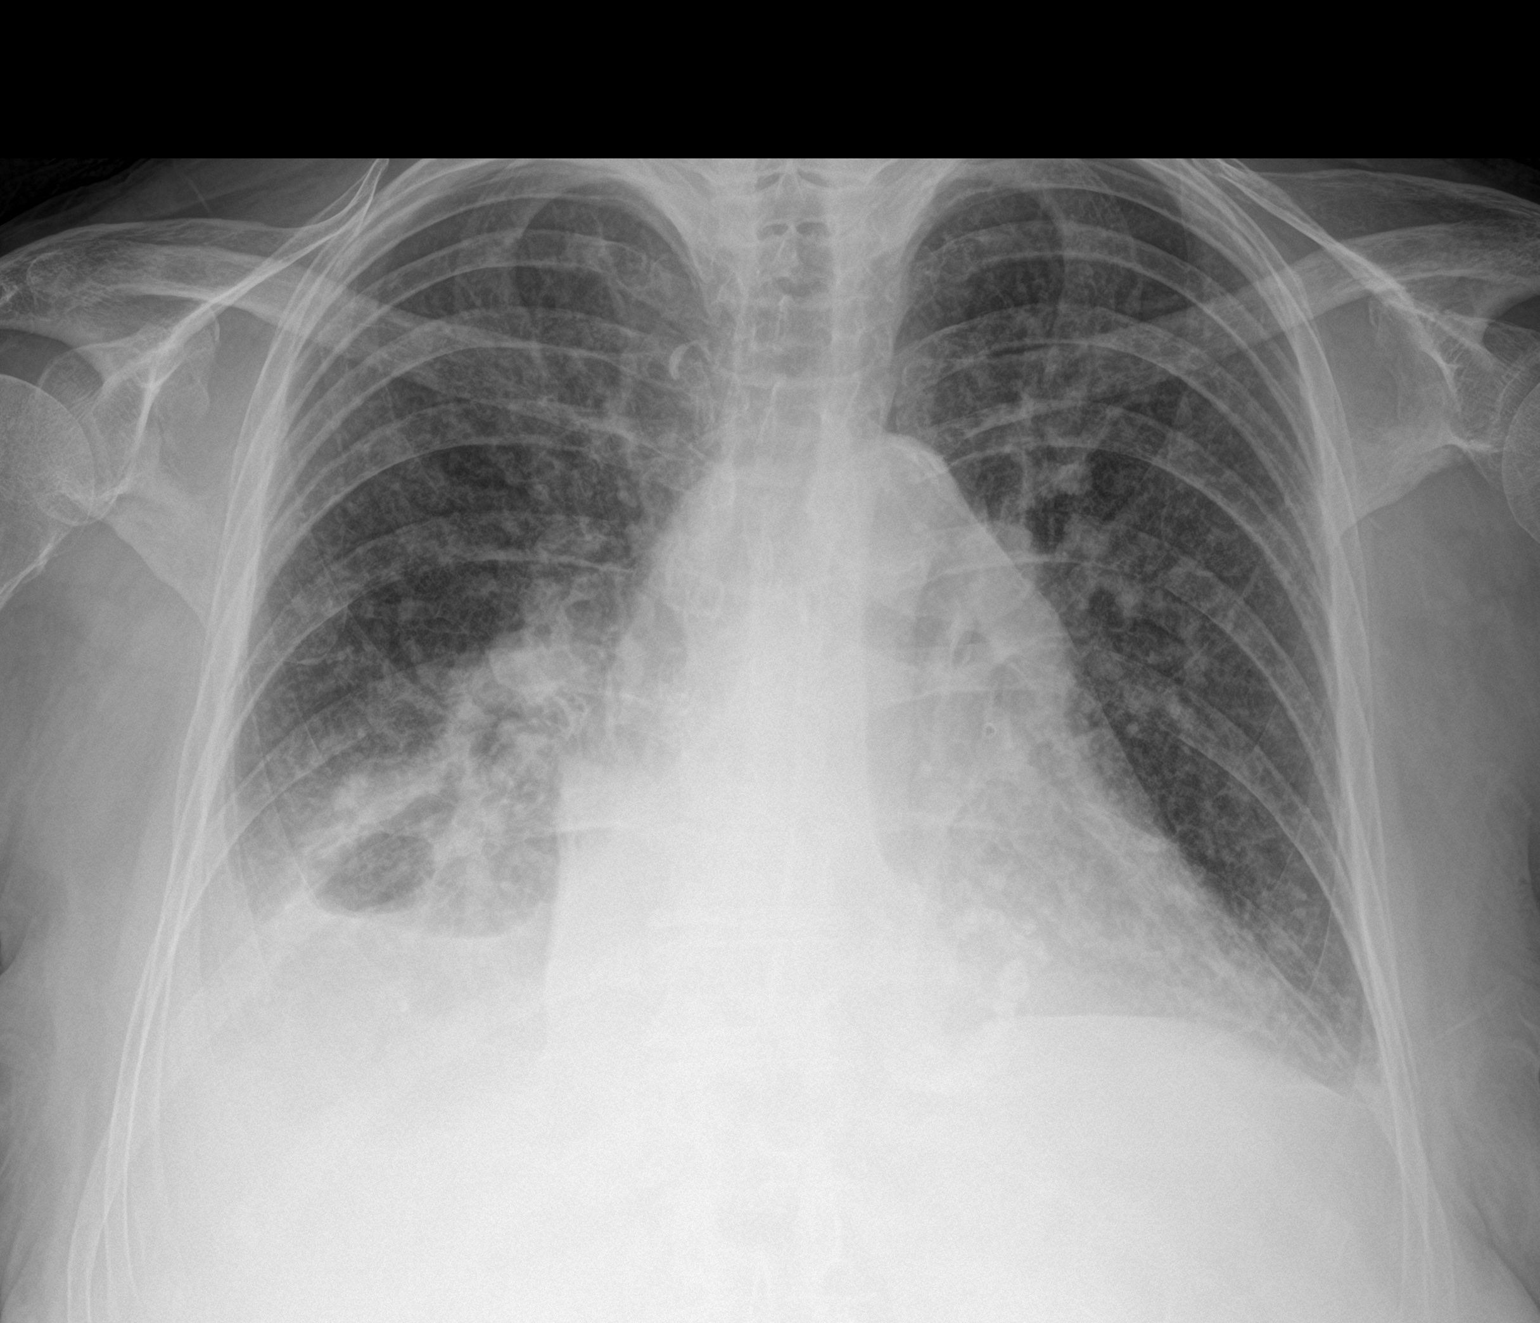

[2 of 2 positions shown; findings below may reference images not displayed]

FINDINGS: Cardiomegaly with vascular congestion, pulmonary edema and small
right greater than left pleural effusions. Airspace disease at right
base likely atelectasis.
IMPRESSION: Cardiomegaly with vascular congestion, edema and small right greater
than left pleural effusions.

## 2024-05-09 ENCOUNTER — Other Ambulatory Visit (HOSPITAL_BASED_OUTPATIENT_CLINIC_OR_DEPARTMENT_OTHER): Payer: Self-pay
